# Patient Record
Sex: Female | Born: 1950
Health system: Southern US, Community
[De-identification: ages and names within clinical notes are randomized; demographics above are authoritative.]

## PROBLEM LIST (undated history)

## (undated) DIAGNOSIS — R0602 Shortness of breath: Secondary | ICD-10-CM

## (undated) DIAGNOSIS — M720 Palmar fascial fibromatosis [Dupuytren]: Secondary | ICD-10-CM

## (undated) DIAGNOSIS — R05 Cough: Secondary | ICD-10-CM

## (undated) DIAGNOSIS — F419 Anxiety disorder, unspecified: Secondary | ICD-10-CM

## (undated) DIAGNOSIS — H3530 Unspecified macular degeneration: Secondary | ICD-10-CM

## (undated) DIAGNOSIS — T7840XA Allergy, unspecified, initial encounter: Secondary | ICD-10-CM

## (undated) DIAGNOSIS — K219 Gastro-esophageal reflux disease without esophagitis: Secondary | ICD-10-CM

## (undated) DIAGNOSIS — G5603 Carpal tunnel syndrome, bilateral upper limbs: Secondary | ICD-10-CM

## (undated) DIAGNOSIS — E785 Hyperlipidemia, unspecified: Secondary | ICD-10-CM

## (undated) DIAGNOSIS — I10 Essential (primary) hypertension: Secondary | ICD-10-CM

## (undated) DIAGNOSIS — E119 Type 2 diabetes mellitus without complications: Secondary | ICD-10-CM

## (undated) DIAGNOSIS — Z9889 Other specified postprocedural states: Secondary | ICD-10-CM

## (undated) DIAGNOSIS — R112 Nausea with vomiting, unspecified: Secondary | ICD-10-CM

## (undated) DIAGNOSIS — R059 Cough, unspecified: Secondary | ICD-10-CM

## (undated) DIAGNOSIS — R413 Other amnesia: Secondary | ICD-10-CM

## (undated) DIAGNOSIS — F1021 Alcohol dependence, in remission: Secondary | ICD-10-CM

## (undated) DIAGNOSIS — E039 Hypothyroidism, unspecified: Secondary | ICD-10-CM

## (undated) DIAGNOSIS — F329 Major depressive disorder, single episode, unspecified: Secondary | ICD-10-CM

## (undated) HISTORY — DX: Cough, unspecified: R05.9

## (undated) HISTORY — PX: EYE SURGERY: SHX253

## (undated) HISTORY — PX: TONSILLECTOMY: SUR1361

## (undated) HISTORY — PX: NECK SURGERY: SHX720

## (undated) HISTORY — DX: Unspecified macular degeneration: H35.30

## (undated) HISTORY — DX: Allergy, unspecified, initial encounter: T78.40XA

## (undated) HISTORY — PX: UPPER GASTROINTESTINAL ENDOSCOPY: SHX188

## (undated) HISTORY — DX: Shortness of breath: R06.02

## (undated) HISTORY — DX: Hyperlipidemia, unspecified: E78.5

## (undated) HISTORY — PX: BACK SURGERY: SHX140

## (undated) HISTORY — PX: ECTOPIC PREGNANCY SURGERY: SHX613

## (undated) HISTORY — PX: ESOPHAGEAL DILATION: SHX303

## (undated) HISTORY — PX: CATARACT EXTRACTION: SUR2

## (undated) HISTORY — DX: Gastro-esophageal reflux disease without esophagitis: K21.9

## (undated) HISTORY — DX: Cough: R05

---

## 1977-07-16 HISTORY — PX: ECTOPIC PREGNANCY SURGERY: SHX613

## 1978-07-16 DIAGNOSIS — K219 Gastro-esophageal reflux disease without esophagitis: Secondary | ICD-10-CM

## 1978-07-16 HISTORY — DX: Gastro-esophageal reflux disease without esophagitis: K21.9

## 1988-07-16 DIAGNOSIS — E785 Hyperlipidemia, unspecified: Secondary | ICD-10-CM

## 1988-07-16 HISTORY — DX: Hyperlipidemia, unspecified: E78.5

## 1996-07-16 DIAGNOSIS — E119 Type 2 diabetes mellitus without complications: Secondary | ICD-10-CM

## 1996-07-16 DIAGNOSIS — F32A Depression, unspecified: Secondary | ICD-10-CM

## 1996-07-16 DIAGNOSIS — E039 Hypothyroidism, unspecified: Secondary | ICD-10-CM

## 1996-07-16 HISTORY — DX: Depression, unspecified: F32.A

## 1996-07-16 HISTORY — DX: Type 2 diabetes mellitus without complications: E11.9

## 1996-07-16 HISTORY — DX: Hypothyroidism, unspecified: E03.9

## 2001-07-16 DIAGNOSIS — F419 Anxiety disorder, unspecified: Secondary | ICD-10-CM

## 2001-07-16 HISTORY — DX: Anxiety disorder, unspecified: F41.9

## 2005-07-16 DIAGNOSIS — I82409 Acute embolism and thrombosis of unspecified deep veins of unspecified lower extremity: Secondary | ICD-10-CM

## 2005-07-16 HISTORY — PX: COLON SURGERY: SHX602

## 2005-07-16 HISTORY — DX: Acute embolism and thrombosis of unspecified deep veins of unspecified lower extremity: I82.409

## 2007-01-30 ENCOUNTER — Other Ambulatory Visit: Admission: RE | Admit: 2007-01-30 | Discharge: 2007-01-30 | Payer: Self-pay | Admitting: Family Medicine

## 2008-02-02 ENCOUNTER — Other Ambulatory Visit: Admission: RE | Admit: 2008-02-02 | Discharge: 2008-02-02 | Payer: Self-pay | Admitting: Family Medicine

## 2009-02-11 ENCOUNTER — Emergency Department (HOSPITAL_COMMUNITY): Admission: EM | Admit: 2009-02-11 | Discharge: 2009-02-11 | Payer: Self-pay | Admitting: Emergency Medicine

## 2009-08-18 ENCOUNTER — Encounter: Admission: RE | Admit: 2009-08-18 | Discharge: 2009-08-18 | Payer: Self-pay | Admitting: Internal Medicine

## 2010-02-20 ENCOUNTER — Emergency Department (HOSPITAL_COMMUNITY): Admission: EM | Admit: 2010-02-20 | Discharge: 2010-02-20 | Payer: Self-pay | Admitting: Family Medicine

## 2010-09-29 LAB — URINE CULTURE
Colony Count: >=100000
Culture  Setup Time: 201108082310

## 2010-09-29 LAB — CBC
Hemoglobin: 14.4 g/dL (ref 12.0–15.0)
MCH: 30.1 pg (ref 26.0–34.0)
MCHC: 34.7 g/dL (ref 30.0–36.0)
MCV: 86.6 fL (ref 78.0–100.0)
RBC: 4.79 MIL/uL (ref 3.87–5.11)
WBC: 6.7 10*3/uL (ref 4.0–10.5)

## 2010-09-29 LAB — POCT URINALYSIS DIPSTICK
Glucose, UA: 250 mg/dL — AB
Ketones, ur: NEGATIVE mg/dL
Specific Gravity, Urine: 1.01 (ref 1.005–1.030)
Urobilinogen, UA: 0.2 mg/dL (ref 0.0–1.0)

## 2010-09-29 LAB — POCT I-STAT, CHEM 8
Calcium, Ion: 1.18 mmol/L (ref 1.12–1.32)
Glucose, Bld: 158 mg/dL — ABNORMAL HIGH (ref 70–99)
Sodium: 139 mEq/L (ref 135–145)

## 2010-09-29 LAB — DIFFERENTIAL
Eosinophils Absolute: 0.1 10*3/uL (ref 0.0–0.7)
Eosinophils Relative: 1 % (ref 0–5)
Monocytes Absolute: 0.6 10*3/uL (ref 0.1–1.0)

## 2011-07-02 ENCOUNTER — Other Ambulatory Visit (HOSPITAL_COMMUNITY)
Admission: RE | Admit: 2011-07-02 | Discharge: 2011-07-02 | Disposition: A | Discharge status: Home / Self Care | Payer: No Typology Code available for payment source | Source: Ambulatory Visit | Attending: Family Medicine | Admitting: Family Medicine

## 2011-07-02 ENCOUNTER — Other Ambulatory Visit: Payer: Self-pay | Admitting: Family Medicine

## 2011-07-02 DIAGNOSIS — Z0000 Encounter for general adult medical examination without abnormal findings: Secondary | ICD-10-CM | POA: Insufficient documentation

## 2012-07-16 LAB — HM PAP SMEAR

## 2012-07-16 LAB — HM COLONOSCOPY

## 2013-05-28 ENCOUNTER — Other Ambulatory Visit: Payer: Self-pay | Admitting: Orthopaedic Surgery

## 2013-05-28 DIAGNOSIS — M545 Low back pain, unspecified: Secondary | ICD-10-CM

## 2013-06-09 ENCOUNTER — Other Ambulatory Visit: Payer: No Typology Code available for payment source

## 2013-06-18 ENCOUNTER — Ambulatory Visit
Admission: RE | Admit: 2013-06-18 | Discharge: 2013-06-18 | Disposition: A | Discharge status: Home / Self Care | Payer: Medicare Other | Source: Ambulatory Visit | Attending: Orthopaedic Surgery | Admitting: Orthopaedic Surgery

## 2013-06-18 DIAGNOSIS — M545 Low back pain, unspecified: Secondary | ICD-10-CM

## 2013-06-29 ENCOUNTER — Other Ambulatory Visit: Payer: Self-pay | Admitting: Family Medicine

## 2013-06-29 DIAGNOSIS — N924 Excessive bleeding in the premenopausal period: Secondary | ICD-10-CM

## 2013-07-01 ENCOUNTER — Ambulatory Visit
Admission: RE | Admit: 2013-07-01 | Discharge: 2013-07-01 | Disposition: A | Discharge status: Home / Self Care | Payer: Medicare Other | Source: Ambulatory Visit | Attending: Family Medicine | Admitting: Family Medicine

## 2013-07-01 DIAGNOSIS — N924 Excessive bleeding in the premenopausal period: Secondary | ICD-10-CM

## 2013-08-03 ENCOUNTER — Encounter (HOSPITAL_COMMUNITY): Payer: Self-pay | Admitting: Pharmacist

## 2013-08-12 ENCOUNTER — Encounter (INDEPENDENT_AMBULATORY_CARE_PROVIDER_SITE_OTHER): Payer: Self-pay

## 2013-08-12 ENCOUNTER — Encounter (HOSPITAL_COMMUNITY)
Admission: RE | Admit: 2013-08-12 | Discharge: 2013-08-12 | Disposition: A | Discharge status: Home / Self Care | Payer: Medicare Other | Source: Ambulatory Visit | Attending: Obstetrics & Gynecology | Admitting: Obstetrics & Gynecology

## 2013-08-12 ENCOUNTER — Encounter (HOSPITAL_COMMUNITY): Payer: Self-pay

## 2013-08-12 DIAGNOSIS — Z01812 Encounter for preprocedural laboratory examination: Secondary | ICD-10-CM | POA: Insufficient documentation

## 2013-08-12 DIAGNOSIS — Z01818 Encounter for other preprocedural examination: Secondary | ICD-10-CM | POA: Insufficient documentation

## 2013-08-12 HISTORY — DX: Hypothyroidism, unspecified: E03.9

## 2013-08-12 HISTORY — DX: Type 2 diabetes mellitus without complications: E11.9

## 2013-08-12 HISTORY — DX: Anxiety disorder, unspecified: F41.9

## 2013-08-12 HISTORY — DX: Other specified postprocedural states: Z98.890

## 2013-08-12 HISTORY — DX: Other specified postprocedural states: R11.2

## 2013-08-12 HISTORY — DX: Major depressive disorder, single episode, unspecified: F32.9

## 2013-08-12 LAB — BASIC METABOLIC PANEL
BUN: 11 mg/dL (ref 6–23)
CO2: 29 mEq/L (ref 19–32)
Calcium: 9.3 mg/dL (ref 8.4–10.5)
Chloride: 101 mEq/L (ref 96–112)
Creatinine, Ser: 0.61 mg/dL (ref 0.50–1.10)
GLUCOSE: 198 mg/dL — AB (ref 70–99)
Potassium: 4.8 mEq/L (ref 3.7–5.3)
SODIUM: 141 meq/L (ref 137–147)

## 2013-08-12 LAB — CBC
HCT: 37.9 % (ref 36.0–46.0)
Hemoglobin: 12.5 g/dL (ref 12.0–15.0)
MCH: 28 pg (ref 26.0–34.0)
MCHC: 33 g/dL (ref 30.0–36.0)
MCV: 84.8 fL (ref 78.0–100.0)
Platelets: 193 10*3/uL (ref 150–400)
RBC: 4.47 MIL/uL (ref 3.87–5.11)
RDW: 14.9 % (ref 11.5–15.5)
WBC: 4.6 10*3/uL (ref 4.0–10.5)

## 2013-08-12 NOTE — Pre-Procedure Instructions (Signed)
Pt participates in a Diabetic study of some sort. States she had recent "normal" EKG. She will either bring copy on OR day or ask them to fax to me (she can't remember exact name of facility).

## 2013-08-12 NOTE — Pre-Procedure Instructions (Signed)
Patient BS 198 today, she had an appt with Diabetic study immediately after PAT this AM.

## 2013-08-12 NOTE — Patient Instructions (Signed)
20 Michaela CornerJudith M Thompson  08/12/2013   Your procedure is scheduled on:  08/17/13  Enter through the [facility]tr[facility] of [facility] at 845 AM.  Pick up the phone at the desk and dial [phone].   Call this number if you have problems the morning of surgery: [phone]   Remember:   Do not eat food:After Midnight.  Do not drink clear liquids: After Midnight.  Take these medicines the morning of surgery with A SIP OF WATER: hold Metformin 24hrs prior to surgery. Take Pepcid, Synthroid, may take Zoloft   Do not wear jewelry, make-up or nail polish.  Do not wear lotions, powders, or perfumes. You may wear deodorant.  Do not shave 48 hours prior to surgery.  Do not bring valuables to the hospital.  [facility] is not   responsible for any belongings or valuables brought to the hospital.  Contacts, dentures or bridgework may not be worn into surgery.  Leave suitcase in the car. After surgery it may be brought to your room.  For patients admitted to the hospital, checkout time is 11:00 AM the day of              discharge.   Patients discharged the day of surgery will not be allowed to drive             home.  Name and phone number of your driver: friend  Marissa AcreLynn Thompson  Special Instructions:   Shower using CHG 2 nights before surgery and the night before surgery.  If you shower the day of surgery use CHG.  Use special wash - you have one bottle of CHG for all showers.  You should use approximately 1/3 of the bottle for each shower.   Please read over the following fact sheets that you were given:   Surgical Site Infection Prevention

## 2013-08-13 ENCOUNTER — Other Ambulatory Visit: Payer: Self-pay | Admitting: Rehabilitation

## 2013-08-13 DIAGNOSIS — M5412 Radiculopathy, cervical region: Secondary | ICD-10-CM

## 2013-08-15 NOTE — H&P (Signed)
Marissa Thompson is an 63 y.o. female. She is admitted for evaluation of PMB and thickened endometrium on ultrasound.  Pertinent Gynecological History: Bleeding: Single episode of PMB around 1 month PTA  OB History: G1, P1   Menstrual History: No LMP recorded.    Past Medical History  Diagnosis Date  . PONV (postoperative nausea and vomiting)   . Diabetes mellitus without complication   . Depression   . Anxiety   . Hypothyroidism     Past Surgical History  Procedure Laterality Date  . Colon surgery  2007    benign mass  . Tonsillectomy    . Ectopic pregnancy surgery    . Back surgery      x3  . Esophageal dilation      x3    No family history on file.  Social History:  reports that she has never smoked. She does not have any smokeless tobacco history on file. She reports that she does not drink alcohol or use illicit drugs.  Allergies:  Allergies  Allergen Reactions  . Morphine And Related Itching  . Talwin [Pentazocine] Itching  . Topamax [Topiramate]     Stroke like side effects, numbness, drooping in face.  Marland Kitchen. Penicillins Rash    No prescriptions prior to admission    Review of Systems  Constitutional: Negative.   HENT: Negative.   Respiratory: Negative.   Skin: Negative.   Psychiatric/Behavioral: Negative.     There were no vitals taken for this visit. Physical Exam  Constitutional: She appears well-nourished.  HENT:  Head: Normocephalic.  Eyes: Pupils are equal, round, and reactive to light.  Neck: Normal range of motion.  Cardiovascular: Normal rate and regular rhythm.   Respiratory: Effort normal.  GI: Soft.  Genitourinary: Vagina normal and uterus normal.  Musculoskeletal: Normal range of motion.  Neurological: She is alert.  Skin: Skin is warm.    No results found for this or any previous visit (from the past 24 hour(s)).  No results found.  Assessment/Plan: Episode of post menopausal bleeding and thickened endometrium (1.5 cm) on  ultrasound.  Probably endometrial polyp but must rule out endometrial cancer.  Will proceed with hysteroscopy and sampling/removal of endometrial pathology.  Manvi Guilliams D 08/15/2013, 10:04 AM

## 2013-08-17 ENCOUNTER — Ambulatory Visit (HOSPITAL_COMMUNITY): Payer: Medicare Other | Admitting: Anesthesiology

## 2013-08-17 ENCOUNTER — Encounter (HOSPITAL_COMMUNITY): Payer: Medicare Other | Admitting: Anesthesiology

## 2013-08-17 ENCOUNTER — Encounter (HOSPITAL_COMMUNITY)
Admission: RE | Disposition: A | Discharge status: Home / Self Care | Payer: Self-pay | Source: Ambulatory Visit | Attending: Obstetrics & Gynecology

## 2013-08-17 ENCOUNTER — Ambulatory Visit (HOSPITAL_COMMUNITY)
Admission: RE | Admit: 2013-08-17 | Discharge: 2013-08-17 | Disposition: A | Discharge status: Home / Self Care | Payer: Medicare Other | Source: Ambulatory Visit | Attending: Obstetrics & Gynecology | Admitting: Obstetrics & Gynecology

## 2013-08-17 DIAGNOSIS — E119 Type 2 diabetes mellitus without complications: Secondary | ICD-10-CM | POA: Insufficient documentation

## 2013-08-17 DIAGNOSIS — N950 Postmenopausal bleeding: Secondary | ICD-10-CM | POA: Insufficient documentation

## 2013-08-17 DIAGNOSIS — E039 Hypothyroidism, unspecified: Secondary | ICD-10-CM | POA: Insufficient documentation

## 2013-08-17 DIAGNOSIS — R9389 Abnormal findings on diagnostic imaging of other specified body structures: Secondary | ICD-10-CM | POA: Insufficient documentation

## 2013-08-17 DIAGNOSIS — N840 Polyp of corpus uteri: Secondary | ICD-10-CM | POA: Insufficient documentation

## 2013-08-17 HISTORY — PX: HYSTEROSCOPY WITH D & C: SHX1775

## 2013-08-17 HISTORY — PX: CERVICAL POLYPECTOMY: SHX88

## 2013-08-17 LAB — GLUCOSE, CAPILLARY
GLUCOSE-CAPILLARY: 96 mg/dL (ref 70–99)
Glucose-Capillary: 137 mg/dL — ABNORMAL HIGH (ref 70–99)

## 2013-08-17 SURGERY — DILATATION AND CURETTAGE /HYSTEROSCOPY
Anesthesia: General | Site: Uterus

## 2013-08-17 MED ORDER — MIDAZOLAM HCL 2 MG/2ML IJ SOLN
INTRAMUSCULAR | Status: AC
  Filled 2013-08-17: qty 2

## 2013-08-17 MED ORDER — MIDAZOLAM HCL 2 MG/2ML IJ SOLN: 0.5000 mg | Freq: Once | INTRAMUSCULAR | Status: DC | PRN

## 2013-08-17 MED ORDER — LIDOCAINE HCL (CARDIAC) 20 MG/ML IV SOLN
INTRAVENOUS | Status: DC | PRN
  Administered 2013-08-17: 50 mg via INTRAVENOUS

## 2013-08-17 MED ORDER — METOCLOPRAMIDE HCL 5 MG/ML IJ SOLN
INTRAMUSCULAR | Status: DC | PRN
  Administered 2013-08-17: 10 mg via INTRAVENOUS

## 2013-08-17 MED ORDER — KETOROLAC TROMETHAMINE 30 MG/ML IJ SOLN
INTRAMUSCULAR | Status: AC
  Filled 2013-08-17: qty 1

## 2013-08-17 MED ORDER — PROMETHAZINE HCL 25 MG/ML IJ SOLN: 6.2500 mg | INTRAMUSCULAR | Status: DC | PRN

## 2013-08-17 MED ORDER — METOCLOPRAMIDE HCL 5 MG/ML IJ SOLN
INTRAMUSCULAR | Status: AC
Start: 2013-08-17 — End: 2013-08-17
  Filled 2013-08-17: qty 2

## 2013-08-17 MED ORDER — PROPOFOL 10 MG/ML IV BOLUS
INTRAVENOUS | Status: DC | PRN
  Administered 2013-08-17: 170 mg via INTRAVENOUS

## 2013-08-17 MED ORDER — MEPERIDINE HCL 25 MG/ML IJ SOLN: 6.2500 mg | INTRAMUSCULAR | Status: DC | PRN

## 2013-08-17 MED ORDER — LIDOCAINE HCL 2 % IJ SOLN
INTRAMUSCULAR | Status: DC | PRN
  Administered 2013-08-17: 10 mL

## 2013-08-17 MED ORDER — FENTANYL CITRATE 0.05 MG/ML IJ SOLN
INTRAMUSCULAR | Status: AC
  Filled 2013-08-17: qty 5

## 2013-08-17 MED ORDER — ONDANSETRON HCL 4 MG/2ML IJ SOLN
INTRAMUSCULAR | Status: DC | PRN
  Administered 2013-08-17: 4 mg via INTRAVENOUS

## 2013-08-17 MED ORDER — FENTANYL CITRATE 0.05 MG/ML IJ SOLN: 25.0000 ug | INTRAMUSCULAR | Status: DC | PRN

## 2013-08-17 MED ORDER — FENTANYL CITRATE 0.05 MG/ML IJ SOLN
INTRAMUSCULAR | Status: DC | PRN
  Administered 2013-08-17: 100 ug via INTRAVENOUS

## 2013-08-17 MED ORDER — LIDOCAINE HCL 2 % IJ SOLN
INTRAMUSCULAR | Status: AC
  Filled 2013-08-17: qty 20

## 2013-08-17 MED ORDER — MIDAZOLAM HCL 2 MG/2ML IJ SOLN
INTRAMUSCULAR | Status: DC | PRN
  Administered 2013-08-17: 2 mg via INTRAVENOUS

## 2013-08-17 MED ORDER — DEXAMETHASONE SODIUM PHOSPHATE 10 MG/ML IJ SOLN
INTRAMUSCULAR | Status: AC
  Filled 2013-08-17: qty 1

## 2013-08-17 MED ORDER — ONDANSETRON HCL 4 MG/2ML IJ SOLN
INTRAMUSCULAR | Status: AC
  Filled 2013-08-17: qty 2

## 2013-08-17 MED ORDER — PROPOFOL 10 MG/ML IV EMUL
INTRAVENOUS | Status: AC
  Filled 2013-08-17: qty 20

## 2013-08-17 MED ORDER — KETOROLAC TROMETHAMINE 30 MG/ML IJ SOLN: 15.0000 mg | Freq: Once | INTRAMUSCULAR | Status: DC | PRN

## 2013-08-17 MED ORDER — LIDOCAINE HCL (CARDIAC) 20 MG/ML IV SOLN
INTRAVENOUS | Status: AC
  Filled 2013-08-17: qty 5

## 2013-08-17 MED ORDER — LACTATED RINGERS IV SOLN
INTRAVENOUS | Status: DC
  Administered 2013-08-17 (×2): via INTRAVENOUS

## 2013-08-17 SURGICAL SUPPLY — 15 items
CANISTER SUCT 3000ML (MISCELLANEOUS) ×3 IMPLANT
CATH ROBINSON RED A/P 16FR (CATHETERS) IMPLANT
CLOTH BEACON ORANGE TIMEOUT ST (SAFETY) ×3 IMPLANT
CONTAINER PREFILL 10% NBF 60ML (FORM) ×6 IMPLANT
DRSG TELFA 3X8 NADH (GAUZE/BANDAGES/DRESSINGS) ×3 IMPLANT
ELECT REM PT RETURN 9FT ADLT (ELECTROSURGICAL)
ELECTRODE REM PT RTRN 9FT ADLT (ELECTROSURGICAL) IMPLANT
GLOVE ECLIPSE 6.0 STRL STRAW (GLOVE) ×6 IMPLANT
GOWN STRL REUS W/TWL LRG LVL3 (GOWN DISPOSABLE) ×6 IMPLANT
LOOP ANGLED CUTTING 22FR (CUTTING LOOP) IMPLANT
PACK HYSTEROSCOPY LF (CUSTOM PROCEDURE TRAY) ×3 IMPLANT
PAD OB MATERNITY 4.3X12.25 (PERSONAL CARE ITEMS) ×3 IMPLANT
PAD PREP 24X48 CUFFED NSTRL (MISCELLANEOUS) ×3 IMPLANT
TOWEL OR 17X24 6PK STRL BLUE (TOWEL DISPOSABLE) ×6 IMPLANT
WATER STERILE IRR 1000ML POUR (IV SOLUTION) ×3 IMPLANT

## 2013-08-17 NOTE — Op Note (Signed)
Patient Name: Marissa Thompson MRN: [id number]  Date of Surgery: 08/17/2013    PREOPERATIVE DIAGNOSIS: POSTMENOPAUSAL BLEEDING / ENDOMETRIAL POLYP  POSTOPERATIVE DIAGNOSIS: POSTMENOPAUSAL BLEEDING / ENDOMETRIAL POLYPS   PROCEDURE: Hysteroscopy; endometrial polypectomy  SURGEON: Caralyn Guileichard D. Arlyce DiceKaplan M.D.  ANESTHESIA: General, para cervical block  ESTIMATED BLOOD LOSS: Minimal  FINDINGS: Two, 2 cm endometrial polyps identified on hysteroscopy and removed with polyp forceps   INDICATIONS: Endometrial mass noted on MRI (done for back pain) and confirmed with ultrasound.  Patient has had a single episode of post menopausal bleeding.  PROCEDURE IN DETAIL: The patient was taken to the OR and placed in the dors-lithotomy position. The perineum and vagina were prepped and draped in a sterile fashion. Bimanual exam revealed an anteverted,normal sized uterus. 10 ml of 2% lidocaine was infiltrated in the paracervical tissue.  The external os was stenosed and opened with a hemostat.  Pratt dilators were used to open the cervix to 21 [place]. The hysteroscope was introduced and endometrial polyps were identified.  The endometrium was otherwise atrophic.  Polyp forceps were introduced and the polyps were grasped and easily removed.  The hysteroscope was reintroduced and complete removal of the polyps was confirmed.  The procedure was then terminated and the patient left the operating room in good condition.

## 2013-08-17 NOTE — Anesthesia Postprocedure Evaluation (Signed)
  Anesthesia Post Note  Patient: Marissa Thompson  Procedure(s) Performed: Procedure(s) (LRB): DILATATION AND CURETTAGE /HYSTEROSCOPY (N/A) CERVICAL POLYPECTOMY (N/A)  Anesthesia type: GA  Patient location: PACU  Post pain: Pain level controlled  Post assessment: Post-op Vital signs reviewed  Last Vitals:  Filed Vitals:   08/17/13 1030  BP: 103/58  Pulse: 93  Temp: 36.3 C  Resp: 20    Post vital signs: Reviewed  Level of consciousness: sedated  Complications: No apparent anesthesia complications

## 2013-08-17 NOTE — Discharge Instructions (Signed)
Dilation and Curettage or Vacuum Curettage, Care After  Refer to this sheet in the next few weeks. These instructions provide you with information on caring for yourself after your procedure. Your health care provider may also give you more specific instructions. Your treatment has been planned according to current medical practices, but problems sometimes occur. Call your health care provider if you have any problems or questions after your procedure.  WHAT TO EXPECT AFTER THE PROCEDURE  After your procedure, it is typical to have light cramping and bleeding. This may last for 2 days to 2 weeks after the procedure.  HOME CARE INSTRUCTIONS   · Do not drive for 24 hours.  · Wait 1 week before returning to strenuous activities.  · Take your temperature 2 times a day for 4 days and write it down. Provide these temperatures to your health care provider if you develop a fever.  · Avoid long periods of standing.  · Avoid heavy lifting, pushing, or pulling. Do not lift anything heavier than 10 pounds (4.5 kg).  · Limit stair climbing to once or twice a day.  · Take rest periods often.  · You may resume your usual diet.  · Drink enough fluids to keep your urine clear or pale yellow.  · Your usual bowel function should return. If you have constipation, you may:  · Take a mild laxative with permission from your health care provider.  · Add fruit and bran to your diet.  · Drink more fluids.  · Take showers instead of baths until your health care provider gives you permission to take baths.  · Do not go swimming or use a hot tub until your health care provider approves.  · Try to have someone with you or available to you the first 24 48 hours, especially if you were given a general anesthetic.  · Do not douche, use tampons, or have intercourse for 2 weeks after the procedure.  · Only take over-the-counter or prescription medicines as directed by your health care provider. Do not take aspirin. It can cause bleeding.  · Follow up  with your health care provider as directed.  SEEK MEDICAL CARE IF:   · You have increasing cramps or pain that is not relieved with medicine.  · You have abdominal pain that does not seem to be related to the same area of earlier cramping and pain.  · You have bad smelling vaginal discharge.  · You have a rash.  · You are having problems with any medicine.  SEEK IMMEDIATE MEDICAL CARE IF:   · You have bleeding that is heavier than a normal menstrual period.  · You have a fever.  · You have chest pain.  · You have shortness of breath.  · You feel dizzy or feel like fainting.  · You pass out.  · You have pain in your shoulder strap area.  · You have heavy vaginal bleeding with or without blood clots.  Document Released: 06/29/2000 Document Revised: 04/22/2013 Document Reviewed: 01/29/2013  [employer]® Patient Information ©2014 [employer].

## 2013-08-17 NOTE — Progress Notes (Signed)
I have interviewed and performed the pertinent exams on my patient to confirm that there have been no significant changes in her condition since the dictation of her history and physical exam.  

## 2013-08-17 NOTE — Anesthesia Preprocedure Evaluation (Signed)
Anesthesia Evaluation  Patient identified by MRN, date of birth, ID band Patient awake    Reviewed: Allergy & Precautions, H&P , Patient's Chart, lab work & pertinent test results, reviewed documented beta blocker date and time   History of Anesthesia Complications (+) PONV and history of anesthetic complications  Airway Mallampati: II TM Distance: >3 FB Neck ROM: full    Dental   Pulmonary  breath sounds clear to auscultation        Cardiovascular Exercise Tolerance: Good Rhythm:regular Rate:Normal     Neuro/Psych PSYCHIATRIC DISORDERS Anxiety Depression negative psych ROS   GI/Hepatic   Endo/Other  diabetesHypothyroidism   Renal/GU      Musculoskeletal   Abdominal   Peds  Hematology   Anesthesia Other Findings   Reproductive/Obstetrics                           Anesthesia Physical Anesthesia Plan  ASA: III  Anesthesia Plan: General LMA   Post-op Pain Management:    Induction:   Airway Management Planned:   Additional Equipment:   Intra-op Plan:   Post-operative Plan:   Informed Consent: I have reviewed the patients History and Physical, chart, labs and discussed the procedure including the risks, benefits and alternatives for the proposed anesthesia with the patient or authorized representative who has indicated his/her understanding and acceptance.   Dental Advisory Given  Plan Discussed with: CRNA, Surgeon and Anesthesiologist  Anesthesia Plan Comments:         Anesthesia Quick Evaluation

## 2013-08-17 NOTE — Transfer of Care (Signed)
Immediate Anesthesia Transfer of Care Note  Patient: Marissa CornerJudith M Saksa  Procedure(s) Performed: Procedure(s) with comments: DILATATION AND CURETTAGE /HYSTEROSCOPY (N/A) - YAG LASER CERVICAL POLYPECTOMY (N/A)  Patient Location: PACU  Anesthesia Type:General  Level of Consciousness: awake  Airway & Oxygen Therapy: Patient Spontanous Breathing  Post-op Assessment: Report given to PACU RN  Post vital signs: stable  Filed Vitals:   08/17/13 0853  BP: 129/50  Pulse: 87  Temp: 36.8 C  Resp: 20    Complications: No apparent anesthesia complications

## 2013-08-18 ENCOUNTER — Encounter (HOSPITAL_COMMUNITY): Payer: Self-pay | Admitting: Obstetrics & Gynecology

## 2013-08-19 ENCOUNTER — Ambulatory Visit
Admission: RE | Admit: 2013-08-19 | Discharge: 2013-08-19 | Disposition: A | Discharge status: Home / Self Care | Payer: Medicare Other | Source: Ambulatory Visit | Attending: Rehabilitation | Admitting: Rehabilitation

## 2013-08-19 DIAGNOSIS — M5412 Radiculopathy, cervical region: Secondary | ICD-10-CM

## 2013-09-18 ENCOUNTER — Emergency Department (HOSPITAL_COMMUNITY)
Admission: EM | Admit: 2013-09-18 | Discharge: 2013-09-18 | Disposition: A | Discharge status: Home / Self Care | Payer: Medicare Other | Attending: Emergency Medicine | Admitting: Emergency Medicine

## 2013-09-18 ENCOUNTER — Encounter (HOSPITAL_COMMUNITY): Payer: Self-pay | Admitting: Emergency Medicine

## 2013-09-18 DIAGNOSIS — M79609 Pain in unspecified limb: Secondary | ICD-10-CM

## 2013-09-18 DIAGNOSIS — Z86718 Personal history of other venous thrombosis and embolism: Secondary | ICD-10-CM | POA: Insufficient documentation

## 2013-09-18 DIAGNOSIS — E039 Hypothyroidism, unspecified: Secondary | ICD-10-CM | POA: Insufficient documentation

## 2013-09-18 DIAGNOSIS — Z791 Long term (current) use of non-steroidal anti-inflammatories (NSAID): Secondary | ICD-10-CM | POA: Insufficient documentation

## 2013-09-18 DIAGNOSIS — Z880 Allergy status to penicillin: Secondary | ICD-10-CM | POA: Insufficient documentation

## 2013-09-18 DIAGNOSIS — F3289 Other specified depressive episodes: Secondary | ICD-10-CM | POA: Insufficient documentation

## 2013-09-18 DIAGNOSIS — Z9889 Other specified postprocedural states: Secondary | ICD-10-CM | POA: Insufficient documentation

## 2013-09-18 DIAGNOSIS — F329 Major depressive disorder, single episode, unspecified: Secondary | ICD-10-CM | POA: Insufficient documentation

## 2013-09-18 DIAGNOSIS — F411 Generalized anxiety disorder: Secondary | ICD-10-CM | POA: Insufficient documentation

## 2013-09-18 DIAGNOSIS — M79604 Pain in right leg: Secondary | ICD-10-CM

## 2013-09-18 DIAGNOSIS — Z7982 Long term (current) use of aspirin: Secondary | ICD-10-CM | POA: Insufficient documentation

## 2013-09-18 DIAGNOSIS — Z79899 Other long term (current) drug therapy: Secondary | ICD-10-CM | POA: Insufficient documentation

## 2013-09-18 DIAGNOSIS — E119 Type 2 diabetes mellitus without complications: Secondary | ICD-10-CM | POA: Insufficient documentation

## 2013-09-18 LAB — PROTIME-INR
INR: 0.98 (ref 0.00–1.49)
PROTHROMBIN TIME: 12.8 s (ref 11.6–15.2)

## 2013-09-18 LAB — BASIC METABOLIC PANEL
BUN: 9 mg/dL (ref 6–23)
CALCIUM: 9.1 mg/dL (ref 8.4–10.5)
CO2: 26 mEq/L (ref 19–32)
Chloride: 100 mEq/L (ref 96–112)
Creatinine, Ser: 0.56 mg/dL (ref 0.50–1.10)
GLUCOSE: 153 mg/dL — AB (ref 70–99)
POTASSIUM: 3.9 meq/L (ref 3.7–5.3)
SODIUM: 140 meq/L (ref 137–147)

## 2013-09-18 LAB — CBC
HCT: 37.8 % (ref 36.0–46.0)
Hemoglobin: 12.5 g/dL (ref 12.0–15.0)
MCH: 28.7 pg (ref 26.0–34.0)
MCHC: 33.1 g/dL (ref 30.0–36.0)
MCV: 86.9 fL (ref 78.0–100.0)
PLATELETS: 235 10*3/uL (ref 150–400)
RBC: 4.35 MIL/uL (ref 3.87–5.11)
RDW: 15.1 % (ref 11.5–15.5)
WBC: 6.7 10*3/uL (ref 4.0–10.5)

## 2013-09-18 MED ORDER — DIPHENHYDRAMINE HCL 25 MG PO CAPS
25.0000 mg | ORAL_CAPSULE | Freq: Once | ORAL | Status: AC
  Administered 2013-09-18: 25 mg via ORAL
  Filled 2013-09-18: qty 1

## 2013-09-18 MED ORDER — OXYCODONE HCL 5 MG PO TABS
5.0000 mg | ORAL_TABLET | Freq: Once | ORAL | Status: AC
  Administered 2013-09-18: 5 mg via ORAL
  Filled 2013-09-18: qty 1

## 2013-09-18 MED ORDER — OXYCODONE-ACETAMINOPHEN 5-325 MG PO TABS
1.0000 | ORAL_TABLET | Freq: Once | ORAL | Status: AC
  Administered 2013-09-18: 1 via ORAL
  Filled 2013-09-18: qty 1

## 2013-09-18 MED ORDER — MORPHINE SULFATE 4 MG/ML IJ SOLN
4.0000 mg | Freq: Once | INTRAMUSCULAR | Status: DC
  Filled 2013-09-18: qty 1

## 2013-09-18 NOTE — ED Notes (Signed)
Pt arrives via POV from home. States she had neck surgery on Tues. Today developed sharp pain to RLE. Pt reports hx of blood clot. States this pain feels similar to last blood clot.

## 2013-09-18 NOTE — ED Notes (Signed)
Started to insert IV to administer morphine, but Lauren PA-C came to prepare discharge and will change medication from IV to a PO medication.

## 2013-09-18 NOTE — Progress Notes (Signed)
*  PRELIMINARY RESULTS* Vascular Ultrasound Right lower extremity venous duplex has been completed.  Preliminary findings: No evidence of DVT.   Farrel DemarkJill Eunice, RDMS, RVT  09/18/2013, 7:24 PM

## 2013-09-18 NOTE — ED Notes (Signed)
PA at bedside.

## 2013-09-18 NOTE — ED Provider Notes (Signed)
CSN: [id number]     Arrival date & time 09/18/13  1414 History   First MD Initiated Contact with Patient 09/18/13 1755     Chief Complaint  Patient presents with  . Leg Pain     (Consider location/radiation/quality/duration/timing/severity/associated sxs/prior Treatment) HPI Comments: Marissa Thompson is a 63 y.o. female with a past medical history of DVT, DM,Anxiety, presenting the Emergency Department with a chief complaint of Right calf pain for 1 day.  She reports sharp leg to the right calf worsened by ambulation and movement.  She denies swelling or a history of trauma.  She reports she had a cervical procedure, performed by Dr. Sharolyn Douglas, 3 days ago.  She reports similar pain in the past with a previous DVT on the Left leg.  She states the DVT was found after 5 long flights in a short period of time, she was on coumadin for 6 months and has not had further DVTs since. PCP: Cala Bradford, MD   The history is provided by the patient. No language interpreter was used.    Past Medical History  Diagnosis Date  . PONV (postoperative nausea and vomiting)   . Diabetes mellitus without complication   . Depression   . Anxiety   . Hypothyroidism    Past Surgical History  Procedure Laterality Date  . Colon surgery  2007    benign mass  . Tonsillectomy    . Ectopic pregnancy surgery    . Back surgery      x3  . Esophageal dilation      x3  . Hysteroscopy w/d&c N/A 08/17/2013    Procedure: DILATATION AND CURETTAGE /HYSTEROSCOPY;  Surgeon: Mickel Baas, MD;  Location: [facility];  Service: Gynecology;  Laterality: N/A;  YAG LASER  . Cervical polypectomy N/A 08/17/2013    Procedure: CERVICAL POLYPECTOMY;  Surgeon: Mickel Baas, MD;  Location: [facility];  Service: Gynecology;  Laterality: N/A;  . Neck surgery     No family history on file. History  Substance Use Topics  . Smoking status: Never Smoker   . Smokeless tobacco: Not on file  . Alcohol Use: No   OB History   Grav Para  Term Preterm Abortions TAB SAB Ect Mult Living                 Review of Systems  Constitutional: Negative for fever and chills.  Respiratory: Negative for cough and shortness of breath.   Cardiovascular: Negative for chest pain, palpitations and leg swelling.  Musculoskeletal: Positive for gait problem and myalgias. Negative for arthralgias, back pain, neck pain and neck stiffness.      Allergies  Morphine and related; Talwin; Toradol; and Penicillins  Home Medications   Current Outpatient Rx  Name  Route  Sig  Dispense  Refill  . aspirin EC 81 MG tablet   Oral   Take 81 mg by mouth daily.         . cyclobenzaprine (FLEXERIL) 10 MG tablet   Oral   Take 10 mg by mouth 3 (three) times daily.         . famotidine (PEPCID) 20 MG tablet   Oral   Take 20 mg by mouth daily.         Marland Kitchen gabapentin (NEURONTIN) 300 MG capsule   Oral   Take 300 mg by mouth 3 (three) times daily.         Marland Kitchen glimepiride (AMARYL) 2 MG tablet   Oral  Take 1 mg by mouth daily with breakfast.         . HYDROcodone-acetaminophen (NORCO) 10-325 MG per tablet   Oral   Take 1 tablet by mouth every 6 (six) hours as needed.         Marland Kitchen. ibuprofen (ADVIL,MOTRIN) 800 MG tablet   Oral   Take 800 mg by mouth 3 (three) times daily.         Marland Kitchen. levothyroxine (SYNTHROID, LEVOTHROID) 75 MCG tablet   Oral   Take 75 mcg by mouth daily before breakfast.         . metFORMIN (GLUCOPHAGE) 1000 MG tablet   Oral   Take 1,000 mg by mouth 2 (two) times daily with a meal.         . pravastatin (PRAVACHOL) 40 MG tablet   Oral   Take 40 mg by mouth daily.         . sertraline (ZOLOFT) 100 MG tablet   Oral   Take 100 mg by mouth 2 (two) times daily.          BP 157/74  Pulse 93  Temp(Src) 99.1 F (37.3 C) (Oral)  Resp 20  SpO2 100% Physical Exam  Nursing note and vitals reviewed. Constitutional: She is oriented to person, place, and time. She appears well-developed and well-nourished. No  distress.  HENT:  Head: Normocephalic and atraumatic.  Eyes: EOM are normal. Pupils are equal, round, and reactive to light. No scleral icterus.  Neck: Neck supple.    Linear healing scar, no drainage or surrounding erythema.  Cardiovascular: Normal rate, regular rhythm and normal heart sounds.   No murmur heard. Pulses:      Dorsalis pedis pulses are 2+ on the right side.  No palpable pulse in left DP, pt reports this is chronic.  Pulmonary/Chest: Effort normal and breath sounds normal. Not tachypneic. No respiratory distress. She has no decreased breath sounds. She has no wheezes. She has no rhonchi.  Patient is able to speak in complete sentences.    Abdominal: Soft. Bowel sounds are normal. There is no tenderness. There is no rebound and no guarding.  Musculoskeletal: Normal range of motion. She exhibits no edema.       Right lower leg: She exhibits tenderness. She exhibits no swelling, no edema and no deformity.       Legs: Right lower extremity tenderness to palpation. Good cap refill. God DP pulse, no obvious swelling or change in limb color.  Positive Homans sign.  Neurological: She is alert and oriented to person, place, and time.  Skin: Skin is warm and dry. No rash noted.  Psychiatric: She has a normal mood and affect.    ED Course  Procedures (including critical care time) Labs Review Labs Reviewed  BASIC METABOLIC PANEL - Abnormal; Notable for the following:    Glucose, Bld 153 (*)    All other components within normal limits  CBC  PROTIME-INR   Imaging Review Lower extremity Duplex: No evidence of deep vein thrombosis involving the right lower extremity.    EKG Interpretation None      MDM   Final diagnoses:  Right leg pain   Pt with a history of DVT presents with similar complains today.  NV intact.  Multiple risk factors for possible DVT, US duplex ordered.   Re-eval: Pt reports no relief with Percocet. US: No evidence of deep vein thrombosis  involving the right lower extremity. Labs without electrolyte abnormalities causing muscle cramping.  Will treat for pain and the patient can be managed as an out-pt. Discussed lab results, imaging results, and treatment plan with the patient. Return precautions given. Reports understanding and no other concerns at this time.  Patient is stable for discharge at this time. Meds given in ED:  Medications  oxyCODONE-acetaminophen (PERCOCET/ROXICET) 5-325 MG per tablet 1 tablet (1 tablet Oral Given 09/18/13 1738)  diphenhydrAMINE (BENADRYL) capsule 25 mg (25 mg Oral Given 09/18/13 2023)  oxyCODONE (Oxy IR/ROXICODONE) immediate release tablet 5 mg (5 mg Oral Given 09/18/13 2039)    Discharge Medication List as of 09/18/2013  8:52 PM             Leotis Shames Doretha Imus, PA-C 09/21/13 0865

## 2013-09-18 NOTE — ED Notes (Signed)
Lauren, PA-C is aware of patient's blood pressure, 96/54.

## 2013-09-18 NOTE — Discharge Instructions (Signed)
Call for a follow up appointment with a Family or Primary Care Provider.  Return if Symptoms worsen.   Take medication as prescribed, take your norco as needed for pain. Ice the area 3-4 times a day.

## 2013-09-29 NOTE — ED Provider Notes (Signed)
Medical screening examination/treatment/procedure(s) were performed by non-physician practitioner and as supervising physician I was immediately available for consultation/collaboration.   EKG Interpretation None        Shelda JakesScott W. Keyonta Barradas, MD 09/29/13 [phone]

## 2013-10-01 ENCOUNTER — Other Ambulatory Visit: Payer: Self-pay | Admitting: Orthopaedic Surgery

## 2013-10-01 DIAGNOSIS — M47812 Spondylosis without myelopathy or radiculopathy, cervical region: Secondary | ICD-10-CM

## 2013-10-06 ENCOUNTER — Ambulatory Visit
Admission: RE | Admit: 2013-10-06 | Discharge: 2013-10-06 | Disposition: A | Discharge status: Home / Self Care | Payer: Medicare Other | Source: Ambulatory Visit | Attending: Orthopaedic Surgery | Admitting: Orthopaedic Surgery

## 2013-10-06 VITALS — BP 114/71 | HR 87

## 2013-10-06 DIAGNOSIS — M47812 Spondylosis without myelopathy or radiculopathy, cervical region: Secondary | ICD-10-CM

## 2013-10-06 MED ORDER — IOHEXOL 300 MG/ML  SOLN
10.0000 mL | Freq: Once | INTRAMUSCULAR | Status: AC | PRN
  Administered 2013-10-06: 10 mL via INTRATHECAL

## 2013-10-06 MED ORDER — ONDANSETRON HCL 4 MG/2ML IJ SOLN
4.0000 mg | Freq: Once | INTRAMUSCULAR | Status: AC
  Administered 2013-10-06: 4 mg via INTRAMUSCULAR

## 2013-10-06 MED ORDER — MEPERIDINE HCL 100 MG/ML IJ SOLN
75.0000 mg | Freq: Once | INTRAMUSCULAR | Status: AC
  Administered 2013-10-06: 75 mg via INTRAMUSCULAR

## 2013-10-06 MED ORDER — DIAZEPAM 5 MG PO TABS
10.0000 mg | ORAL_TABLET | Freq: Once | ORAL | Status: AC
  Administered 2013-10-06: 10 mg via ORAL

## 2013-10-06 NOTE — Progress Notes (Signed)
Pt states she has been off zoloft for the past 3 days.  Discharge instructions explained to pt.

## 2013-10-06 NOTE — Progress Notes (Signed)
Reuel BoomDaniel called mom's discharge time.

## 2013-10-06 NOTE — Discharge Instructions (Signed)
Myelogram Discharge Instructions  1. Go home and rest quietly for the next 24 hours.  It is important to lie flat for the next 24 hours.  Get up only to go to the restroom.  You may lie in the bed or on a couch on your back, your stomach, your left side or your right side.  You may have one pillow under your head.  You may have pillows between your knees while you are on your side or under your knees while you are on your back.  2. DO NOT drive today.  Recline the seat as far back as it will go, while still wearing your seat belt, on the way home.  3. You may get up to go to the bathroom as needed.  You may sit up for 10 minutes to eat.  You may resume your normal diet and medications unless otherwise indicated.  Drink lots of extra fluids today and tomorrow.  4. The incidence of headache, nausea, or vomiting is about 5% (one in 20 patients).  If you develop a headache, lie flat and drink plenty of fluids until the headache goes away.  Caffeinated beverages may be helpful.  If you develop severe nausea and vomiting or a headache that does not go away with flat bed rest, call [phone].  5. You may resume normal activities after your 24 hours of bed rest is over; however, do not exert yourself strongly or do any heavy lifting tomorrow. If when you get up you have a headache when standing, go back to bed and force fluids for another 24 hours.  6. Call your physician for a follow-up appointment.  The results of your myelogram will be sent directly to your physician by the following day.  7. If you have any questions or if complications develop after you arrive home, please call [phone].  Discharge instructions have been explained to the patient.  The patient, or the person responsible for the patient, fully understands these instructions.      May resume Zoloft on October 07, 2013, after 9:30 am.

## 2013-11-23 ENCOUNTER — Encounter: Payer: Self-pay | Admitting: Family Medicine

## 2013-11-23 DIAGNOSIS — I152 Hypertension secondary to endocrine disorders: Secondary | ICD-10-CM | POA: Insufficient documentation

## 2013-11-23 DIAGNOSIS — K219 Gastro-esophageal reflux disease without esophagitis: Secondary | ICD-10-CM | POA: Insufficient documentation

## 2013-11-23 DIAGNOSIS — E782 Mixed hyperlipidemia: Secondary | ICD-10-CM | POA: Insufficient documentation

## 2013-11-23 DIAGNOSIS — E039 Hypothyroidism, unspecified: Secondary | ICD-10-CM | POA: Insufficient documentation

## 2013-11-23 DIAGNOSIS — F32A Depression, unspecified: Secondary | ICD-10-CM | POA: Insufficient documentation

## 2013-11-23 DIAGNOSIS — F419 Anxiety disorder, unspecified: Secondary | ICD-10-CM | POA: Insufficient documentation

## 2013-11-23 DIAGNOSIS — T7840XA Allergy, unspecified, initial encounter: Secondary | ICD-10-CM | POA: Insufficient documentation

## 2013-11-23 DIAGNOSIS — F329 Major depressive disorder, single episode, unspecified: Secondary | ICD-10-CM | POA: Insufficient documentation

## 2013-12-10 ENCOUNTER — Ambulatory Visit (INDEPENDENT_AMBULATORY_CARE_PROVIDER_SITE_OTHER): Payer: Medicare Other | Admitting: Physician Assistant

## 2013-12-10 ENCOUNTER — Other Ambulatory Visit: Payer: Self-pay | Admitting: Family Medicine

## 2013-12-10 ENCOUNTER — Encounter: Payer: Self-pay | Admitting: Physician Assistant

## 2013-12-10 VITALS — BP 122/80 | HR 68 | Temp 98.1°F | Resp 18 | Ht <= 58 in | Wt 176.0 lb

## 2013-12-10 DIAGNOSIS — F3289 Other specified depressive episodes: Secondary | ICD-10-CM

## 2013-12-10 DIAGNOSIS — E119 Type 2 diabetes mellitus without complications: Secondary | ICD-10-CM

## 2013-12-10 DIAGNOSIS — E039 Hypothyroidism, unspecified: Secondary | ICD-10-CM

## 2013-12-10 DIAGNOSIS — Z0000 Encounter for general adult medical examination without abnormal findings: Secondary | ICD-10-CM

## 2013-12-10 DIAGNOSIS — E785 Hyperlipidemia, unspecified: Secondary | ICD-10-CM

## 2013-12-10 DIAGNOSIS — F329 Major depressive disorder, single episode, unspecified: Secondary | ICD-10-CM

## 2013-12-10 DIAGNOSIS — K219 Gastro-esophageal reflux disease without esophagitis: Secondary | ICD-10-CM

## 2013-12-10 DIAGNOSIS — Z23 Encounter for immunization: Secondary | ICD-10-CM

## 2013-12-10 DIAGNOSIS — Z1239 Encounter for other screening for malignant neoplasm of breast: Secondary | ICD-10-CM

## 2013-12-10 DIAGNOSIS — F419 Anxiety disorder, unspecified: Secondary | ICD-10-CM

## 2013-12-10 DIAGNOSIS — F32A Depression, unspecified: Secondary | ICD-10-CM

## 2013-12-10 DIAGNOSIS — F411 Generalized anxiety disorder: Secondary | ICD-10-CM

## 2013-12-10 DIAGNOSIS — T7840XA Allergy, unspecified, initial encounter: Secondary | ICD-10-CM

## 2013-12-10 LAB — MICROALBUMIN, URINE: MICROALB UR: 2.24 mg/dL — AB (ref 0.00–1.89)

## 2013-12-10 LAB — COMPLETE METABOLIC PANEL WITH GFR
ALBUMIN: 4 g/dL (ref 3.5–5.2)
ALK PHOS: 117 U/L (ref 39–117)
ALT: 32 U/L (ref 0–35)
AST: 32 U/L (ref 0–37)
BUN: 11 mg/dL (ref 6–23)
CO2: 26 mEq/L (ref 19–32)
CREATININE: 0.65 mg/dL (ref 0.50–1.10)
Calcium: 8.8 mg/dL (ref 8.4–10.5)
Chloride: 102 mEq/L (ref 96–112)
GFR, Est African American: >89 mL/min
GLUCOSE: 172 mg/dL — AB (ref 70–99)
POTASSIUM: 4.3 meq/L (ref 3.5–5.3)
Sodium: 138 mEq/L (ref 135–145)
Total Bilirubin: 0.4 mg/dL (ref 0.2–1.2)
Total Protein: 6.4 g/dL (ref 6.0–8.3)

## 2013-12-10 LAB — CBC WITH DIFFERENTIAL/PLATELET
BASOS ABS: 0 10*3/uL (ref 0.0–0.1)
Basophils Relative: 0 % (ref 0–1)
Eosinophils Absolute: 0.1 10*3/uL (ref 0.0–0.7)
Eosinophils Relative: 2 % (ref 0–5)
HCT: 37.9 % (ref 36.0–46.0)
Hemoglobin: 12.7 g/dL (ref 12.0–15.0)
LYMPHS ABS: 1.6 10*3/uL (ref 0.7–4.0)
LYMPHS PCT: 34 % (ref 12–46)
MCH: 28.4 pg (ref 26.0–34.0)
MCHC: 33.5 g/dL (ref 30.0–36.0)
MCV: 84.8 fL (ref 78.0–100.0)
Monocytes Absolute: 0.3 10*3/uL (ref 0.1–1.0)
Monocytes Relative: 7 % (ref 3–12)
Neutro Abs: 2.7 10*3/uL (ref 1.7–7.7)
Neutrophils Relative %: 57 % (ref 43–77)
PLATELETS: 199 10*3/uL (ref 150–400)
RBC: 4.47 MIL/uL (ref 3.87–5.11)
RDW: 16.4 % — AB (ref 11.5–15.5)
WBC: 4.7 10*3/uL (ref 4.0–10.5)

## 2013-12-10 LAB — HEMOGLOBIN A1C
Hgb A1c MFr Bld: 8 % — ABNORMAL HIGH (ref <5.7)
Mean Plasma Glucose: 183 mg/dL — ABNORMAL HIGH (ref <117)

## 2013-12-10 LAB — LIPID PANEL
Cholesterol: 210 mg/dL — ABNORMAL HIGH (ref 0–200)
HDL: 96 mg/dL (ref >39)
LDL Cholesterol: 84 mg/dL (ref 0–99)
Total CHOL/HDL Ratio: 2.2 Ratio
Triglycerides: 149 mg/dL (ref <150)
VLDL: 30 mg/dL (ref 0–40)

## 2013-12-10 LAB — TSH: TSH: 20.33 u[IU]/mL — ABNORMAL HIGH (ref 0.350–4.500)

## 2013-12-10 MED ORDER — PRAVASTATIN SODIUM 40 MG PO TABS: 40.0000 mg | ORAL_TABLET | Freq: Every day | ORAL | Status: DC

## 2013-12-10 MED ORDER — SERTRALINE HCL 100 MG PO TABS: 100.0000 mg | ORAL_TABLET | Freq: Two times a day (BID) | ORAL | Status: DC

## 2013-12-10 MED ORDER — LEVOTHYROXINE SODIUM 75 MCG PO TABS: 75.0000 ug | ORAL_TABLET | Freq: Every day | ORAL | Status: DC

## 2013-12-10 MED ORDER — METFORMIN HCL 1000 MG PO TABS: 1000.0000 mg | ORAL_TABLET | Freq: Two times a day (BID) | ORAL | Status: DC

## 2013-12-10 MED ORDER — GLIMEPIRIDE 2 MG PO TABS: 1.0000 mg | ORAL_TABLET | Freq: Every day | ORAL | Status: DC

## 2013-12-10 MED ORDER — FAMOTIDINE 20 MG PO TABS: 20.0000 mg | ORAL_TABLET | Freq: Every day | ORAL | Status: DC

## 2013-12-11 ENCOUNTER — Telehealth: Payer: Self-pay | Admitting: Family Medicine

## 2013-12-11 DIAGNOSIS — Z79899 Other long term (current) drug therapy: Secondary | ICD-10-CM

## 2013-12-11 DIAGNOSIS — E039 Hypothyroidism, unspecified: Secondary | ICD-10-CM

## 2013-12-11 LAB — VITAMIN D 25 HYDROXY (VIT D DEFICIENCY, FRACTURES): VIT D 25 HYDROXY: 13 ng/mL — AB (ref 30–89)

## 2013-12-11 MED ORDER — PIOGLITAZONE HCL 45 MG PO TABS: 45.0000 mg | ORAL_TABLET | Freq: Every day | ORAL | Status: DC

## 2013-12-11 MED ORDER — LEVOTHYROXINE SODIUM 200 MCG PO TABS: 200.0000 ug | ORAL_TABLET | Freq: Every day | ORAL | Status: DC

## 2013-12-11 NOTE — Telephone Encounter (Signed)
Pt aware of lab results and provider recommendations   Med list updated and rx's to pharmacy.  Pt aware of 6 week TSH recheck and has 3 mth appt

## 2013-12-11 NOTE — Telephone Encounter (Signed)
Message copied by Donne Anon on Fri Dec 11, 2013  1:57 PM ------      Message from: Allayne Butcher      Created: Fri Dec 11, 2013  8:37 AM       This was our 8:00 patient yesterday who was a new pt to [place]      Tells her to:      Add Actos 45 mg one by mouth daily --Send a prescription for #30+2 refills      Tell her to increase her thyroid dose. Stop her current thyroid medication and start new prescription of levothyroxine 200 mcg 1 by mouth daily  #30+1 refill.      Tell her to start taking vitamin D 4000 units daily over-the-counter      Tell her to come in for a TSH in 6 Weeks Please place future order for TSH      Tell her to make sure she comes in for her followup appointment in 3 months which she has already scheduled for 03/11/14. ------

## 2013-12-11 NOTE — Progress Notes (Signed)
Patient ID: Marissa Thompson MRN: [id number], DOB: 1950/10/24, 63 y.o. Date of Encounter: 12/11/2013,   Chief Complaint: Physical (CPE)  HPI: 63 y.o. y/o white female here as a new patient to establish care at our practice and also for CPE.   She says that for her PCP she had been seeing Dr. Laurann Montana with [facility] and had been seeing her for about 6 or 7 years. Say her last visit there was about 1 year ago. Last labs there were also probably about one year ago. She is fasting today and wouldl like to do labs while she is here for this visit.  She does not want to do a pelvic exam today. Says that her last Pap smear was about a year ago and was normal. Also she says that she had other pelvic evaluation this past year. Reports she had an MRI that revealed uterine polyps. Says that these were benign but she underwent a D&C. Says that all of this was done with gynecology in February 2015. Therefore does not feel that she needs any pelvic exam or Pap smear today.  She states that the only other doctor/medical provider  she sees is Dr. [facility]yn [facility]. She says that she has had 3 back surgeries. She takes the gabapentin/Neurontin for this. (Not for diabetic neuropathy).  She says that Dr. Noel Gerold prescribes her Neurontin as well as Flexeril.  PCP prescribes all other medicines.  She has no active complaints today.   Review of Systems: Consitutional: No fever, chills, fatigue, night sweats, lymphadenopathy. No significant/unexplained weight changes. Eyes: No visual changes, eye redness, or discharge. ENT/Mouth: No ear pain, sore throat, nasal drainage, or sinus pain. Cardiovascular: No chest pressure,heaviness, tightness or squeezing, even with exertion. No increased shortness of breath or dyspnea on exertion.No palpitations, edema, orthopnea, PND. Respiratory: No cough, hemoptysis, SOB, or wheezing. Gastrointestinal: No anorexia, dysphagia, reflux, pain, nausea,  vomiting, hematemesis, diarrhea, constipation, BRBPR, or melena. Breast: No mass, nodules, bulging, or retraction. No skin changes or inflammation. No nipple discharge. No lymphadenopathy. Genitourinary: No dysuria, hematuria, incontinence, vaginal discharge, pruritis, burning, abnormal bleeding, or pain. Musculoskeletal: No decreased ROM, No joint pain or swelling. No significant pain in neck, back, or extremities. Skin: No rash, pruritis, or concerning lesions. Neurological: No headache, dizziness, syncope, seizures, tremors, memory loss, coordination problems, or paresthesias. Psychological: No anxiety, depression, hallucinations, SI/HI. Endocrine: No polydipsia, polyphagia, polyuria, or known diabetes.No increased fatigue. No palpitations/rapid heart rate. No significant/unexplained weight change. All other systems were reviewed and are otherwise negative.  Past Medical History  Diagnosis Date  . PONV (postoperative nausea and vomiting)   . Allergy   . Anxiety 2003  . Depression 1998  . Diabetes mellitus without complication 1998  . GERD (gastroesophageal reflux disease) 1980  . Hyperlipidemia 1990  . Hypothyroidism 1998     Past Surgical History  Procedure Laterality Date  . Tonsillectomy    . Ectopic pregnancy surgery    . Back surgery      x3  . Esophageal dilation      x3  . Hysteroscopy w/d&c N/A 08/17/2013    Procedure: DILATATION AND CURETTAGE /HYSTEROSCOPY;  Surgeon: Mickel Baas, MD;  Location: [facility];  Service: Gynecology;  Laterality: N/A;  YAG LASER  . Cervical polypectomy N/A 08/17/2013    Procedure: CERVICAL POLYPECTOMY;  Surgeon: Mickel Baas, MD;  Location: [facility];  Service: Gynecology;  Laterality: N/A;  . Neck surgery    .  Colon surgery  2007    benign mass  . Spine surgery      Home Meds:  Outpatient Prescriptions Prior to Visit  Medication Sig Dispense Refill  . aspirin EC 81 MG tablet Take 81 mg by mouth daily.      . cyclobenzaprine (FLEXERIL)  10 MG tablet Take 10 mg by mouth 3 (three) times daily.      Marland Kitchen gabapentin (NEURONTIN) 300 MG capsule Take 300 mg by mouth 3 (three) times daily.      Marland Kitchen ibuprofen (ADVIL,MOTRIN) 800 MG tablet Take 800 mg by mouth 3 (three) times daily.      . famotidine (PEPCID) 20 MG tablet Take 20 mg by mouth daily.      Marland Kitchen glimepiride (AMARYL) 2 MG tablet Take 1 mg by mouth daily with breakfast.      . levothyroxine (SYNTHROID, LEVOTHROID) 75 MCG tablet Take 75 mcg by mouth daily before breakfast.      . metFORMIN (GLUCOPHAGE) 1000 MG tablet Take 1,000 mg by mouth 2 (two) times daily with a meal.      . pravastatin (PRAVACHOL) 40 MG tablet Take 40 mg by mouth daily.      . sertraline (ZOLOFT) 100 MG tablet Take 100 mg by mouth 2 (two) times daily.      Marland Kitchen HYDROcodone-acetaminophen (NORCO) 10-325 MG per tablet Take 1 tablet by mouth every 6 (six) hours as needed.       No facility-administered medications prior to visit.    Allergies:  Allergies  Allergen Reactions  . Morphine And Related Itching  . Talwin [Pentazocine] Itching  . Toradol [Ketorolac Tromethamine] Other (See Comments)    Slurred speech, confusion  . Penicillins Rash    History   Social History  . Marital Status: Legally Separated    Spouse Name: N/A    Number of Children: N/A  . Years of Education: N/A   Occupational History  . Not on file.   Social History Main Topics  . Smoking status: Never Smoker   . Smokeless tobacco: Never Used  . Alcohol Use: No  . Drug Use: No  . Sexual Activity: Not Currently   Other Topics Concern  . Not on file   Social History Narrative   Separated. Son lives with her.    On Disability--secondary to back. On disability since 2012.   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    Never smoked.     Family History  Problem Relation Age of Onset  . Depression Mother   . Diabetes Mother   .  Hypertension Mother   . Alcohol abuse Father   . Arthritis Father   . Cancer Father 52    Oral Cancer- had tongue, jaw resection--smoker and alcohol  . Alcohol abuse Brother     Physical Exam: Blood pressure 122/80, pulse 68, temperature 98.1 F (36.7 C), temperature source Oral, resp. rate 18, height 4' 9.75" (1.467 m), weight 176 lb (79.833 kg)., Body mass index is 37.1 kg/(m^2). General: Well developed, well nourished,WF. Appears in no acute distress. HEENT: Normocephalic, atraumatic. Conjunctiva pink, sclera non-icteric. Pupils 2 mm constricting to 1 mm, round, regular, and equally reactive to light and accomodation. EOMI. Internal auditory canal clear. TMs with good cone of light and without pathology. Nasal mucosa pink. Nares are without discharge. No sinus tenderness. Oral mucosa pink.  Pharynx without exudate.   Neck:  Supple. Trachea midline. No thyromegaly. Full ROM. No lymphadenopathy.No Carotid Bruits. Lungs: Clear to auscultation bilaterally without wheezes, rales, or rhonchi. Breathing is of normal effort and unlabored. Cardiovascular: RRR with S1 S2. No murmurs, rubs, or gallops. Distal pulses 2+ symmetrically. No carotid or abdominal bruits. Breast: Symmetrical. No masses. Nipples without discharge. Abdomen: Soft, non-tender, non-distended with normoactive bowel sounds. No hepatosplenomegaly or masses. No rebound/guarding. No CVA tenderness. No hernias.  Genitourinary: deferred. See HPI. Musculoskeletal: Full range of motion and 5/5 strength throughout. Without swelling, atrophy, tenderness, crepitus, or warmth. Extremities without clubbing, cyanosis, or edema. Calves supple. Skin: Warm and moist without erythema, ecchymosis, wounds, or rash. Neuro: A+Ox3. CN II-XII grossly intact. Moves all extremities spontaneously. Full sensation throughout. Normal gait. DTR 2+ throughout upper and lower extremities. Finger to nose intact. Psych:  Responds to questions appropriately with a  normal affect. Exam: Left posterior tibial pulses 1+ to 2+. Left dorsalis pedis pulses not palpable.  Right posterior tibial pulse is not palpable. Right dorsalis pedis pulses trace.    Assessment/Plan:  63 y.o. y/o female here for CPE  1. Visit for preventive health examination  A. Screening Labs: - COMPLETE METABOLIC PANEL WITH GFR - CBC with Differential - Lipid panel - TSH - Vit D  25 hydroxy (rtn osteoporosis monitoring)  B. Pap:See HPI. Reports had Pap one year ago-normal.  C. Screening Mammogram: She reports her last mammogram was about 2 years ago. Does not recall exact facility. I will go ahead and do a referral with breast Center. - MM Digital Screening; Future  D. DEXA/BMD:  Wait until age 9 to discuss  E. Colorectal Cancer Screening: She reports that she had a colonoscopy with [facility] about one year ago. Reports that there were no polyps and that this was normal.  Reports that in 2007 she was found to have a "mass in her colon and had  1/3  of her colon removed "  --I am not certain the date that she needs to have a repeat colonoscopy. We will need to follow up getting records from [facility].  F. Immunizations:  Influenza:  Had 03/16/2013 Tetanus: Last with 07/17/2003. She is agreeable to update this today. Pneumococcal: She received the Pneumovax 23 on 07/17/2011. Discussed that she will need Prevnar 13. We will await to further discuss this at next visit. I need to check the guidelines to see if we wait to do this at age 31. As well she did not want to receive more than one vaccine at the time today. Zostavax: I did not discuss Zostavax with her today. I will follow up this discussion that followup visit.   2. Breast cancer screening - MM Digital Screening; Future  3. Diabetes mellitus without complication  I will enter diabetic foot exam into quality metrics today ( 12/10/13)  She is on aspirin 81 mg. She is on statin-- pravastatin 40 She is on no ACE inhibitor  and is on no ARB. However she does not have high blood pressure. Today's blood pressure is 122/80. Will wait and check another blood pressure at next visit. Will consider adding a low-dose ACE inhibitor for renal protection. - COMPLETE METABOLIC PANEL WITH GFR - Hemoglobin A1c - Microalbumin, urine  4. Hyperlipidemia On pravastatin 40 mg - COMPLETE METABOLIC PANEL WITH GFR - Lipid panel  5. Hypothyroidism - TSH  6. Anxiety Controlled/stable  7. Depression Controlled/stable  8. Allergy Controlled/stable  9. GERD (gastroesophageal reflux disease) Controlled/stable  10. Need for prophylactic vaccination  with combined diphtheria-tetanus-pertussis (DTP) vaccine - Tdap vaccine greater than or equal to 7yo IM  F/U Office visit 3 months or sooner if needed.   Signed, [address]Chanette Demo Beth HinckleyDixon, [place], BSFM 12/11/2013 10:00 AM

## 2014-02-08 ENCOUNTER — Ambulatory Visit (INDEPENDENT_AMBULATORY_CARE_PROVIDER_SITE_OTHER): Payer: Medicare Other | Admitting: *Deleted

## 2014-02-08 DIAGNOSIS — Z111 Encounter for screening for respiratory tuberculosis: Secondary | ICD-10-CM

## 2014-02-10 ENCOUNTER — Ambulatory Visit: Payer: Medicare Other | Admitting: *Deleted

## 2014-02-10 DIAGNOSIS — Z111 Encounter for screening for respiratory tuberculosis: Secondary | ICD-10-CM

## 2014-02-10 LAB — TB SKIN TEST
Induration: 0 mm
TB Skin Test: NEGATIVE

## 2014-03-03 ENCOUNTER — Ambulatory Visit (INDEPENDENT_AMBULATORY_CARE_PROVIDER_SITE_OTHER): Payer: Medicare Other | Admitting: Physician Assistant

## 2014-03-03 ENCOUNTER — Encounter: Payer: Self-pay | Admitting: Physician Assistant

## 2014-03-03 VITALS — BP 110/60 | HR 94 | Temp 97.8°F | Resp 20 | Ht <= 58 in | Wt 182.0 lb

## 2014-03-03 DIAGNOSIS — F329 Major depressive disorder, single episode, unspecified: Secondary | ICD-10-CM

## 2014-03-03 DIAGNOSIS — E039 Hypothyroidism, unspecified: Secondary | ICD-10-CM

## 2014-03-03 DIAGNOSIS — F32A Depression, unspecified: Secondary | ICD-10-CM

## 2014-03-03 DIAGNOSIS — F3289 Other specified depressive episodes: Secondary | ICD-10-CM

## 2014-03-03 DIAGNOSIS — K219 Gastro-esophageal reflux disease without esophagitis: Secondary | ICD-10-CM

## 2014-03-03 DIAGNOSIS — F419 Anxiety disorder, unspecified: Secondary | ICD-10-CM

## 2014-03-03 DIAGNOSIS — E119 Type 2 diabetes mellitus without complications: Secondary | ICD-10-CM

## 2014-03-03 DIAGNOSIS — F411 Generalized anxiety disorder: Secondary | ICD-10-CM

## 2014-03-03 DIAGNOSIS — Z1239 Encounter for other screening for malignant neoplasm of breast: Secondary | ICD-10-CM

## 2014-03-03 DIAGNOSIS — E785 Hyperlipidemia, unspecified: Secondary | ICD-10-CM

## 2014-03-03 DIAGNOSIS — E559 Vitamin D deficiency, unspecified: Secondary | ICD-10-CM | POA: Insufficient documentation

## 2014-03-04 ENCOUNTER — Other Ambulatory Visit: Payer: Self-pay | Admitting: *Deleted

## 2014-03-04 DIAGNOSIS — E039 Hypothyroidism, unspecified: Secondary | ICD-10-CM

## 2014-03-04 LAB — TSH: TSH: 0.065 u[IU]/mL — AB (ref 0.350–4.500)

## 2014-03-04 MED ORDER — LEVOTHYROXINE SODIUM 175 MCG PO TABS: 175.0000 ug | ORAL_TABLET | Freq: Every day | ORAL | Status: DC

## 2014-03-04 NOTE — Progress Notes (Signed)
Patient ID: Marissa Thompson MRN: [id number], DOB: 04-Sep-1950, 63 y.o. Date of Encounter: 03/04/2014,   Chief Complaint: F/U Diabetes and Hypothyroidism  HPI: 63 y.o. y/o white female here for f/u OV.   She saw me 12/11/2013-- as a new patient to establish care at our practice and also for CPE.  THE FOLLOWING IS COPIED FROM THAT OV 12/11/2013:  She says that for her PCP she had been seeing Dr. Laurann Montana with [facility] and had been seeing her for about 6 or 7 years. Say her last visit there was about 1 year ago. Last labs there were also probably about one year ago. She is fasting today and wouldl like to do labs while she is here for this visit.  She does not want to do a pelvic exam today. Says that her last Pap smear was about a year ago and was normal. Also she says that she had other pelvic evaluation this past year. Reports she had an MRI that revealed uterine polyps. Says that these were benign but she underwent a D&C. Says that all of this was done with gynecology in February 2015. Therefore does not feel that she needs any pelvic exam or Pap smear today.  She states that the only other doctor/medical provider  she sees is Dr. Sharolyn Douglas [facility]. She says that she has had 3 back surgeries. She takes the gabapentin/Neurontin for this. (Not for diabetic neuropathy).  She says that Dr. Noel Gerold prescribes her Neurontin as well as Flexeril.  PCP prescribes all other medicines.  She has no active complaints today.  LABS 12/11/2013 SHOWED:  AIC---8.0 TSH---20.330 VIT D---13  My results note for those labs said to: Add Actos 45 mg daily Increase levothyroxine to 200 mcg daily Start over-the-counter vitamin D 4000 units daily  Today I discussed this with her. She says that she did not add the Actos because she "had heard bad things about that medicine" Says that she had did increase the levothyroxine to 200 mcg She did not start the over-the-counter vitamin  D she forgot all about it  She says that she does check her blood sugars but does not have been documented on the blood sugar log. Says that fasting morning readings had been running in the 150s but more recently have been in the 180s.  Also followed up what we had done for preventative care. Asked her about her mammogram she says that she did not get this done-- says that the place where we sent her was not the place she gone in the past and they said that she needed to followup with place where she had her prior mammograms. She says in the past she had gone to the building that was on [address]. I think this was [facility] imaging.   Review of Systems: Consitutional: No fever, chills, fatigue, night sweats, lymphadenopathy. No significant/unexplained weight changes. Eyes: No visual changes, eye redness, or discharge. ENT/Mouth: No ear pain, sore throat, nasal drainage, or sinus pain. Cardiovascular: No chest pressure,heaviness, tightness or squeezing, even with exertion. No increased shortness of breath or dyspnea on exertion.No palpitations, edema, orthopnea, PND. Respiratory: No cough, hemoptysis, SOB, or wheezing. Gastrointestinal: No anorexia, dysphagia, reflux, pain, nausea, vomiting, hematemesis, diarrhea, constipation, BRBPR, or melena. Breast: No mass, nodules, bulging, or retraction. No skin changes or inflammation. No nipple discharge. No lymphadenopathy. Genitourinary: No dysuria, hematuria, incontinence, vaginal discharge, pruritis, burning, abnormal bleeding, or pain. Musculoskeletal: No  decreased ROM, No joint pain or swelling. No significant pain in neck, back, or extremities. Skin: No rash, pruritis, or concerning lesions. Neurological: No headache, dizziness, syncope, seizures, tremors, memory loss, coordination problems, or paresthesias. Psychological: No anxiety, depression, hallucinations, SI/HI. Endocrine: .No increased fatigue. No palpitations/rapid  heart rate. No significant/unexplained weight change. All other systems were reviewed and are otherwise negative.  Past Medical History  Diagnosis Date  . PONV (postoperative nausea and vomiting)   . Allergy   . Anxiety 2003  . Depression 1998  . Diabetes mellitus without complication 1998  . GERD (gastroesophageal reflux disease) 1980  . Hyperlipidemia 1990  . Hypothyroidism 1998     Past Surgical History  Procedure Laterality Date  . Tonsillectomy    . Ectopic pregnancy surgery    . Back surgery      x3  . Esophageal dilation      x3  . Hysteroscopy w/d&c N/A 08/17/2013    Procedure: DILATATION AND CURETTAGE /HYSTEROSCOPY;  Surgeon: Mickel Baas, MD;  Location: [facility];  Service: Gynecology;  Laterality: N/A;  YAG LASER  . Cervical polypectomy N/A 08/17/2013    Procedure: CERVICAL POLYPECTOMY;  Surgeon: Mickel Baas, MD;  Location: [facility];  Service: Gynecology;  Laterality: N/A;  . Neck surgery    . Colon surgery  2007    benign mass  . Spine surgery      Home Meds:  Outpatient Prescriptions Prior to Visit  Medication Sig Dispense Refill  . aspirin EC 81 MG tablet Take 81 mg by mouth daily.      . Cholecalciferol (VITAMIN D PO) Take 4,000 Int'l Units by mouth daily.      . cyclobenzaprine (FLEXERIL) 10 MG tablet Take 10 mg by mouth 3 (three) times daily.      . famotidine (PEPCID) 20 MG tablet Take 1 tablet (20 mg total) by mouth daily.  30 tablet  5  . gabapentin (NEURONTIN) 300 MG capsule Take 300 mg by mouth 3 (three) times daily.      Marland Kitchen glimepiride (AMARYL) 2 MG tablet Take 0.5 tablets (1 mg total) by mouth daily with breakfast.  15 tablet  5  . ibuprofen (ADVIL,MOTRIN) 800 MG tablet Take 800 mg by mouth 3 (three) times daily.      Marland Kitchen levothyroxine (SYNTHROID, LEVOTHROID) 200 MCG tablet Take 1 tablet (200 mcg total) by mouth daily before breakfast.  30 tablet  1  . metFORMIN (GLUCOPHAGE) 1000 MG tablet Take 1 tablet (1,000 mg total) by mouth 2 (two) times daily  with a meal.  60 tablet  5  . pioglitazone (ACTOS) 45 MG tablet Take 1 tablet (45 mg total) by mouth daily.  30 tablet  2  . pravastatin (PRAVACHOL) 40 MG tablet Take 1 tablet (40 mg total) by mouth daily with supper.  30 tablet  5  . sertraline (ZOLOFT) 100 MG tablet Take 1 tablet (100 mg total) by mouth 2 (two) times daily.  60 tablet  5   No facility-administered medications prior to visit.    Allergies:  Allergies  Allergen Reactions  . Morphine And Related Itching  . Talwin [Pentazocine] Itching  . Toradol [Ketorolac Tromethamine] Other (See Comments)    Slurred speech, confusion  . Penicillins Rash    History   Social History  . Marital Status: Legally Separated    Spouse Name: N/A    Number of Children: N/A  . Years of Education: N/A   Occupational History  . Not on  file.   Social History Main Topics  . Smoking status: Never Smoker   . Smokeless tobacco: Never Used  . Alcohol Use: No  . Drug Use: No  . Sexual Activity: Not Currently   Other Topics Concern  . Not on file   Social History Narrative   Separated. Son lives with her.    On Disability--secondary to back. On disability since 2012.   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    Never smoked.     Family History  Problem Relation Age of Onset  . Depression Mother   . Diabetes Mother   . Hypertension Mother   . Alcohol abuse Father   . Arthritis Father   . Cancer Father [phone]    Oral Cancer- had tongue, jaw resection--smoker and alcohol  . Alcohol abuse Brother     Physical Exam: Blood pressure 110/60, pulse 94, temperature 97.8 F (36.6 C), temperature source Oral, resp. rate 20, height 4' 9.75" (1.467 m), weight 182 lb (82.555 kg)., Body mass index is 38.36 kg/(m^2). General: Well developed, well nourished,WF. Appears in no acute distress. Neck: Supple. Trachea midline. No thyromegaly. Full ROM. No  lymphadenopathy.No Carotid Bruits. Lungs: Clear to auscultation bilaterally without wheezes, rales, or rhonchi. Breathing is of normal effort and unlabored. Cardiovascular: RRR with S1 S2. No murmurs, rubs, or gallops. Distal pulses 2+ symmetrically. No carotid or abdominal bruits. Abdomen: Soft, non-tender, non-distended with normoactive bowel sounds. No hepatosplenomegaly or masses. No rebound/guarding. No CVA tenderness. No hernias.  Musculoskeletal: Full range of motion and 5/5 strength throughout. Without swelling. Skin: Warm and moist without erythema, ecchymosis, wounds, or rash. Neuro: A+Ox3. CN II-XII grossly intact. Moves all extremities spontaneously. Full sensation throughout. Normal gait. Psych:  Responds to questions appropriately with a normal affect. Exam: Left posterior tibial pulses 1+ to 2+. Left dorsalis pedis pulses not palpable.  Right posterior tibial pulse is not palpable. Right dorsalis pedis pulses trace.    Assessment/Plan:  63 y.o. y/o female here for  1. Diabetes mellitus without complication - Hemoglobin A1c--lab velocities not then a full 3 months since last A1c. Therefore this was canceled. We'll wait to repeat this.  TODAY SHE IS AGREEABLE TO START THE ACTOS.  ADD ACTOS 45MG  QD.  Diabetic Foot Exam --Entered into quality metrics  12/10/2013  She is on aspirin 81 mg. She is on statin-- pravastatin 40 She is on no ACE inhibitor and is on no ARB. However she does not have high blood pressure. 11/2013 blood pressure was 122/80. 02/2014 BP still low at 110/60. Will wait and check another blood pressure at next visit. Will consider adding a low-dose ACE inhibitor for renal protection. - COMPLETE METABOLIC PANEL WITH GFR--done 1/61095/2015 - Hemoglobin A1c--done 11/2013 - Microalbumin, urine---done 11/2013     2. Hypothyroidism, unspecified hypothyroidism type - TSH  3. Hyperlipidemia On pravastatin 40 mg FLP at goal 11/2013. LFTs normal 11/2013  4.  Depression Controlled on current medication 5. Anxiety Controlled on current medication 6. Gastroesophageal reflux disease, esophagitis presence not specified Controlled on current medication 7. Vitamin D deficiency I discussed the lab results again today and told her to start thousand units over-the-counter daily. 8. Breast cancer screening [facility] - MM Digital Screening; Future     Visit for preventive health examination---THIS WAS DONE 11/2013. THE FOLLOWING IS COPIED FROM THAT OV NOTE:  A. Screening Labs: -  COMPLETE METABOLIC PANEL WITH GFR - CBC with Differential - Lipid panel - TSH - Vit D  25 hydroxy (rtn osteoporosis monitoring)  B. Pap:See HPI. Reports had Pap one year ago-normal.  C. Screening Mammogram: She reports her last mammogram was about 2 years ago. Does not recall exact facility. I will go ahead and do a referral with breast Center. - MM Digital Screening; Future  D. DEXA/BMD:  Wait until age 76 to discuss  E. Colorectal Cancer Screening: She reports that she had a colonoscopy with [facility] about one year ago. Reports that there were no polyps and that this was normal.  Reports that in 2007 she was found to have a "mass in her colon and had  1/3  of her colon removed "  --I am not certain the date that she needs to have a repeat colonoscopy. We will need to follow up getting records from [facility].  F. Immunizations:  Influenza:  Had 03/16/2013 Tetanus: Last with 07/17/2003. She is agreeable to update this today.--gIVEN HERE 11/2013. Pneumococcal: She received the Pneumovax 23 on 07/17/2011. Discussed that she will need Prevnar 13. We will await to further discuss this at next visit. I need to check the guidelines to see if we wait to do this at age 70. As well she did not want to receive more than one vaccine at the time today. Zostavax: I did not discuss Zostavax with her today. I will follow up this discussion that followup  visit.    F/U Office visit 3 months or sooner if needed.   Murray Hodgkins Ethel, [place], [facility] 03/04/2014 7:54 AM

## 2014-03-11 ENCOUNTER — Ambulatory Visit: Payer: Medicare Other | Admitting: Physician Assistant

## 2014-03-25 ENCOUNTER — Ambulatory Visit: Payer: Medicare Other

## 2014-06-03 ENCOUNTER — Ambulatory Visit: Payer: Medicare Other | Admitting: Physician Assistant

## 2014-06-07 ENCOUNTER — Encounter: Payer: Self-pay | Admitting: Family Medicine

## 2014-06-07 ENCOUNTER — Ambulatory Visit (INDEPENDENT_AMBULATORY_CARE_PROVIDER_SITE_OTHER): Payer: Medicare Other | Admitting: Family Medicine

## 2014-06-07 VITALS — BP 104/72 | HR 80 | Temp 98.2°F | Resp 14 | Ht 59.0 in | Wt 184.0 lb

## 2014-06-07 DIAGNOSIS — R8290 Unspecified abnormal findings in urine: Secondary | ICD-10-CM

## 2014-06-07 DIAGNOSIS — E1165 Type 2 diabetes mellitus with hyperglycemia: Secondary | ICD-10-CM

## 2014-06-07 DIAGNOSIS — E038 Other specified hypothyroidism: Secondary | ICD-10-CM

## 2014-06-07 DIAGNOSIS — IMO0002 Reserved for concepts with insufficient information to code with codable children: Secondary | ICD-10-CM

## 2014-06-07 LAB — URINALYSIS, ROUTINE W REFLEX MICROSCOPIC
BILIRUBIN URINE: NEGATIVE
Glucose, UA: 500 mg/dL — AB
Hgb urine dipstick: NEGATIVE
Ketones, ur: NEGATIVE mg/dL
NITRITE: POSITIVE — AB
Protein, ur: NEGATIVE mg/dL
SPECIFIC GRAVITY, URINE: 1.01 (ref 1.005–1.030)
Urobilinogen, UA: 0.2 mg/dL (ref 0.0–1.0)
pH: 6 (ref 5.0–8.0)

## 2014-06-07 LAB — URINALYSIS, MICROSCOPIC ONLY
CASTS: NONE SEEN
CRYSTALS: NONE SEEN
RBC / HPF: NONE SEEN RBC/hpf (ref <3)

## 2014-06-07 MED ORDER — LEVOTHYROXINE SODIUM 175 MCG PO TABS: 175.0000 ug | ORAL_TABLET | Freq: Every day | ORAL | Status: DC

## 2014-06-07 MED ORDER — CIPROFLOXACIN HCL 500 MG PO TABS: 500.0000 mg | ORAL_TABLET | Freq: Two times a day (BID) | ORAL | Status: DC

## 2014-06-07 NOTE — Progress Notes (Signed)
Subjective:    Patient ID: Marissa CornerJudith M Thompson, female    DOB: 08/26/1950, 63 y.o.   MRN: [id number]  HPI Patient was last seen in May. At that time her hemoglobin A1c was 8.0 on Glucophage. She was started on Actos but the patient quickly stop Actos after she experienced weight gain. At the present time, the patient is only taking Glucophage. She reports fasting blood sugars between 202 150. She also reports polyuria. Her O2 has a very foul-smelling as well as a very strong smell. Urinalysis today is significant for nitrites as well as leukocyte esterase she denies any hematuria, dysuria, urgency, or frequency. Patient recently resumed levothyroxine 175 g by mouth daily. She's only been taking that dose for 1 week. Prior to that she was on no medication. Therefore a TSH rechecked today will be inaccurate. Her blood pressure is excellent 104/72. Past Medical History  Diagnosis Date  . PONV (postoperative nausea and vomiting)   . Allergy   . Anxiety 2003  . Depression 1998  . Diabetes mellitus without complication 1998  . GERD (gastroesophageal reflux disease) 1980  . Hyperlipidemia 1990  . Hypothyroidism 1998   Past Surgical History  Procedure Laterality Date  . Tonsillectomy    . Ectopic pregnancy surgery    . Back surgery      x3  . Esophageal dilation      x3  . Hysteroscopy w/d&c N/A 08/17/2013    Procedure: DILATATION AND CURETTAGE /HYSTEROSCOPY;  Surgeon: Mickel Baasichard D Kaplan, MD;  Location: [facility];  Service: Gynecology;  Laterality: N/A;  YAG LASER  . Cervical polypectomy N/A 08/17/2013    Procedure: CERVICAL POLYPECTOMY;  Surgeon: Mickel Baasichard D Kaplan, MD;  Location: [facility];  Service: Gynecology;  Laterality: N/A;  . Neck surgery    . Colon surgery  2007    benign mass  . Spine surgery     Current Outpatient Prescriptions on File Prior to Visit  Medication Sig Dispense Refill  . aspirin EC 81 MG tablet Take 81 mg by mouth daily.    . Cholecalciferol (VITAMIN D PO) Take 4,000 Int'l  Units by mouth daily.    . cyclobenzaprine (FLEXERIL) 10 MG tablet Take 10 mg by mouth 3 (three) times daily.    . famotidine (PEPCID) 20 MG tablet Take 1 tablet (20 mg total) by mouth daily. 30 tablet 5  . gabapentin (NEURONTIN) 300 MG capsule Take 300 mg by mouth 3 (three) times daily.    Marland Kitchen. ibuprofen (ADVIL,MOTRIN) 800 MG tablet Take 800 mg by mouth 3 (three) times daily.    . metFORMIN (GLUCOPHAGE) 1000 MG tablet Take 1 tablet (1,000 mg total) by mouth 2 (two) times daily with a meal. 60 tablet 5  . pravastatin (PRAVACHOL) 40 MG tablet Take 1 tablet (40 mg total) by mouth daily with supper. 30 tablet 5  . sertraline (ZOLOFT) 100 MG tablet Take 1 tablet (100 mg total) by mouth 2 (two) times daily. 60 tablet 5   No current facility-administered medications on file prior to visit.   Allergies  Allergen Reactions  . Morphine And Related Itching  . Talwin [Pentazocine] Itching  . Toradol [Ketorolac Tromethamine] Other (See Comments)    Slurred speech, confusion  . Penicillins Rash   History   Social History  . Marital Status: Legally Separated    Spouse Name: N/A    Number of Children: N/A  . Years of Education: N/A   Occupational History  . Not on file.   Social  History Main Topics  . Smoking status: Never Smoker   . Smokeless tobacco: Never Used  . Alcohol Use: No  . Drug Use: No  . Sexual Activity: Not Currently   Other Topics Concern  . Not on file   Social History Narrative   Separated. Son lives with her.    On Disability--secondary to back. On disability since 2012.   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    Never smoked.       Review of Systems  All other systems reviewed and are negative.      Objective:   Physical Exam  Neck: No JVD present. No thyromegaly present.  Cardiovascular: Normal rate, regular rhythm and normal heart sounds.   Pulmonary/Chest:  Effort normal and breath sounds normal. No respiratory distress. She has no wheezes. She has no rales. She exhibits no tenderness.  Abdominal: Soft. Bowel sounds are normal. She exhibits no distension. There is no tenderness. There is no rebound and no guarding.  Musculoskeletal: She exhibits no edema.  Lymphadenopathy:    She has no cervical adenopathy.  Vitals reviewed.         Assessment & Plan:  Bad odor of urine - Plan: Urinalysis, Routine w reflex microscopic  Diabetes mellitus type II, uncontrolled - Plan: CBC with Differential, COMPLETE METABOLIC PANEL WITH GFR, Lipid panel, Hemoglobin A1c, Microalbumin, urine  Other specified hypothyroidism  I believe the patient's malodorous urine is due to dehydration and not a urinary tract infection. I did give the patient a prescription for Cipro 500 mg by mouth twice a day for 3 days but I asked her not to fill it unless she develops symptoms of bladder infection. Instead I recommended that she increase her fluid intake. If the smell does not improve after she rehydrates herself, then I would like her to take Cipro. I have asked the patient to return fasting for a CMP, fasting lipid panel, hemoglobin A1c, and urine microalbumin. If her hemoglobin A1c is still well above 6.5 I would recommend starting the patient on jardiance 25 mg poqday to help lower her blood sugar, and help achieve weight loss without causing hypoglycemia that she experienced on glimepiride.  I will also check a urine microalbumin, if elevated, I would recommend starting the patient on a low-dose ARB.  I have asked the patient to take levothyroxine 175 g by mouth daily for at least 6 weeks prior to checking a TSH.  Therefore, we can accurately determine the dose of thyroid medication that she needs.

## 2014-06-20 ENCOUNTER — Other Ambulatory Visit: Payer: Self-pay | Admitting: Physician Assistant

## 2014-06-21 NOTE — Telephone Encounter (Signed)
Was noted patient here last month and was to return for repeat TSH and fasting labs.  I called patient and she says she will come tomorrow.  One refill of med sent.

## 2014-06-22 ENCOUNTER — Other Ambulatory Visit: Payer: Self-pay

## 2014-06-24 ENCOUNTER — Other Ambulatory Visit: Payer: Medicare Other

## 2014-06-24 LAB — CBC WITH DIFFERENTIAL/PLATELET
BASOS ABS: 0 10*3/uL (ref 0.0–0.1)
Basophils Relative: 0 % (ref 0–1)
Eosinophils Absolute: 0 10*3/uL (ref 0.0–0.7)
Eosinophils Relative: 1 % (ref 0–5)
HCT: 37.6 % (ref 36.0–46.0)
HEMOGLOBIN: 12.7 g/dL (ref 12.0–15.0)
LYMPHS PCT: 31 % (ref 12–46)
Lymphs Abs: 1.5 10*3/uL (ref 0.7–4.0)
MCH: 27.3 pg (ref 26.0–34.0)
MCHC: 33.8 g/dL (ref 30.0–36.0)
MCV: 80.9 fL (ref 78.0–100.0)
MONO ABS: 0.3 10*3/uL (ref 0.1–1.0)
MPV: 9.2 fL — ABNORMAL LOW (ref 9.4–12.4)
Monocytes Relative: 7 % (ref 3–12)
NEUTROS ABS: 2.9 10*3/uL (ref 1.7–7.7)
Neutrophils Relative %: 61 % (ref 43–77)
Platelets: 189 10*3/uL (ref 150–400)
RBC: 4.65 MIL/uL (ref 3.87–5.11)
RDW: 16.9 % — ABNORMAL HIGH (ref 11.5–15.5)
WBC: 4.8 10*3/uL (ref 4.0–10.5)

## 2014-06-24 LAB — HEMOGLOBIN A1C
HEMOGLOBIN A1C: 10.1 % — AB (ref <5.7)
MEAN PLASMA GLUCOSE: 243 mg/dL — AB (ref <117)

## 2014-06-25 LAB — COMPLETE METABOLIC PANEL WITH GFR
ALBUMIN: 4.1 g/dL (ref 3.5–5.2)
ALT: 40 U/L — ABNORMAL HIGH (ref 0–35)
AST: 33 U/L (ref 0–37)
Alkaline Phosphatase: 138 U/L — ABNORMAL HIGH (ref 39–117)
BUN: 12 mg/dL (ref 6–23)
CHLORIDE: 99 meq/L (ref 96–112)
CO2: 29 mEq/L (ref 19–32)
Calcium: 9.3 mg/dL (ref 8.4–10.5)
Creat: 0.67 mg/dL (ref 0.50–1.10)
GFR, Est African American: >89 mL/min
GFR, Est Non African American: >89 mL/min
Glucose, Bld: 243 mg/dL — ABNORMAL HIGH (ref 70–99)
POTASSIUM: 4.1 meq/L (ref 3.5–5.3)
Sodium: 140 mEq/L (ref 135–145)
Total Bilirubin: 0.3 mg/dL (ref 0.2–1.2)
Total Protein: 6.3 g/dL (ref 6.0–8.3)

## 2014-06-25 LAB — LIPID PANEL
Cholesterol: 141 mg/dL (ref 0–200)
HDL: 86 mg/dL (ref >39)
LDL Cholesterol: 27 mg/dL (ref 0–99)
Total CHOL/HDL Ratio: 1.6 Ratio
Triglycerides: 139 mg/dL (ref <150)
VLDL: 28 mg/dL (ref 0–40)

## 2014-07-11 ENCOUNTER — Other Ambulatory Visit: Payer: Self-pay | Admitting: Physician Assistant

## 2014-07-22 ENCOUNTER — Encounter: Payer: Self-pay | Admitting: Physician Assistant

## 2014-07-22 ENCOUNTER — Other Ambulatory Visit: Payer: Self-pay | Admitting: Physician Assistant

## 2014-07-22 ENCOUNTER — Ambulatory Visit (INDEPENDENT_AMBULATORY_CARE_PROVIDER_SITE_OTHER): Payer: Medicare Other | Admitting: Physician Assistant

## 2014-07-22 VITALS — BP 122/70 | HR 88 | Temp 98.2°F | Resp 18 | Wt 177.0 lb

## 2014-07-22 DIAGNOSIS — L03811 Cellulitis of head [any part, except face]: Secondary | ICD-10-CM

## 2014-07-22 MED ORDER — DOXYCYCLINE HYCLATE 100 MG PO TABS: 100.0000 mg | ORAL_TABLET | Freq: Two times a day (BID) | ORAL | Status: DC

## 2014-07-22 NOTE — Telephone Encounter (Signed)
Medication refilled per protocol. 

## 2014-07-22 NOTE — Progress Notes (Signed)
Patient ID: Michaela CornerJudith M Deming MRN: [id number], DOB: 09/14/1950, 64 y.o. Date of Encounter: 07/22/2014, 10:26 AM    Chief Complaint:  Chief Complaint  Patient presents with  . infection wound top of head    whole head hurts now along with neck and glands are swollen     HPI: 64 y.o. year old female says that she has a bad habit of picking at her skin. As that she had picked at a area of her scalp at the crown of her head and it caused a scabbed sore area at the beginning of the week which was about 3 days ago. Says just over the last 1-2 days she has noticed it getting sore around the area and looking red when she looked at it. Has felt some tender lymph nodes along the back of her neck.     Home Meds:   Outpatient Prescriptions Prior to Visit  Medication Sig Dispense Refill  . aspirin EC 81 MG tablet Take 81 mg by mouth daily.    . Cholecalciferol (VITAMIN D PO) Take 4,000 Int'l Units by mouth daily.    . cyclobenzaprine (FLEXERIL) 10 MG tablet Take 10 mg by mouth 3 (three) times daily.    . famotidine (PEPCID) 20 MG tablet take 1 tablet by mouth once daily 30 tablet 0  . ibuprofen (ADVIL,MOTRIN) 800 MG tablet Take 800 mg by mouth 3 (three) times daily.    Marland Kitchen. levothyroxine (SYNTHROID, LEVOTHROID) 175 MCG tablet TAKE 1 TABLET BY MOUTH DAILY BEFORE BREAKFAST 30 tablet 0  . metFORMIN (GLUCOPHAGE) 1000 MG tablet Take 1 tablet (1,000 mg total) by mouth 2 (two) times daily with a meal. 60 tablet 5  . pravastatin (PRAVACHOL) 40 MG tablet Take 1 tablet (40 mg total) by mouth daily with supper. 30 tablet 5  . sertraline (ZOLOFT) 100 MG tablet Take 1 tablet (100 mg total) by mouth 2 (two) times daily. 60 tablet 5  . gabapentin (NEURONTIN) 300 MG capsule Take 300 mg by mouth 3 (three) times daily.    . ciprofloxacin (CIPRO) 500 MG tablet Take 1 tablet (500 mg total) by mouth 2 (two) times daily. 6 tablet 0   No facility-administered medications prior to visit.    Allergies:  Allergies    Allergen Reactions  . Morphine And Related Itching  . Talwin [Pentazocine] Itching  . Toradol [Ketorolac Tromethamine] Other (See Comments)    Slurred speech, confusion  . Penicillins Rash      Review of Systems: See HPI for pertinent ROS. All other ROS negative.    Physical Exam: Blood pressure 122/70, pulse 88, temperature 98.2 F (36.8 C), temperature source Oral, resp. rate 18, weight 177 lb (80.287 kg)., Body mass index is 35.73 kg/(m^2). General:  WF. Appears in no acute distress. Neck: Supple. No thyromegaly. No lymphadenopathy. Lungs: Clear bilaterally to auscultation without wheezes, rales, or rhonchi. Breathing is unlabored. Heart: Regular rhythm. No murmurs, rubs, or gallops. Msk:  Strength and tone normal for age. Skin:  At "crown of head" --there is an approximate 1/2 cm diameter area of scab. There is an approximate 2 cm area of erythema surrounding this. There is no abscess or mass. Lymph: Bilateral occipital lymph nodes are tender. They're very minimally enlarged it less than 1 cm each. No other tender or enlarged lymph nodes. Neuro: Alert and oriented X 3. Moves all extremities spontaneously. Gait is normal. CNII-XII grossly in tact. Psych:  Responds to questions appropriately with a normal affect.  ASSESSMENT AND PLAN:  64 y.o. year old female with  1. Cellulitis of head or scalp Penicillin allergy so have to avoid Keflex. Told her to start the doxycycline immediately. Follow-up if site worsens at all or if it does not resolve at completion of antibiotic. - doxycycline (VIBRA-TABS) 100 MG tablet; Take 1 tablet (100 mg total) by mouth 2 (two) times daily.  Dispense: 14 tablet; Refill: 0   Signed, [address] [place], [place], [facility] 07/22/2014 10:26 AM

## 2014-07-23 ENCOUNTER — Emergency Department (HOSPITAL_COMMUNITY)
Admission: EM | Admit: 2014-07-23 | Discharge: 2014-07-23 | Disposition: A | Discharge status: Home / Self Care | Payer: Medicare Other | Source: Home / Self Care | Attending: Family Medicine | Admitting: Family Medicine

## 2014-07-23 ENCOUNTER — Encounter (HOSPITAL_COMMUNITY): Payer: Self-pay | Admitting: Family Medicine

## 2014-07-23 DIAGNOSIS — L03811 Cellulitis of head [any part, except face]: Secondary | ICD-10-CM

## 2014-07-23 HISTORY — DX: Hyperlipidemia, unspecified: E78.5

## 2014-07-23 MED ORDER — CLINDAMYCIN HCL 300 MG PO CAPS: 300.0000 mg | ORAL_CAPSULE | Freq: Three times a day (TID) | ORAL | Status: DC

## 2014-07-23 MED ORDER — HYDROCODONE-ACETAMINOPHEN 5-325 MG PO TABS: 1.0000 | ORAL_TABLET | Freq: Four times a day (QID) | ORAL | Status: DC | PRN

## 2014-07-23 NOTE — ED Notes (Signed)
C/o abscess on back/top of scalp onset 2 days PCP placed her on doxycycline yest Reports pain; 8/10 Denies fevers, chills, drainage Alert, no signs of acute distress.

## 2014-07-23 NOTE — Discharge Instructions (Signed)
You had a scalp abscess and cellulitis This was drained in the clinic Please stop the Doxy and start the CLinda Please use the Norco sparingly for the pain We will call you if any further changes need to be made due to the culture report Please continue to work on your diabetes control

## 2014-07-23 NOTE — ED Provider Notes (Signed)
CSN: [id number]     Arrival date & time 07/23/14  1258 History   First MD Initiated Contact with Patient 07/23/14 1316     Chief Complaint  Patient presents with  . Abscess   (Consider location/radiation/quality/duration/timing/severity/associated sxs/prior Treatment) HPI  "Infected cyst on top of head:" going on for a couple of days. PCP started on Doxy yesterday. Not improving. Very painful. No drainage. Denies fevers, CP, WOB, rash.. Swollen glands on posterior of neck. Started as a scab last week. Pt continued to pick at the scab. Poured H2O2 on the area 2 days ago. W/o benefit.   Past Medical History  Diagnosis Date  . PONV (postoperative nausea and vomiting)   . Allergy   . Anxiety 2003  . Depression 1998  . Diabetes mellitus without complication 1998  . GERD (gastroesophageal reflux disease) 1980  . Hyperlipidemia 1990  . Hypothyroidism 1998  . HLD (hyperlipidemia)    Past Surgical History  Procedure Laterality Date  . Tonsillectomy    . Ectopic pregnancy surgery    . Back surgery      x3  . Esophageal dilation      x3  . Hysteroscopy w/d&c N/A 08/17/2013    Procedure: DILATATION AND CURETTAGE /HYSTEROSCOPY;  Surgeon: Mickel Baas, MD;  Location: [facility];  Service: Gynecology;  Laterality: N/A;  YAG LASER  . Cervical polypectomy N/A 08/17/2013    Procedure: CERVICAL POLYPECTOMY;  Surgeon: Mickel Baas, MD;  Location: [facility];  Service: Gynecology;  Laterality: N/A;  . Neck surgery    . Colon surgery  2007    benign mass  . Spine surgery     Family History  Problem Relation Age of Onset  . Depression Mother   . Diabetes Mother   . Hypertension Mother   . Alcohol abuse Father   . Arthritis Father   . Cancer Father 7    Oral Cancer- had tongue, jaw resection--smoker and alcohol  . Alcohol abuse Brother    History  Substance Use Topics  . Smoking status: Never Smoker   . Smokeless tobacco: Never Used  . Alcohol Use: No   OB History    No data available      Review of Systems Per HPI with all other pertinent systems negative.   Allergies  Morphine and related; Talwin; Toradol; and Penicillins  Home Medications   Prior to Admission medications   Medication Sig Start Date End Date Taking? Authorizing Provider  aspirin EC 81 MG tablet Take 81 mg by mouth daily.   Yes Historical Provider, MD  levothyroxine (SYNTHROID, LEVOTHROID) 175 MCG tablet TAKE 1 TABLET BY MOUTH DAILY BEFORE BREAKFAST 07/11/14  Yes Dorena Bodo, PA-C  metFORMIN (GLUCOPHAGE) 1000 MG tablet take 1 tablet by mouth twice a day with meals 07/22/14  Yes Donita Brooks, MD  pravastatin (PRAVACHOL) 40 MG tablet Take 1 tablet (40 mg total) by mouth daily with supper. 12/10/13  Yes [name] B Dixon, PA-C  sertraline (ZOLOFT) 100 MG tablet Take 1 tablet (100 mg total) by mouth 2 (two) times daily. 12/10/13  Yes Dorena Bodo, PA-C  Cholecalciferol (VITAMIN D PO) Take 4,000 Int'l Units by mouth daily. 12/11/13   Historical Provider, MD  clindamycin (CLEOCIN) 300 MG capsule Take 1 capsule (300 mg total) by mouth 3 (three) times daily. 07/23/14   Ozella Rocks, MD  cyclobenzaprine (FLEXERIL) 10 MG tablet Take 10 mg by mouth 3 (three) times daily.    Historical Provider, MD  Empagliflozin-Linagliptin (GLYXAMBI) 25-5 MG TABS Take 1 tablet by mouth daily.    Historical Provider, MD  famotidine (PEPCID) 20 MG tablet TAKE 1 TABLET BY MOUTH ONCE DAILY 07/22/14   Donita BrooksWarren T Pickard, MD  gabapentin (NEURONTIN) 600 MG tablet Take 1 tablet by mouth 3 (three) times daily. 07/16/14   Historical Provider, MD  HYDROcodone-acetaminophen (NORCO/VICODIN) 5-325 MG per tablet Take 1 tablet by mouth every 6 (six) hours as needed for moderate pain. 07/23/14   Ozella Rocksavid J Terrell Ostrand, MD  ibuprofen (ADVIL,MOTRIN) 800 MG tablet Take 800 mg by mouth 3 (three) times daily.    Historical Provider, MD   BP 126/82 mmHg  Pulse 84  Temp(Src) 98 F (36.7 C) (Oral)  Resp 16  SpO2 96% Physical Exam  Constitutional: She is oriented to  person, place, and time. She appears well-developed and well-nourished. No distress.  HENT:  Head: Normocephalic and atraumatic.  Scalp w/ scab and area of induration anf fluctuance to the left of the crown of the pts head. No discharge. Very ttp  Eyes: EOM are normal. Pupils are equal, round, and reactive to light.  Neck: Normal range of motion.  Posterior cervical adenopathy  Cardiovascular: Normal rate and regular rhythm.   Pulmonary/Chest: Effort normal and breath sounds normal.  Abdominal: Soft. Bowel sounds are normal.  Musculoskeletal: Normal range of motion. She exhibits no edema or tenderness.  Neurological: She is oriented to person, place, and time.  Skin: Skin is warm and dry. She is not diaphoretic.  Psychiatric: Her behavior is normal. Judgment and thought content normal.    ED Course  INCISION AND DRAINAGE Date/Time: 07/23/2014 1:45 PM Performed by: Konrad DoloresMERRELL, Pratyush Ammon J Authorized by: Konrad DoloresMERRELL, Bart Ashford J Consent: Verbal consent obtained. Consent given by: patient Patient identity confirmed: arm band Type: abscess Body area: head/neck Location details: scalp Anesthesia method: ethyl chloride. Patient sedated: no Scalpel size: 11 Incision type: single straight Complexity: simple Drainage: purulent Drainage amount: moderate Wound treatment: wound left open Patient tolerance: Patient tolerated the procedure well with no immediate complications   (including critical care time) Labs Review Labs Reviewed - No data to display  Imaging Review No results found.   MDM   1. Abscess or cellulitis of scalp    I&D as above Stop Doxycycline Start Clinda for better soft tissue coverage and inflammatory reduction Unlikely needing pseudomonal coverage but a possibility given elevated sugars as a diabetic abscess Cx sent Norco for pain Precautions given and all questions answered   Shelly Flattenavid Naomi Fitton, MD Family Medicine 07/23/2014, 1:49 PM      Ozella Rocksavid J Jackilyn Umphlett,  MD 07/23/14 1350

## 2014-07-26 LAB — CULTURE, ROUTINE-ABSCESS

## 2014-07-26 NOTE — ED Notes (Signed)
Abscess culture head: Mod. Staph. Aureus.  Pt. adequately treated with I and D and Clindamycin. Vassie MoselleYork, Pecolia Marando M 07/26/2014

## 2014-08-09 ENCOUNTER — Encounter: Payer: Self-pay | Admitting: Physician Assistant

## 2014-08-19 ENCOUNTER — Encounter: Payer: Self-pay | Admitting: Family Medicine

## 2014-08-19 ENCOUNTER — Ambulatory Visit (INDEPENDENT_AMBULATORY_CARE_PROVIDER_SITE_OTHER): Payer: Medicare Other | Admitting: Family Medicine

## 2014-08-19 VITALS — BP 102/62 | HR 96 | Temp 98.1°F | Resp 18 | Ht 59.0 in | Wt 171.0 lb

## 2014-08-19 DIAGNOSIS — Z09 Encounter for follow-up examination after completed treatment for conditions other than malignant neoplasm: Secondary | ICD-10-CM

## 2014-08-19 NOTE — Progress Notes (Signed)
Subjective:    Patient ID: Marissa Thompson, female    DOB: 12/02/1950, 64 y.o.   MRN: [id number]  HPI   patient is here today for hospital discharge follow-up. She was seen in the emergency room in early January for an abscess on the  Crown of her head. Culture revealed staph aureus. This was treated with incision and drainage and then the patient was switched to clindamycin. The lesion has completely resolved. She does have a silver dollar sized patch of alopecia due to the inflammation in that area otherwise there are no residual symptoms. 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 Past Medical History  Diagnosis Date  . PONV (postoperative nausea and vomiting)   . Allergy   . Anxiety 2003  . Depression 1998  . Diabetes mellitus without complication 1998  . GERD (gastroesophageal reflux disease) 1980  . Hyperlipidemia 1990  . Hypothyroidism 1998  . HLD (hyperlipidemia)    Past Surgical History  Procedure Laterality Date  . Tonsillectomy    . Ectopic pregnancy surgery    . Back surgery      x3  . Esophageal dilation      x3  . Hysteroscopy w/d&c N/A 08/17/2013    Procedure: DILATATION AND CURETTAGE /HYSTEROSCOPY;  Surgeon: Mickel Baasichard D Kaplan, MD;  Location: [facility];  Service: Gynecology;  Laterality: N/A;  YAG LASER  . Cervical polypectomy N/A 08/17/2013    Procedure: CERVICAL POLYPECTOMY;  Surgeon: Mickel Baasichard D Kaplan, MD;  Location: [facility];  Service: Gynecology;  Laterality: N/A;  . Neck surgery    . Colon surgery  2007    benign mass  . Spine surgery     Current Outpatient Prescriptions on File Prior to Visit  Medication Sig Dispense Refill  . aspirin EC 81 MG tablet Take 81 mg by mouth daily.    . Cholecalciferol (VITAMIN D PO) Take 4,000 Int'l Units by mouth daily.    . cyclobenzaprine (FLEXERIL) 10 MG tablet Take 10 mg by mouth 3 (three) times daily.    . Empagliflozin-Linagliptin (GLYXAMBI) 25-5 MG TABS Take 1  tablet by mouth daily.    . famotidine (PEPCID) 20 MG tablet TAKE 1 TABLET BY MOUTH ONCE DAILY 30 tablet 2  . gabapentin (NEURONTIN) 600 MG tablet Take 1 tablet by mouth 3 (three) times daily.  0  . HYDROcodone-acetaminophen (NORCO/VICODIN) 5-325 MG per tablet Take 1 tablet by mouth every 6 (six) hours as needed for moderate pain. 10 tablet 0  . ibuprofen (ADVIL,MOTRIN) 800 MG tablet Take 800 mg by mouth 3 (three) times daily.    Marland Kitchen. levothyroxine (SYNTHROID, LEVOTHROID) 175 MCG tablet TAKE 1 TABLET BY MOUTH DAILY BEFORE BREAKFAST 30 tablet 0  . metFORMIN (GLUCOPHAGE) 1000 MG tablet take 1 tablet by mouth twice a day with meals 60 tablet 2  . pravastatin (PRAVACHOL) 40 MG tablet Take 1 tablet (40 mg total) by mouth daily with supper. 30 tablet 5  . sertraline (ZOLOFT) 100 MG tablet Take 1 tablet (100 mg total) by mouth 2 (two) times daily. 60 tablet 5   No current facility-administered medications on file prior to visit.   Allergies  Allergen Reactions  . Morphine And Related Itching  . Talwin [Pentazocine] Itching  . Toradol [Ketorolac Tromethamine] Other (See Comments)    Slurred speech, confusion  . Penicillins Rash   History   Social History  . Marital Status: Legally Separated    Spouse  Name: N/A    Number of Children: N/A  . Years of Education: N/A   Occupational History  . Not on file.   Social History Main Topics  . Smoking status: Never Smoker   . Smokeless tobacco: Never Used  . Alcohol Use: No  . Drug Use: No  . Sexual Activity: Not Currently   Other Topics Concern  . Not on file   Social History Narrative   Separated. Son lives with her.    On Disability--secondary to back. On disability since 2012.   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    Never smoked.      Review of Systems  All other systems reviewed and are negative.      Objective:   Physical  Exam  Cardiovascular: Normal rate and regular rhythm.   Pulmonary/Chest: Effort normal and breath sounds normal.  Skin: No rash noted. No erythema.  Vitals reviewed.  does have an area that is approximately 5 cm x 4 cm in size which is located on the crown of her head that has alopecia        Assessment & Plan:  Hospital discharge follow-up   we discussed the natural history of staph. The patient is likely colonized in her nose. I splinted the patient that any future infection should be managed for staph. I would also recommend immediate incision and drainage of any future abscess  To facilitate resolution of the infection. We discussed strategies for prevention.Marland Kitchen

## 2014-09-02 ENCOUNTER — Other Ambulatory Visit: Payer: Medicare Other

## 2014-09-02 DIAGNOSIS — E039 Hypothyroidism, unspecified: Secondary | ICD-10-CM

## 2014-09-02 DIAGNOSIS — Z79899 Other long term (current) drug therapy: Secondary | ICD-10-CM

## 2014-09-02 LAB — TSH: TSH: 0.219 u[IU]/mL — ABNORMAL LOW (ref 0.350–4.500)

## 2014-09-06 ENCOUNTER — Telehealth: Payer: Self-pay | Admitting: Family Medicine

## 2014-09-06 MED ORDER — LEVOTHYROXINE SODIUM 175 MCG PO TABS: 175.0000 ug | ORAL_TABLET | Freq: Every day | ORAL | Status: DC

## 2014-09-06 NOTE — Telephone Encounter (Signed)
-----   Message from Dorena BodoMary B Dixon, PA-C sent at 09/03/2014  7:04 PM EST ----- Continue current dose of thyroid medication. Recheck 6 months.

## 2014-09-06 NOTE — Telephone Encounter (Signed)
Refill has been sent.  Waiting for pt to call back for results

## 2014-09-07 ENCOUNTER — Encounter: Payer: Self-pay | Admitting: Family Medicine

## 2014-09-09 ENCOUNTER — Telehealth: Payer: Self-pay | Admitting: *Deleted

## 2014-09-09 NOTE — Telephone Encounter (Signed)
Pt came into office today in reference to her lab results for TSH, pt wanted to know why her A1C was not checked when she had the TSH checked. I looked at pt's chart and saw that she had all her other blood work done 06/24/14 and that the reason was because her insurance will not pay if it was before 3 mos, pt understood. Also, pt inquired about her Levothyroxine 175mcg and states that she had just started taking half of the pill after she saw her lab results and will wait until her 3 month follow up to discuss.  Pt is out of her glyxambi 25/5mg  tablets gave pt samples enough until her appt in March.

## 2014-09-15 ENCOUNTER — Telehealth: Payer: Self-pay | Admitting: Family Medicine

## 2014-09-15 ENCOUNTER — Encounter: Payer: Self-pay | Admitting: Physician Assistant

## 2014-09-15 MED ORDER — SERTRALINE HCL 100 MG PO TABS: 100.0000 mg | ORAL_TABLET | Freq: Two times a day (BID) | ORAL | Status: DC

## 2014-09-15 NOTE — Telephone Encounter (Signed)
Medication refilled per protocol. 

## 2014-09-24 ENCOUNTER — Other Ambulatory Visit: Payer: Self-pay | Admitting: Family Medicine

## 2014-09-24 ENCOUNTER — Other Ambulatory Visit: Payer: Medicare Other

## 2014-09-24 DIAGNOSIS — E785 Hyperlipidemia, unspecified: Secondary | ICD-10-CM

## 2014-09-24 DIAGNOSIS — I10 Essential (primary) hypertension: Secondary | ICD-10-CM

## 2014-09-24 DIAGNOSIS — Z79899 Other long term (current) drug therapy: Secondary | ICD-10-CM

## 2014-09-24 DIAGNOSIS — IMO0002 Reserved for concepts with insufficient information to code with codable children: Secondary | ICD-10-CM

## 2014-09-24 DIAGNOSIS — E1165 Type 2 diabetes mellitus with hyperglycemia: Secondary | ICD-10-CM

## 2014-09-27 ENCOUNTER — Ambulatory Visit: Payer: Medicare Other | Admitting: Family Medicine

## 2014-10-01 ENCOUNTER — Ambulatory Visit: Payer: Medicare Other | Admitting: Gastroenterology

## 2014-10-06 ENCOUNTER — Other Ambulatory Visit: Payer: Medicare Other

## 2014-10-06 DIAGNOSIS — E1165 Type 2 diabetes mellitus with hyperglycemia: Secondary | ICD-10-CM

## 2014-10-06 DIAGNOSIS — I10 Essential (primary) hypertension: Secondary | ICD-10-CM

## 2014-10-06 DIAGNOSIS — IMO0002 Reserved for concepts with insufficient information to code with codable children: Secondary | ICD-10-CM

## 2014-10-06 DIAGNOSIS — E785 Hyperlipidemia, unspecified: Secondary | ICD-10-CM

## 2014-10-06 DIAGNOSIS — Z79899 Other long term (current) drug therapy: Secondary | ICD-10-CM

## 2014-10-06 LAB — COMPLETE METABOLIC PANEL WITH GFR
ALT: 32 U/L (ref 0–35)
AST: 33 U/L (ref 0–37)
Albumin: 4.1 g/dL (ref 3.5–5.2)
Alkaline Phosphatase: 145 U/L — ABNORMAL HIGH (ref 39–117)
BUN: 12 mg/dL (ref 6–23)
CALCIUM: 9.5 mg/dL (ref 8.4–10.5)
CHLORIDE: 100 meq/L (ref 96–112)
CO2: 25 mEq/L (ref 19–32)
Creat: 0.72 mg/dL (ref 0.50–1.10)
GFR, Est Non African American: >89 mL/min
Glucose, Bld: 185 mg/dL — ABNORMAL HIGH (ref 70–99)
POTASSIUM: 4.8 meq/L (ref 3.5–5.3)
Sodium: 138 mEq/L (ref 135–145)
Total Bilirubin: 0.3 mg/dL (ref 0.2–1.2)
Total Protein: 6.8 g/dL (ref 6.0–8.3)

## 2014-10-06 LAB — LIPID PANEL
CHOL/HDL RATIO: 1.8 ratio
Cholesterol: 188 mg/dL (ref 0–200)
HDL: 102 mg/dL (ref >=46)
LDL Cholesterol: 55 mg/dL (ref 0–99)
Triglycerides: 153 mg/dL — ABNORMAL HIGH (ref <150)
VLDL: 31 mg/dL (ref 0–40)

## 2014-10-06 LAB — HEMOGLOBIN A1C
Hgb A1c MFr Bld: 8.7 % — ABNORMAL HIGH (ref <5.7)
Mean Plasma Glucose: 203 mg/dL — ABNORMAL HIGH (ref <117)

## 2014-10-13 ENCOUNTER — Other Ambulatory Visit: Payer: Self-pay | Admitting: Family Medicine

## 2014-10-13 MED ORDER — FAMOTIDINE 20 MG PO TABS: 20.0000 mg | ORAL_TABLET | Freq: Every day | ORAL | Status: DC

## 2014-10-13 MED ORDER — METFORMIN HCL 1000 MG PO TABS: 1000.0000 mg | ORAL_TABLET | Freq: Two times a day (BID) | ORAL | Status: DC

## 2014-10-13 NOTE — Telephone Encounter (Signed)
Med sent to pharm 

## 2014-10-15 ENCOUNTER — Ambulatory Visit: Payer: Self-pay | Admitting: Family Medicine

## 2014-10-18 ENCOUNTER — Ambulatory Visit (INDEPENDENT_AMBULATORY_CARE_PROVIDER_SITE_OTHER): Payer: Medicare Other | Admitting: Family Medicine

## 2014-10-18 ENCOUNTER — Encounter: Payer: Self-pay | Admitting: Physician Assistant

## 2014-10-18 VITALS — BP 128/64 | HR 86 | Temp 98.4°F | Resp 18 | Ht 59.0 in | Wt 169.0 lb

## 2014-10-18 DIAGNOSIS — IMO0002 Reserved for concepts with insufficient information to code with codable children: Secondary | ICD-10-CM

## 2014-10-18 DIAGNOSIS — E1165 Type 2 diabetes mellitus with hyperglycemia: Secondary | ICD-10-CM

## 2014-10-18 MED ORDER — EMPAGLIFLOZIN-LINAGLIPTIN 25-5 MG PO TABS: 1.0000 | ORAL_TABLET | Freq: Every day | ORAL | Status: DC

## 2014-10-18 MED ORDER — PIOGLITAZONE HCL 15 MG PO TABS: 15.0000 mg | ORAL_TABLET | Freq: Every day | ORAL | Status: DC

## 2014-10-18 NOTE — Progress Notes (Signed)
Subjective:    Patient ID: Marissa Thompson, female    DOB: 01-29-1951, 64 y.o.   MRN: [id number]  HPI Patient was seen in 12/15 and found to have HgA1c of 10.1.  Glyxambi 5/25 poqday was initiated.  HgA1c 3 months later was found to be 8.7.  However the patient has not been taking the glyxambi regularly:  Appointment on 10/06/2014  Component Date Value Ref Range Status  . Sodium 10/06/2014 138  135 - 145 mEq/L Final  . Potassium 10/06/2014 4.8  3.5 - 5.3 mEq/L Final  . Chloride 10/06/2014 100  96 - 112 mEq/L Final  . CO2 10/06/2014 25  19 - 32 mEq/L Final  . Glucose, Bld 10/06/2014 185* 70 - 99 mg/dL Final  . BUN 10/06/2014 12  6 - 23 mg/dL Final  . Creat 10/06/2014 0.72  0.50 - 1.10 mg/dL Final  . Total Bilirubin 10/06/2014 0.3  0.2 - 1.2 mg/dL Final  . Alkaline Phosphatase 10/06/2014 145* 39 - 117 U/L Final  . AST 10/06/2014 33  0 - 37 U/L Final  . ALT 10/06/2014 32  0 - 35 U/L Final  . Total Protein 10/06/2014 6.8  6.0 - 8.3 g/dL Final  . Albumin 10/06/2014 4.1  3.5 - 5.2 g/dL Final  . Calcium 10/06/2014 9.5  8.4 - 10.5 mg/dL Final  . GFR, Est African American 10/06/2014 >89   Final  . GFR, Est Non African American 10/06/2014 >89   Final   Comment:   The estimated GFR is a calculation valid for adults (>=69 years old) that uses the CKD-EPI algorithm to adjust for age and sex. It is   not to be used for children, pregnant women, hospitalized patients,    patients on dialysis, or with rapidly changing kidney function. According to the NKDEP, eGFR >89 is normal, 60-89 shows mild impairment, 30-59 shows moderate impairment, 15-29 shows severe impairment and <15 is ESRD.     Marland Kitchen Cholesterol 10/06/2014 188  0 - 200 mg/dL Final   Comment: ATP III Classification:       < 200        mg/dL        Desirable      200 - 239     mg/dL        Borderline High      >= 240        mg/dL        High     . Triglycerides 10/06/2014 153* <150 mg/dL Final  . HDL 10/06/2014 102  >=46 mg/dL Final     ** Please note change in reference range(s). **  . Total CHOL/HDL Ratio 10/06/2014 1.8   Final  . VLDL 10/06/2014 31  0 - 40 mg/dL Final  . LDL Cholesterol 10/06/2014 55  0 - 99 mg/dL Final   Comment:   Total Cholesterol/HDL Ratio:CHD Risk                        Coronary Heart Disease Risk Table                                        Men       Women          1/2 Average Risk              3.4  3.3              Average Risk              5.0        4.4           2X Average Risk              9.6        7.1           3X Average Risk             23.4       11.0 Use the calculated Patient Ratio above and the CHD Risk table  to determine the patient's CHD Risk. ATP III Classification (LDL):       < 100        mg/dL         Optimal      100 - 129     mg/dL         Near or Above Optimal      130 - 159     mg/dL         Borderline High      160 - 189     mg/dL         High       > 190        mg/dL         Very High     . Hgb A1c MFr Bld 10/06/2014 8.7* <5.7 % Final   Comment:                                                                        According to the ADA Clinical Practice Recommendations for 2011, when HbA1c is used as a screening test:     >=6.5%   Diagnostic of Diabetes Mellitus            (if abnormal result is confirmed)   5.7-6.4%   Increased risk of developing Diabetes Mellitus   References:Diagnosis and Classification of Diabetes Mellitus,Diabetes JOIN,8676,72(CNOBS 1):S62-S69 and Standards of Medical Care in         Diabetes - 2011,Diabetes JGGE,3662,94 (Suppl 1):S11-S61.     . Mean Plasma Glucose 10/06/2014 203* <117 mg/dL Final   She is already on max dose metformin 1000 mg pobid. Past Medical History  Diagnosis Date  . PONV (postoperative nausea and vomiting)   . Allergy   . Anxiety 2003  . Depression 1998  . Diabetes mellitus without complication 7654  . GERD (gastroesophageal reflux disease) 1980  . Hyperlipidemia 1990  . Hypothyroidism 1998  .  HLD (hyperlipidemia)    Past Surgical History  Procedure Laterality Date  . Tonsillectomy    . Ectopic pregnancy surgery    . Back surgery      x3  . Esophageal dilation      x3  . Hysteroscopy w/d&c N/A 08/17/2013    Procedure: DILATATION AND CURETTAGE /HYSTEROSCOPY;  Surgeon: Sharene Butters, MD;  Location: [facility];  Service: Gynecology;  Laterality: N/A;  YAG LASER  . Cervical polypectomy N/A 08/17/2013    Procedure: CERVICAL POLYPECTOMY;  Surgeon: Sharene Butters, MD;  Location: [facility];  Service: Gynecology;  Laterality: N/A;  . Neck surgery    . Colon surgery  2007    benign mass  . Spine surgery     Current Outpatient Prescriptions on File Prior to Visit  Medication Sig Dispense Refill  . aspirin EC 81 MG tablet Take 81 mg by mouth daily.    . Cholecalciferol (VITAMIN D PO) Take 4,000 Int'l Units by mouth daily.    . cyclobenzaprine (FLEXERIL) 10 MG tablet Take 10 mg by mouth 3 (three) times daily.    . Empagliflozin-Linagliptin (GLYXAMBI) 25-5 MG TABS Take 1 tablet by mouth daily.    . famotidine (PEPCID) 20 MG tablet Take 1 tablet (20 mg total) by mouth daily. 30 tablet 11  . gabapentin (NEURONTIN) 600 MG tablet Take 1 tablet by mouth 3 (three) times daily.  0  . HYDROcodone-acetaminophen (NORCO/VICODIN) 5-325 MG per tablet Take 1 tablet by mouth every 6 (six) hours as needed for moderate pain. 10 tablet 0  . ibuprofen (ADVIL,MOTRIN) 800 MG tablet Take 800 mg by mouth 3 (three) times daily.    Marland Kitchen levothyroxine (SYNTHROID, LEVOTHROID) 175 MCG tablet Take 1 tablet (175 mcg total) by mouth daily before breakfast. 30 tablet 5  . metFORMIN (GLUCOPHAGE) 1000 MG tablet Take 1 tablet (1,000 mg total) by mouth 2 (two) times daily with a meal. 60 tablet 3  . pravastatin (PRAVACHOL) 40 MG tablet Take 1 tablet (40 mg total) by mouth daily with supper. 30 tablet 5  . sertraline (ZOLOFT) 100 MG tablet Take 1 tablet (100 mg total) by mouth 2 (two) times daily. 60 tablet 0   No current  facility-administered medications on file prior to visit.   Allergies  Allergen Reactions  . Morphine And Related Itching  . Talwin [Pentazocine] Itching  . Toradol [Ketorolac Tromethamine] Other (See Comments)    Slurred speech, confusion  . Penicillins Rash   History   Social History  . Marital Status: Legally Separated    Spouse Name: N/A  . Number of Children: N/A  . Years of Education: N/A   Occupational History  . Not on file.   Social History Main Topics  . Smoking status: Never Smoker   . Smokeless tobacco: Never Used  . Alcohol Use: No  . Drug Use: No  . Sexual Activity: Not Currently   Other Topics Concern  . Not on file   Social History Narrative   Separated. Son lives with her.    On Disability--secondary to back. On disability since 2012.   Did work as a Marine scientist at [employer]   Did drink heavy alcohol until October 1997- Quit and NO alcohol since.    Never smoked.       Review of Systems  All other systems reviewed and are negative.      Objective:   Physical Exam  Constitutional: She appears well-developed and well-nourished.  Cardiovascular: Normal rate, regular rhythm and normal heart sounds.  Exam reveals no gallop and no friction rub.   No murmur heard. Pulmonary/Chest: Effort normal and breath sounds normal. No respiratory distress. She has no wheezes. She has no rales. She exhibits no tenderness.  Abdominal: Soft. Bowel sounds are normal. She exhibits no distension and no mass. There is no tenderness. There is no rebound and no guarding.  Musculoskeletal: She exhibits no tenderness.  Vitals reviewed.         Assessment & Plan:  Diabetes mellitus type II, uncontrolled - Plan: Microalbumin, urine : Schedule the patient to see  an ophthalmologist. The foot exam is performed today. Diabetic eye exam is overdue. I recommended the patient take the medication regularly as scheduled. I will also supplement with  Actos 15 mg by mouth daily. I recommended increasing aerobic exercise and weight loss. Continue metformin. Recheck blood sugars in 3 months.

## 2014-10-22 ENCOUNTER — Telehealth: Payer: Self-pay | Admitting: Family Medicine

## 2014-10-22 NOTE — Telephone Encounter (Signed)
PA submitted through [email or web address]  

## 2014-10-25 ENCOUNTER — Encounter: Payer: Self-pay | Admitting: Family Medicine

## 2014-10-25 ENCOUNTER — Telehealth: Payer: Self-pay | Admitting: *Deleted

## 2014-10-25 NOTE — Telephone Encounter (Signed)
This is in addition to another note you have on her as well

## 2014-10-25 NOTE — Telephone Encounter (Signed)
This has been denied by insurance - other recommendations?

## 2014-10-25 NOTE — Telephone Encounter (Signed)
[facility] stating that Marissa Thompson has been Denied states taht there is an alternative which include Invokana,onglyaza, adn Januvia  If any questions please call Marissa DandyMary at [phone]

## 2014-10-25 NOTE — Telephone Encounter (Signed)
There is NO such alternative that includes them.  She will have to take invokana 300 mg poqday and januvia 100 mg poqday IN PLACE of Glyxambi

## 2014-10-25 NOTE — Telephone Encounter (Signed)
Pt aware via my chart and will await an answer if she is ok with taking those 2 pills then will send to her pharm.

## 2014-11-01 MED ORDER — SITAGLIPTIN PHOSPHATE 100 MG PO TABS: 100.0000 mg | ORAL_TABLET | Freq: Every day | ORAL | Status: DC

## 2014-11-01 MED ORDER — CANAGLIFLOZIN 300 MG PO TABS: 300.0000 mg | ORAL_TABLET | Freq: Every day | ORAL | Status: DC

## 2014-11-01 NOTE — Telephone Encounter (Signed)
See note from 10/25/14 - meds sent to pharm and pt aware.

## 2014-11-03 ENCOUNTER — Encounter: Payer: Self-pay | Admitting: Gastroenterology

## 2014-11-04 ENCOUNTER — Other Ambulatory Visit: Payer: Self-pay | Admitting: Physician Assistant

## 2014-11-04 NOTE — Telephone Encounter (Signed)
Medication refilled per protocol. 

## 2014-11-11 ENCOUNTER — Telehealth: Payer: Self-pay | Admitting: Physician Assistant

## 2014-11-11 NOTE — Telephone Encounter (Signed)
[phone]  PT is needing testing strips called in for the  precision extra meter  [facility]

## 2014-11-12 MED ORDER — GLUCOSE BLOOD VI STRP: ORAL_STRIP | Status: DC

## 2014-11-16 ENCOUNTER — Telehealth: Payer: Self-pay | Admitting: Physician Assistant

## 2014-11-16 NOTE — Telephone Encounter (Signed)
[phone]  Patient is calling to say that rx for januvia and invokana is too expensive  Please call her back regarding this

## 2014-11-17 ENCOUNTER — Encounter: Payer: Self-pay | Admitting: Physician Assistant

## 2014-11-17 MED ORDER — GLUCOSE BLOOD VI STRP: ORAL_STRIP | Status: DC

## 2014-11-17 NOTE — Telephone Encounter (Signed)
Pt states Invokana and Januvia are too expensive. Can not afford.  Has been taking Metformin BID as ordered and taking the Actos once daily in the evening.  BS last two mornings have been 109 and 99.  Prior days [phone].  Please advise??

## 2014-11-18 NOTE — Telephone Encounter (Signed)
Dc januvia and invokana.  Begin glipizide xr 5 mg poqday and recheck in 3 months.

## 2014-11-23 ENCOUNTER — Encounter: Payer: Self-pay | Admitting: Family Medicine

## 2014-11-23 ENCOUNTER — Ambulatory Visit (INDEPENDENT_AMBULATORY_CARE_PROVIDER_SITE_OTHER): Payer: Medicare Other | Admitting: Family Medicine

## 2014-11-23 VITALS — BP 98/56 | HR 86 | Temp 98.5°F | Resp 16 | Ht 59.0 in | Wt 172.0 lb

## 2014-11-23 DIAGNOSIS — L03032 Cellulitis of left toe: Secondary | ICD-10-CM | POA: Diagnosis not present

## 2014-11-23 MED ORDER — SULFAMETHOXAZOLE-TRIMETHOPRIM 800-160 MG PO TABS: 1.0000 | ORAL_TABLET | Freq: Two times a day (BID) | ORAL | Status: DC

## 2014-11-23 MED ORDER — GLIPIZIDE ER 5 MG PO TB24: 5.0000 mg | ORAL_TABLET | Freq: Every day | ORAL | Status: DC

## 2014-11-23 NOTE — Progress Notes (Signed)
Subjective:    Patient ID: Marissa HewsJudith Ostrand, female    DOB: 07/05/1951, 64 y.o.   MRN: [id number]  HPI Patient has a past medical history of poorly controlled diabetes mellitus type 2. In fact she recently discontinued in the coronal and Januvia due to cost. She has not yet started glipizide extended release 5 mg by mouth daily to replace them. A few days ago, the third toe on her left foot became erythematous tender and warm. The erythema is around her toenail at the distal portion of the toe and extends along the lateral side of the toe to the PIP joint. The skin is warm. There is no obvious ulcer although there is a callus on the tip of her toe that may have been the portal of entry. Past Medical History  Diagnosis Date  . PONV (postoperative nausea and vomiting)   . Allergy   . Anxiety 2003  . Depression 1998  . Diabetes mellitus without complication 1998  . GERD (gastroesophageal reflux disease) 1980  . Hyperlipidemia 1990  . Hypothyroidism 1998  . HLD (hyperlipidemia)    Past Surgical History  Procedure Laterality Date  . Tonsillectomy    . Ectopic pregnancy surgery    . Back surgery      x3  . Esophageal dilation      x3  . Hysteroscopy w/d&c N/A 08/17/2013    Procedure: DILATATION AND CURETTAGE /HYSTEROSCOPY;  Surgeon: Mickel Baasichard D Kaplan, MD;  Location: [facility];  Service: Gynecology;  Laterality: N/A;  YAG LASER  . Cervical polypectomy N/A 08/17/2013    Procedure: CERVICAL POLYPECTOMY;  Surgeon: Mickel Baasichard D Kaplan, MD;  Location: [facility];  Service: Gynecology;  Laterality: N/A;  . Neck surgery    . Colon surgery  2007    benign mass  . Spine surgery     Current Outpatient Prescriptions on File Prior to Visit  Medication Sig Dispense Refill  . aspirin EC 81 MG tablet Take 81 mg by mouth daily.    . Cholecalciferol (VITAMIN D PO) Take 4,000 Int'l Units by mouth daily.    . cyclobenzaprine (FLEXERIL) 10 MG tablet Take 10 mg by mouth 3 (three) times daily.    . famotidine (PEPCID)  20 MG tablet Take 1 tablet (20 mg total) by mouth daily. 30 tablet 11  . gabapentin (NEURONTIN) 600 MG tablet Take 1 tablet by mouth 3 (three) times daily.  0  . glucose blood test strip Precision Extra Meter - check BS bid - DX: e11.9 100 each 5  . HYDROcodone-acetaminophen (NORCO/VICODIN) 5-325 MG per tablet Take 1 tablet by mouth every 6 (six) hours as needed for moderate pain. 10 tablet 0  . ibuprofen (ADVIL,MOTRIN) 800 MG tablet Take 800 mg by mouth 3 (three) times daily.    Marland Kitchen. levothyroxine (SYNTHROID, LEVOTHROID) 175 MCG tablet Take 1 tablet (175 mcg total) by mouth daily before breakfast. 30 tablet 5  . metFORMIN (GLUCOPHAGE) 1000 MG tablet Take 1 tablet (1,000 mg total) by mouth 2 (two) times daily with a meal. 60 tablet 3  . pioglitazone (ACTOS) 15 MG tablet Take 1 tablet (15 mg total) by mouth daily. 30 tablet 5  . pravastatin (PRAVACHOL) 40 MG tablet Take 1 tablet (40 mg total) by mouth daily with supper. 30 tablet 5  . sertraline (ZOLOFT) 100 MG tablet take 1 tablet by mouth twice a day 60 tablet 5   No current facility-administered medications on file prior to visit.   Allergies  Allergen Reactions  .  Morphine And Related Itching  . Talwin [Pentazocine] Itching  . Toradol [Ketorolac Tromethamine] Other (See Comments)    Slurred speech, confusion  . Penicillins Rash   History   Social History  . Marital Status: Legally Separated    Spouse Name: N/A  . Number of Children: N/A  . Years of Education: N/A   Occupational History  . Not on file.   Social History Main Topics  . Smoking status: Never Smoker   . Smokeless tobacco: Never Used  . Alcohol Use: No  . Drug Use: No  . Sexual Activity: Not Currently   Other Topics Concern  . Not on file   Social History Narrative   Separated. Son lives with her.    On Disability--secondary to back. On disability since 2012.   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    Never smoked.       Review of Systems  All other systems reviewed and are negative.      Objective:   Physical Exam  Cardiovascular: Normal rate, regular rhythm and normal heart sounds.   Pulmonary/Chest: Effort normal and breath sounds normal.  Abdominal: Soft. Bowel sounds are normal.  Skin: Skin is warm. There is erythema.  Vitals reviewed.         Assessment & Plan:  Cellulitis of toe of left foot - Plan: sulfamethoxazole-trimethoprim (BACTRIM DS,SEPTRA DS) 800-160 MG per tablet  Patient has developed cellulitis in the toe. She is allergic to penicillin. Therefore I will start the patient on Bactrim double strength 1 tablet by mouth twice a day for 7 days. This will also afford us coverage of staph aureus. Recheck in one week or sooner if worse. Begin glipizide extended release 5 mg by mouth daily for diabetes and recheck a hemoglobin A1c in 3 months

## 2014-11-24 NOTE — Telephone Encounter (Signed)
Still trying to reach patient.  Not returning any of my calls.  Left another message today.

## 2014-12-01 NOTE — Telephone Encounter (Signed)
addessed at OV 5/10

## 2014-12-21 ENCOUNTER — Encounter: Payer: Self-pay | Admitting: Physician Assistant

## 2014-12-28 ENCOUNTER — Encounter: Payer: Self-pay | Admitting: Family Medicine

## 2014-12-28 ENCOUNTER — Ambulatory Visit (INDEPENDENT_AMBULATORY_CARE_PROVIDER_SITE_OTHER): Payer: Medicare Other | Admitting: Family Medicine

## 2014-12-28 VITALS — BP 100/68 | HR 84 | Temp 98.2°F | Resp 18 | Ht 59.0 in | Wt 173.0 lb

## 2014-12-28 DIAGNOSIS — R413 Other amnesia: Secondary | ICD-10-CM | POA: Diagnosis not present
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
## 2014-12-29 ENCOUNTER — Ambulatory Visit (INDEPENDENT_AMBULATORY_CARE_PROVIDER_SITE_OTHER): Payer: Medicare Other | Admitting: Gastroenterology

## 2014-12-29 ENCOUNTER — Encounter: Payer: Self-pay | Admitting: Gastroenterology

## 2014-12-29 VITALS — BP 142/82 | HR 80 | Ht 59.0 in | Wt 172.0 lb

## 2014-12-29 DIAGNOSIS — R1314 Dysphagia, pharyngoesophageal phase: Secondary | ICD-10-CM

## 2014-12-29 DIAGNOSIS — K219 Gastro-esophageal reflux disease without esophagitis: Secondary | ICD-10-CM

## 2014-12-29 LAB — CBC WITH DIFFERENTIAL/PLATELET
BASOS ABS: 0 10*3/uL (ref 0.0–0.1)
BASOS PCT: 0 % (ref 0–1)
Eosinophils Absolute: 0.1 10*3/uL (ref 0.0–0.7)
Eosinophils Relative: 1 % (ref 0–5)
HEMATOCRIT: 38.4 % (ref 36.0–46.0)
Hemoglobin: 12.4 g/dL (ref 12.0–15.0)
LYMPHS ABS: 1.7 10*3/uL (ref 0.7–4.0)
Lymphocytes Relative: 31 % (ref 12–46)
MCH: 26.6 pg (ref 26.0–34.0)
MCHC: 32.3 g/dL (ref 30.0–36.0)
MCV: 82.4 fL (ref 78.0–100.0)
MONOS PCT: 5 % (ref 3–12)
MPV: 9.1 fL (ref 8.6–12.4)
Monocytes Absolute: 0.3 10*3/uL (ref 0.1–1.0)
NEUTROS ABS: 3.5 10*3/uL (ref 1.7–7.7)
Neutrophils Relative %: 63 % (ref 43–77)
Platelets: 214 10*3/uL (ref 150–400)
RBC: 4.66 MIL/uL (ref 3.87–5.11)
RDW: 17.2 % — AB (ref 11.5–15.5)
WBC: 5.6 10*3/uL (ref 4.0–10.5)

## 2014-12-29 LAB — COMPLETE METABOLIC PANEL WITH GFR
ALT: 26 U/L (ref 0–35)
AST: 33 U/L (ref 0–37)
Albumin: 4 g/dL (ref 3.5–5.2)
Alkaline Phosphatase: 109 U/L (ref 39–117)
BILIRUBIN TOTAL: 0.3 mg/dL (ref 0.2–1.2)
BUN: 14 mg/dL (ref 6–23)
CO2: 25 mEq/L (ref 19–32)
Calcium: 8.9 mg/dL (ref 8.4–10.5)
Chloride: 102 mEq/L (ref 96–112)
Creat: 0.83 mg/dL (ref 0.50–1.10)
GFR, EST NON AFRICAN AMERICAN: 76 mL/min
GFR, Est African American: 87 mL/min
GLUCOSE: 121 mg/dL — AB (ref 70–99)
POTASSIUM: 3.8 meq/L (ref 3.5–5.3)
SODIUM: 136 meq/L (ref 135–145)
Total Protein: 6.8 g/dL (ref 6.0–8.3)

## 2014-12-29 LAB — RPR

## 2014-12-29 LAB — TSH: TSH: 3.292 u[IU]/mL (ref 0.350–4.500)

## 2014-12-29 LAB — VITAMIN B12: Vitamin B-12: 517 pg/mL (ref 211–911)

## 2014-12-29 MED ORDER — PANTOPRAZOLE SODIUM 40 MG PO TBEC: 40.0000 mg | DELAYED_RELEASE_TABLET | Freq: Every day | ORAL | Status: DC

## 2014-12-29 NOTE — Assessment & Plan Note (Addendum)
With history of esophageal stricture I strongly suspect that she is symptomatic from a recurrent stricture.  Recommendations #1 EGD with dilation as indicated to obtain prior records  CC Dr. Tanya Nones

## 2014-12-29 NOTE — Assessment & Plan Note (Signed)
She is symptomatic despite Pepcid.    Recommendations #1 switch from Pepcid to Protonix 40 mg daily

## 2014-12-29 NOTE — Progress Notes (Signed)
_                                                                                                                History of Present Illness:  Marissa Thompson is a 64 year old white female referred at the request of Dr. Tanya Nones for evaluation of dysphagia.  She has a history of an esophageal stricture for which she is been dilated several times in the past, the last time approximately 3 years ago.  The past year she's had dysphagia to solids.  She also has some mild lower chest discomfort with swallowing.  She has intermittent pyrosis despite taking Pepcid.  The patient also has a history of a "a colonic mass" dose resected in 2007 in [place].  She underwent colonoscopy 3 years ago and states that the exam was normal.  She has no lower GI complaints.   Past Medical History  Diagnosis Date  . PONV (postoperative nausea and vomiting)   . Allergy   . Anxiety 2003  . Depression 1998  . Diabetes mellitus without complication 1998  . GERD (gastroesophageal reflux disease) 1980  . Hyperlipidemia 1990  . Hypothyroidism 1998  . HLD (hyperlipidemia)    Past Surgical History  Procedure Laterality Date  . Tonsillectomy    . Ectopic pregnancy surgery    . Back surgery      x3  . Esophageal dilation      x3  . Hysteroscopy w/d&c N/A 08/17/2013    Procedure: DILATATION AND CURETTAGE /HYSTEROSCOPY;  Surgeon: Mickel Baas, MD;  Location: [facility];  Service: Gynecology;  Laterality: N/A;  YAG LASER  . Cervical polypectomy N/A 08/17/2013    Procedure: CERVICAL POLYPECTOMY;  Surgeon: Mickel Baas, MD;  Location: [facility];  Service: Gynecology;  Laterality: N/A;  . Neck surgery    . Colon surgery  2007    benign mass  . Spine surgery     family history includes Alcohol abuse in her brother and father; Arthritis in her father; Cancer (age of onset: 38) in her father; Depression in her mother; Diabetes in her mother; Hypertension in her mother. Current Outpatient Prescriptions    Medication Sig Dispense Refill  . aspirin EC 81 MG tablet Take 81 mg by mouth daily.    . Cholecalciferol (VITAMIN D PO) Take 4,000 Int'l Units by mouth daily.    . famotidine (PEPCID) 20 MG tablet Take 1 tablet (20 mg total) by mouth daily. 30 tablet 11  . gabapentin (NEURONTIN) 600 MG tablet Take 1 tablet by mouth 3 (three) times daily.  0  . glipiZIDE (GLUCOTROL XL) 5 MG 24 hr tablet Take 1 tablet (5 mg total) by mouth daily with breakfast. 30 tablet 3  . glucose blood test strip Precision Extra Meter - check BS bid - DX: e11.9 100 each 5  . ibuprofen (ADVIL,MOTRIN) 800 MG tablet Take 800 mg by mouth 3 (three) times daily.    Marland Kitchen levothyroxine (SYNTHROID, LEVOTHROID) 175 MCG tablet Take 1 tablet (175 mcg total) by  mouth daily before breakfast. 30 tablet 5  . metFORMIN (GLUCOPHAGE) 1000 MG tablet Take 1 tablet (1,000 mg total) by mouth 2 (two) times daily with a meal. 60 tablet 3  . pravastatin (PRAVACHOL) 40 MG tablet Take 1 tablet (40 mg total) by mouth daily with supper. 30 tablet 5  . sertraline (ZOLOFT) 100 MG tablet take 1 tablet by mouth twice a day 60 tablet 5  . cyclobenzaprine (FLEXERIL) 10 MG tablet Take 10 mg by mouth 3 (three) times daily.     No current facility-administered medications for this visit.   Allergies as of 12/29/2014 - Review Complete 12/29/2014  Allergen Reaction Noted  . Baclofen Other (See Comments) 12/28/2014  . Morphine and related Itching 08/03/2013  . Talwin [pentazocine] Itching 08/03/2013  . Toradol [ketorolac tromethamine] Other (See Comments) 09/18/2013  . Penicillins Rash 08/03/2013    reports that she has never smoked. She has never used smokeless tobacco. She reports that she does not drink alcohol or use illicit drugs.   Review of Systems: Pertinent positive and negative review of systems were noted in the above HPI section. All other review of systems were otherwise negative.  Vital signs were reviewed in today's medical record Physical  Exam: General: Well developed , well nourished, no acute distress Skin: anicteric Head: Normocephalic and atraumatic Eyes:  sclerae anicteric, EOMI Ears: Normal auditory acuity Mouth: No deformity or lesions Neck: Supple, no masses or thyromegaly Lymph Nodes: no lymphadenopathy Lungs: Clear throughout to auscultation Heart: Regular rate and rhythm; no murmurs, rubs or bruits Gastroinestinal: Soft, non tender and non distended. No masses, hepatosplenomegaly or hernias noted. Normal Bowel sounds Rectal:deferred Musculoskeletal: Symmetrical with no gross deformities  Skin: No lesions on visible extremities Pulses:  Normal pulses noted Extremities: No clubbing, cyanosis, edema or deformities noted Neurological: Alert oriented x 4, grossly nonfocal Cervical Nodes:  No significant cervical adenopathy Inguinal Nodes: No significant inguinal adenopathy Psychological:  Alert and cooperative. Normal mood and affect  See Assessment and Plan under Problem List

## 2014-12-29 NOTE — Patient Instructions (Signed)

## 2014-12-30 ENCOUNTER — Encounter: Payer: Self-pay | Admitting: Family Medicine

## 2015-01-12 ENCOUNTER — Ambulatory Visit
Admission: RE | Admit: 2015-01-12 | Discharge: 2015-01-12 | Disposition: A | Discharge status: Home / Self Care | Payer: Medicare Other | Source: Ambulatory Visit | Attending: Family Medicine | Admitting: Family Medicine

## 2015-01-12 DIAGNOSIS — R413 Other amnesia: Secondary | ICD-10-CM

## 2015-01-12 MED ORDER — GADOBENATE DIMEGLUMINE 529 MG/ML IV SOLN
16.0000 mL | Freq: Once | INTRAVENOUS | Status: AC | PRN
  Administered 2015-01-12: 16 mL via INTRAVENOUS

## 2015-01-14 ENCOUNTER — Encounter: Payer: Self-pay | Admitting: Family Medicine

## 2015-01-20 ENCOUNTER — Ambulatory Visit (INDEPENDENT_AMBULATORY_CARE_PROVIDER_SITE_OTHER): Payer: Medicare Other | Admitting: Family Medicine

## 2015-01-20 ENCOUNTER — Encounter: Payer: Self-pay | Admitting: Family Medicine

## 2015-01-20 VITALS — BP 94/60 | HR 98 | Temp 98.2°F | Resp 18 | Ht 59.0 in | Wt 177.0 lb

## 2015-01-20 DIAGNOSIS — E1165 Type 2 diabetes mellitus with hyperglycemia: Secondary | ICD-10-CM | POA: Diagnosis not present

## 2015-01-20 DIAGNOSIS — R413 Other amnesia: Secondary | ICD-10-CM

## 2015-01-20 DIAGNOSIS — IMO0002 Reserved for concepts with insufficient information to code with codable children: Secondary | ICD-10-CM

## 2015-01-20 LAB — HEMOGLOBIN A1C
HEMOGLOBIN A1C: 7.4 % — AB (ref <5.7)
MEAN PLASMA GLUCOSE: 166 mg/dL — AB (ref <117)
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
## 2015-01-24 ENCOUNTER — Encounter: Payer: Self-pay | Admitting: Family Medicine

## 2015-01-24 ENCOUNTER — Other Ambulatory Visit: Payer: Self-pay | Admitting: Family Medicine

## 2015-01-24 MED ORDER — GLIPIZIDE 10 MG PO TABS: 10.0000 mg | ORAL_TABLET | Freq: Two times a day (BID) | ORAL | Status: DC

## 2015-01-24 MED ORDER — PRAVASTATIN SODIUM 40 MG PO TABS: 40.0000 mg | ORAL_TABLET | Freq: Every day | ORAL | Status: DC

## 2015-01-24 NOTE — Telephone Encounter (Signed)
Medication refilled per protocol. 

## 2015-01-25 ENCOUNTER — Other Ambulatory Visit: Payer: Self-pay | Admitting: Family Medicine

## 2015-01-25 MED ORDER — GLIPIZIDE ER 2.5 MG PO TB24: 2.5000 mg | ORAL_TABLET | Freq: Every day | ORAL | Status: DC

## 2015-01-26 ENCOUNTER — Other Ambulatory Visit: Payer: Self-pay | Admitting: Orthopaedic Surgery

## 2015-01-26 DIAGNOSIS — M542 Cervicalgia: Secondary | ICD-10-CM

## 2015-01-31 ENCOUNTER — Other Ambulatory Visit: Payer: Medicare Other

## 2015-02-10 NOTE — Progress Notes (Signed)
Mammogram showing up in Overdue Results.  Call pt and see if we can schedule her for mammogram

## 2015-02-21 ENCOUNTER — Encounter: Payer: Self-pay | Admitting: Gastroenterology

## 2015-02-21 ENCOUNTER — Ambulatory Visit (AMBULATORY_SURGERY_CENTER): Payer: Medicare Other | Admitting: Gastroenterology

## 2015-02-21 VITALS — BP 111/56 | HR 78 | Temp 96.5°F | Resp 18 | Ht 59.0 in | Wt 172.0 lb

## 2015-02-21 DIAGNOSIS — R1314 Dysphagia, pharyngoesophageal phase: Secondary | ICD-10-CM

## 2015-02-21 DIAGNOSIS — R1319 Other dysphagia: Secondary | ICD-10-CM

## 2015-02-21 DIAGNOSIS — K222 Esophageal obstruction: Secondary | ICD-10-CM | POA: Diagnosis not present

## 2015-02-21 DIAGNOSIS — R1310 Dysphagia, unspecified: Secondary | ICD-10-CM

## 2015-02-21 LAB — GLUCOSE, CAPILLARY
Glucose-Capillary: 86 mg/dL (ref 65–99)
Glucose-Capillary: 91 mg/dL (ref 65–99)

## 2015-02-21 MED ORDER — SODIUM CHLORIDE 0.9 % IV SOLN: 500.0000 mL | INTRAVENOUS | Status: DC

## 2015-02-21 MED ORDER — DEXTROSE 5 % IV SOLN: INTRAVENOUS | Status: DC

## 2015-02-21 NOTE — Progress Notes (Signed)
To recovery, report given, VSS 

## 2015-02-21 NOTE — Op Note (Signed)
[facility] [address] [place], [address]   ENDOSCOPY PROCEDURE REPORT  PATIENT: Marissa, Thompson  MR#: [id number] BIRTHDATE: 1950/09/26 , 63  yrs. old GENDER: female ENDOSCOPIST: Louis Meckel, MD REFERRED BY:  Lynnea Ferrier, M.D. PROCEDURE DATE:  02/21/2015 PROCEDURE:  EGD w/ balloon dilation ASA CLASS:     Class II INDICATIONS:  dysphagia. MEDICATIONS: Propofol 150 mg IV TOPICAL ANESTHETIC:  DESCRIPTION OF PROCEDURE: After the risks benefits and alternatives of the procedure were thoroughly explained, informed consent was obtained.  The LB [id number] endoscope was introduced through the mouth and advanced to the second portion of the duodenum , Without limitations.  The instrument was slowly withdrawn as the mucosa was fully examined.    ESOPHAGUS: There was a peptic stricture at the gastroesophageal junction.  The stricture was traversable.  Using a TTS-balloon the stricture was dilated up to 16mm.  The balloon was held inflated for 30 seconds. There was minimal resistance. Using a TTS-balloon the stricture was dilated up to 18mm.  The balloon was held inflated for 30 seconds.  Moderate resistance.  No heme.   Except for the findings listed, the EGD was otherwise normal.  Retroflexed views revealed no abnormalities.     The scope was then withdrawn from the patient and the procedure completed.  COMPLICATIONS: There were no immediate complications.  ENDOSCOPIC IMPRESSION: 1.   There was a stricture at the gastroesophageal junction; Using a TTS-balloon the stricture was dilated up to 16mm; The balloon was held inflated for 30 seconds; Using a TTS-balloon the stricture was dilated up to 18mm; The balloon was held inflated for 30 seconds 2.   EGD was otherwise normal  RECOMMENDATIONS: continue Protonix repeat dilatation as needed  REPEAT EXAM:  eSigned:  Louis Meckel, MD 02/21/2015 3:49 PM    CC:

## 2015-02-21 NOTE — Progress Notes (Signed)
Next colonoscopy determined by pathology results; 5 or 10 years.  Please review polyp, diverticulosis, and high fiber diet handouts provided.

## 2015-02-21 NOTE — Patient Instructions (Signed)
YOU HAD AN ENDOSCOPIC PROCEDURE TODAY AT THE [facility]:   Refer to the procedure report that was given to you for any specific questions about what was found during the examination.  If the procedure report does not answer your questions, please call your gastroenterologist to clarify.  If you requested that your care partner not be given the details of your procedure findings, then the procedure report has been included in a sealed envelope for you to review at your convenience later.  YOU SHOULD EXPECT: Some feelings of bloating in the abdomen. Passage of more gas than usual.  Walking can help get rid of the air that was put into your GI tract during the procedure and reduce the bloating. If you had a lower endoscopy (such as a colonoscopy or flexible sigmoidoscopy) you may notice spotting of blood in your stool or on the toilet paper. If you underwent a bowel prep for your procedure, you may not have a normal bowel movement for a few days.  Please Note:  You might notice some irritation and congestion in your nose or some drainage.  This is from the oxygen used during your procedure.  There is no need for concern and it should clear up in a day or so.  SYMPTOMS TO REPORT IMMEDIATELY:     Following upper endoscopy (EGD)  Vomiting of blood or coffee ground material  New chest pain or pain under the shoulder blades  Painful or persistently difficult swallowing  New shortness of breath  Fever of 100F or higher  Black, tarry-looking stools  For urgent or emergent issues, a gastroenterologist can be reached at any hour by calling [phone].   DIET:  Follow Dilation Handout.    ACTIVITY:  You should plan to take it easy for the rest of today and you should NOT DRIVE or use heavy machinery until tomorrow (because of the sedation medicines used during the test).    FOLLOW UP: Our staff will call the number listed on your records the next business day following your  procedure to check on you and address any questions or concerns that you may have regarding the information given to you following your procedure. If we do not reach you, we will leave a message.  However, if you are feeling well and you are not experiencing any problems, there is no need to return our call.  We will assume that you have returned to your regular daily activities without incident.  If any biopsies were taken you will be contacted by phone or by letter within the next 1-3 weeks.  Please call us at [phone] if you have not heard about the biopsies in 3 weeks.    SIGNATURES/CONFIDENTIALITY: You and/or your care partner have signed paperwork which will be entered into your electronic medical record.  These signatures attest to the fact that that the information above on your After Visit Summary has been reviewed and is understood.  Full responsibility of the confidentiality of this discharge information lies with you and/or your care-partner.   Resume medications. Information given on dilation diet. 

## 2015-02-22 ENCOUNTER — Telehealth: Payer: Self-pay | Admitting: *Deleted

## 2015-02-22 NOTE — Telephone Encounter (Signed)
No answer, message left for the patient. 

## 2015-02-23 ENCOUNTER — Ambulatory Visit
Admission: RE | Admit: 2015-02-23 | Discharge: 2015-02-23 | Disposition: A | Discharge status: Home / Self Care | Payer: Medicare Other | Source: Ambulatory Visit | Attending: Orthopaedic Surgery | Admitting: Orthopaedic Surgery

## 2015-02-23 ENCOUNTER — Other Ambulatory Visit: Payer: Self-pay | Admitting: Family Medicine

## 2015-02-23 DIAGNOSIS — M542 Cervicalgia: Secondary | ICD-10-CM

## 2015-02-23 MED ORDER — METFORMIN HCL 1000 MG PO TABS: 1000.0000 mg | ORAL_TABLET | Freq: Two times a day (BID) | ORAL | Status: DC

## 2015-02-23 NOTE — Telephone Encounter (Signed)
Medication refilled per protocol. 

## 2015-02-24 ENCOUNTER — Ambulatory Visit (INDEPENDENT_AMBULATORY_CARE_PROVIDER_SITE_OTHER): Payer: Medicare Other | Admitting: Family Medicine

## 2015-02-24 ENCOUNTER — Encounter: Payer: Self-pay | Admitting: Family Medicine

## 2015-02-24 VITALS — BP 124/72 | HR 80 | Temp 97.8°F | Resp 18 | Ht 59.0 in | Wt 185.0 lb

## 2015-02-24 DIAGNOSIS — E1165 Type 2 diabetes mellitus with hyperglycemia: Secondary | ICD-10-CM | POA: Diagnosis not present

## 2015-02-24 DIAGNOSIS — IMO0002 Reserved for concepts with insufficient information to code with codable children: Secondary | ICD-10-CM

## 2015-02-24 MED ORDER — SITAGLIPTIN PHOSPHATE 100 MG PO TABS: 100.0000 mg | ORAL_TABLET | Freq: Every day | ORAL | Status: DC

## 2015-02-24 MED ORDER — CANAGLIFLOZIN 300 MG PO TABS: 300.0000 mg | ORAL_TABLET | Freq: Every day | ORAL | Status: DC

## 2015-02-24 NOTE — Progress Notes (Signed)
Subjective:    Patient ID: Marissa Thompson, female    DOB: 1951/05/10, 64 y.o.   MRN: [id number]  HPI  she is here today for follow-up of her diabetes. When I last checked her hemoglobin A1c this summer it was 7.4. This is better than previously for this patient due to her noncompliance. However the patient wants to stop Actos and glipizide because of the weight gain it is causing her. She has gained 13 pounds since May. She is willing now to pain a higher co-pay for medication that will not cause weight gain. Past Medical History  Diagnosis Date  . PONV (postoperative nausea and vomiting)   . Allergy   . Anxiety 2003  . Depression 1998  . Diabetes mellitus without complication 1998  . GERD (gastroesophageal reflux disease) 1980  . Hyperlipidemia 1990  . Hypothyroidism 1998  . HLD (hyperlipidemia)    Past Surgical History  Procedure Laterality Date  . Tonsillectomy    . Ectopic pregnancy surgery    . Back surgery      x3  . Esophageal dilation      x3  . Hysteroscopy w/d&c N/A 08/17/2013    Procedure: DILATATION AND CURETTAGE /HYSTEROSCOPY;  Surgeon: Mickel Baas, MD;  Location: [facility];  Service: Gynecology;  Laterality: N/A;  YAG LASER  . Cervical polypectomy N/A 08/17/2013    Procedure: CERVICAL POLYPECTOMY;  Surgeon: Mickel Baas, MD;  Location: [facility];  Service: Gynecology;  Laterality: N/A;  . Neck surgery    . Colon surgery  2007    benign mass  . Spine surgery     Current Outpatient Prescriptions on File Prior to Visit  Medication Sig Dispense Refill  . aspirin EC 81 MG tablet Take 81 mg by mouth daily.    . Cholecalciferol (VITAMIN D PO) Take 4,000 Int'l Units by mouth daily.    . cyclobenzaprine (FLEXERIL) 10 MG tablet Take 10 mg by mouth 3 (three) times daily.    Marland Kitchen gabapentin (NEURONTIN) 600 MG tablet Take 1 tablet by mouth 3 (three) times daily.  0  . glipiZIDE (GLUCOTROL XL) 2.5 MG 24 hr tablet Take 1 tablet (2.5 mg total) by mouth daily with breakfast. 90  tablet 1  . glucose blood test strip Precision Extra Meter - check BS bid - DX: e11.9 100 each 5  . levothyroxine (SYNTHROID, LEVOTHROID) 175 MCG tablet Take 1 tablet (175 mcg total) by mouth daily before breakfast. 30 tablet 5  . metFORMIN (GLUCOPHAGE) 1000 MG tablet Take 1 tablet (1,000 mg total) by mouth 2 (two) times daily with a meal. 60 tablet 5  . pantoprazole (PROTONIX) 40 MG tablet Take 1 tablet (40 mg total) by mouth daily. 90 tablet 3  . pravastatin (PRAVACHOL) 40 MG tablet Take 1 tablet (40 mg total) by mouth daily with supper. 90 tablet 1  . sertraline (ZOLOFT) 100 MG tablet take 1 tablet by mouth twice a day 60 tablet 5   No current facility-administered medications on file prior to visit.   Allergies  Allergen Reactions  . Baclofen Other (See Comments)    Memory Loss and shakes  . Morphine And Related Itching  . Talwin [Pentazocine] Itching  . Toradol [Ketorolac Tromethamine] Other (See Comments)    Slurred speech, confusion  . Penicillins Rash   Social History   Social History  . Marital Status: Legally Separated    Spouse Name: N/A  . Number of Children: N/A  . Years of Education: N/A  Occupational History  . Not on file.   Social History Main Topics  . Smoking status: Never Smoker   . Smokeless tobacco: Never Used  . Alcohol Use: No  . Drug Use: No  . Sexual Activity: Not Currently   Other Topics Concern  . Not on file   Social History Narrative   Separated. Son lives with her.    On Disability--secondary to back. On disability since 2012.   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    Never smoked.       Review of Systems  All other systems reviewed and are negative.      Objective:   Physical Exam  Cardiovascular: Normal rate, regular rhythm and normal heart sounds.   No murmur heard. Pulmonary/Chest: Effort normal and breath sounds normal.  No respiratory distress. She has no wheezes. She has no rales. She exhibits no tenderness.  Abdominal: Soft. Bowel sounds are normal. She exhibits no distension. There is no tenderness. There is no rebound and no guarding.  Musculoskeletal: She exhibits no edema.  Vitals reviewed.         Assessment & Plan:  Diabetes mellitus type II, uncontrolled - Plan: sitaGLIPtin (JANUVIA) 100 MG tablet, canagliflozin (INVOKANA) 300 MG TABS tablet   Continue metformin. Discontinue glipizide. Discontinue Actos. Begin Januvia 100 mg by mouth daily. Again in the, 300 mg by mouth daily. Recheck lab ork in 3 months.

## 2015-03-04 ENCOUNTER — Ambulatory Visit (INDEPENDENT_AMBULATORY_CARE_PROVIDER_SITE_OTHER): Payer: Medicare Other | Admitting: Family Medicine

## 2015-03-04 ENCOUNTER — Encounter: Payer: Self-pay | Admitting: Family Medicine

## 2015-03-04 VITALS — BP 110/60 | HR 80 | Temp 98.3°F | Resp 18 | Ht 59.0 in | Wt 179.0 lb

## 2015-03-04 DIAGNOSIS — H6981 Other specified disorders of Eustachian tube, right ear: Secondary | ICD-10-CM | POA: Diagnosis not present

## 2015-03-04 MED ORDER — CETIRIZINE HCL 10 MG PO TABS: 10.0000 mg | ORAL_TABLET | Freq: Every day | ORAL | Status: DC

## 2015-03-04 MED ORDER — FLUTICASONE PROPIONATE 50 MCG/ACT NA SUSP: 2.0000 | Freq: Every day | NASAL | Status: DC

## 2015-03-04 NOTE — Progress Notes (Signed)
Subjective:    Patient ID: Marissa Thompson, female    DOB: [birth year], 64 y.o.   MRN: [id number]  HPI Patient reports one week of pain and discomfort in her right ear. On examination today, the right tympanic membrane is pearly gray. There is no erythema. However it is bulging and there is a middle ear effusion. Patient also reports a popping sensation in her ear whenever she chews or yawns or presses behind  her external ear. Past Medical History  Diagnosis Date  . PONV (postoperative nausea and vomiting)   . Allergy   . Anxiety 2003  . Depression 1998  . Diabetes mellitus without complication 1998  . GERD (gastroesophageal reflux disease) 1980  . Hyperlipidemia 1990  . Hypothyroidism 1998  . HLD (hyperlipidemia)    Past Surgical History  Procedure Laterality Date  . Tonsillectomy    . Ectopic pregnancy surgery    . Back surgery      x3  . Esophageal dilation      x3  . Hysteroscopy w/d&c N/A 08/17/2013    Procedure: DILATATION AND CURETTAGE /HYSTEROSCOPY;  Surgeon: Mickel Baas, MD;  Location: [facility];  Service: Gynecology;  Laterality: N/A;  YAG LASER  . Cervical polypectomy N/A 08/17/2013    Procedure: CERVICAL POLYPECTOMY;  Surgeon: Mickel Baas, MD;  Location: [facility];  Service: Gynecology;  Laterality: N/A;  . Neck surgery    . Colon surgery  2007    benign mass  . Spine surgery     Current Outpatient Prescriptions on File Prior to Visit  Medication Sig Dispense Refill  . aspirin EC 81 MG tablet Take 81 mg by mouth daily.    . canagliflozin (INVOKANA) 300 MG TABS tablet Take 300 mg by mouth daily before breakfast. 30 tablet 5  . Cholecalciferol (VITAMIN D PO) Take 4,000 Int'l Units by mouth daily.    . cyclobenzaprine (FLEXERIL) 10 MG tablet Take 10 mg by mouth 3 (three) times daily.    Marland Kitchen gabapentin (NEURONTIN) 600 MG tablet Take 1 tablet by mouth 3 (three) times daily.  0  . glucose blood test strip Precision Extra Meter - check BS bid - DX: e11.9 100 each 5  .  levothyroxine (SYNTHROID, LEVOTHROID) 175 MCG tablet Take 1 tablet (175 mcg total) by mouth daily before breakfast. 30 tablet 5  . metFORMIN (GLUCOPHAGE) 1000 MG tablet Take 1 tablet (1,000 mg total) by mouth 2 (two) times daily with a meal. 60 tablet 5  . pantoprazole (PROTONIX) 40 MG tablet Take 1 tablet (40 mg total) by mouth daily. 90 tablet 3  . pravastatin (PRAVACHOL) 40 MG tablet Take 1 tablet (40 mg total) by mouth daily with supper. 90 tablet 1  . sertraline (ZOLOFT) 100 MG tablet take 1 tablet by mouth twice a day 60 tablet 5  . sitaGLIPtin (JANUVIA) 100 MG tablet Take 1 tablet (100 mg total) by mouth daily. 30 tablet 5   No current facility-administered medications on file prior to visit.   Allergies  Allergen Reactions  . Baclofen Other (See Comments)    Memory Loss and shakes  . Morphine And Related Itching  . Talwin [Pentazocine] Itching  . Toradol [Ketorolac Tromethamine] Other (See Comments)    Slurred speech, confusion  . Penicillins Rash   Social History   Social History  . Marital Status: Legally Separated    Spouse Name: N/A  . Number of Children: N/A  . Years of Education: N/A   Occupational History  .  Not on file.   Social History Main Topics  . Smoking status: Never Smoker   . Smokeless tobacco: Never Used  . Alcohol Use: No  . Drug Use: No  . Sexual Activity: Not Currently   Other Topics Concern  . Not on file   Social History Narrative   Separated. Son lives with her.    On Disability--secondary to back. On disability since 2012.   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    Never smoked.       Review of Systems  All other systems reviewed and are negative.      Objective:   Physical Exam  Constitutional: She appears well-developed and well-nourished.  HENT:  Right Ear: External ear normal. Tympanic membrane is bulging. A middle ear  effusion is present.  Left Ear: Tympanic membrane, external ear and ear canal normal.  Nose: Nose normal.  Mouth/Throat: Oropharynx is clear and moist. No oropharyngeal exudate.  Neck: Neck supple.  Cardiovascular: Normal rate, regular rhythm and normal heart sounds.   Pulmonary/Chest: Effort normal and breath sounds normal. No respiratory distress. She has no wheezes. She has no rales.  Lymphadenopathy:    She has cervical adenopathy.  Vitals reviewed.         Assessment & Plan:  Eustachian tube dysfunction, right - Plan: fluticasone (FLONASE) 50 MCG/ACT nasal spray, cetirizine (ZYRTEC) 10 MG tablet  I believe the patient has eustachian tube dysfunction. Begin Flonase 2 sprays each nostril daily, Zyrtec 10 mg by mouth daily. Also recommended doing exercises to help open her eustachian tube area and recheck in 1-2 weeks if no better

## 2015-03-08 ENCOUNTER — Other Ambulatory Visit: Payer: Self-pay | Admitting: Family Medicine

## 2015-03-08 ENCOUNTER — Ambulatory Visit (INDEPENDENT_AMBULATORY_CARE_PROVIDER_SITE_OTHER): Payer: Medicare Other | Admitting: Neurology

## 2015-03-08 ENCOUNTER — Encounter: Payer: Self-pay | Admitting: Neurology

## 2015-03-08 VITALS — BP 126/80 | HR 81 | Resp 16 | Ht 59.0 in | Wt 182.0 lb

## 2015-03-08 DIAGNOSIS — E785 Hyperlipidemia, unspecified: Secondary | ICD-10-CM

## 2015-03-08 DIAGNOSIS — E1165 Type 2 diabetes mellitus with hyperglycemia: Secondary | ICD-10-CM

## 2015-03-08 DIAGNOSIS — R413 Other amnesia: Secondary | ICD-10-CM | POA: Diagnosis not present

## 2015-03-08 DIAGNOSIS — F411 Generalized anxiety disorder: Secondary | ICD-10-CM

## 2015-03-08 DIAGNOSIS — IMO0002 Reserved for concepts with insufficient information to code with codable children: Secondary | ICD-10-CM

## 2015-03-08 MED ORDER — LEVOTHYROXINE SODIUM 175 MCG PO TABS: 175.0000 ug | ORAL_TABLET | Freq: Every day | ORAL | Status: DC
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
## 2015-03-08 NOTE — Patient Instructions (Signed)
1. Call our office if you would like a referral for treatment of anxiety 2. Physical exercise and brain stimulation exercises (crossword puzzles, word search, Sudoku, etc) are important for brain health 3. Continue control of diabetes, cholesterol. Discuss starting daily baby aspirin with your PCP 4. Follow-up in 6 months

## 2015-03-08 NOTE — Telephone Encounter (Signed)
Medication refilled per protocol. 

## 2015-03-09 ENCOUNTER — Ambulatory Visit (INDEPENDENT_AMBULATORY_CARE_PROVIDER_SITE_OTHER): Payer: Medicare Other | Admitting: *Deleted

## 2015-03-09 ENCOUNTER — Other Ambulatory Visit: Payer: Medicare Other

## 2015-03-09 DIAGNOSIS — E1165 Type 2 diabetes mellitus with hyperglycemia: Secondary | ICD-10-CM | POA: Insufficient documentation

## 2015-03-09 DIAGNOSIS — Z111 Encounter for screening for respiratory tuberculosis: Secondary | ICD-10-CM | POA: Diagnosis not present

## 2015-03-09 DIAGNOSIS — E119 Type 2 diabetes mellitus without complications: Secondary | ICD-10-CM | POA: Insufficient documentation

## 2015-03-10 ENCOUNTER — Encounter: Payer: Self-pay | Admitting: Family Medicine

## 2015-03-10 LAB — MICROALBUMIN, URINE: Microalb, Ur: <0.2 mg/dL (ref <2.0)

## 2015-03-15 ENCOUNTER — Ambulatory Visit: Payer: Medicare Other

## 2015-03-31 ENCOUNTER — Encounter: Payer: Self-pay | Admitting: Family Medicine

## 2015-05-03 ENCOUNTER — Encounter: Payer: Self-pay | Admitting: Family Medicine

## 2015-05-12 ENCOUNTER — Ambulatory Visit (INDEPENDENT_AMBULATORY_CARE_PROVIDER_SITE_OTHER): Payer: Medicare Other | Admitting: Family Medicine

## 2015-05-12 ENCOUNTER — Encounter: Payer: Self-pay | Admitting: Family Medicine

## 2015-05-12 VITALS — BP 122/70 | HR 100 | Temp 98.1°F | Resp 18 | Ht 59.0 in | Wt 177.0 lb

## 2015-05-12 DIAGNOSIS — E1100 Type 2 diabetes mellitus with hyperosmolarity without nonketotic hyperglycemic-hyperosmolar coma (NKHHC): Secondary | ICD-10-CM

## 2015-05-12 DIAGNOSIS — R300 Dysuria: Secondary | ICD-10-CM | POA: Diagnosis not present

## 2015-05-12 NOTE — Progress Notes (Signed)
Subjective:    Patient ID: Marissa Thompson, female    DOB: 01/11/1951, 64 y.o.   MRN: [id number]  HPI  please see previous office visit in August. Patient discontinued invokana. She is currently on metformin and Januvia alone. Fasting blood sugars are approximately 200. Two-hour postprandial sugars are between 304 100. She has lost 4 pounds. She continues to have polyuria and blurry vision. She reports dehydration. She is losing weight.  Past Medical History  Diagnosis Date  . PONV (postoperative nausea and vomiting)   . Allergy   . Anxiety 2003  . Depression 1998  . Diabetes mellitus without complication (HCC) 1998  . GERD (gastroesophageal reflux disease) 1980  . Hyperlipidemia 1990  . Hypothyroidism 1998  . HLD (hyperlipidemia)    Past Surgical History  Procedure Laterality Date  . Tonsillectomy    . Ectopic pregnancy surgery    . Back surgery      x3 Lumbar  . Esophageal dilation      x3  . Hysteroscopy w/d&c N/A 08/17/2013    Procedure: DILATATION AND CURETTAGE /HYSTEROSCOPY;  Surgeon: Mickel Baasichard D Kaplan, MD;  Location: [facility];  Service: Gynecology;  Laterality: N/A;  YAG LASER  . Cervical polypectomy N/A 08/17/2013    Procedure: CERVICAL POLYPECTOMY;  Surgeon: Mickel Baasichard D Kaplan, MD;  Location: [facility];  Service: Gynecology;  Laterality: N/A;  . Neck surgery      x 1   . Colon surgery  2007    benign mass   Current Outpatient Prescriptions on File Prior to Visit  Medication Sig Dispense Refill  . aspirin EC 81 MG tablet Take 81 mg by mouth daily.    . Cholecalciferol (VITAMIN D PO) Take 4,000 Int'l Units by mouth daily.    . cyclobenzaprine (FLEXERIL) 10 MG tablet Take 10 mg by mouth 3 (three) times daily.    Marland Kitchen. gabapentin (NEURONTIN) 600 MG tablet Take 1 tablet in the am, 2 tablets at noon, 1 tablet at 5, 2 tablets at bedtime  0  . ibuprofen (ADVIL,MOTRIN) 800 MG tablet Take 800 mg by mouth 4 (four) times daily as needed.    Marland Kitchen. levothyroxine (SYNTHROID, LEVOTHROID) 175 MCG  tablet Take 1 tablet (175 mcg total) by mouth daily before breakfast. 90 tablet 1  . metFORMIN (GLUCOPHAGE) 1000 MG tablet Take 1 tablet (1,000 mg total) by mouth 2 (two) times daily with a meal. 60 tablet 5  . OVER THE COUNTER MEDICATION [place] Patch Apply 1 Patch Daily As Needed For Pain    . pantoprazole (PROTONIX) 40 MG tablet Take 1 tablet (40 mg total) by mouth daily. 90 tablet 3  . pravastatin (PRAVACHOL) 40 MG tablet Take 1 tablet (40 mg total) by mouth daily with supper. 90 tablet 1  . sertraline (ZOLOFT) 100 MG tablet take 1 tablet by mouth twice a day 60 tablet 5  . sitaGLIPtin (JANUVIA) 100 MG tablet Take 1 tablet (100 mg total) by mouth daily. 30 tablet 5  . canagliflozin (INVOKANA) 300 MG TABS tablet Take 300 mg by mouth daily before breakfast. (Patient not taking: Reported on 05/12/2015) 30 tablet 5   No current facility-administered medications on file prior to visit.   Allergies  Allergen Reactions  . Baclofen Other (See Comments)    Memory Loss and shakes  . Morphine And Related Itching  . Talwin [Pentazocine] Itching  . Toradol [Ketorolac Tromethamine] Other (See Comments)    Slurred speech, confusion  . Penicillins Rash   Social History  Social History  . Marital Status: Legally Separated    Spouse Name: N/A  . Number of Children: 1  . Years of Education: N/A   Occupational History  . Nurse    Social History Main Topics  . Smoking status: Never Smoker   . Smokeless tobacco: Never Used  . Alcohol Use: No  . Drug Use: No  . Sexual Activity: Not Currently   Other Topics Concern  . Not on file   Social History Narrative   Separated. Son lives with her.    On Disability--secondary to back. On disability since 2012.   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    Never smoked.       Review of Systems  All other systems reviewed and are negative.       Objective:   Physical Exam  Constitutional: She appears well-developed and well-nourished.  HENT:  Right Ear: External ear normal.  Left Ear: Tympanic membrane, external ear and ear canal normal.  Nose: Nose normal.  Mouth/Throat: Oropharynx is clear and moist. No oropharyngeal exudate.  Neck: Neck supple.  Cardiovascular: Normal rate, regular rhythm and normal heart sounds.   Pulmonary/Chest: Effort normal and breath sounds normal. No respiratory distress. She has no wheezes. She has no rales.  Vitals reviewed.         Assessment & Plan:  Dysuria - Plan: Urinalysis, Routine w reflex microscopic (not at [facility])  Uncontrolled type 2 diabetes mellitus with hyperosmolarity without coma, unspecified long term insulin use status (HCC)  Discontinue Januvia. Continue metformin. Patient will begin Lantus 15 units subcutaneous today. Increase by 1 unit daily until fasting blood sugars fall below 130. Recheck in 2 weeks

## 2015-05-18 ENCOUNTER — Ambulatory Visit: Payer: Medicare Other | Admitting: Physician Assistant

## 2015-05-19 ENCOUNTER — Encounter: Payer: Self-pay | Admitting: Family Medicine

## 2015-05-19 ENCOUNTER — Ambulatory Visit (INDEPENDENT_AMBULATORY_CARE_PROVIDER_SITE_OTHER): Payer: Medicare Other | Admitting: Family Medicine

## 2015-05-19 VITALS — BP 128/72 | HR 64 | Temp 98.5°F | Resp 16 | Ht 59.0 in | Wt 179.0 lb

## 2015-05-19 DIAGNOSIS — W57XXXA Bitten or stung by nonvenomous insect and other nonvenomous arthropods, initial encounter: Secondary | ICD-10-CM | POA: Diagnosis not present

## 2015-05-19 DIAGNOSIS — T148 Other injury of unspecified body region: Secondary | ICD-10-CM

## 2015-05-19 MED ORDER — DOXYCYCLINE HYCLATE 100 MG PO TABS: 100.0000 mg | ORAL_TABLET | Freq: Two times a day (BID) | ORAL | Status: DC

## 2015-05-19 NOTE — Progress Notes (Signed)
Subjective:    Patient ID: Marissa Thompson, female    DOB: [birth year], 64 y.o.   MRN: [id number]  HPI 3 days ago patient found a tick on the posterior left neck. There is now a large red welts there in that area that is 1 cm in diameter. There is no spreading red ring. She denies any fevers or chills or headache or myalgias. Past Medical History  Diagnosis Date  . PONV (postoperative nausea and vomiting)   . Allergy   . Anxiety 2003  . Depression 1998  . Diabetes mellitus without complication (HCC) 1998  . GERD (gastroesophageal reflux disease) 1980  . Hyperlipidemia 1990  . Hypothyroidism 1998  . HLD (hyperlipidemia)    Past Surgical History  Procedure Laterality Date  . Tonsillectomy    . Ectopic pregnancy surgery    . Back surgery      x3 Lumbar  . Esophageal dilation      x3  . Hysteroscopy w/d&c N/A 08/17/2013    Procedure: DILATATION AND CURETTAGE /HYSTEROSCOPY;  Surgeon: Mickel Baas, MD;  Location: [facility];  Service: Gynecology;  Laterality: N/A;  YAG LASER  . Cervical polypectomy N/A 08/17/2013    Procedure: CERVICAL POLYPECTOMY;  Surgeon: Mickel Baas, MD;  Location: [facility];  Service: Gynecology;  Laterality: N/A;  . Neck surgery      x 1   . Colon surgery  2007    benign mass   Current Outpatient Prescriptions on File Prior to Visit  Medication Sig Dispense Refill  . aspirin EC 81 MG tablet Take 81 mg by mouth daily.    . Cholecalciferol (VITAMIN D PO) Take 4,000 Int'l Units by mouth daily.    . cyclobenzaprine (FLEXERIL) 10 MG tablet Take 10 mg by mouth 3 (three) times daily.    Marland Kitchen gabapentin (NEURONTIN) 600 MG tablet Take 1 tablet in the am, 2 tablets at noon, 1 tablet at 5, 2 tablets at bedtime  0  . ibuprofen (ADVIL,MOTRIN) 800 MG tablet Take 800 mg by mouth 4 (four) times daily as needed.    Marland Kitchen levothyroxine (SYNTHROID, LEVOTHROID) 175 MCG tablet Take 1 tablet (175 mcg total) by mouth daily before breakfast. 90 tablet 1  . metFORMIN (GLUCOPHAGE) 1000 MG  tablet Take 1 tablet (1,000 mg total) by mouth 2 (two) times daily with a meal. 60 tablet 5  . OVER THE COUNTER MEDICATION [place] Patch Apply 1 Patch Daily As Needed For Pain    . pantoprazole (PROTONIX) 40 MG tablet Take 1 tablet (40 mg total) by mouth daily. 90 tablet 3  . pravastatin (PRAVACHOL) 40 MG tablet Take 1 tablet (40 mg total) by mouth daily with supper. 90 tablet 1  . sertraline (ZOLOFT) 100 MG tablet take 1 tablet by mouth twice a day 60 tablet 5   No current facility-administered medications on file prior to visit.   Allergies  Allergen Reactions  . Baclofen Other (See Comments)    Memory Loss and shakes  . Morphine And Related Itching  . Talwin [Pentazocine] Itching  . Toradol [Ketorolac Tromethamine] Other (See Comments)    Slurred speech, confusion  . Penicillins Rash   Social History   Social History  . Marital Status: Legally Separated    Spouse Name: N/A  . Number of Children: 1  . Years of Education: N/A   Occupational History  . Nurse    Social History Main Topics  . Smoking status: Never Smoker   . Smokeless tobacco: Never  Used  . Alcohol Use: No  . Drug Use: No  . Sexual Activity: Not Currently   Other Topics Concern  . Not on file   Social History Narrative   Separated. Son lives with her.    On Disability--secondary to back. On disability since 2012.   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    Never smoked.       Review of Systems  All other systems reviewed and are negative.      Objective:   Physical Exam  Cardiovascular: Normal rate, regular rhythm and normal heart sounds.   Pulmonary/Chest: Effort normal and breath sounds normal. No respiratory distress. She has no wheezes. She has no rales.  Skin: No rash noted. There is erythema.  Vitals reviewed.         Assessment & Plan:  Tick bite - Plan: doxycycline (VIBRA-TABS)  100 MG tablet  I recommended clinical monitoring at the present time. If she develops a spreading red ring or she develops flulike symptoms, I would like her to start doxycycline 100 mg by mouth twice a day and recheck with me immediately.  Increase insulin to 30 units a day and recheck blood sugar in one week as two-hour postprandial sugars are still greater than 200

## 2015-05-24 ENCOUNTER — Telehealth: Payer: Self-pay | Admitting: Gastroenterology

## 2015-05-24 NOTE — Telephone Encounter (Signed)
Spoke with the patient. She is on Protonix 40 mg daily. She has a sensation of food sitting in her stomach. She also has no urges for a bowel movements. She doesn't think it is constipation. She reports she has taken "Colon Cleanse" to get her bowels to move. Appointment made with APP for evaluation.

## 2015-05-26 ENCOUNTER — Ambulatory Visit: Payer: Medicare Other | Admitting: Family Medicine

## 2015-06-02 ENCOUNTER — Other Ambulatory Visit: Payer: Medicare Other

## 2015-06-07 ENCOUNTER — Other Ambulatory Visit: Payer: Medicare Other

## 2015-06-14 ENCOUNTER — Ambulatory Visit (INDEPENDENT_AMBULATORY_CARE_PROVIDER_SITE_OTHER): Payer: Medicare Other | Admitting: Physician Assistant

## 2015-06-14 ENCOUNTER — Encounter: Payer: Self-pay | Admitting: Physician Assistant

## 2015-06-14 VITALS — BP 118/70 | HR 88 | Ht 59.0 in | Wt 179.0 lb

## 2015-06-14 DIAGNOSIS — K589 Irritable bowel syndrome without diarrhea: Secondary | ICD-10-CM | POA: Diagnosis not present

## 2015-06-14 DIAGNOSIS — K219 Gastro-esophageal reflux disease without esophagitis: Secondary | ICD-10-CM | POA: Diagnosis not present

## 2015-06-14 DIAGNOSIS — K5900 Constipation, unspecified: Secondary | ICD-10-CM

## 2015-06-14 MED ORDER — METRONIDAZOLE 250 MG PO TABS: 250.0000 mg | ORAL_TABLET | Freq: Three times a day (TID) | ORAL | Status: DC

## 2015-06-14 MED ORDER — PANTOPRAZOLE SODIUM 40 MG PO TBEC: 40.0000 mg | DELAYED_RELEASE_TABLET | Freq: Every day | ORAL | Status: DC

## 2015-06-14 NOTE — Progress Notes (Signed)
Reviewed and agree with documentation and assessment and plan. K. Veena Shanard Treto , MD   

## 2015-06-14 NOTE — Progress Notes (Addendum)
Patient ID: Marissa Thompson, female   DOB: [birth year], 64 y.o.   MRN: [id number]     History of Present Illness: Marissa Thompson  Is a delightful 64 year old female who was initially evaluated by Dr. Arlyce Dice in June 2016. She has a history of esophageal stricture that had required dilation several times in the past. When she was seen in June, she was again having dysphagia. She underwent an EGD with balloon dilation on 02/21/2015. There was a stricture at the GE junction. Using a TTS balloon the stricture was dilated up to 16 mm , the balloon was held inflated for 30 seconds. Using a TTS balloon the stricture was dilated up to 18 mm, the balloon was held inflated for 30 seconds. The EGD was otherwise normal. [name] is here today stating that for the past 2 months she feels as if food and liquids get caught in the back of her throat she coughs and chokes and sputters throughout her meal and has a lot of phlegm in her throat by the end of her meal. She does not feel as if food is getting stuck in the chest again , rather she feels she is having a hard time getting it out of her throat.    She also states that she is constipated. She has been having a bowel movement every 3 or 4 days. She has to take a laxative every third or fourth night if she has not had a bowel movement. She tried an herbal colon cleanse with little relief. She has been using Senokot 1 or 2 tablets every third or fourth night. She has never tried fiber supplements. She tried [place] lax once or twice in the past but never used it on a regular basis. She also reports that she has been excessively gassy and bloated and passing a lot of foul-smelling flatulence. She has had no bright red blood per rectum or melena. She has no abdominal pain.  She has a history of "a colonic mass " that was resected in 2007 in [place]. She reports a colonoscopy in 2013  at [facility] gastroenterology that was normal.   Past Medical History  Diagnosis Date  . PONV  (postoperative nausea and vomiting)   . Allergy   . Anxiety 2003  . Depression 1998  . Diabetes mellitus without complication (HCC) 1998  . GERD (gastroesophageal reflux disease) 1980  . Hyperlipidemia 1990  . Hypothyroidism 1998  . HLD (hyperlipidemia)     Past Surgical History  Procedure Laterality Date  . Tonsillectomy    . Ectopic pregnancy surgery    . Back surgery      x3 Lumbar  . Esophageal dilation      x3  . Hysteroscopy w/d&c N/A 08/17/2013    Procedure: DILATATION AND CURETTAGE /HYSTEROSCOPY;  Surgeon: Mickel Baas, MD;  Location: [facility];  Service: Gynecology;  Laterality: N/A;  YAG LASER  . Cervical polypectomy N/A 08/17/2013    Procedure: CERVICAL POLYPECTOMY;  Surgeon: Mickel Baas, MD;  Location: [facility];  Service: Gynecology;  Laterality: N/A;  . Neck surgery      x 1   . Colon surgery  2007    benign mass   Family History  Problem Relation Age of Onset  . Depression Mother   . Diabetes Mother   . Hypertension Mother   . Alcohol abuse Father   . Arthritis Father   . Cancer Father 11    Oral Cancer- had tongue, jaw resection--smoker and alcohol  .  Alcohol abuse Brother    Social History  Substance Use Topics  . Smoking status: Never Smoker   . Smokeless tobacco: Never Used  . Alcohol Use: No   Current Outpatient Prescriptions  Medication Sig Dispense Refill  . aspirin EC 81 MG tablet Take 81 mg by mouth daily.    . Cholecalciferol (VITAMIN D PO) Take 4,000 Int'l Units by mouth daily.    . cyclobenzaprine (FLEXERIL) 10 MG tablet Take 10 mg by mouth 3 (three) times daily.    Marland Kitchen. gabapentin (NEURONTIN) 600 MG tablet Take 1 tablet in the am, 2 tablets at noon, 1 tablet at 5, 2 tablets at bedtime  0  . ibuprofen (ADVIL,MOTRIN) 800 MG tablet Take 800 mg by mouth 4 (four) times daily as needed.    . Insulin Glargine (TOUJEO SOLOSTAR) 300 UNIT/ML SOPN Inject 15-25 Units into the skin daily.    Marland Kitchen. levothyroxine (SYNTHROID, LEVOTHROID) 175 MCG tablet Take 1  tablet (175 mcg total) by mouth daily before breakfast. 90 tablet 1  . metFORMIN (GLUCOPHAGE) 1000 MG tablet Take 1 tablet (1,000 mg total) by mouth 2 (two) times daily with a meal. 60 tablet 5  . OVER THE COUNTER MEDICATION [place] Patch Apply 1 Patch Daily As Needed For Pain    . pantoprazole (PROTONIX) 40 MG tablet Take 1 tablet (40 mg total) by mouth daily. 90 tablet 3  . pravastatin (PRAVACHOL) 40 MG tablet Take 1 tablet (40 mg total) by mouth daily with supper. 90 tablet 1  . sertraline (ZOLOFT) 100 MG tablet take 1 tablet by mouth twice a day 60 tablet 5   No current facility-administered medications for this visit.   Allergies  Allergen Reactions  . Baclofen Other (See Comments)    Memory Loss and shakes  . Morphine And Related Itching  . Talwin [Pentazocine] Itching  . Toradol [Ketorolac Tromethamine] Other (See Comments)    Slurred speech, confusion  . Penicillins Rash     Review of Systems: Gen: Denies any fever, chills, sweats, anorexia, fatigue, weakness, malaise, weight loss, and sleep disorder CV: Denies chest pain, angina, palpitations, syncope, orthopnea, PND, peripheral edema, and claudication. Resp: Denies dyspnea at rest, dyspnea with exercise, cough, sputum, wheezing, coughing up blood, and pleurisy. GI: Denies vomiting blood, jaundice, and fecal incontinence.    Has dysphagia to solids and liquids GU : Denies urinary burning, blood in urine, urinary frequency, urinary hesitancy, nocturnal urination, and urinary incontinence. MS: Denies joint pain, limitation of movement, and swelling, stiffness, low back pain, extremity pain. Denies muscle weakness, cramps, atrophy.  Derm: Denies rash, itching, dry skin, hives, moles, warts, or unhealing ulcers.  Psych: Denies depression, anxiety, memory loss, suicidal ideation, hallucinations, paranoia, and confusion. Heme: Denies bruising, bleeding, and enlarged lymph nodes. Neuro:  Denies any headaches, dizziness,  paresthesia Endo:  Denies any problems with DM, thyroid, adrenal    Physical Exam: BP 118/70 mmHg  Pulse 88  Ht 4\' 11"  (1.499 m)  Wt 179 lb (81.194 kg)  BMI 36.13 kg/m2 General: Pleasant, well developed , Caucasianfemale in no acute distress Head: Normocephalic and atraumatic Eyes:  sclerae anicteric, conjunctiva pink  Ears: Normal auditory acuity Lungs: Clear throughout to auscultation Heart: Regular rate and rhythm Abdomen: Soft, non distended, non-tender. No masses, no hepatomegaly. Normal bowel sound Musculoskeletal: Symmetrical with no gross deformities  Extremities: No edema  Neurological: Alert oriented x 4, grossly nonfocal Psychological:  Alert and cooperative. Normal mood and affect  Assessment and Recommendations:  #1. Dysphagia. Patient  has a history of GERD and has had esophageal strictures requiring dilation in the past. Her most recent dilation was in August 2016. Today she describes several months of food and liquids pooling in the back of her throat causing her to cough and sputter. She will be scheduled for a modified barium swallow with speech pathology to evaluate for a possible transfer dysphagia.    #2. History of colon resection. Patient reports a segmental resection in 2007 in [place] for "a colon mass". She is unsure of the pathology. She has signed a medical release to obtain the colonoscopy report from [facility] gastroenterology regarding her colonoscopy via her in 2013.    #3. Constipation. She has been advised to add "P fruits" to her diet on a daily basis she will increase water intake. She will be given a trial of Mira lax one to 2 capfuls daily in water. She has been advised to titrate the dose up or down as needed daily to achieve the desired effect. She will also be given an empiric trial of Flagyl 250 mg 3 times a day for 10 days to see if this helps decrease her gas.    She will return in 1-1/2-2 months to establish care with Dr. Lavon Paganini, sooner if  needed.        Alfonse Garringer, Tollie Pizza PA-C 06/14/2015,   Addendum 08/10/2015: Received colonoscopy report from [facility] endoscopy center. Patient had a colonoscopy 10/14/2012. There was evidence of a prior end-to-side ileocolonic anastomosis at the hepatic flexure. This was patent. This was characterized by healthy-appearing mucosa. Otherwise normal.

## 2015-06-14 NOTE — Patient Instructions (Signed)
You have been scheduled for a modified barium swallow on 06-21-2015 at 11:30am . Please arrive 15 minutes prior to your test for registration. [facility] for your appointment. Please refrain from eating or drinking anything 4 hours prior to your test. Should you need to cancel or reschedule your appointment, please contact [phone] [facility]([place]). _____________________________________________________________________ A Modified Barium Swallow Study, or MBS, is a special x-ray that is taken to check swallowing skills. It is carried out by a Marine scientistadiologist and a Warehouse managerpeech Language Pathologist (SLP). During this test, yourmouth, throat, and esophagus, a muscular tube which connects your mouth to your stomach, is checked. The test will help you, your doctor, and the SLP plan what types of foods and liquids are easier for you to swallow. The SLP will also identify positions and ways to help you swallow more easily and safely. What will happen during an MBS? You will be taken to an x-ray room and seated comfortably. You will be asked to swallow small amounts of food and liquid mixed with barium. Barium is a liquid or paste that allows images of your mouth, throat and esophagus to be seen on x-ray. The x-ray captures moving images of the food you are swallowing as it travels from your mouth through your throat and into your esophagus. This test helps identify whether food or liquid is entering your lungs (aspiration). The test also shows which part of your mouth or throat lacks strength or coordination to move the food or liquid in the right direction. This test typically takes 30 minutes to 1 hour to complete. _______________________________________________________________________  Please cut food into small pieces before eating.  P fruits  Peaches Pears Prunes Plums Pineapple  Pears  Miralax 1-2 capfuls  Daily in water, titrate to desired effect.  We have sent the  following medications to your pharmacy for you to pick up at your convenience:Flagyl and Pantroprazole  Please follow up with Dr Lavon PaganiniNandigam on 08-17-2015 @ 10am

## 2015-06-15 ENCOUNTER — Other Ambulatory Visit (HOSPITAL_COMMUNITY): Payer: Self-pay | Admitting: Physician Assistant

## 2015-06-15 DIAGNOSIS — R1310 Dysphagia, unspecified: Secondary | ICD-10-CM

## 2015-06-16 ENCOUNTER — Encounter: Payer: Self-pay | Admitting: Family Medicine

## 2015-06-16 ENCOUNTER — Other Ambulatory Visit: Payer: Self-pay | Admitting: Family Medicine

## 2015-06-16 MED ORDER — INSULIN GLARGINE 300 UNIT/ML ~~LOC~~ SOPN: 30.0000 [IU] | PEN_INJECTOR | Freq: Every day | SUBCUTANEOUS | Status: DC

## 2015-06-16 MED ORDER — INSULIN PEN NEEDLE 32G X 4 MM MISC: Status: DC

## 2015-06-16 MED ORDER — INSULIN LISPRO 100 UNIT/ML (KWIKPEN): 7.0000 [IU] | PEN_INJECTOR | Freq: Every day | SUBCUTANEOUS | Status: DC

## 2015-06-21 ENCOUNTER — Ambulatory Visit (HOSPITAL_COMMUNITY): Payer: Medicare Other

## 2015-06-21 ENCOUNTER — Inpatient Hospital Stay (HOSPITAL_COMMUNITY): Admission: RE | Admit: 2015-06-21 | Payer: Medicare Other | Source: Ambulatory Visit

## 2015-06-23 ENCOUNTER — Other Ambulatory Visit (HOSPITAL_COMMUNITY): Payer: Self-pay | Admitting: Physician Assistant

## 2015-06-23 DIAGNOSIS — R1310 Dysphagia, unspecified: Secondary | ICD-10-CM

## 2015-07-12 ENCOUNTER — Ambulatory Visit (HOSPITAL_COMMUNITY)
Admission: RE | Admit: 2015-07-12 | Discharge: 2015-07-12 | Disposition: A | Discharge status: Home / Self Care | Payer: Medicare Other | Source: Ambulatory Visit | Attending: Physician Assistant | Admitting: Physician Assistant

## 2015-07-12 DIAGNOSIS — K589 Irritable bowel syndrome without diarrhea: Secondary | ICD-10-CM

## 2015-07-12 DIAGNOSIS — K5900 Constipation, unspecified: Secondary | ICD-10-CM

## 2015-07-12 DIAGNOSIS — R1310 Dysphagia, unspecified: Secondary | ICD-10-CM

## 2015-07-12 DIAGNOSIS — R1313 Dysphagia, pharyngeal phase: Secondary | ICD-10-CM | POA: Insufficient documentation

## 2015-07-12 DIAGNOSIS — K219 Gastro-esophageal reflux disease without esophagitis: Secondary | ICD-10-CM

## 2015-07-12 NOTE — Progress Notes (Signed)
Speech Pathology   MBSS complete. Full report located under chart review in imaging section.   Recommendations:   Mild sensorimotor pharyngeal phase dysphagia due to delayed sensation and decreased laryngeal elevation with decreased epiglottic inversion leading to laryngeal penetration (slightly prior to initiation) with thin barium. Transient episodes of flash penetration and trace amounts which remained on anterior wall of laryngeal vestibule. Reduced tongue base retraction led to mild vallecular stasis primarily with solids. Chic tuck was ineffective to mitigate penetration. A supraglottic swallow technique utilized to facilitate timelier larygneal closure was mildly effective. Esophagus briefly scanned not revealing abnormalitites (MBS does not diagnose below level of UES). Suspect ACDF surgery could possiblly be primary indicator of dysphagia in addition to known esophageal impairments. Recommend regular textures with pt using judgement with meats and vegetables (use gravy, moist meat) and thin liquids. Supraglottic breath hold prior to swallow with liquids recommended, pills whole in applesauce.     Breck CoonsLisa Willis MossyrockLitaker M.Ed [employer]-SLP Pager [phone]

## 2015-07-17 HISTORY — PX: OTHER SURGICAL HISTORY: SHX169

## 2015-07-20 ENCOUNTER — Encounter: Payer: Self-pay | Admitting: *Deleted

## 2015-07-29 ENCOUNTER — Telehealth: Payer: Self-pay | Admitting: Gastroenterology

## 2015-07-29 NOTE — Telephone Encounter (Signed)
Rec'd from [facility] forward 27 pages to Dr.Nandigam

## 2015-08-01 ENCOUNTER — Encounter: Payer: Self-pay | Admitting: Family Medicine

## 2015-08-01 ENCOUNTER — Other Ambulatory Visit: Payer: Self-pay | Admitting: Physician Assistant

## 2015-08-01 NOTE — Telephone Encounter (Signed)
Medication refill for one time only.  Patient needs to be seen.  Letter sent for patient to call and schedule 

## 2015-08-17 ENCOUNTER — Ambulatory Visit: Payer: Medicare Other | Admitting: Gastroenterology

## 2015-08-18 ENCOUNTER — Ambulatory Visit: Payer: Medicare Other | Admitting: Family Medicine

## 2015-08-19 ENCOUNTER — Encounter: Payer: Self-pay | Admitting: Physician Assistant

## 2015-08-23 ENCOUNTER — Encounter: Payer: Self-pay | Admitting: Family Medicine

## 2015-08-23 ENCOUNTER — Ambulatory Visit (INDEPENDENT_AMBULATORY_CARE_PROVIDER_SITE_OTHER): Payer: Medicare Other | Admitting: Family Medicine

## 2015-08-23 VITALS — BP 124/86 | HR 88 | Temp 98.1°F | Resp 18 | Wt 175.0 lb

## 2015-08-23 DIAGNOSIS — Z794 Long term (current) use of insulin: Secondary | ICD-10-CM | POA: Diagnosis not present

## 2015-08-23 DIAGNOSIS — F329 Major depressive disorder, single episode, unspecified: Secondary | ICD-10-CM

## 2015-08-23 DIAGNOSIS — E11649 Type 2 diabetes mellitus with hypoglycemia without coma: Secondary | ICD-10-CM

## 2015-08-23 DIAGNOSIS — F32A Depression, unspecified: Secondary | ICD-10-CM

## 2015-08-23 DIAGNOSIS — E038 Other specified hypothyroidism: Secondary | ICD-10-CM | POA: Diagnosis not present

## 2015-08-23 LAB — COMPLETE METABOLIC PANEL WITH GFR
ALBUMIN: 3.9 g/dL (ref 3.6–5.1)
ALT: 33 U/L — ABNORMAL HIGH (ref 6–29)
AST: 32 U/L (ref 10–35)
Alkaline Phosphatase: 144 U/L — ABNORMAL HIGH (ref 33–130)
BUN: 10 mg/dL (ref 7–25)
CALCIUM: 9 mg/dL (ref 8.6–10.4)
CHLORIDE: 99 mmol/L (ref 98–110)
CO2: 28 mmol/L (ref 20–31)
Creat: 0.61 mg/dL (ref 0.50–0.99)
GFR, Est African American: >89 mL/min (ref >=60)
Glucose, Bld: 93 mg/dL (ref 70–99)
POTASSIUM: 4 mmol/L (ref 3.5–5.3)
SODIUM: 139 mmol/L (ref 135–146)
Total Bilirubin: 0.4 mg/dL (ref 0.2–1.2)
Total Protein: 6.6 g/dL (ref 6.1–8.1)

## 2015-08-23 LAB — LIPID PANEL
CHOLESTEROL: 193 mg/dL (ref 125–200)
HDL: 114 mg/dL (ref >=46)
LDL CALC: 60 mg/dL (ref <130)
TRIGLYCERIDES: 96 mg/dL (ref <150)
Total CHOL/HDL Ratio: 1.7 Ratio (ref <=5.0)
VLDL: 19 mg/dL (ref <30)

## 2015-08-23 LAB — TSH: TSH: 0.02 mIU/L — ABNORMAL LOW

## 2015-08-23 LAB — CBC WITH DIFFERENTIAL/PLATELET
BASOS PCT: 0 % (ref 0–1)
Basophils Absolute: 0 10*3/uL (ref 0.0–0.1)
EOS PCT: 1 % (ref 0–5)
Eosinophils Absolute: 0.1 10*3/uL (ref 0.0–0.7)
HEMATOCRIT: 38.4 % (ref 36.0–46.0)
HEMOGLOBIN: 12.6 g/dL (ref 12.0–15.0)
Lymphocytes Relative: 26 % (ref 12–46)
Lymphs Abs: 1.5 10*3/uL (ref 0.7–4.0)
MCH: 25 pg — AB (ref 26.0–34.0)
MCHC: 32.8 g/dL (ref 30.0–36.0)
MCV: 76.3 fL — ABNORMAL LOW (ref 78.0–100.0)
MONO ABS: 0.4 10*3/uL (ref 0.1–1.0)
MONOS PCT: 7 % (ref 3–12)
MPV: 9 fL (ref 8.6–12.4)
NEUTROS ABS: 3.9 10*3/uL (ref 1.7–7.7)
Neutrophils Relative %: 66 % (ref 43–77)
Platelets: 270 10*3/uL (ref 150–400)
RBC: 5.03 MIL/uL (ref 3.87–5.11)
RDW: 18.5 % — AB (ref 11.5–15.5)
WBC: 5.9 10*3/uL (ref 4.0–10.5)

## 2015-08-23 MED ORDER — VENLAFAXINE HCL ER 75 MG PO CP24: 150.0000 mg | ORAL_CAPSULE | Freq: Every day | ORAL | Status: DC

## 2015-08-23 NOTE — Progress Notes (Signed)
Subjective:    Patient ID: Marissa Thompson, female    DOB: Sep 21, 1950, 65 y.o.   MRN: [id number]  HPI 02/24/15 She is here today for follow-up of her diabetes. When I last checked her hemoglobin A1c this summer it was 7.4. This is better than previously for this patient due to her noncompliance. However the patient wants to stop Actos and glipizide because of the weight gain it is causing her. She has gained 13 pounds since May. She is willing now to pain a higher co-pay for medication that will not cause weight gain.  At that time, my plan was:  Continue metformin. Discontinue glipizide. Discontinue Actos. Begin Januvia 100 mg by mouth daily. Again in the, 300 mg by mouth daily. Recheck lab ork in 3 months.  08/22/14 Ultimately, patient is now on Toujeo 30 units once daily and humalog 7 units with meals.  Reports hypoglycemia after supper due to humalog.  Reports fasting blood sugars in the morning around 150.  Denies polyuria, polydypsia, and blurred vision.  Denies chest pain, sob, doe.  Does report depression, anhedonia, insomnia, poor energy, lethargy, and apathy.  Denies suicidal ideation.  Denies myalgias or RUQ pain.  Overdue for FLP.   Past Medical History  Diagnosis Date  . PONV (postoperative nausea and vomiting)   . Allergy   . Anxiety 2003  . Depression 1998  . Diabetes mellitus without complication (HCC) 1998  . GERD (gastroesophageal reflux disease) 1980  . Hyperlipidemia 1990  . Hypothyroidism 1998  . HLD (hyperlipidemia)    Past Surgical History  Procedure Laterality Date  . Tonsillectomy    . Ectopic pregnancy surgery    . Back surgery      x3 Lumbar  . Esophageal dilation      x3  . Hysteroscopy w/d&c N/A 08/17/2013    Procedure: DILATATION AND CURETTAGE /HYSTEROSCOPY;  Surgeon: Mickel Baas, MD;  Location: [facility];  Service: Gynecology;  Laterality: N/A;  YAG LASER  . Cervical polypectomy N/A 08/17/2013    Procedure: CERVICAL POLYPECTOMY;  Surgeon: Mickel Baas,  MD;  Location: [facility];  Service: Gynecology;  Laterality: N/A;  . Neck surgery      x 1   . Colon surgery  2007    benign mass   Current Outpatient Prescriptions on File Prior to Visit  Medication Sig Dispense Refill  . aspirin EC 81 MG tablet Take 81 mg by mouth daily.    . Cholecalciferol (VITAMIN D PO) Take 4,000 Int'l Units by mouth daily.    . cyclobenzaprine (FLEXERIL) 10 MG tablet Take 10 mg by mouth 3 (three) times daily.    Marland Kitchen gabapentin (NEURONTIN) 600 MG tablet Take 1 tablet in the am, 2 tablets at noon, 1 tablet at 5, 2 tablets at bedtime  0  . ibuprofen (ADVIL,MOTRIN) 800 MG tablet Take 800 mg by mouth 4 (four) times daily as needed.    . Insulin Glargine (TOUJEO SOLOSTAR) 300 UNIT/ML SOPN Inject 30 Units into the skin daily. 3 pen 5  . insulin lispro (HUMALOG) 100 UNIT/ML KiwkPen Inject 0.07 mLs (7 Units total) into the skin daily. At supper 15 mL 11  . Insulin Pen Needle (CAREFINE PEN NEEDLES) 32G X 4 MM MISC Use as directed 100 each 11  . levothyroxine (SYNTHROID, LEVOTHROID) 175 MCG tablet Take 1 tablet (175 mcg total) by mouth daily before breakfast. 90 tablet 1  . metFORMIN (GLUCOPHAGE) 1000 MG tablet Take 1 tablet (1,000 mg total) by mouth 2 (  two) times daily with a meal. 60 tablet 5  . OVER THE COUNTER MEDICATION [place] Patch Apply 1 Patch Daily As Needed For Pain    . pantoprazole (PROTONIX) 40 MG tablet Take 1 tablet (40 mg total) by mouth daily. 90 tablet 3  . pravastatin (PRAVACHOL) 40 MG tablet Take 1 tablet (40 mg total) by mouth daily with supper. 90 tablet 1  . sertraline (ZOLOFT) 100 MG tablet take 1 tablet by mouth twice a day 60 tablet 0   No current facility-administered medications on file prior to visit.   Allergies  Allergen Reactions  . Baclofen Other (See Comments)    Memory Loss and shakes  . Morphine And Related Itching  . Talwin [Pentazocine] Itching  . Toradol [Ketorolac Tromethamine] Other (See Comments)    Slurred speech, confusion  .  Penicillins Rash   Social History   Social History  . Marital Status: Legally Separated    Spouse Name: N/A  . Number of Children: 1  . Years of Education: N/A   Occupational History  . Nurse    Social History Main Topics  . Smoking status: Never Smoker   . Smokeless tobacco: Never Used  . Alcohol Use: No  . Drug Use: No  . Sexual Activity: Not Currently   Other Topics Concern  . Not on file   Social History Narrative   Separated. Son lives with her.    On Disability--secondary to back. On disability since 2012.   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    Never smoked.       Review of Systems  All other systems reviewed and are negative.      Objective:   Physical Exam  Cardiovascular: Normal rate, regular rhythm and normal heart sounds.   No murmur heard. Pulmonary/Chest: Effort normal and breath sounds normal. No respiratory distress. She has no wheezes. She has no rales. She exhibits no tenderness.  Abdominal: Soft. Bowel sounds are normal. She exhibits no distension. There is no tenderness. There is no rebound and no guarding.  Musculoskeletal: She exhibits no edema.  Vitals reviewed.         Assessment & Plan:  Depression - Plan: venlafaxine XR (EFFEXOR-XR) 75 MG 24 hr capsule  Controlled type 2 diabetes mellitus with hypoglycemia, with long-term current use of insulin (HCC) - Plan: CBC with Differential/Platelet, COMPLETE METABOLIC PANEL WITH GFR, Hemoglobin A1c, Lipid panel, Microalbumin, urine  Other specified hypothyroidism - Plan: TSH Wean off zoloft by decreasing to 100 mg poqday for 1 week then stop.  Start effexor xr 75 mg poqam and increase to 150 mg poqam in 1 week.  Check Hga1c.  Will likely increase toujeo to 35 uits daiy and D/C humalog.  Check TSH to ensure hypothyroidism is not contributing to depression.  Blood pressure is normal.  Check FLP  and goal LDL should be less than 100.  Recheck in 1 month.

## 2015-08-24 LAB — HEMOGLOBIN A1C
Hgb A1c MFr Bld: 9.2 % — ABNORMAL HIGH (ref <5.7)
Mean Plasma Glucose: 217 mg/dL — ABNORMAL HIGH (ref <117)

## 2015-08-25 ENCOUNTER — Other Ambulatory Visit: Payer: Self-pay | Admitting: *Deleted

## 2015-08-25 MED ORDER — PRAVASTATIN SODIUM 40 MG PO TABS: 40.0000 mg | ORAL_TABLET | Freq: Every day | ORAL | Status: DC

## 2015-08-25 NOTE — Telephone Encounter (Signed)
Received fax requesting refill on Pravastatin.   Refill appropriate and filled per protocol. 

## 2015-08-26 ENCOUNTER — Telehealth: Payer: Self-pay | Admitting: Family Medicine

## 2015-08-26 DIAGNOSIS — Z79899 Other long term (current) drug therapy: Secondary | ICD-10-CM

## 2015-08-26 DIAGNOSIS — E039 Hypothyroidism, unspecified: Secondary | ICD-10-CM

## 2015-08-26 DIAGNOSIS — E611 Iron deficiency: Secondary | ICD-10-CM

## 2015-08-26 MED ORDER — LEVOTHYROXINE SODIUM 150 MCG PO TABS: 150.0000 ug | ORAL_TABLET | Freq: Every day | ORAL | Status: DC

## 2015-08-26 NOTE — Telephone Encounter (Signed)
Pt aware of lab results and provider recommendations.  New Rx to pharmacy.  To call in 1 week with BS readings.  Hemoccult cards left for pick up at front desk.  Future labs ordered

## 2015-08-26 NOTE — Telephone Encounter (Signed)
-----   Message from Donita Brooks, MD sent at 08/25/2015  7:30 AM EST ----- Cholesterol is excellent.  Appears to have low iron.  I would like to check stool cards x 3 to rule out bleed.  On too much thyroid medication so decrease levothyroxine to 150 mcg poqday and recheck tsh in 8 months.  Diabetes test is poor.  I want her to record fbs and 2 hr after supper sugars and provide them to me in 1 week so I can titrate her insulin accordingly.

## 2015-08-31 ENCOUNTER — Other Ambulatory Visit: Payer: Self-pay | Admitting: Family Medicine

## 2015-08-31 MED ORDER — PRAVASTATIN SODIUM 40 MG PO TABS: 40.0000 mg | ORAL_TABLET | Freq: Every day | ORAL | Status: DC

## 2015-09-02 ENCOUNTER — Telehealth: Payer: Self-pay | Admitting: Family Medicine

## 2015-09-02 NOTE — Telephone Encounter (Signed)
Patient calling to say that the synthroid prescribed is too epensive and would to know if there is alternative  [phone]

## 2015-09-02 NOTE — Telephone Encounter (Signed)
Pt was not talking about her synthroid but her insulin Toujeo - called and spoke to pharm and if we put 35-45 units then the pharmacists can dispense one box of pens to equal 1 month instead of 30 units and 1 box would be 2 copay and that should last 45 days. RX called in for 35-45 units daily but she is only taking at this moment 30 units daily. Pt is aware

## 2015-09-09 ENCOUNTER — Ambulatory Visit: Payer: Medicare Other | Admitting: Neurology

## 2015-09-14 ENCOUNTER — Ambulatory Visit: Payer: Medicare Other | Admitting: Gastroenterology

## 2015-10-10 ENCOUNTER — Encounter: Payer: Self-pay | Admitting: Family Medicine

## 2015-10-24 ENCOUNTER — Ambulatory Visit (INDEPENDENT_AMBULATORY_CARE_PROVIDER_SITE_OTHER): Payer: Medicare Other | Admitting: Family Medicine

## 2015-10-24 ENCOUNTER — Other Ambulatory Visit: Payer: Self-pay | Admitting: Family Medicine

## 2015-10-24 ENCOUNTER — Encounter: Payer: Self-pay | Admitting: Family Medicine

## 2015-10-24 VITALS — BP 110/66 | HR 94 | Temp 98.0°F | Resp 18 | Ht 59.0 in | Wt 182.0 lb

## 2015-10-24 DIAGNOSIS — R053 Chronic cough: Secondary | ICD-10-CM

## 2015-10-24 DIAGNOSIS — R05 Cough: Secondary | ICD-10-CM | POA: Diagnosis not present

## 2015-10-24 DIAGNOSIS — Z111 Encounter for screening for respiratory tuberculosis: Secondary | ICD-10-CM | POA: Diagnosis not present

## 2015-10-24 NOTE — Progress Notes (Signed)
Subjective:    Patient ID: Marissa Thompson, female    DOB: 07/03/1951, 65 y.o.   MRN: [id number]  HPI the patient reports 2-3 months of a persistent chronic cough. Cough is nonproductive. She denies any hemoptysis. She denies any chest pain. She denies any fevers or chills or night sweats. The cough is more of an aggravation. She denies any pleurisy. She does report some congestion in her right lung that otherwise her history is unremarkable. She denies any weight loss. She denies any exposure to TB or whooping cough that she is aware of. She denies any acid reflux. She denies any allergies, rhinorrhea, postnasal drip, or sinus congestion. She denies any wheezing or history of asthma. She has no history of smoking although she does have a history of secondhand tobacco smoke closure   Past Medical History  Diagnosis Date  . PONV (postoperative nausea and vomiting)   . Allergy   . Anxiety 2003  . Depression 1998  . Diabetes mellitus without complication (HCC) 1998  . GERD (gastroesophageal reflux disease) 1980  . Hyperlipidemia 1990  . Hypothyroidism 1998  . HLD (hyperlipidemia)    Past Surgical History  Procedure Laterality Date  . Tonsillectomy    . Ectopic pregnancy surgery    . Back surgery      x3 Lumbar  . Esophageal dilation      x3  . Hysteroscopy w/d&c N/A 08/17/2013    Procedure: DILATATION AND CURETTAGE /HYSTEROSCOPY;  Surgeon: Mickel Baasichard D Kaplan, MD;  Location: [facility];  Service: Gynecology;  Laterality: N/A;  YAG LASER  . Cervical polypectomy N/A 08/17/2013    Procedure: CERVICAL POLYPECTOMY;  Surgeon: Mickel Baasichard D Kaplan, MD;  Location: [facility];  Service: Gynecology;  Laterality: N/A;  . Neck surgery      c5-7 ACDF 3/15  . Colon surgery  2007    benign mass   Current Outpatient Prescriptions on File Prior to Visit  Medication Sig Dispense Refill  . aspirin EC 81 MG tablet Take 81 mg by mouth daily.    . Cholecalciferol (VITAMIN D PO) Take 4,000 Int'l Units by mouth daily.      . cyclobenzaprine (FLEXERIL) 10 MG tablet Take 10 mg by mouth 3 (three) times daily.    Marland Kitchen. gabapentin (NEURONTIN) 600 MG tablet Take 1 tablet in the am, 2 tablets at noon, 1 tablet at 5, 2 tablets at bedtime  0  . ibuprofen (ADVIL,MOTRIN) 800 MG tablet Take 800 mg by mouth 4 (four) times daily as needed.    . Insulin Glargine (TOUJEO SOLOSTAR) 300 UNIT/ML SOPN Inject 30 Units into the skin daily. 3 pen 5  . insulin lispro (HUMALOG) 100 UNIT/ML KiwkPen Inject 0.07 mLs (7 Units total) into the skin daily. At supper 15 mL 11  . Insulin Pen Needle (CAREFINE PEN NEEDLES) 32G X 4 MM MISC Use as directed 100 each 11  . metFORMIN (GLUCOPHAGE) 1000 MG tablet Take 1 tablet (1,000 mg total) by mouth 2 (two) times daily with a meal. 60 tablet 5  . OVER THE COUNTER MEDICATION Solan Pass Patch Apply 1 Patch Daily As Needed For Pain    . pantoprazole (PROTONIX) 40 MG tablet Take 1 tablet (40 mg total) by mouth daily. 90 tablet 3  . pravastatin (PRAVACHOL) 40 MG tablet Take 1 tablet (40 mg total) by mouth daily with supper. 90 tablet 1  . PRECISION XTRA TEST STRIPS test strip   0  . venlafaxine XR (EFFEXOR-XR) 75 MG 24 hr capsule Take  2 capsules (150 mg total) by mouth daily with breakfast. 60 capsule 3   No current facility-administered medications on file prior to visit.   Allergies  Allergen Reactions  . Baclofen Other (See Comments)    Memory Loss and shakes  . Morphine And Related Itching  . Talwin [Pentazocine] Itching  . Toradol [Ketorolac Tromethamine] Other (See Comments)    Slurred speech, confusion  . Penicillins Rash   Social History   Social History  . Marital Status: Legally Separated    Spouse Name: N/A  . Number of Children: 1  . Years of Education: N/A   Occupational History  . Nurse    Social History Main Topics  . Smoking status: Never Smoker   . Smokeless tobacco: Never Used  . Alcohol Use: No  . Drug Use: No  . Sexual Activity: Not Currently   Other Topics Concern  .  Not on file   Social History Narrative   Separated. Son lives with her.    On Disability--secondary to back. On disability since 2012.   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    Never smoked.       Review of Systems  All other systems reviewed and are negative.      Objective:   Physical Exam  Cardiovascular: Normal rate, regular rhythm and normal heart sounds.   No murmur heard. Pulmonary/Chest: Effort normal and breath sounds normal. No respiratory distress. She has no wheezes. She has no rales. She exhibits no tenderness.  Abdominal: Soft. Bowel sounds are normal. She exhibits no distension. There is no tenderness. There is no rebound and no guarding.  Musculoskeletal: She exhibits no edema.  Vitals reviewed.         Assessment & Plan:  Chronic cough - Plan: CBC with Differential/Platelet, COMPLETE METABOLIC PANEL WITH GFR, DG Chest 2 View, Bordetella pertussis antibody, CANCELED: B pertussis IgG/IgM Ab  Screening-pulmonary TB - Plan: PPD  There is no evidence of postnasal drip or allergies causing her cough. Laryngo-esophageal reflux is certainly a possibility but she is already on a proton pump inhibitor. Begin by obtaining a chest x-ray to rule out pulmonary malignancy as well as lymphoma. I will also check a TB test to evaluate for exposure to tuberculosis as well as pertussis antibodies to rule out whooping cough. If all of his lab work is normal, consider pulmonary function test to evaluate for cough variant asthma. Patient also has a history of some mild laryngeal penetration on modified barium swallow which may cause some upper airway irritation and coughing as well

## 2015-10-25 LAB — CBC WITH DIFFERENTIAL/PLATELET
BASOS PCT: 1 %
Basophils Absolute: 57 cells/uL (ref 0–200)
EOS PCT: 1 %
Eosinophils Absolute: 57 cells/uL (ref 15–500)
HEMATOCRIT: 37.3 % (ref 35.0–45.0)
HEMOGLOBIN: 11.5 g/dL — AB (ref 12.0–15.0)
LYMPHS ABS: 1596 {cells}/uL (ref 850–3900)
Lymphocytes Relative: 28 %
MCH: 25 pg — ABNORMAL LOW (ref 27.0–33.0)
MCHC: 30.8 g/dL — AB (ref 32.0–36.0)
MCV: 81.1 fL (ref 80.0–100.0)
MPV: 9.1 fL (ref 7.5–12.5)
Monocytes Absolute: 285 cells/uL (ref 200–950)
Monocytes Relative: 5 %
Neutro Abs: 3705 cells/uL (ref 1500–7800)
Neutrophils Relative %: 65 %
Platelets: 235 10*3/uL (ref 140–400)
RBC: 4.6 MIL/uL (ref 3.80–5.10)
RDW: 16.5 % — ABNORMAL HIGH (ref 11.0–15.0)
WBC: 5.7 10*3/uL (ref 3.8–10.8)

## 2015-10-25 LAB — COMPLETE METABOLIC PANEL WITH GFR
ALBUMIN: 3.8 g/dL (ref 3.6–5.1)
ALK PHOS: 133 U/L — AB (ref 33–130)
ALT: 20 U/L (ref 6–29)
AST: 23 U/L (ref 10–35)
BUN: 8 mg/dL (ref 7–25)
CALCIUM: 9 mg/dL (ref 8.6–10.4)
CO2: 24 mmol/L (ref 20–31)
CREATININE: 0.63 mg/dL (ref 0.50–0.99)
Chloride: 100 mmol/L (ref 98–110)
GFR, Est African American: >89 mL/min (ref >=60)
GFR, Est Non African American: >89 mL/min (ref >=60)
GLUCOSE: 229 mg/dL — AB (ref 70–99)
POTASSIUM: 4.4 mmol/L (ref 3.5–5.3)
SODIUM: 136 mmol/L (ref 135–146)
Total Bilirubin: 0.3 mg/dL (ref 0.2–1.2)
Total Protein: 6.5 g/dL (ref 6.1–8.1)

## 2015-10-27 ENCOUNTER — Other Ambulatory Visit: Payer: Self-pay | Admitting: *Deleted

## 2015-10-27 MED ORDER — METFORMIN HCL 1000 MG PO TABS: 1000.0000 mg | ORAL_TABLET | Freq: Two times a day (BID) | ORAL | Status: DC

## 2015-10-27 NOTE — Telephone Encounter (Signed)
Received fax requesting refill on MTF.   Refill appropriate and filled per protocol. 

## 2015-10-29 LAB — BORDETELLA PERTUSSIS AB IGG,IGA
FHA IgA: 76 IU/mL — ABNORMAL HIGH
FHA IgG: 675 IU/mL — ABNORMAL HIGH
PT IGA: 6 [IU]/mL
PT IgG: 20 IU/mL

## 2015-10-31 ENCOUNTER — Encounter: Payer: Self-pay | Admitting: Family Medicine

## 2015-11-02 ENCOUNTER — Ambulatory Visit
Admission: RE | Admit: 2015-11-02 | Discharge: 2015-11-02 | Disposition: A | Discharge status: Home / Self Care | Payer: Medicare Other | Source: Ambulatory Visit | Attending: Family Medicine | Admitting: Family Medicine

## 2015-11-02 DIAGNOSIS — R053 Chronic cough: Secondary | ICD-10-CM

## 2015-11-02 DIAGNOSIS — R05 Cough: Secondary | ICD-10-CM

## 2015-11-03 ENCOUNTER — Telehealth: Payer: Self-pay | Admitting: *Deleted

## 2015-11-03 NOTE — Telephone Encounter (Signed)
Received call from patient.   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   MD to be made aware.

## 2015-11-20 ENCOUNTER — Encounter: Payer: Self-pay | Admitting: Family Medicine

## 2015-11-21 MED ORDER — PRECISION XTRA BLOOD GLUCOSE VI STRP: ORAL_STRIP | Status: DC

## 2015-12-19 ENCOUNTER — Other Ambulatory Visit: Payer: Self-pay

## 2015-12-19 MED ORDER — LEVOTHYROXINE SODIUM 150 MCG PO TABS: 150.0000 ug | ORAL_TABLET | Freq: Every day | ORAL | Status: DC

## 2015-12-21 DIAGNOSIS — M720 Palmar fascial fibromatosis [Dupuytren]: Secondary | ICD-10-CM | POA: Insufficient documentation

## 2016-01-25 ENCOUNTER — Other Ambulatory Visit: Payer: Self-pay | Admitting: Family Medicine

## 2016-01-25 DIAGNOSIS — F32A Depression, unspecified: Secondary | ICD-10-CM

## 2016-01-25 DIAGNOSIS — F329 Major depressive disorder, single episode, unspecified: Secondary | ICD-10-CM

## 2016-01-25 MED ORDER — VENLAFAXINE HCL ER 75 MG PO CP24: 150.0000 mg | ORAL_CAPSULE | Freq: Every day | ORAL | Status: DC

## 2016-01-25 NOTE — Telephone Encounter (Signed)
Medication called/sent to requested pharmacy  

## 2016-02-02 ENCOUNTER — Ambulatory Visit (INDEPENDENT_AMBULATORY_CARE_PROVIDER_SITE_OTHER): Payer: Medicare Other | Admitting: Family Medicine

## 2016-02-02 ENCOUNTER — Encounter: Payer: Self-pay | Admitting: Family Medicine

## 2016-02-02 ENCOUNTER — Other Ambulatory Visit: Payer: Self-pay | Admitting: Orthopedic Surgery

## 2016-02-02 ENCOUNTER — Ambulatory Visit: Payer: Medicare Other | Admitting: Family Medicine

## 2016-02-02 VITALS — BP 122/84 | Temp 98.0°F | Wt 183.0 lb

## 2016-02-02 DIAGNOSIS — A6920 Lyme disease, unspecified: Secondary | ICD-10-CM | POA: Diagnosis not present

## 2016-02-02 MED ORDER — DOXYCYCLINE HYCLATE 100 MG PO TABS: 100.0000 mg | ORAL_TABLET | Freq: Two times a day (BID) | ORAL | Status: DC

## 2016-02-02 MED ORDER — CYCLOBENZAPRINE HCL 10 MG PO TABS: 10.0000 mg | ORAL_TABLET | Freq: Three times a day (TID) | ORAL | Status: DC

## 2016-02-02 MED ORDER — GABAPENTIN 600 MG PO TABS: ORAL_TABLET | ORAL | Status: DC

## 2016-02-02 NOTE — Progress Notes (Signed)
Subjective:    Patient ID: Marissa Thompson, female    DOB: 08/21/1950, 65 y.o.   MRN: [id number]  HPI Patient was bitten by a tick on the upper left chest approximately 3 weeks ago. She states that shortly thereafter she developed a spreading red ring spreading out circumferentially from the bite mark consistent with erythema migrans. The rash is slowly faded over the last week it is not apparent today she denies any fevers or chills or joint pains or headaches or neck stiffness Past Medical History  Diagnosis Date  . PONV (postoperative nausea and vomiting)   . Allergy   . Anxiety 2003  . Depression 1998  . Diabetes mellitus without complication (HCC) 1998  . GERD (gastroesophageal reflux disease) 1980  . Hyperlipidemia 1990  . Hypothyroidism 1998  . HLD (hyperlipidemia)    Past Surgical History  Procedure Laterality Date  . Tonsillectomy    . Ectopic pregnancy surgery    . Back surgery      x3 Lumbar  . Esophageal dilation      x3  . Hysteroscopy w/d&c N/A 08/17/2013    Procedure: DILATATION AND CURETTAGE /HYSTEROSCOPY;  Surgeon: Mickel Baas, MD;  Location: [facility];  Service: Gynecology;  Laterality: N/A;  YAG LASER  . Cervical polypectomy N/A 08/17/2013    Procedure: CERVICAL POLYPECTOMY;  Surgeon: Mickel Baas, MD;  Location: [facility];  Service: Gynecology;  Laterality: N/A;  . Neck surgery      c5-7 ACDF 3/15  . Colon surgery  2007    benign mass   Current Outpatient Prescriptions on File Prior to Visit  Medication Sig Dispense Refill  . aspirin EC 81 MG tablet Take 81 mg by mouth daily.    . Cholecalciferol (VITAMIN D PO) Take 4,000 Int'l Units by mouth daily.    Marland Kitchen ibuprofen (ADVIL,MOTRIN) 800 MG tablet Take 800 mg by mouth 4 (four) times daily as needed.    . Insulin Glargine (TOUJEO SOLOSTAR) 300 UNIT/ML SOPN Inject 30 Units into the skin daily. 3 pen 5  . insulin lispro (HUMALOG) 100 UNIT/ML KiwkPen Inject 0.07 mLs (7 Units total) into the skin daily. At supper 15  mL 11  . Insulin Pen Needle (CAREFINE PEN NEEDLES) 32G X 4 MM MISC Use as directed 100 each 11  . levothyroxine (SYNTHROID, LEVOTHROID) 150 MCG tablet Take 1 tablet (150 mcg total) by mouth daily. 90 tablet 0  . metFORMIN (GLUCOPHAGE) 1000 MG tablet Take 1 tablet (1,000 mg total) by mouth 2 (two) times daily with a meal. 60 tablet 5  . OVER THE COUNTER MEDICATION Solan Pass Patch Apply 1 Patch Daily As Needed For Pain    . pantoprazole (PROTONIX) 40 MG tablet Take 1 tablet (40 mg total) by mouth daily. 90 tablet 3  . pravastatin (PRAVACHOL) 40 MG tablet Take 1 tablet (40 mg total) by mouth daily with supper. 90 tablet 1  . PRECISION XTRA TEST STRIPS test strip Use to monitor FSBS 3x daily. Dx: E11.9. 100 each 11  . venlafaxine XR (EFFEXOR-XR) 75 MG 24 hr capsule Take 2 capsules (150 mg total) by mouth daily with breakfast. 60 capsule 5   No current facility-administered medications on file prior to visit.   Allergies  Allergen Reactions  . Baclofen Other (See Comments)    Memory Loss and shakes  . Morphine And Related Itching  . Talwin [Pentazocine] Itching  . Toradol [Ketorolac Tromethamine] Other (See Comments)    Slurred speech, confusion  . Penicillins  Rash   Social History   Social History  . Marital Status: Legally Separated    Spouse Name: N/A  . Number of Children: 1  . Years of Education: N/A   Occupational History  . Nurse    Social History Main Topics  . Smoking status: Never Smoker   . Smokeless tobacco: Never Used  . Alcohol Use: No  . Drug Use: No  . Sexual Activity: Not Currently   Other Topics Concern  . Not on file   Social History Narrative   Separated. Son lives with her.    On Disability--secondary to back. On disability since 2012.   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    Never smoked.       Review of Systems  All other systems reviewed and  are negative.      Objective:   Physical Exam  Cardiovascular: Normal rate, regular rhythm and normal heart sounds.   Pulmonary/Chest: Effort normal and breath sounds normal.  Abdominal: Soft. Bowel sounds are normal.  Lymphadenopathy:    She has no cervical adenopathy.  Skin: No rash noted. No erythema.  Vitals reviewed.         Assessment & Plan:  Erythema migrans (Lyme disease) - Plan: doxycycline (VIBRA-TABS) 100 MG tablet  Her description sounds like erythema migrans. I showed her pictures of this and she agrees that that is what it looked like. I will treat her empirically with doxycycline 100 mg by mouth twice a day for a total of 21 days.

## 2016-03-01 ENCOUNTER — Encounter: Payer: Self-pay | Admitting: Family Medicine

## 2016-03-01 ENCOUNTER — Ambulatory Visit (INDEPENDENT_AMBULATORY_CARE_PROVIDER_SITE_OTHER): Payer: Medicare Other | Admitting: Family Medicine

## 2016-03-01 VITALS — BP 152/94 | HR 82 | Temp 98.0°F | Resp 16 | Ht 59.0 in | Wt 180.0 lb

## 2016-03-01 DIAGNOSIS — E11649 Type 2 diabetes mellitus with hypoglycemia without coma: Secondary | ICD-10-CM

## 2016-03-01 DIAGNOSIS — E039 Hypothyroidism, unspecified: Secondary | ICD-10-CM | POA: Diagnosis not present

## 2016-03-01 DIAGNOSIS — Z794 Long term (current) use of insulin: Secondary | ICD-10-CM | POA: Diagnosis not present

## 2016-03-01 DIAGNOSIS — E785 Hyperlipidemia, unspecified: Secondary | ICD-10-CM | POA: Diagnosis not present

## 2016-03-01 DIAGNOSIS — I10 Essential (primary) hypertension: Secondary | ICD-10-CM | POA: Diagnosis not present

## 2016-03-01 MED ORDER — BUPROPION HCL ER (XL) 150 MG PO TB24: 150.0000 mg | ORAL_TABLET | Freq: Every day | ORAL | 3 refills | Status: DC

## 2016-03-01 MED ORDER — SULFAMETHOXAZOLE-TRIMETHOPRIM 800-160 MG PO TABS: 1.0000 | ORAL_TABLET | Freq: Two times a day (BID) | ORAL | 0 refills | Status: DC

## 2016-03-01 NOTE — Progress Notes (Signed)
Subjective:    Patient ID: Marissa Thompson, female    DOB: 03/10/1951, 65 y.o.   MRN: [id number]  HPI  02/24/15 She is here today for follow-up of her diabetes. When I last checked her hemoglobin A1c this summer it was 7.4. This is better than previously for this patient due to her noncompliance. However the patient wants to stop Actos and glipizide because of the weight gain it is causing her. She has gained 13 pounds since May. She is willing now to pain a higher co-pay for medication that will not cause weight gain.  At that time, my plan was:  Continue metformin. Discontinue glipizide. Discontinue Actos. Begin Januvia 100 mg by mouth daily. Again in the, 300 mg by mouth daily. Recheck lab ork in 3 months.  08/22/14 Ultimately, patient is now on Toujeo 30 units once daily and humalog 7 units with meals.  Reports hypoglycemia after supper due to humalog.  Reports fasting blood sugars in the morning around 150.  Denies polyuria, polydypsia, and blurred vision.  Denies chest pain, sob, doe.  Does report depression, anhedonia, insomnia, poor energy, lethargy, and apathy.  Denies suicidal ideation.  Denies myalgias or RUQ pain.  Overdue for FLP.  AT that time, my plan was: Wean off zoloft by decreasing to 100 mg poqday for 1 week then stop.  Start effexor xr 75 mg poqam and increase to 150 mg poqam in 1 week.  Check Hga1c.  Will likely increase toujeo to 35 uits daiy and D/C humalog.  Check TSH to ensure hypothyroidism is not contributing to depression.  Blood pressure is normal.  Check FLP and goal LDL should be less than 100.  Recheck in 1 month.  03/01/16 Patient does not feel that the Effexor is helping adequately with her depression. She says that is working about as well as the Zoloft. She continues to report anhedonia and lack of desire to do anything. She also reports insomnia poor energy lethargy and apathy. Her blood pressure today is elevated however this is an aberration. Usually her blood  pressures well controlled. I rechecked her blood pressure and found to be 142/82. 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 She is currently on insulin 35 units a day. Her fasting blood sugars are between 101 120. She is not checking 2 hour postprandial sugars. She denies any hypoglycemic episodes. She denies any chest pain shortness of breath or dyspnea on exertion. She is overdue for TSH as well as a fasting lipid panel Past Medical History:  Diagnosis Date  . Allergy   . Anxiety 2003  . Depression 1998  . Diabetes mellitus without complication (HCC) 1998  . GERD (gastroesophageal reflux disease) 1980  . HLD (hyperlipidemia)   . Hyperlipidemia 1990  . Hypothyroidism 1998  . PONV (postoperative nausea and vomiting)    Past Surgical History:  Procedure Laterality Date  . BACK SURGERY     x3 Lumbar  . CERVICAL POLYPECTOMY N/A 08/17/2013   Procedure: CERVICAL POLYPECTOMY;  Surgeon: Mickel Baasichard D Kaplan, MD;  Location: [facility];  Service: Gynecology;  Laterality: N/A;  . COLON SURGERY  2007   benign mass  . ECTOPIC PREGNANCY SURGERY    . ESOPHAGEAL DILATION     x3  . HYSTEROSCOPY W/D&C N/A 08/17/2013   Procedure: DILATATION AND CURETTAGE /HYSTEROSCOPY;  Surgeon: Mickel Baasichard D Kaplan, MD;  Location: [facility];  Service: Gynecology;  Laterality: N/A;  YAG LASER  . NECK SURGERY  c5-7 ACDF 3/15  . TONSILLECTOMY     Current Outpatient Prescriptions on File Prior to Visit  Medication Sig Dispense Refill  . aspirin EC 81 MG tablet Take 81 mg by mouth daily.    . Cholecalciferol (VITAMIN D PO) Take 4,000 Int'l Units by mouth daily.    . cyclobenzaprine (FLEXERIL) 10 MG tablet Take 1 tablet (10 mg total) by mouth 3 (three) times daily. 60 tablet 4  . gabapentin (NEURONTIN) 600 MG tablet Take 1 tablet in the am, 2 tablets at noon, 1 tablet at 5, 2 tablets at bedtime 180 tablet 5  . ibuprofen (ADVIL,MOTRIN) 800 MG tablet Take 800 mg by mouth 4 (four) times daily as needed.     . Insulin Glargine (TOUJEO SOLOSTAR) 300 UNIT/ML SOPN Inject 30 Units into the skin daily. 3 pen 5  . insulin lispro (HUMALOG) 100 UNIT/ML KiwkPen Inject 0.07 mLs (7 Units total) into the skin daily. At supper 15 mL 11  . Insulin Pen Needle (CAREFINE PEN NEEDLES) 32G X 4 MM MISC Use as directed 100 each 11  . levothyroxine (SYNTHROID, LEVOTHROID) 150 MCG tablet Take 1 tablet (150 mcg total) by mouth daily. 90 tablet 0  . metFORMIN (GLUCOPHAGE) 1000 MG tablet Take 1 tablet (1,000 mg total) by mouth 2 (two) times daily with a meal. 60 tablet 5  . OVER THE COUNTER MEDICATION Solan Pass Patch Apply 1 Patch Daily As Needed For Pain    . pantoprazole (PROTONIX) 40 MG tablet Take 1 tablet (40 mg total) by mouth daily. 90 tablet 3  . pravastatin (PRAVACHOL) 40 MG tablet Take 1 tablet (40 mg total) by mouth daily with supper. 90 tablet 1  . PRECISION XTRA TEST STRIPS test strip Use to monitor FSBS 3x daily. Dx: E11.9. 100 each 11  . venlafaxine XR (EFFEXOR-XR) 75 MG 24 hr capsule Take 2 capsules (150 mg total) by mouth daily with breakfast. 60 capsule 5   No current facility-administered medications on file prior to visit.    Allergies  Allergen Reactions  . Baclofen Other (See Comments)    Memory Loss and shakes  . Morphine And Related Itching  . Talwin [Pentazocine] Itching  . Toradol [Ketorolac Tromethamine] Other (See Comments)    Slurred speech, confusion  . Penicillins Rash   Social History   Social History  . Marital status: Legally Separated    Spouse name: N/A  . Number of children: 1  . Years of education: N/A   Occupational History  . Nurse    Social History Main Topics  . Smoking status: Never Smoker  . Smokeless tobacco: Never Used  . Alcohol use No  . Drug use: No  . Sexual activity: Not Currently   Other Topics Concern  . Not on file   Social History Narrative   Separated. Son lives with her.    On Disability--secondary to back. On disability since 2012.   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    Never smoked.       Review of Systems  All other systems reviewed and are negative.      Objective:   Physical Exam  Cardiovascular: Normal rate, regular rhythm and normal heart sounds.   No murmur heard. Pulmonary/Chest: Effort normal and breath sounds normal. No respiratory distress. She has no wheezes. She has no rales. She exhibits no tenderness.  Abdominal: Soft. Bowel sounds  are normal. She exhibits no distension. There is no tenderness. There is no rebound and no guarding.  Musculoskeletal: She exhibits no edema.  Vitals reviewed.         Assessment & Plan:  Controlled type 2 diabetes mellitus with hypoglycemia, with long-term current use of insulin (HCC) - Plan: CBC with Differential/Platelet, COMPLETE METABOLIC PANEL WITH GFR, Lipid panel, Microalbumin, urine, Hemoglobin A1c  Hypothyroidism, unspecified hypothyroidism type - Plan: TSH  Benign essential HTN  HLD (hyperlipidemia) - Plan: Lipid panel  Regarding her depression, I will start the patient on Wellbutrin extended release 150 mg by mouth every morning and recheck in one month. Continue Effexor. Use Wellbutrin as an augmentation. Blood pressures borderline. She will check this at home and notify me of the values in 2 weeks area regarding her hypothyroidism, check a TSH. I will have the patient return for fasting lipid panel to monitor her cholesterol. Her goal LDL cholesterol is less than 100. Her diabetes seems well controlled. Diabetic foot exam is performed today. I will check an A1c and her goal is less than 7.

## 2016-03-02 LAB — LIPID PANEL
Cholesterol: 127 mg/dL (ref 125–200)
HDL: 81 mg/dL (ref >=46)
LDL CALC: 23 mg/dL (ref <130)
Total CHOL/HDL Ratio: 1.6 Ratio (ref <=5.0)
Triglycerides: 116 mg/dL (ref <150)
VLDL: 23 mg/dL (ref <30)

## 2016-03-02 LAB — COMPLETE METABOLIC PANEL WITH GFR
ALT: 17 U/L (ref 6–29)
AST: 20 U/L (ref 10–35)
Albumin: 3.4 g/dL — ABNORMAL LOW (ref 3.6–5.1)
Alkaline Phosphatase: 123 U/L (ref 33–130)
BUN: 10 mg/dL (ref 7–25)
CHLORIDE: 100 mmol/L (ref 98–110)
CO2: 24 mmol/L (ref 20–31)
CREATININE: 0.64 mg/dL (ref 0.50–0.99)
Calcium: 8.7 mg/dL (ref 8.6–10.4)
GFR, Est African American: >89 mL/min (ref >=60)
GFR, Est Non African American: >89 mL/min (ref >=60)
GLUCOSE: 130 mg/dL — AB (ref 70–99)
Potassium: 3.9 mmol/L (ref 3.5–5.3)
Sodium: 137 mmol/L (ref 135–146)
Total Bilirubin: 0.4 mg/dL (ref 0.2–1.2)
Total Protein: 6 g/dL — ABNORMAL LOW (ref 6.1–8.1)

## 2016-03-02 LAB — CBC WITH DIFFERENTIAL/PLATELET
BASOS ABS: 0 {cells}/uL (ref 0–200)
BASOS PCT: 0 %
Eosinophils Absolute: 92 cells/uL (ref 15–500)
Eosinophils Relative: 1 %
HEMATOCRIT: 39 % (ref 35.0–45.0)
Hemoglobin: 12.2 g/dL (ref 12.0–15.0)
LYMPHS ABS: 2300 {cells}/uL (ref 850–3900)
Lymphocytes Relative: 25 %
MCH: 24.3 pg — ABNORMAL LOW (ref 27.0–33.0)
MCHC: 31.3 g/dL — ABNORMAL LOW (ref 32.0–36.0)
MCV: 77.7 fL — ABNORMAL LOW (ref 80.0–100.0)
MPV: 9.4 fL (ref 7.5–12.5)
Monocytes Absolute: 460 cells/uL (ref 200–950)
Monocytes Relative: 5 %
NEUTROS PCT: 69 %
Neutro Abs: 6348 cells/uL (ref 1500–7800)
PLATELETS: 277 10*3/uL (ref 140–400)
RBC: 5.02 MIL/uL (ref 3.80–5.10)
RDW: 17.5 % — AB (ref 11.0–15.0)
WBC: 9.2 10*3/uL (ref 3.8–10.8)

## 2016-03-02 LAB — TSH: TSH: 0.16 m[IU]/L — AB

## 2016-03-02 LAB — HEMOGLOBIN A1C
Hgb A1c MFr Bld: 9.1 % — ABNORMAL HIGH (ref <5.7)
MEAN PLASMA GLUCOSE: 214 mg/dL

## 2016-03-06 ENCOUNTER — Encounter (HOSPITAL_BASED_OUTPATIENT_CLINIC_OR_DEPARTMENT_OTHER): Payer: Self-pay | Admitting: *Deleted

## 2016-03-07 ENCOUNTER — Other Ambulatory Visit: Payer: Self-pay

## 2016-03-07 ENCOUNTER — Encounter: Payer: Self-pay | Admitting: Family Medicine

## 2016-03-07 ENCOUNTER — Encounter (HOSPITAL_BASED_OUTPATIENT_CLINIC_OR_DEPARTMENT_OTHER)
Admission: RE | Admit: 2016-03-07 | Discharge: 2016-03-07 | Disposition: A | Discharge status: Home / Self Care | Payer: Medicare Other | Source: Ambulatory Visit | Attending: Orthopedic Surgery | Admitting: Orthopedic Surgery

## 2016-03-07 DIAGNOSIS — Z794 Long term (current) use of insulin: Secondary | ICD-10-CM | POA: Diagnosis not present

## 2016-03-07 DIAGNOSIS — E785 Hyperlipidemia, unspecified: Secondary | ICD-10-CM | POA: Diagnosis not present

## 2016-03-07 DIAGNOSIS — M65332 Trigger finger, left middle finger: Secondary | ICD-10-CM | POA: Diagnosis not present

## 2016-03-07 DIAGNOSIS — E119 Type 2 diabetes mellitus without complications: Secondary | ICD-10-CM | POA: Diagnosis not present

## 2016-03-07 DIAGNOSIS — F329 Major depressive disorder, single episode, unspecified: Secondary | ICD-10-CM | POA: Diagnosis not present

## 2016-03-07 DIAGNOSIS — F419 Anxiety disorder, unspecified: Secondary | ICD-10-CM | POA: Diagnosis not present

## 2016-03-07 DIAGNOSIS — G5602 Carpal tunnel syndrome, left upper limb: Secondary | ICD-10-CM | POA: Diagnosis present

## 2016-03-07 DIAGNOSIS — E039 Hypothyroidism, unspecified: Secondary | ICD-10-CM | POA: Diagnosis not present

## 2016-03-07 DIAGNOSIS — M65342 Trigger finger, left ring finger: Secondary | ICD-10-CM | POA: Diagnosis not present

## 2016-03-07 DIAGNOSIS — Z79899 Other long term (current) drug therapy: Secondary | ICD-10-CM | POA: Diagnosis not present

## 2016-03-07 DIAGNOSIS — Z7982 Long term (current) use of aspirin: Secondary | ICD-10-CM | POA: Diagnosis not present

## 2016-03-07 DIAGNOSIS — K219 Gastro-esophageal reflux disease without esophagitis: Secondary | ICD-10-CM | POA: Diagnosis not present

## 2016-03-08 ENCOUNTER — Encounter (HOSPITAL_BASED_OUTPATIENT_CLINIC_OR_DEPARTMENT_OTHER): Payer: Self-pay | Admitting: Certified Registered"

## 2016-03-08 ENCOUNTER — Ambulatory Visit (HOSPITAL_BASED_OUTPATIENT_CLINIC_OR_DEPARTMENT_OTHER): Payer: Medicare Other | Admitting: Certified Registered"

## 2016-03-08 ENCOUNTER — Encounter (HOSPITAL_BASED_OUTPATIENT_CLINIC_OR_DEPARTMENT_OTHER)
Admission: RE | Disposition: A | Discharge status: Home / Self Care | Payer: Self-pay | Source: Ambulatory Visit | Attending: Orthopedic Surgery

## 2016-03-08 ENCOUNTER — Ambulatory Visit (HOSPITAL_BASED_OUTPATIENT_CLINIC_OR_DEPARTMENT_OTHER)
Admission: RE | Admit: 2016-03-08 | Discharge: 2016-03-08 | Disposition: A | Discharge status: Home / Self Care | Payer: Medicare Other | Source: Ambulatory Visit | Attending: Orthopedic Surgery | Admitting: Orthopedic Surgery

## 2016-03-08 DIAGNOSIS — M65342 Trigger finger, left ring finger: Secondary | ICD-10-CM | POA: Diagnosis not present

## 2016-03-08 DIAGNOSIS — M65332 Trigger finger, left middle finger: Secondary | ICD-10-CM | POA: Diagnosis not present

## 2016-03-08 DIAGNOSIS — G5602 Carpal tunnel syndrome, left upper limb: Secondary | ICD-10-CM | POA: Insufficient documentation

## 2016-03-08 DIAGNOSIS — Z7982 Long term (current) use of aspirin: Secondary | ICD-10-CM | POA: Insufficient documentation

## 2016-03-08 DIAGNOSIS — Z79899 Other long term (current) drug therapy: Secondary | ICD-10-CM | POA: Insufficient documentation

## 2016-03-08 DIAGNOSIS — Z794 Long term (current) use of insulin: Secondary | ICD-10-CM | POA: Insufficient documentation

## 2016-03-08 DIAGNOSIS — K219 Gastro-esophageal reflux disease without esophagitis: Secondary | ICD-10-CM | POA: Insufficient documentation

## 2016-03-08 DIAGNOSIS — F419 Anxiety disorder, unspecified: Secondary | ICD-10-CM | POA: Diagnosis not present

## 2016-03-08 DIAGNOSIS — E119 Type 2 diabetes mellitus without complications: Secondary | ICD-10-CM | POA: Insufficient documentation

## 2016-03-08 DIAGNOSIS — E785 Hyperlipidemia, unspecified: Secondary | ICD-10-CM | POA: Insufficient documentation

## 2016-03-08 DIAGNOSIS — E039 Hypothyroidism, unspecified: Secondary | ICD-10-CM | POA: Insufficient documentation

## 2016-03-08 DIAGNOSIS — F329 Major depressive disorder, single episode, unspecified: Secondary | ICD-10-CM | POA: Insufficient documentation

## 2016-03-08 HISTORY — PX: TRIGGER FINGER RELEASE: SHX641

## 2016-03-08 HISTORY — PX: CARPAL TUNNEL RELEASE: SHX101

## 2016-03-08 LAB — GLUCOSE, CAPILLARY
GLUCOSE-CAPILLARY: 126 mg/dL — AB (ref 65–99)
Glucose-Capillary: 129 mg/dL — ABNORMAL HIGH (ref 65–99)

## 2016-03-08 SURGERY — CARPAL TUNNEL RELEASE
Anesthesia: General | Site: Wrist | Laterality: Left

## 2016-03-08 MED ORDER — FENTANYL CITRATE (PF) 100 MCG/2ML IJ SOLN
INTRAMUSCULAR | Status: AC
  Filled 2016-03-08: qty 2

## 2016-03-08 MED ORDER — HYDROCODONE-ACETAMINOPHEN 5-325 MG PO TABS
ORAL_TABLET | ORAL | Status: AC
  Filled 2016-03-08: qty 1

## 2016-03-08 MED ORDER — LACTATED RINGERS IV SOLN
INTRAVENOUS | Status: DC
  Administered 2016-03-08: 09:00:00 via INTRAVENOUS
  Administered 2016-03-08: 10 mL/h via INTRAVENOUS

## 2016-03-08 MED ORDER — MIDAZOLAM HCL 2 MG/2ML IJ SOLN
INTRAMUSCULAR | Status: AC
  Filled 2016-03-08: qty 2

## 2016-03-08 MED ORDER — DEXAMETHASONE SODIUM PHOSPHATE 10 MG/ML IJ SOLN
INTRAMUSCULAR | Status: AC
  Filled 2016-03-08: qty 1

## 2016-03-08 MED ORDER — BUPIVACAINE HCL (PF) 0.25 % IJ SOLN
INTRAMUSCULAR | Status: DC | PRN
  Administered 2016-03-08: 10 mL

## 2016-03-08 MED ORDER — ONDANSETRON HCL 4 MG/2ML IJ SOLN
INTRAMUSCULAR | Status: DC | PRN
  Administered 2016-03-08: 4 mg via INTRAVENOUS

## 2016-03-08 MED ORDER — PROPOFOL 10 MG/ML IV BOLUS: INTRAVENOUS | Status: DC | PRN

## 2016-03-08 MED ORDER — FENTANYL CITRATE (PF) 100 MCG/2ML IJ SOLN
25.0000 ug | INTRAMUSCULAR | Status: DC | PRN
  Administered 2016-03-08 (×2): 50 ug via INTRAVENOUS

## 2016-03-08 MED ORDER — VANCOMYCIN HCL IN DEXTROSE 1-5 GM/200ML-% IV SOLN
INTRAVENOUS | Status: AC
  Filled 2016-03-08: qty 200

## 2016-03-08 MED ORDER — FENTANYL CITRATE (PF) 100 MCG/2ML IJ SOLN
50.0000 ug | INTRAMUSCULAR | Status: AC | PRN
  Administered 2016-03-08 (×2): 25 ug via INTRAVENOUS
  Administered 2016-03-08: 50 ug via INTRAVENOUS

## 2016-03-08 MED ORDER — CHLORHEXIDINE GLUCONATE 4 % EX LIQD: 60.0000 mL | Freq: Once | CUTANEOUS | Status: DC

## 2016-03-08 MED ORDER — LACTATED RINGERS IV SOLN: INTRAVENOUS | Status: DC

## 2016-03-08 MED ORDER — GLYCOPYRROLATE 0.2 MG/ML IJ SOLN: 0.2000 mg | Freq: Once | INTRAMUSCULAR | Status: DC | PRN

## 2016-03-08 MED ORDER — DEXAMETHASONE SODIUM PHOSPHATE 10 MG/ML IJ SOLN
INTRAMUSCULAR | Status: DC | PRN
  Administered 2016-03-08: 4 mg via INTRAVENOUS

## 2016-03-08 MED ORDER — HYDROCODONE-ACETAMINOPHEN 5-325 MG PO TABS: ORAL_TABLET | ORAL | 0 refills | Status: DC

## 2016-03-08 MED ORDER — LIDOCAINE 2% (20 MG/ML) 5 ML SYRINGE
INTRAMUSCULAR | Status: DC | PRN
  Administered 2016-03-08: 60 mg via INTRAVENOUS

## 2016-03-08 MED ORDER — LIDOCAINE 2% (20 MG/ML) 5 ML SYRINGE
INTRAMUSCULAR | Status: AC
  Filled 2016-03-08: qty 5

## 2016-03-08 MED ORDER — MEPERIDINE HCL 25 MG/ML IJ SOLN
6.2500 mg | INTRAMUSCULAR | Status: DC | PRN
Start: 2016-03-08 — End: 2016-03-08

## 2016-03-08 MED ORDER — PROPOFOL 10 MG/ML IV BOLUS
INTRAVENOUS | Status: DC | PRN
  Administered 2016-03-08: 120 mg via INTRAVENOUS

## 2016-03-08 MED ORDER — SCOPOLAMINE 1 MG/3DAYS TD PT72: 1.0000 | MEDICATED_PATCH | Freq: Once | TRANSDERMAL | Status: DC | PRN

## 2016-03-08 MED ORDER — ONDANSETRON HCL 4 MG/2ML IJ SOLN
INTRAMUSCULAR | Status: AC
  Filled 2016-03-08: qty 2

## 2016-03-08 MED ORDER — METOCLOPRAMIDE HCL 5 MG/ML IJ SOLN: 10.0000 mg | Freq: Once | INTRAMUSCULAR | Status: DC | PRN

## 2016-03-08 MED ORDER — HYDROCODONE-ACETAMINOPHEN 5-325 MG PO TABS
1.0000 | ORAL_TABLET | Freq: Once | ORAL | Status: AC | PRN
  Administered 2016-03-08: 1 via ORAL

## 2016-03-08 MED ORDER — VANCOMYCIN HCL IN DEXTROSE 1-5 GM/200ML-% IV SOLN
1000.0000 mg | INTRAVENOUS | Status: AC
  Administered 2016-03-08: 1000 mg via INTRAVENOUS

## 2016-03-08 MED ORDER — MIDAZOLAM HCL 2 MG/2ML IJ SOLN: 1.0000 mg | INTRAMUSCULAR | Status: DC | PRN

## 2016-03-08 SURGICAL SUPPLY — 36 items
BANDAGE ACE 3X5.8 VEL STRL LF (GAUZE/BANDAGES/DRESSINGS) ×3 IMPLANT
BANDAGE COBAN STERILE 2 (GAUZE/BANDAGES/DRESSINGS) ×3 IMPLANT
BLADE MINI RND TIP GREEN BEAV (BLADE) IMPLANT
BLADE SURG 15 STRL LF DISP TIS (BLADE) ×4 IMPLANT
BLADE SURG 15 STRL SS (BLADE) ×2
BNDG CONFORM 2 STRL LF (GAUZE/BANDAGES/DRESSINGS) ×3 IMPLANT
BNDG ESMARK 4X9 LF (GAUZE/BANDAGES/DRESSINGS) IMPLANT
BNDG GAUZE ELAST 4 BULKY (GAUZE/BANDAGES/DRESSINGS) ×3 IMPLANT
CHLORAPREP W/TINT 26ML (MISCELLANEOUS) ×3 IMPLANT
CORDS BIPOLAR (ELECTRODE) ×3 IMPLANT
COVER BACK TABLE 60X90IN (DRAPES) ×3 IMPLANT
COVER MAYO STAND STRL (DRAPES) ×3 IMPLANT
CUFF TOURNIQUET SINGLE 18IN (TOURNIQUET CUFF) ×3 IMPLANT
DRAPE EXTREMITY T 121X128X90 (DRAPE) ×3 IMPLANT
DRAPE SURG 17X23 STRL (DRAPES) ×3 IMPLANT
DRSG PAD ABDOMINAL 8X10 ST (GAUZE/BANDAGES/DRESSINGS) ×3 IMPLANT
GAUZE SPONGE 4X4 12PLY STRL (GAUZE/BANDAGES/DRESSINGS) ×3 IMPLANT
GAUZE XEROFORM 1X8 LF (GAUZE/BANDAGES/DRESSINGS) ×3 IMPLANT
GLOVE BIO SURGEON STRL SZ7.5 (GLOVE) ×6 IMPLANT
GLOVE BIOGEL PI IND STRL 8 (GLOVE) ×4 IMPLANT
GLOVE BIOGEL PI INDICATOR 8 (GLOVE) ×2
GOWN STRL REIN XL XLG (GOWN DISPOSABLE) ×3 IMPLANT
GOWN STRL REUS W/ TWL LRG LVL3 (GOWN DISPOSABLE) ×2 IMPLANT
GOWN STRL REUS W/TWL LRG LVL3 (GOWN DISPOSABLE) ×1
GOWN STRL REUS W/TWL XL LVL3 (GOWN DISPOSABLE) ×3 IMPLANT
NEEDLE HYPO 25X1 1.5 SAFETY (NEEDLE) IMPLANT
NS IRRIG 1000ML POUR BTL (IV SOLUTION) ×3 IMPLANT
PACK BASIN DAY SURGERY FS (CUSTOM PROCEDURE TRAY) ×3 IMPLANT
PADDING CAST ABS 4INX4YD NS (CAST SUPPLIES) ×1
PADDING CAST ABS COTTON 4X4 ST (CAST SUPPLIES) ×2 IMPLANT
STOCKINETTE 4X48 STRL (DRAPES) ×3 IMPLANT
SUT ETHILON 4 0 PS 2 18 (SUTURE) ×3 IMPLANT
SYR BULB 3OZ (MISCELLANEOUS) ×3 IMPLANT
SYR CONTROL 10ML LL (SYRINGE) IMPLANT
TOWEL OR 17X24 6PK STRL BLUE (TOWEL DISPOSABLE) ×6 IMPLANT
UNDERPAD 30X30 (UNDERPADS AND DIAPERS) ×3 IMPLANT

## 2016-03-08 NOTE — Anesthesia Procedure Notes (Signed)
Procedure Name: LMA Insertion Date/Time: 03/08/2016 9:47 AM Performed by: Curly ShoresRAFT, Trellis Vanoverbeke W Pre-anesthesia Checklist: Patient identified, Emergency Drugs available, Suction available and Patient being monitored Patient Re-evaluated:Patient Re-evaluated prior to inductionOxygen Delivery Method: Circle system utilized Preoxygenation: Pre-oxygenation with 100% oxygen Intubation Type: IV induction Ventilation: Mask ventilation without difficulty LMA: LMA inserted LMA Size: 3.0 Number of attempts: 1 Airway Equipment and Method: Bite block Placement Confirmation: positive ETCO2 and breath sounds checked- equal and bilateral Tube secured with: Tape Dental Injury: Teeth and Oropharynx as per pre-operative assessment

## 2016-03-08 NOTE — Discharge Instructions (Addendum)

## 2016-03-08 NOTE — Brief Op Note (Signed)
03/08/2016  10:25 AM  PATIENT:  Marissa Thompson  65 y.o. female  PRE-OPERATIVE DIAGNOSIS:  LEFT CARPAL TUNNEL SYNDROME,LEFT LONG AND RING FINGER TRIGGER DIGITAL  POST-OPERATIVE DIAGNOSIS:  ,LEFT LONG AND RING FINGER TRIGGER LEFT CARPAL TUNNEL SYNDROME  PROCEDURE:  Procedure(s): LEFT CARPAL TUNNEL RELEASE (Left) LEFT LONG TRIGGER RELEASE AND LEFT RING TRIGGER RELEASE (Left)  SURGEON:  Surgeon(s) and Role:    * Betha LoaKevin Emryn Flanery, MD - Primary  PHYSICIAN ASSISTANT:   ASSISTANTS: none   ANESTHESIA:   general  EBL:  Total I/O In: 800 [I.V.:800] Out: 5 [Blood:5]  BLOOD ADMINISTERED:none  DRAINS: none   LOCAL MEDICATIONS USED:  MARCAINE     SPECIMEN:  No Specimen  DISPOSITION OF SPECIMEN:  N/A  COUNTS:  YES  TOURNIQUET:   Total Tourniquet Time Documented: Upper Arm (Left) - 29 minutes Total: Upper Arm (Left) - 29 minutes   DICTATION: .Other Dictation: Dictation Number [id number]  PLAN OF CARE: Discharge to home after PACU  PATIENT DISPOSITION:  PACU - hemodynamically stable.

## 2016-03-08 NOTE — Anesthesia Postprocedure Evaluation (Signed)
Anesthesia Post Note  Patient: Marissa Thompson  Procedure(s) Performed: Procedure(s) (LRB): LEFT CARPAL TUNNEL RELEASE (Left) LEFT LONG TRIGGER RELEASE AND LEFT RING TRIGGER RELEASE (Left)  Patient location during evaluation: PACU Anesthesia Type: General Level of consciousness: awake and alert Pain management: pain level controlled Vital Signs Assessment: post-procedure vital signs reviewed and stable Respiratory status: spontaneous breathing, nonlabored ventilation, respiratory function stable and patient connected to nasal cannula oxygen Cardiovascular status: blood pressure returned to baseline and stable Postop Assessment: no signs of nausea or vomiting Anesthetic complications: no    Last Vitals:  Vitals:   03/08/16 1115 03/08/16 1145  BP: (!) 104/58 137/82  Pulse: 96 97  Resp: 14 18  Temp:  36.4 C    Last Pain:  Vitals:   03/08/16 1145  TempSrc:   PainSc: 4                  Phillips Groutarignan, Augustine Brannick

## 2016-03-08 NOTE — Op Note (Signed)
[id number] 

## 2016-03-08 NOTE — H&P (Signed)
Marissa Thompson is an 65 y.o. female.   Chief Complaint: left carpal tunnel, long and ring trigger digits HPI: 65 yo female with altered sensation in left hand with positive nerve conduction studies and left long and ring finger trigger digits.  These are bothersome to her.  She wishes to have them released.  Allergies:  Allergies  Allergen Reactions  . Baclofen Other (See Comments)    Memory Loss and shakes  . Morphine And Related Itching  . Talwin [Pentazocine] Itching  . Toradol [Ketorolac Tromethamine] Other (See Comments)    Slurred speech, confusion  . Penicillins Rash    Past Medical History:  Diagnosis Date  . Allergy   . Anxiety 2003  . Depression 1998  . Diabetes mellitus without complication (HCC) 1998  . GERD (gastroesophageal reflux disease) 1980  . HLD (hyperlipidemia)   . Hyperlipidemia 1990  . Hypothyroidism 1998  . PONV (postoperative nausea and vomiting)     Past Surgical History:  Procedure Laterality Date  . BACK SURGERY     x3 Lumbar  . CERVICAL POLYPECTOMY N/A 08/17/2013   Procedure: CERVICAL POLYPECTOMY;  Surgeon: Mickel Baasichard D Kaplan, MD;  Location: [facility];  Service: Gynecology;  Laterality: N/A;  . COLON SURGERY  2007   benign mass  . ECTOPIC PREGNANCY SURGERY    . ESOPHAGEAL DILATION     x3  . HYSTEROSCOPY W/D&C N/A 08/17/2013   Procedure: DILATATION AND CURETTAGE /HYSTEROSCOPY;  Surgeon: Mickel Baasichard D Kaplan, MD;  Location: [facility];  Service: Gynecology;  Laterality: N/A;  YAG LASER  . NECK SURGERY     c5-7 ACDF 3/15  . TONSILLECTOMY      Family History: Family History  Problem Relation Age of Onset  . Depression Mother   . Diabetes Mother   . Hypertension Mother   . Alcohol abuse Father   . Arthritis Father   . Cancer Father [phone]    Oral Cancer- had tongue, jaw resection--smoker and alcohol  . Alcohol abuse Brother     Social History:   reports that she has never smoked. She has never used smokeless tobacco. She reports that she does not  drink alcohol or use drugs.  Medications: Medications Prior to Admission  Medication Sig Dispense Refill  . aspirin EC 81 MG tablet Take 81 mg by mouth daily.    Marland Kitchen. buPROPion (WELLBUTRIN XL) 150 MG 24 hr tablet Take 1 tablet (150 mg total) by mouth daily. 30 tablet 3  . cyclobenzaprine (FLEXERIL) 10 MG tablet Take 1 tablet (10 mg total) by mouth 3 (three) times daily. 60 tablet 4  . gabapentin (NEURONTIN) 600 MG tablet Take 1 tablet in the am, 2 tablets at noon, 1 tablet at 5, 2 tablets at bedtime 180 tablet 5  . ibuprofen (ADVIL,MOTRIN) 800 MG tablet Take 800 mg by mouth 4 (four) times daily as needed.    . Insulin Glargine (TOUJEO SOLOSTAR) 300 UNIT/ML SOPN Inject 30 Units into the skin daily. 3 pen 5  . insulin lispro (HUMALOG) 100 UNIT/ML KiwkPen Inject 0.07 mLs (7 Units total) into the skin daily. At supper 15 mL 11  . levothyroxine (SYNTHROID, LEVOTHROID) 150 MCG tablet Take 1 tablet (150 mcg total) by mouth daily. 90 tablet 0  . metFORMIN (GLUCOPHAGE) 1000 MG tablet Take 1 tablet (1,000 mg total) by mouth 2 (two) times daily with a meal. 60 tablet 5  . pantoprazole (PROTONIX) 40 MG tablet Take 1 tablet (40 mg total) by mouth daily. 90 tablet 3  .  pravastatin (PRAVACHOL) 40 MG tablet Take 1 tablet (40 mg total) by mouth daily with supper. 90 tablet 1  . venlafaxine XR (EFFEXOR-XR) 75 MG 24 hr capsule Take 2 capsules (150 mg total) by mouth daily with breakfast. 60 capsule 5  . Insulin Pen Needle (CAREFINE PEN NEEDLES) 32G X 4 MM MISC Use as directed 100 each 11  . OVER THE COUNTER MEDICATION [facility] Patch Apply 1 Patch Daily As Needed For Pain    . PRECISION XTRA TEST STRIPS test strip Use to monitor FSBS 3x daily. Dx: E11.9. 100 each 11    Results for orders placed or performed during the hospital encounter of 03/08/16 (from the past 48 hour(s))  Glucose, capillary     Status: Abnormal   Collection Time: 03/08/16  8:38 AM  Result Value Ref Range   Glucose-Capillary 129 (H) 65 - 99  mg/dL    No results found.   A comprehensive review of systems was negative.  Blood pressure 126/69, pulse 84, temperature 98.7 F (37.1 C), temperature source Oral, resp. rate 20, height 4\' 11"  (1.499 m), weight 81.6 kg (180 lb), SpO2 95 %.  General appearance: alert, cooperative and appears stated age Head: Normocephalic, without obvious abnormality, atraumatic Neck: supple, symmetrical, trachea midline Resp: clear to auscultation bilaterally Cardio: regular rate and rhythm GI: non-tender Extremities: Intact sensation and capillary refill all digits.  +epl/fpl/io.  No wounds.  Pulses: 2+ and symmetric Skin: Skin color, texture, turgor normal. No rashes or lesions Neurologic: Grossly normal Incision/Wound:none  Assessment/Plan Left carpal tunnel syndrome and long and ring trigger digits.  Non operative and operative treatment options were discussed with the patient and patient wishes to proceed with operative treatment. Risks, benefits, and alternatives of surgery were discussed and the patient agrees with the plan of care.   Draya Felker R 03/08/2016, 9:24 AM

## 2016-03-08 NOTE — Anesthesia Preprocedure Evaluation (Signed)
Anesthesia Evaluation  Patient identified by MRN, date of birth, ID band Patient awake    Reviewed: Allergy & Precautions, NPO status , Patient's Chart, lab work & pertinent test results  History of Anesthesia Complications (+) PONV  Airway Mallampati: II  TM Distance: >3 FB Neck ROM: Full    Dental no notable dental hx.    Pulmonary neg pulmonary ROS,    Pulmonary exam normal breath sounds clear to auscultation       Cardiovascular negative cardio ROS Normal cardiovascular exam Rhythm:Regular Rate:Normal     Neuro/Psych negative neurological ROS  negative psych ROS   GI/Hepatic Neg liver ROS, GERD  Medicated and Controlled,  Endo/Other  diabetes, Type 2, Oral Hypoglycemic Agents, Insulin Dependent  Renal/GU negative Renal ROS  negative genitourinary   Musculoskeletal negative musculoskeletal ROS (+)   Abdominal   Peds negative pediatric ROS (+)  Hematology negative hematology ROS (+)   Anesthesia Other Findings   Reproductive/Obstetrics negative OB ROS                            Anesthesia Physical Anesthesia Plan  ASA: II  Anesthesia Plan: General   Post-op Pain Management:    Induction: Intravenous  Airway Management Planned: LMA  Additional Equipment:   Intra-op Plan:   Post-operative Plan: Extubation in OR  Informed Consent: I have reviewed the patients History and Physical, chart, labs and discussed the procedure including the risks, benefits and alternatives for the proposed anesthesia with the patient or authorized representative who has indicated his/her understanding and acceptance.   Dental advisory given  Plan Discussed with: CRNA  Anesthesia Plan Comments:         Anesthesia Quick Evaluation

## 2016-03-08 NOTE — Transfer of Care (Signed)
Immediate Anesthesia Transfer of Care Note  Patient: Michaela CornerJudith M Puthoff  Procedure(s) Performed: Procedure(s): LEFT CARPAL TUNNEL RELEASE (Left) LEFT LONG TRIGGER RELEASE AND LEFT RING TRIGGER RELEASE (Left)  Patient Location: PACU  Anesthesia Type:General  Level of Consciousness: awake, alert  and patient cooperative  Airway & Oxygen Therapy: Patient Spontanous Breathing and Patient connected to face mask oxygen  Post-op Assessment: Report given to RN, Post -op Vital signs reviewed and stable and Patient moving all extremities  Post vital signs: Reviewed and stable  Last Vitals:  Vitals:   03/08/16 0806  BP: 126/69  Pulse: 84  Resp: 20  Temp: 37.1 C    Last Pain:  Vitals:   03/08/16 0806  TempSrc: Oral  PainSc: 5          Complications: No apparent anesthesia complications

## 2016-03-09 ENCOUNTER — Encounter (HOSPITAL_BASED_OUTPATIENT_CLINIC_OR_DEPARTMENT_OTHER): Payer: Self-pay | Admitting: Orthopedic Surgery

## 2016-03-09 NOTE — Op Note (Signed)
NAMSalley Hews:  Kohlbeck, Rickell              ACCOUNT NO.:  [id number]  MEDICAL RECORD NO.:  [id number]  LOCATION:                                 FACILITY:  PHYSICIAN:  Betha LoaKevin Jamarius Saha, MD             DATE OF BIRTH:  DATE OF PROCEDURE:  03/08/2016 DATE OF DISCHARGE:                              OPERATIVE REPORT   PREOPERATIVE DIAGNOSIS:  Left carpal tunnel syndrome and long finger and ring finger trigger digits.  POSTOPERATIVE DIAGNOSIS:  Left carpal tunnel syndrome and long finger and ring finger trigger digits.  PROCEDURE:   1. Left carpal tunnel release 2. Left long finger trigger release 3. Left ring finger trigger release.  SURGEON:  Betha LoaKevin Lakesia Dahle, MD.  ASSISTANT:  None.  ANESTHESIA:  General.  IV FLUIDS:  Per anesthesia flow sheet.  ESTIMATED BLOOD LOSS:  Minimal.  COMPLICATIONS:  None.  SPECIMENS:  None.  TIME OF TOURNIQUET:  Time 29 minutes.  DISPOSITION:  Stable to PACU.  INDICATIONS:  Ms. Judie PetitMalcolm is a 65 year old female, who has had altered sensation in the hand, positive nerve conduction studies and triggering of the long and ring fingers.  She wished to have these released for management of symptoms.  Risks, benefits, and alternatives of surgery were discussed including risk of blood loss; infection; damage to nerves, vessels, tendons, ligaments, bone; failure of surgery; need for additional surgery; complications with wound healing; continued pain; recurrence of carpal tunnel syndrome and triggering; and damage to motor branch.  She voiced understanding of these risks and elected to proceed.  OPERATIVE COURSE:  After being identified preoperatively by myself, the patient and I agreed upon procedure and site of procedure.  Surgical site was marked.  The risks, benefits, and alternatives of surgery were reviewed and she wished to proceed.  Surgical consent was signed.  She was given IV vancomycin as preoperative antibiotic prophylaxis due to PENICILLIN allergy.   She was transferred to the operating room and placed on the operating table in supine position with left upper extremity on arm board.  General anesthesia induced by Anesthesiology. Left upper extremity was prepped and draped in normal sterile orthopedic fashion.  Surgical pause was performed between surgeons, anesthesia, and operating staff; and all are in agreement to the patient, procedure, and site of procedure.  Tourniquet at the proximal aspect of the extremity was inflated to 250 mmHg after exsanguination of the limb with Esmarch bandage.  The carpal tunnel was addressed first.  Incision was made over the transverse carpal ligament, carried into subcutaneous tissues by spreading technique.  Bipolar electrocautery was used to obtain hemostasis.  The palmar fascia was sharply incised.  Transverse carpal ligament was identified.  It was incised sharply.  It was incised distally first.  Care was taken to ensure complete decompression distally.  It was then incised proximally.  Scissors were used to split the distal aspect of the volar antebrachial fascia.  A finger was placed into the wound to ensure complete decompression, which was the case. The nerve was inspected, it was flattened and hyperemic.  The motor branch was identified and was intact.  Incision was made over the long and  ring fingers at the volar aspect of the MP joints.  Two separate incisions were made.  This was carried into subcutaneous tissues by spreading technique.  The A1 pulley was identified in the long finger. It was sharply incised.  The proximal 1-2 mm of the A2 pulley was vented to allow better excursion of the tendons.  The tendons were brought through the wound and separated to release any adhesions.  The finger was placed through range of motion, no triggering was noted.  The ring finger was addressed.  The subcutaneous tissues again entered by spreading technique.  The A1 pulley was identified and sharply  incised. There was an A0 pulley which was released as well.  The proximal 1-2 mm of the A2 pulley was vented to allow better excursion of the tendons. The tendons were brought through the wound and separated to release any adhesions.  The finger was placed through range of motion, no triggering was noted.  The wounds were all copiously irrigated with sterile saline and closed with 4-0 nylon in a horizontal mattress fashion.  They were then injected with 10 mL of 0.25% plain Marcaine to aid in postoperative analgesia.  They were dressed with sterile Xeroform, 4x4s, and ABD and wrapped with Kerlix and Ace bandage.  Tourniquet was deflated at 29 minutes.  Fingertips were pink with brisk capillary refill after deflation of tourniquet.  Operative drapes were broken down, and the patient was awoken from anesthesia safely.  She was transferred back to stretcher and taken to PACU in stable condition.  I will see her back in the office in 1 week for postoperative followup.  I will give her Norco 5/325, 1-2 p.o. q.6 hours p.r.n. pain, dispensed #20.     Betha Loa, MD     KK/MEDQ  D:  03/08/2016  T:  03/09/2016  Job:  [id number]

## 2016-03-13 ENCOUNTER — Other Ambulatory Visit: Payer: Self-pay | Admitting: Family Medicine

## 2016-03-28 ENCOUNTER — Emergency Department (HOSPITAL_COMMUNITY): Payer: Medicare Other

## 2016-03-28 ENCOUNTER — Encounter (HOSPITAL_COMMUNITY): Payer: Self-pay | Admitting: Emergency Medicine

## 2016-03-28 ENCOUNTER — Emergency Department (HOSPITAL_COMMUNITY)
Admission: EM | Admit: 2016-03-28 | Discharge: 2016-03-28 | Disposition: A | Discharge status: Home / Self Care | Payer: Medicare Other | Attending: Emergency Medicine | Admitting: Emergency Medicine

## 2016-03-28 DIAGNOSIS — E039 Hypothyroidism, unspecified: Secondary | ICD-10-CM | POA: Insufficient documentation

## 2016-03-28 DIAGNOSIS — E119 Type 2 diabetes mellitus without complications: Secondary | ICD-10-CM | POA: Diagnosis not present

## 2016-03-28 DIAGNOSIS — M542 Cervicalgia: Secondary | ICD-10-CM

## 2016-03-28 DIAGNOSIS — Z7982 Long term (current) use of aspirin: Secondary | ICD-10-CM | POA: Diagnosis not present

## 2016-03-28 DIAGNOSIS — M7918 Myalgia, other site: Secondary | ICD-10-CM

## 2016-03-28 DIAGNOSIS — S6992XA Unspecified injury of left wrist, hand and finger(s), initial encounter: Secondary | ICD-10-CM | POA: Diagnosis present

## 2016-03-28 DIAGNOSIS — Z7984 Long term (current) use of oral hypoglycemic drugs: Secondary | ICD-10-CM | POA: Insufficient documentation

## 2016-03-28 DIAGNOSIS — Y999 Unspecified external cause status: Secondary | ICD-10-CM | POA: Insufficient documentation

## 2016-03-28 DIAGNOSIS — Y939 Activity, unspecified: Secondary | ICD-10-CM | POA: Diagnosis not present

## 2016-03-28 DIAGNOSIS — Z794 Long term (current) use of insulin: Secondary | ICD-10-CM | POA: Insufficient documentation

## 2016-03-28 DIAGNOSIS — Y92410 Unspecified street and highway as the place of occurrence of the external cause: Secondary | ICD-10-CM | POA: Insufficient documentation

## 2016-03-28 DIAGNOSIS — S60222A Contusion of left hand, initial encounter: Secondary | ICD-10-CM | POA: Diagnosis not present

## 2016-03-28 MED ORDER — HYDROCODONE-ACETAMINOPHEN 5-325 MG PO TABS
1.0000 | ORAL_TABLET | Freq: Once | ORAL | Status: AC
  Administered 2016-03-28: 1 via ORAL
  Filled 2016-03-28: qty 1

## 2016-03-28 NOTE — ED Notes (Signed)
Patient transported to CT 
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
## 2016-03-29 ENCOUNTER — Ambulatory Visit: Payer: Medicare Other | Admitting: Physician Assistant

## 2016-04-02 ENCOUNTER — Ambulatory Visit: Payer: Medicare Other | Admitting: Physician Assistant

## 2016-04-05 ENCOUNTER — Encounter: Payer: Self-pay | Admitting: Family Medicine

## 2016-04-05 ENCOUNTER — Ambulatory Visit (INDEPENDENT_AMBULATORY_CARE_PROVIDER_SITE_OTHER): Payer: Medicare Other | Admitting: Family Medicine

## 2016-04-05 VITALS — BP 142/94 | HR 86 | Temp 98.3°F | Resp 18 | Ht 59.0 in | Wt 180.0 lb

## 2016-04-05 DIAGNOSIS — F418 Other specified anxiety disorders: Secondary | ICD-10-CM

## 2016-04-05 DIAGNOSIS — S134XXA Sprain of ligaments of cervical spine, initial encounter: Secondary | ICD-10-CM

## 2016-04-05 MED ORDER — CLONAZEPAM 1 MG PO TABS: 1.0000 mg | ORAL_TABLET | Freq: Two times a day (BID) | ORAL | 1 refills | Status: DC | PRN

## 2016-04-05 MED ORDER — CYCLOBENZAPRINE HCL 10 MG PO TABS: 10.0000 mg | ORAL_TABLET | Freq: Three times a day (TID) | ORAL | 0 refills | Status: DC | PRN
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
## 2016-04-19 ENCOUNTER — Ambulatory Visit: Payer: Medicare Other | Admitting: Family Medicine

## 2016-04-23 DIAGNOSIS — M19029 Primary osteoarthritis, unspecified elbow: Secondary | ICD-10-CM | POA: Insufficient documentation

## 2016-05-05 ENCOUNTER — Encounter (HOSPITAL_COMMUNITY): Payer: Self-pay | Admitting: Emergency Medicine

## 2016-05-05 ENCOUNTER — Emergency Department (HOSPITAL_COMMUNITY)
Admission: EM | Admit: 2016-05-05 | Discharge: 2016-05-05 | Disposition: A | Discharge status: Home / Self Care | Payer: Medicare Other | Attending: Emergency Medicine | Admitting: Emergency Medicine

## 2016-05-05 DIAGNOSIS — Z794 Long term (current) use of insulin: Secondary | ICD-10-CM | POA: Diagnosis not present

## 2016-05-05 DIAGNOSIS — M50121 Cervical disc disorder at C4-C5 level with radiculopathy: Secondary | ICD-10-CM | POA: Insufficient documentation

## 2016-05-05 DIAGNOSIS — E119 Type 2 diabetes mellitus without complications: Secondary | ICD-10-CM | POA: Insufficient documentation

## 2016-05-05 DIAGNOSIS — Z7982 Long term (current) use of aspirin: Secondary | ICD-10-CM | POA: Diagnosis not present

## 2016-05-05 DIAGNOSIS — M79602 Pain in left arm: Secondary | ICD-10-CM | POA: Diagnosis present

## 2016-05-05 DIAGNOSIS — E039 Hypothyroidism, unspecified: Secondary | ICD-10-CM | POA: Diagnosis not present

## 2016-05-05 DIAGNOSIS — Z7984 Long term (current) use of oral hypoglycemic drugs: Secondary | ICD-10-CM | POA: Diagnosis not present

## 2016-05-05 MED ORDER — OXYCODONE-ACETAMINOPHEN 5-325 MG PO TABS: 1.0000 | ORAL_TABLET | ORAL | 0 refills | Status: DC | PRN

## 2016-05-05 MED ORDER — DEXAMETHASONE SODIUM PHOSPHATE 10 MG/ML IJ SOLN
10.0000 mg | Freq: Once | INTRAMUSCULAR | Status: AC
  Administered 2016-05-05: 10 mg via INTRAMUSCULAR
  Filled 2016-05-05: qty 1

## 2016-05-05 MED ORDER — DIAZEPAM 5 MG PO TABS: 5.0000 mg | ORAL_TABLET | Freq: Two times a day (BID) | ORAL | 0 refills | Status: DC
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
## 2016-05-05 NOTE — Discharge Instructions (Signed)
F/u with your spine surgeon as scheduled on Tuesday the 24th.
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
## 2016-05-21 ENCOUNTER — Other Ambulatory Visit: Payer: Self-pay | Admitting: Family Medicine

## 2016-05-22 ENCOUNTER — Other Ambulatory Visit: Payer: Self-pay | Admitting: Orthopaedic Surgery

## 2016-05-22 DIAGNOSIS — M5000 Cervical disc disorder with myelopathy, unspecified cervical region: Secondary | ICD-10-CM

## 2016-05-22 NOTE — Telephone Encounter (Signed)
ok 

## 2016-05-22 NOTE — Telephone Encounter (Signed)
Ok to refill 

## 2016-05-27 ENCOUNTER — Ambulatory Visit
Admission: RE | Admit: 2016-05-27 | Discharge: 2016-05-27 | Disposition: A | Discharge status: Home / Self Care | Payer: Medicare Other | Source: Ambulatory Visit | Attending: Orthopaedic Surgery | Admitting: Orthopaedic Surgery

## 2016-05-27 DIAGNOSIS — M5000 Cervical disc disorder with myelopathy, unspecified cervical region: Secondary | ICD-10-CM

## 2016-06-13 ENCOUNTER — Other Ambulatory Visit: Payer: Self-pay | Admitting: Family Medicine

## 2016-06-13 NOTE — Telephone Encounter (Signed)
Medication refilled per protocol. 

## 2016-06-18 ENCOUNTER — Other Ambulatory Visit: Payer: Self-pay | Admitting: Family Medicine

## 2016-06-18 DIAGNOSIS — K219 Gastro-esophageal reflux disease without esophagitis: Secondary | ICD-10-CM

## 2016-06-19 ENCOUNTER — Ambulatory Visit (INDEPENDENT_AMBULATORY_CARE_PROVIDER_SITE_OTHER): Payer: Medicare Other

## 2016-06-19 DIAGNOSIS — Z111 Encounter for screening for respiratory tuberculosis: Secondary | ICD-10-CM | POA: Diagnosis not present

## 2016-06-20 ENCOUNTER — Other Ambulatory Visit: Payer: Self-pay | Admitting: Family Medicine

## 2016-06-21 ENCOUNTER — Ambulatory Visit: Payer: Medicare Other | Admitting: *Deleted

## 2016-06-21 DIAGNOSIS — Z111 Encounter for screening for respiratory tuberculosis: Secondary | ICD-10-CM

## 2016-06-21 LAB — TB SKIN TEST
Induration: 0 mm
TB SKIN TEST: NEGATIVE

## 2016-06-21 NOTE — Progress Notes (Signed)
Patient ID: Marissa CornerJudith M Shinault, female   DOB: 02/15/1951, 65 y.o.   MRN: [id number]  Patient seen in office to have PPD read.   No redness or induration noted.   Read as negative.

## 2016-07-05 ENCOUNTER — Encounter: Payer: Self-pay | Admitting: Family Medicine

## 2016-07-05 ENCOUNTER — Telehealth: Payer: Self-pay | Admitting: Family Medicine

## 2016-07-05 ENCOUNTER — Ambulatory Visit (INDEPENDENT_AMBULATORY_CARE_PROVIDER_SITE_OTHER): Payer: Medicare Other | Admitting: Family Medicine

## 2016-07-05 VITALS — BP 100/68 | HR 94 | Temp 98.6°F | Resp 18 | Ht 59.0 in | Wt 178.0 lb

## 2016-07-05 DIAGNOSIS — J069 Acute upper respiratory infection, unspecified: Secondary | ICD-10-CM

## 2016-07-05 MED ORDER — AZITHROMYCIN 250 MG PO TABS: ORAL_TABLET | ORAL | 0 refills | Status: DC

## 2016-07-05 NOTE — Progress Notes (Signed)
Subjective:    Patient ID: Marissa CornerJudith M Thompson, female    DOB: 04/08/1951, 65 y.o.   MRN: [id number]  HPI Patient is a very pleasant 65 year old white female with a history of insulin-dependent diabetes mellitus. 2 days ago she developed head congestion, rhinorrhea, watery right eye, sore throat, and nonproductive cough. She denies any fevers or chills however she does have a history of severe bronchitis in the past and she's concerned about developing this over Christmas Past Medical History:  Diagnosis Date  . Allergy   . Anxiety 2003  . Depression 1998  . Diabetes mellitus without complication (HCC) 1998  . GERD (gastroesophageal reflux disease) 1980  . HLD (hyperlipidemia)   . Hyperlipidemia 1990  . Hypothyroidism 1998  . PONV (postoperative nausea and vomiting)    Past Surgical History:  Procedure Laterality Date  . BACK SURGERY     x3 Lumbar  . CARPAL TUNNEL RELEASE Left 03/08/2016   Procedure: LEFT CARPAL TUNNEL RELEASE;  Surgeon: Betha LoaKevin Kuzma, MD;  Location: [facility];  Service: Orthopedics;  Laterality: Left;  . CERVICAL POLYPECTOMY N/A 08/17/2013   Procedure: CERVICAL POLYPECTOMY;  Surgeon: Mickel Baasichard D Kaplan, MD;  Location: [facility];  Service: Gynecology;  Laterality: N/A;  . COLON SURGERY  2007   benign mass  . ECTOPIC PREGNANCY SURGERY    . ESOPHAGEAL DILATION     x3  . HYSTEROSCOPY W/D&C N/A 08/17/2013   Procedure: DILATATION AND CURETTAGE /HYSTEROSCOPY;  Surgeon: Mickel Baasichard D Kaplan, MD;  Location: [facility];  Service: Gynecology;  Laterality: N/A;  YAG LASER  . NECK SURGERY     c5-7 ACDF 3/15  . TONSILLECTOMY    . TRIGGER FINGER RELEASE Left 03/08/2016   Procedure: LEFT LONG TRIGGER RELEASE AND LEFT RING TRIGGER RELEASE;  Surgeon: Betha LoaKevin Kuzma, MD;  Location: [facility];  Service: Orthopedics;  Laterality: Left;   Current Outpatient Prescriptions on File Prior to Visit  Medication Sig Dispense Refill  . aspirin EC 81 MG tablet Take 81 mg by mouth  daily.    . cyclobenzaprine (FLEXERIL) 10 MG tablet Take 1 tablet (10 mg total) by mouth 3 (three) times daily. 60 tablet 4  . diazepam (VALIUM) 5 MG tablet Take 1 tablet (5 mg total) by mouth 2 (two) times daily. 10 tablet 0  . gabapentin (NEURONTIN) 600 MG tablet take 2 tablets by mouth three times a day 180 tablet 2  . HYDROcodone-acetaminophen (NORCO) 5-325 MG tablet 1-2 tabs po q6 hours prn pain 20 tablet 0  . ibuprofen (ADVIL,MOTRIN) 800 MG tablet Take 800 mg by mouth 4 (four) times daily as needed.    . insulin lispro (HUMALOG) 100 UNIT/ML KiwkPen Inject 0.07 mLs (7 Units total) into the skin daily. At supper 15 mL 11  . Insulin Pen Needle (CAREFINE PEN NEEDLES) 32G X 4 MM MISC Use as directed 100 each 11  . levothyroxine (SYNTHROID, LEVOTHROID) 150 MCG tablet Take 1 tablet (150 mcg total) by mouth daily. 90 tablet 0  . metFORMIN (GLUCOPHAGE) 1000 MG tablet take 1 tablet by mouth twice a day with meals 60 tablet 5  . OVER THE COUNTER MEDICATION [facility] Patch Apply 1 Patch Daily As Needed For Pain    . oxyCODONE-acetaminophen (PERCOCET/ROXICET) 5-325 MG tablet Take 1 tablet by mouth every 4 (four) hours as needed for severe pain. 10 tablet 0  . pantoprazole (PROTONIX) 40 MG tablet take 1 tablet by mouth once daily 90 tablet 3  . pravastatin (PRAVACHOL) 40  MG tablet Take 1 tablet (40 mg total) by mouth daily with supper. 90 tablet 1  . PRECISION XTRA TEST STRIPS test strip Use to monitor FSBS 3x daily. Dx: E11.9. 100 each 11  . TOUJEO SOLOSTAR 300 UNIT/ML SOPN inject 30 units INTO THE SKIN once daily 13.5 mL 5  . venlafaxine XR (EFFEXOR-XR) 75 MG 24 hr capsule Take 2 capsules (150 mg total) by mouth daily with breakfast. 60 capsule 5   No current facility-administered medications on file prior to visit.    Allergies  Allergen Reactions  . Baclofen Other (See Comments)    Memory Loss and shakes  . Morphine And Related Itching  . Talwin [Pentazocine] Itching  . Toradol [Ketorolac  Tromethamine] Other (See Comments)    Slurred speech, confusion  . Penicillins Rash   Social History   Social History  . Marital status: Legally Separated    Spouse name: N/A  . Number of children: 1  . Years of education: N/A   Occupational History  . Nurse    Social History Main Topics  . Smoking status: Never Smoker  . Smokeless tobacco: Never Used  . Alcohol use No  . Drug use: No  . Sexual activity: Not Currently   Other Topics Concern  . Not on file   Social History Narrative   Separated. Son lives with her.    On Disability--secondary to back. On disability since 2012.   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    Never smoked.       Review of Systems  All other systems reviewed and are negative.      Objective:   Physical Exam  Constitutional: She appears well-developed and well-nourished.  HENT:  Right Ear: Tympanic membrane, external ear and ear canal normal.  Left Ear: Tympanic membrane, external ear and ear canal normal.  Nose: Mucosal edema and rhinorrhea present. Right sinus exhibits no maxillary sinus tenderness and no frontal sinus tenderness. Left sinus exhibits no maxillary sinus tenderness and no frontal sinus tenderness.  Mouth/Throat: Oropharynx is clear and moist. No oropharyngeal exudate.  Neck: Neck supple.  Cardiovascular: Normal rate, regular rhythm and normal heart sounds.   Pulmonary/Chest: Effort normal and breath sounds normal. No respiratory distress. She has no wheezes. She has no rales.  Lymphadenopathy:    She has no cervical adenopathy.  Vitals reviewed.         Assessment & Plan:  Acute URI - Plan: azithromycin (ZITHROMAX) 250 MG tablet  Patient symptoms are consistent with viral upper respiratory infection. I recommended tincture of time. She can use Sudafed as needed for head congestion and rhinorrhea. She can use Mucinex  DM as needed for cough and chest congestion. Given her history of severe bronchitis and the fact that his Christmas weekend, I did give the patient prescription for a Z-Pak. 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 Therefore I asked her to be a good steward of antibiotic and only get the prescription should she develop an extremely high fever, cough productive of purulent sputum, severe sinus pain, purulent sinus drainage etc. Otherwise treat symptomatically as a virus

## 2016-07-11 ENCOUNTER — Ambulatory Visit: Payer: Medicare Other | Admitting: Family Medicine

## 2016-07-15 ENCOUNTER — Other Ambulatory Visit: Payer: Self-pay | Admitting: Family Medicine

## 2016-07-17 NOTE — Telephone Encounter (Signed)
Ok to refill 

## 2016-07-17 NOTE — Telephone Encounter (Signed)
ok 

## 2016-07-19 ENCOUNTER — Other Ambulatory Visit: Payer: Self-pay | Admitting: Family Medicine

## 2016-07-19 NOTE — Telephone Encounter (Signed)
ok 

## 2016-07-19 NOTE — Telephone Encounter (Signed)
Prescription sent to pharmacy.

## 2016-07-19 NOTE — Telephone Encounter (Signed)
Ok to refill 

## 2016-08-09 ENCOUNTER — Other Ambulatory Visit: Payer: Self-pay | Admitting: Family Medicine

## 2016-08-13 ENCOUNTER — Ambulatory Visit: Payer: Medicare Other | Admitting: Family Medicine

## 2016-08-17 ENCOUNTER — Other Ambulatory Visit: Payer: Self-pay | Admitting: Family Medicine

## 2016-08-29 DIAGNOSIS — G5601 Carpal tunnel syndrome, right upper limb: Secondary | ICD-10-CM | POA: Insufficient documentation

## 2016-08-31 ENCOUNTER — Other Ambulatory Visit: Payer: Self-pay | Admitting: Orthopedic Surgery

## 2016-08-31 DIAGNOSIS — R52 Pain, unspecified: Secondary | ICD-10-CM

## 2016-09-11 ENCOUNTER — Other Ambulatory Visit: Payer: Self-pay | Admitting: Family Medicine

## 2016-09-13 ENCOUNTER — Other Ambulatory Visit: Payer: Self-pay | Admitting: Family Medicine

## 2016-09-13 DIAGNOSIS — F329 Major depressive disorder, single episode, unspecified: Secondary | ICD-10-CM

## 2016-09-13 DIAGNOSIS — F32A Depression, unspecified: Secondary | ICD-10-CM

## 2016-09-13 NOTE — Telephone Encounter (Signed)
ok 

## 2016-09-13 NOTE — Telephone Encounter (Signed)
Ok to refill Flexeril? 

## 2016-10-05 ENCOUNTER — Ambulatory Visit: Payer: Medicare Other | Admitting: Gastroenterology

## 2016-10-22 ENCOUNTER — Ambulatory Visit (INDEPENDENT_AMBULATORY_CARE_PROVIDER_SITE_OTHER): Payer: Medicare Other | Admitting: Family Medicine

## 2016-10-22 ENCOUNTER — Encounter: Payer: Self-pay | Admitting: Family Medicine

## 2016-10-22 ENCOUNTER — Other Ambulatory Visit: Payer: Self-pay | Admitting: Family Medicine

## 2016-10-22 VITALS — BP 142/80 | HR 88 | Temp 98.0°F | Resp 18 | Wt 184.0 lb

## 2016-10-22 DIAGNOSIS — J301 Allergic rhinitis due to pollen: Secondary | ICD-10-CM | POA: Diagnosis not present

## 2016-10-22 MED ORDER — INSULIN LISPRO 100 UNIT/ML (KWIKPEN): 7.0000 [IU] | PEN_INJECTOR | Freq: Every day | SUBCUTANEOUS | 11 refills | Status: DC

## 2016-10-22 MED ORDER — AZITHROMYCIN 250 MG PO TABS: ORAL_TABLET | ORAL | 0 refills | Status: DC

## 2016-10-22 NOTE — Progress Notes (Signed)
Subjective:    Patient ID: Marissa Thompson, female    DOB: 07-12-1951, 66 y.o.   MRN: [id number]  HPI Symptoms began Saturday. Patient reports rhinorrhea, postnasal drip, pain and pressure in her maxillary and frontal sinuses. She denies any fever. She denies any purulent nasal discharge. She does report postnasal drip and a nonproductive cough with some mild sore throat. She also reports itchy watery eyes and head congestion Past Medical History:  Diagnosis Date  . Allergy   . Anxiety 2003  . Depression 1998  . Diabetes mellitus without complication (HCC) 1998  . GERD (gastroesophageal reflux disease) 1980  . HLD (hyperlipidemia)   . Hyperlipidemia 1990  . Hypothyroidism 1998  . PONV (postoperative nausea and vomiting)    Past Surgical History:  Procedure Laterality Date  . BACK SURGERY     x3 Lumbar  . CARPAL TUNNEL RELEASE Left 03/08/2016   Procedure: LEFT CARPAL TUNNEL RELEASE;  Surgeon: Betha Loa, MD;  Location: [facility];  Service: Orthopedics;  Laterality: Left;  . CERVICAL POLYPECTOMY N/A 08/17/2013   Procedure: CERVICAL POLYPECTOMY;  Surgeon: Mickel Baas, MD;  Location: [facility];  Service: Gynecology;  Laterality: N/A;  . COLON SURGERY  2007   benign mass  . ECTOPIC PREGNANCY SURGERY    . ESOPHAGEAL DILATION     x3  . HYSTEROSCOPY W/D&C N/A 08/17/2013   Procedure: DILATATION AND CURETTAGE /HYSTEROSCOPY;  Surgeon: Mickel Baas, MD;  Location: [facility];  Service: Gynecology;  Laterality: N/A;  YAG LASER  . NECK SURGERY     c5-7 ACDF 3/15  . TONSILLECTOMY    . TRIGGER FINGER RELEASE Left 03/08/2016   Procedure: LEFT LONG TRIGGER RELEASE AND LEFT RING TRIGGER RELEASE;  Surgeon: Betha Loa, MD;  Location: [facility];  Service: Orthopedics;  Laterality: Left;   Current Outpatient Prescriptions on File Prior to Visit  Medication Sig Dispense Refill  . aspirin EC 81 MG tablet Take 81 mg by mouth daily.    . cyclobenzaprine (FLEXERIL) 10  MG tablet TAKE 1 TABLET BY MOUTH 3 TIMES DAILY AS NEEDED FOR MUSCLE SPASMS 90 tablet 1  . gabapentin (NEURONTIN) 600 MG tablet TAKE 2 TABLETS BY MOUTH 3 TIMES A DAY 180 tablet 2  . Insulin Pen Needle (CAREFINE PEN NEEDLES) 32G X 4 MM MISC Use as directed 100 each 11  . levothyroxine (SYNTHROID, LEVOTHROID) 150 MCG tablet take 1 tablet by mouth once daily 90 tablet 0  . metFORMIN (GLUCOPHAGE) 1000 MG tablet take 1 tablet by mouth twice a day with meals 60 tablet 5  . OVER THE COUNTER MEDICATION [place] Patch Apply 1 Patch Daily As Needed For Pain    . pantoprazole (PROTONIX) 40 MG tablet take 1 tablet by mouth once daily 90 tablet 3  . pravastatin (PRAVACHOL) 40 MG tablet TAKE 1 TABLET BY MOUTH DAILY WITH SUPPER 90 tablet 1  . PRECISION XTRA TEST STRIPS test strip Use to monitor FSBS 3x daily. Dx: E11.9. 100 each 11  . TOUJEO SOLOSTAR 300 UNIT/ML SOPN inject 30 units INTO THE SKIN once daily 13.5 mL 5  . venlafaxine XR (EFFEXOR-XR) 75 MG 24 hr capsule TAKE 2 CAPSULES BY MOUTH DAILY WITH BREAKFAST 60 capsule 5  . diazepam (VALIUM) 5 MG tablet Take 1 tablet (5 mg total) by mouth 2 (two) times daily. (Patient not taking: Reported on 10/22/2016) 10 tablet 0  . HYDROcodone-acetaminophen (NORCO) 5-325 MG tablet 1-2 tabs po q6 hours prn pain (Patient  not taking: Reported on 10/22/2016) 20 tablet 0  . ibuprofen (ADVIL,MOTRIN) 800 MG tablet Take 800 mg by mouth 4 (four) times daily as needed.    Marland Kitchen oxyCODONE-acetaminophen (PERCOCET/ROXICET) 5-325 MG tablet Take 1 tablet by mouth every 4 (four) hours as needed for severe pain. (Patient not taking: Reported on 10/22/2016) 10 tablet 0   No current facility-administered medications on file prior to visit.    Allergies  Allergen Reactions  . Baclofen Other (See Comments)    Memory Loss and shakes  . Morphine And Related Itching  . Talwin [Pentazocine] Itching  . Toradol [Ketorolac Tromethamine] Other (See Comments)    Slurred speech, confusion  . Penicillins  Rash   Social History   Social History  . Marital status: Legally Separated    Spouse name: N/A  . Number of children: 1  . Years of education: N/A   Occupational History  . Nurse    Social History Main Topics  . Smoking status: Never Smoker  . Smokeless tobacco: Never Used  . Alcohol use No  . Drug use: No  . Sexual activity: Not Currently   Other Topics Concern  . Not on file   Social History Narrative   Separated. Son lives with her.    On Disability--secondary to back. On disability since 2012.   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    Never smoked.       Review of Systems  All other systems reviewed and are negative.      Objective:   Physical Exam  Constitutional: She appears well-developed and well-nourished. No distress.  HENT:  Head: Normocephalic and atraumatic.  Right Ear: External ear normal.  Left Ear: External ear normal.  Nose: Mucosal edema and rhinorrhea present. Right sinus exhibits maxillary sinus tenderness and frontal sinus tenderness. Left sinus exhibits maxillary sinus tenderness and frontal sinus tenderness.  Mouth/Throat: Oropharynx is clear and moist. No oropharyngeal exudate.  Eyes: Conjunctivae are normal. Pupils are equal, round, and reactive to light.  Neck: Neck supple.  Cardiovascular: Normal rate, regular rhythm and normal heart sounds.   Pulmonary/Chest: Effort normal and breath sounds normal. No respiratory distress. She has no wheezes. She has no rales.  Abdominal: Soft. Bowel sounds are normal.  Lymphadenopathy:    She has no cervical adenopathy.  Skin: She is not diaphoretic.  Vitals reviewed.         Assessment & Plan:  Acute seasonal allergic rhinitis due to pollen  The patient has rhinosinusitis but I believe is due to allergies. Begin Flonase 2 sprays each nostril daily and Zyrtec 10 mg by mouth daily. Allow one  week and I believe symptoms will improve. If symptoms worsen or she develops a fever she can start using a Z-Pak but I cautioned the patient against overuse of antibiotics due to antibiotic resistance

## 2016-11-08 ENCOUNTER — Ambulatory Visit (INDEPENDENT_AMBULATORY_CARE_PROVIDER_SITE_OTHER): Payer: Medicare Other | Admitting: Gastroenterology

## 2016-11-08 ENCOUNTER — Encounter: Payer: Self-pay | Admitting: Gastroenterology

## 2016-11-08 VITALS — BP 124/68 | HR 100 | Ht 58.17 in | Wt 179.5 lb

## 2016-11-08 DIAGNOSIS — Z85038 Personal history of other malignant neoplasm of large intestine: Secondary | ICD-10-CM | POA: Diagnosis not present

## 2016-11-08 DIAGNOSIS — R1310 Dysphagia, unspecified: Secondary | ICD-10-CM

## 2016-11-08 MED ORDER — NA SULFATE-K SULFATE-MG SULF 17.5-3.13-1.6 GM/177ML PO SOLN: 1.0000 | Freq: Once | ORAL | 0 refills | Status: AC

## 2016-11-08 NOTE — Progress Notes (Signed)
Marissa Thompson    [id number]    08-21-1950  Primary Care Physician:PICKARD,WARREN TOM, MD  Referring Physician: Donita Brooks, MD [address] [place], [place] [address]  Chief complaint:  Dysphagia  HPI: 66 year old female previous followed by Dr. Arlyce Dice, last seen in office by Lawson Fiscal Hvozdovic here for follow-up visit with complaints of dysphagia. She has history of esophageal stricture that required dilation in the past. Most recent EGD with balloon dilation August 2016, distal esophageal stricture was dilated to 18 millimeter. She had improvement of dysphagia post-dilation but in the past few months she feels her symptoms are recurring and she is having trouble swallowing mostly with toast or sandwich. She doesn't have lower dentures and does not eat meat as she cannot chew properly. She has history of colon cancer that was resected in 2007. Last colonoscopy in 2013 at [facility] gastroenterology Denies any nausea, vomiting, abdominal pain, melena or bright red blood per rectum   Outpatient Encounter Prescriptions as of 11/08/2016  Medication Sig  . aspirin EC 81 MG tablet Take 81 mg by mouth daily.  . cyclobenzaprine (FLEXERIL) 10 MG tablet TAKE 1 TABLET BY MOUTH 3 TIMES DAILY AS NEEDED FOR MUSCLE SPASMS  . gabapentin (NEURONTIN) 600 MG tablet TAKE 2 TABLETS BY MOUTH 3 TIMES A DAY  . ibuprofen (ADVIL,MOTRIN) 800 MG tablet Take 800 mg by mouth 4 (four) times daily as needed.  . insulin lispro (HUMALOG) 100 UNIT/ML KiwkPen Inject 0.07 mLs (7 Units total) into the skin daily. At supper  . Insulin Pen Needle (CAREFINE PEN NEEDLES) 32G X 4 MM MISC Use as directed  . levothyroxine (SYNTHROID, LEVOTHROID) 150 MCG tablet take 1 tablet by mouth once daily  . metFORMIN (GLUCOPHAGE) 1000 MG tablet take 1 tablet by mouth twice a day with meals  . OVER THE COUNTER MEDICATION [place] Pass Patch Apply 1 Patch Daily As Needed For Pain  . pantoprazole (PROTONIX) 40 MG tablet take 1  tablet by mouth once daily  . pravastatin (PRAVACHOL) 40 MG tablet TAKE 1 TABLET BY MOUTH DAILY WITH SUPPER  . PRECISION XTRA TEST STRIPS test strip Use to monitor FSBS 3x daily. Dx: E11.9.  . TOUJEO SOLOSTAR 300 UNIT/ML SOPN inject 30 units INTO THE SKIN once daily  . venlafaxine XR (EFFEXOR-XR) 75 MG 24 hr capsule TAKE 2 CAPSULES BY MOUTH DAILY WITH BREAKFAST  . [DISCONTINUED] azithromycin (ZITHROMAX) 250 MG tablet 2 tabs poqday1, 1 tab poqday 2-5  . [DISCONTINUED] diazepam (VALIUM) 5 MG tablet Take 1 tablet (5 mg total) by mouth 2 (two) times daily. (Patient not taking: Reported on 10/22/2016)  . [DISCONTINUED] HYDROcodone-acetaminophen (NORCO) 5-325 MG tablet 1-2 tabs po q6 hours prn pain (Patient not taking: Reported on 10/22/2016)  . [DISCONTINUED] oxyCODONE-acetaminophen (PERCOCET/ROXICET) 5-325 MG tablet Take 1 tablet by mouth every 4 (four) hours as needed for severe pain. (Patient not taking: Reported on 10/22/2016)   No facility-administered encounter medications on file as of 11/08/2016.     Allergies as of 11/08/2016 - Review Complete 11/08/2016  Allergen Reaction Noted  . Baclofen Other (See Comments) 12/28/2014  . Morphine and related Itching 08/03/2013  . Talwin [pentazocine] Itching 08/03/2013  . Toradol [ketorolac tromethamine] Other (See Comments) 09/18/2013  . Penicillins Rash 08/03/2013    Past Medical History:  Diagnosis Date  . Allergy   . Anxiety 2003  . Depression 1998  . Diabetes mellitus without complication (HCC) 1998  . GERD (gastroesophageal  reflux disease) 1980  . HLD (hyperlipidemia)   . Hyperlipidemia 1990  . Hypothyroidism 1998  . PONV (postoperative nausea and vomiting)     Past Surgical History:  Procedure Laterality Date  . BACK SURGERY     x3 Lumbar  . CARPAL TUNNEL RELEASE Left 03/08/2016   Procedure: LEFT CARPAL TUNNEL RELEASE;  Surgeon: Betha Loa, MD;  Location: [facility];  Service: Orthopedics;  Laterality: Left;  . CERVICAL  POLYPECTOMY N/A 08/17/2013   Procedure: CERVICAL POLYPECTOMY;  Surgeon: Mickel Baas, MD;  Location: [facility];  Service: Gynecology;  Laterality: N/A;  . COLON SURGERY  2007   benign mass  . ECTOPIC PREGNANCY SURGERY    . ESOPHAGEAL DILATION     x3  . HYSTEROSCOPY W/D&C N/A 08/17/2013   Procedure: DILATATION AND CURETTAGE /HYSTEROSCOPY;  Surgeon: Mickel Baas, MD;  Location: [facility];  Service: Gynecology;  Laterality: N/A;  YAG LASER  . NECK SURGERY     c5-7 ACDF 3/15  . TONSILLECTOMY    . TRIGGER FINGER RELEASE Left 03/08/2016   Procedure: LEFT LONG TRIGGER RELEASE AND LEFT RING TRIGGER RELEASE;  Surgeon: Betha Loa, MD;  Location: [facility];  Service: Orthopedics;  Laterality: Left;    Family History  Problem Relation Age of Onset  . Depression Mother   . Diabetes Mother   . Hypertension Mother   . Alcohol abuse Father   . Arthritis Father   . Cancer Father 72    Oral Cancer- had tongue, jaw resection--smoker and alcohol  . Alcohol abuse Brother     Social History   Social History  . Marital status: Legally Separated    Spouse name: N/A  . Number of children: 1  . Years of education: N/A   Occupational History  . Nurse    Social History Main Topics  . Smoking status: Never Smoker  . Smokeless tobacco: Never Used  . Alcohol use No  . Drug use: No  . Sexual activity: Not Currently   Other Topics Concern  . Not on file   Social History Narrative   Separated. Son lives with her.    On Disability--secondary to back. On disability since 2012.   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    Never smoked.       Review of systems: Review of Systems  Constitutional: Negative for fever and chills.  HENT: Negative.   Eyes: Negative for blurred vision.  Respiratory: Negative for cough, shortness of breath and wheezing.   Cardiovascular: Negative for  chest pain and palpitations.  Gastrointestinal: as per HPI Genitourinary: Negative for dysuria, urgency, frequency and hematuria.  Musculoskeletal: Negative for myalgias, back pain and joint pain.  Skin: Negative for itching and rash.  Neurological: Negative for dizziness, tremors, focal weakness, seizures and loss of consciousness.  Endo/Heme/Allergies: Positive for seasonal allergies.  Psychiatric/Behavioral: Negative for depression, suicidal ideas and hallucinations.  All other systems reviewed and are negative.   Physical Exam: Vitals:   11/08/16 0953  BP: 124/68  Pulse: 100   Body mass index is 37.3 kg/m. Gen:      No acute distress HEENT:  EOMI, sclera anicteric, no lower denture Neck:     No masses; no thyromegaly Lungs:    Clear to auscultation bilaterally; normal respiratory effort CV:         Regular rate and rhythm; no  murmurs Abd:      + bowel sounds; soft, non-tender; no palpable masses, no distension Ext:    No edema; adequate peripheral perfusion Skin:      Warm and dry; no rash Neuro: alert and oriented x 3 Psych: normal mood and affect  Data Reviewed:  Reviewed labs, radiology imaging, old records and pertinent past GI work up   Assessment and Plan/Recommendations:  66 year old female with history of colon cancer status post resection in 2007, distal esophageal stricture status post dilation to 18 mm in 2016 here with complaints of recurrent solid dysphagia  We'll schedule for EGD for evaluation and possible repeat dilation Patient does not have lower dentures and that may be playing a role in worsening her symptoms She is planning to get dental implants Advised patient to continue to avoid meat, bread and raw vegetables until she undergoes EGD and esophageal dilation to prevent food impaction Continue pantoprazole 40 mg daily Antireflux measures  Last colonoscopy in 2013, due for surveillance colonoscopy will schedule it along with EGD The risks and  benefits as well as alternatives of endoscopic procedure(s) have been discussed and reviewed. All questions answered. The patient agrees to proceed.   Iona Beard , MD [phone] Mon-Fri 8a-5p [phone] after 5p, weekends, holidays  CC: Donita Brooks, MD

## 2016-11-08 NOTE — Patient Instructions (Signed)

## 2016-11-13 ENCOUNTER — Telehealth: Payer: Self-pay | Admitting: Gastroenterology

## 2016-11-14 NOTE — Telephone Encounter (Signed)
Returned [employer], insurance will not cover Suprep , Gave coupon information to pharmacy- Pts cost only $50

## 2016-11-19 ENCOUNTER — Telehealth: Payer: Self-pay | Admitting: Gastroenterology

## 2016-11-19 MED ORDER — NA SULFATE-K SULFATE-MG SULF 17.5-3.13-1.6 GM/177ML PO SOLN: 1.0000 | Freq: Once | ORAL | 0 refills | Status: AC

## 2016-11-19 NOTE — Telephone Encounter (Signed)
Called patient to inform prep sent to pharmacy

## 2016-11-20 ENCOUNTER — Telehealth: Payer: Self-pay | Admitting: Gastroenterology

## 2016-11-20 NOTE — Telephone Encounter (Signed)
Judeth CornfieldStephanie, Charge pt cancel fee.... We had called and given pharmacy a coupon and the pt only had to pay 50$ and was aware on May 1st.  Its now May 8th

## 2016-11-20 NOTE — Telephone Encounter (Signed)
Ok to charge. Please do Miralax and gatorade prep if insurance doesn't cover prep and we have no samples

## 2016-11-21 ENCOUNTER — Encounter: Payer: Medicare Other | Admitting: Gastroenterology

## 2016-11-23 ENCOUNTER — Other Ambulatory Visit: Payer: Self-pay | Admitting: Family Medicine

## 2016-12-03 NOTE — Telephone Encounter (Signed)
PT BILLED. °

## 2016-12-13 ENCOUNTER — Encounter: Payer: Self-pay | Admitting: Family Medicine

## 2016-12-13 ENCOUNTER — Ambulatory Visit (INDEPENDENT_AMBULATORY_CARE_PROVIDER_SITE_OTHER): Payer: Medicare Other | Admitting: Family Medicine

## 2016-12-13 VITALS — BP 144/80 | HR 82 | Temp 98.5°F | Resp 20 | Ht 59.0 in | Wt 183.0 lb

## 2016-12-13 DIAGNOSIS — E1165 Type 2 diabetes mellitus with hyperglycemia: Secondary | ICD-10-CM | POA: Diagnosis not present

## 2016-12-13 DIAGNOSIS — I10 Essential (primary) hypertension: Secondary | ICD-10-CM | POA: Diagnosis not present

## 2016-12-13 DIAGNOSIS — E039 Hypothyroidism, unspecified: Secondary | ICD-10-CM | POA: Diagnosis not present

## 2016-12-13 DIAGNOSIS — Z794 Long term (current) use of insulin: Secondary | ICD-10-CM | POA: Diagnosis not present

## 2016-12-13 DIAGNOSIS — E7800 Pure hypercholesterolemia, unspecified: Secondary | ICD-10-CM | POA: Diagnosis not present

## 2016-12-13 DIAGNOSIS — IMO0001 Reserved for inherently not codable concepts without codable children: Secondary | ICD-10-CM

## 2016-12-13 LAB — LIPID PANEL
CHOL/HDL RATIO: 2.7 ratio (ref <5.0)
CHOLESTEROL: 250 mg/dL — AB (ref <200)
HDL: 93 mg/dL (ref >50)
LDL Cholesterol: 119 mg/dL — ABNORMAL HIGH (ref <100)
TRIGLYCERIDES: 189 mg/dL — AB (ref <150)
VLDL: 38 mg/dL — AB (ref <30)

## 2016-12-13 LAB — COMPLETE METABOLIC PANEL WITH GFR
ALBUMIN: 4 g/dL (ref 3.6–5.1)
ALK PHOS: 139 U/L — AB (ref 33–130)
ALT: 30 U/L — ABNORMAL HIGH (ref 6–29)
AST: 29 U/L (ref 10–35)
BUN: 9 mg/dL (ref 7–25)
CALCIUM: 9.1 mg/dL (ref 8.6–10.4)
CHLORIDE: 101 mmol/L (ref 98–110)
CO2: 22 mmol/L (ref 20–31)
Creat: 0.72 mg/dL (ref 0.50–0.99)
GFR, Est African American: >89 mL/min (ref >=60)
GFR, Est Non African American: 89 mL/min (ref >=60)
Glucose, Bld: 159 mg/dL — ABNORMAL HIGH (ref 70–99)
POTASSIUM: 4.8 mmol/L (ref 3.5–5.3)
Sodium: 137 mmol/L (ref 135–146)
Total Bilirubin: 0.5 mg/dL (ref 0.2–1.2)
Total Protein: 6.9 g/dL (ref 6.1–8.1)

## 2016-12-13 LAB — TSH: TSH: 3.39 mIU/L

## 2016-12-13 MED ORDER — LISINOPRIL 20 MG PO TABS: 20.0000 mg | ORAL_TABLET | Freq: Every day | ORAL | 3 refills | Status: DC

## 2016-12-13 NOTE — Progress Notes (Signed)
Subjective:    Patient ID: Marissa Thompson, female    DOB: 05-18-1951, 66 y.o.   MRN: [id number]  HPI She is here today for follow-up of her diabetes. She is currently not using Humalog with supper. She is using Toujeo 34 units every morning. Fasting blood sugars a been greater than 180. She is not checking 2 hour postprandial sugar. However she has been drank 1 L of soda every day he denies any polyuria, polydipsia, or blurry vision. Diabetic foot exam is performed today. She denies any chest pain shortness of breath or dyspnea on exertion. Blood pressure today is elevated at 144/80. She is not currently on an ACE inhibitor for renal protection. She denies any myalgias or right upper quadrant pain. Past Medical History:  Diagnosis Date  . Allergy   . Anxiety 2003  . Depression 1998  . Diabetes mellitus without complication (HCC) 1998  . GERD (gastroesophageal reflux disease) 1980  . HLD (hyperlipidemia)   . Hyperlipidemia 1990  . Hypothyroidism 1998  . PONV (postoperative nausea and vomiting)    Past Surgical History:  Procedure Laterality Date  . BACK SURGERY     x3 Lumbar  . CARPAL TUNNEL RELEASE Left 03/08/2016   Procedure: LEFT CARPAL TUNNEL RELEASE;  Surgeon: Betha Loa, MD;  Location: [facility];  Service: Orthopedics;  Laterality: Left;  . CERVICAL POLYPECTOMY N/A 08/17/2013   Procedure: CERVICAL POLYPECTOMY;  Surgeon: Mickel Baas, MD;  Location: [facility];  Service: Gynecology;  Laterality: N/A;  . COLON SURGERY  2007   benign mass  . ECTOPIC PREGNANCY SURGERY    . ESOPHAGEAL DILATION     x3  . HYSTEROSCOPY W/D&C N/A 08/17/2013   Procedure: DILATATION AND CURETTAGE /HYSTEROSCOPY;  Surgeon: Mickel Baas, MD;  Location: [facility];  Service: Gynecology;  Laterality: N/A;  YAG LASER  . NECK SURGERY     c5-7 ACDF 3/15  . TONSILLECTOMY    . TRIGGER FINGER RELEASE Left 03/08/2016   Procedure: LEFT LONG TRIGGER RELEASE AND LEFT RING TRIGGER RELEASE;  Surgeon: Betha Loa, MD;  Location: [facility];  Service: Orthopedics;  Laterality: Left;   Current Outpatient Prescriptions on File Prior to Visit  Medication Sig Dispense Refill  . aspirin EC 81 MG tablet Take 81 mg by mouth daily.    . cyclobenzaprine (FLEXERIL) 10 MG tablet TAKE 1 TABLET BY MOUTH 3 TIMES DAILY AS NEEDED FOR MUSCLE SPASMS 90 tablet 1  . gabapentin (NEURONTIN) 600 MG tablet TAKE 2 TABLETS BY MOUTH 3 TIMES A DAY 180 tablet 2  . ibuprofen (ADVIL,MOTRIN) 800 MG tablet Take 800 mg by mouth 4 (four) times daily as needed.    . Insulin Pen Needle (CAREFINE PEN NEEDLES) 32G X 4 MM MISC Use as directed 100 each 11  . levothyroxine (SYNTHROID, LEVOTHROID) 150 MCG tablet take 1 tablet by mouth once daily 90 tablet 1  . metFORMIN (GLUCOPHAGE) 1000 MG tablet take 1 tablet by mouth twice a day with meals 60 tablet 5  . OVER THE COUNTER MEDICATION Sol[place] Pass Patch Apply 1 Patch Daily As Needed For Pain    . pantoprazole (PROTONIX) 40 MG tablet take 1 tablet by mouth once daily 90 tablet 3  . pravastatin (PRAVACHOL) 40 MG tablet TAKE 1 TABLET BY MOUTH DAILY WITH SUPPER 90 tablet 1  . PRECISION XTRA TEST STRIPS test strip Use to monitor FSBS 3x daily. Dx: E11.9. 100 each 11  . TOUJEO SOLOSTAR 300 UNIT/ML SOPN inject  30 units INTO THE SKIN once daily (Patient taking differently: inject 34 units INTO THE SKIN once daily) 13.5 mL 5  . venlafaxine XR (EFFEXOR-XR) 75 MG 24 hr capsule TAKE 2 CAPSULES BY MOUTH DAILY WITH BREAKFAST 60 capsule 5  . insulin lispro (HUMALOG) 100 UNIT/ML KiwkPen Inject 0.07 mLs (7 Units total) into the skin daily. At supper (Patient not taking: Reported on 12/13/2016) 15 mL 11   No current facility-administered medications on file prior to visit.    Allergies  Allergen Reactions  . Baclofen Other (See Comments)    Memory Loss and shakes  . Morphine And Related Itching  . Talwin [Pentazocine] Itching  . Toradol [Ketorolac Tromethamine] Other (See Comments)     Slurred speech, confusion  . Penicillins Rash   Social History   Social History  . Marital status: Legally Separated    Spouse name: N/A  . Number of children: 1  . Years of education: N/A   Occupational History  . Nurse    Social History Main Topics  . Smoking status: Never Smoker  . Smokeless tobacco: Never Used  . Alcohol use No  . Drug use: No  . Sexual activity: Not Currently   Other Topics Concern  . Not on file   Social History Narrative   Separated. Son lives with her.    On Disability--secondary to back. On disability since 2012.   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    Never smoked.       Review of Systems  All other systems reviewed and are negative.      Objective:   Physical Exam  Constitutional: She appears well-developed and well-nourished.  Eyes: Conjunctivae and EOM are normal. Pupils are equal, round, and reactive to light.  Neck: Neck supple. No JVD present. No thyromegaly present.  Cardiovascular: Normal rate, regular rhythm, normal heart sounds and intact distal pulses.   No murmur heard. Pulmonary/Chest: Effort normal and breath sounds normal. No respiratory distress. She has no wheezes. She has no rales. She exhibits no tenderness.  Abdominal: Soft. Bowel sounds are normal. She exhibits no distension and no mass. There is no tenderness. There is no rebound and no guarding.  Musculoskeletal: She exhibits no edema.  Lymphadenopathy:    She has no cervical adenopathy.  Vitals reviewed.         Assessment & Plan:  Uncontrolled type 2 diabetes mellitus without complication, with long-term current use of insulin (HCC) - Plan: COMPLETE METABOLIC PANEL WITH GFR, Lipid panel, Hemoglobin A1c, Microalbumin, urine  Hypothyroidism, unspecified type - Plan: TSH  Pure hypercholesterolemia  Benign essential HTN  Currently on metformin and  insulin. Check fasting blood sugar every day and increase insulin by 1 unit every day until fasting blood sugar falls below 130. Recheck with me via telephone in 2 weeks. Once fasting blood sugars are less than 130, then we will start checking her two-hour postprandial sugars. Goal two-hour postprandial sugars less than 160. May need to add mealtime insulin to achieve this. Recommended discontinuation of all soda. Recommended increasing aerobic exercise. Add lisinopril 20 mg a day for elevated blood pressure and also check a urine microalbumin. Check an LDL cholesterol. Goal LDL cholesterol is less than 100

## 2016-12-14 ENCOUNTER — Encounter: Payer: Self-pay | Admitting: Family Medicine

## 2016-12-14 LAB — HEMOGLOBIN A1C
Hgb A1c MFr Bld: 10.6 % — ABNORMAL HIGH (ref <5.7)
MEAN PLASMA GLUCOSE: 258 mg/dL

## 2016-12-14 LAB — MICROALBUMIN, URINE: Microalb, Ur: 0.5 mg/dL

## 2016-12-26 ENCOUNTER — Encounter: Payer: Medicare Other | Admitting: Gastroenterology

## 2017-01-01 ENCOUNTER — Other Ambulatory Visit: Payer: Self-pay | Admitting: Family Medicine

## 2017-01-02 ENCOUNTER — Encounter: Payer: Self-pay | Admitting: Physician Assistant

## 2017-01-02 ENCOUNTER — Ambulatory Visit (INDEPENDENT_AMBULATORY_CARE_PROVIDER_SITE_OTHER): Payer: Medicare Other | Admitting: Physician Assistant

## 2017-01-02 ENCOUNTER — Encounter: Payer: Self-pay | Admitting: Family Medicine

## 2017-01-02 VITALS — BP 132/78 | HR 84 | Temp 98.0°F | Resp 16 | Ht 59.0 in | Wt 184.2 lb

## 2017-01-02 DIAGNOSIS — W57XXXA Bitten or stung by nonvenomous insect and other nonvenomous arthropods, initial encounter: Secondary | ICD-10-CM

## 2017-01-02 DIAGNOSIS — S00461A Insect bite (nonvenomous) of right ear, initial encounter: Secondary | ICD-10-CM

## 2017-01-02 MED ORDER — DOXYCYCLINE HYCLATE 100 MG PO TABS: 100.0000 mg | ORAL_TABLET | Freq: Two times a day (BID) | ORAL | 0 refills | Status: DC

## 2017-01-02 NOTE — Progress Notes (Signed)
Patient ID: ALAJAH WITMAN MRN: [id number], DOB: 1951/03/25, 66 y.o. Date of Encounter: 01/02/2017, 2:24 PM    Chief Complaint:  Chief Complaint  Patient presents with  . Tick Removal    behind right ear      HPI: 66 y.o. year old female presents with above.   Visit she just found this tick this morning behind her right ear and removed it. I examined the site and reassured her that I can barely even tell where the tick was at all and even after further inspection finally see a very small very faint pink spot/.. Sure her that I see no other rash no other abnormality to the skin in that region. She then says "well I thought I better get on some treatment given that I'm diabetic and all ". I then again reassured her of findings on exam. She then says that last year this happened and she was treated with medicine. I reviewed OV note from 02/02/16. At that visit there was concern of erythema migrans so she was treated for Lyme disease. I explained this to her and discussed treatment guidelines and indications for treatment.  She then shows me where she had another recent tick bite on her mid low back.   She has had no fever. She has seen no other areas of rash other than just these tick bite sites. She has had no increased myalgias/aches and pains, no increased fatigue/malaise. She has had no headache. No other symptoms of illness.     Home Meds:   Outpatient Medications Prior to Visit  Medication Sig Dispense Refill  . aspirin EC 81 MG tablet Take 81 mg by mouth daily.    . cyclobenzaprine (FLEXERIL) 10 MG tablet TAKE 1 TABLET BY MOUTH 3 TIMES DAILY AS NEEDED FOR MUSCLE SPASMS 90 tablet 1  . gabapentin (NEURONTIN) 600 MG tablet TAKE 2 TABLETS BY MOUTH 3 TIMES A DAY 180 tablet 2  . ibuprofen (ADVIL,MOTRIN) 800 MG tablet Take 800 mg by mouth 4 (four) times daily as needed.    . insulin lispro (HUMALOG) 100 UNIT/ML KiwkPen Inject 0.07 mLs (7 Units total) into the skin daily. At  supper 15 mL 11  . Insulin Pen Needle (CAREFINE PEN NEEDLES) 32G X 4 MM MISC Use as directed 100 each 11  . Insulin Pen Needle (FIFTY50 PEN NEEDLES) 32G X 4 MM MISC Use as directed    . levothyroxine (SYNTHROID, LEVOTHROID) 150 MCG tablet take 1 tablet by mouth once daily 90 tablet 1  . lisinopril (PRINIVIL,ZESTRIL) 20 MG tablet Take 1 tablet (20 mg total) by mouth daily. 90 tablet 3  . metFORMIN (GLUCOPHAGE) 1000 MG tablet take 1 tablet by mouth twice a day with meals 60 tablet 5  . OVER THE COUNTER MEDICATION [place] Patch Apply 1 Patch Daily As Needed For Pain    . pantoprazole (PROTONIX) 40 MG tablet take 1 tablet by mouth once daily 90 tablet 3  . pravastatin (PRAVACHOL) 40 MG tablet TAKE 1 TABLET BY MOUTH DAILY WITH SUPPER 90 tablet 1  . PRECISION XTRA TEST STRIPS test strip Use to monitor FSBS 3x daily. Dx: E11.9. 100 each 11  . TOUJEO SOLOSTAR 300 UNIT/ML SOPN inject 30 units INTO THE SKIN once daily (Patient taking differently: inject 34 units INTO THE SKIN once daily) 13.5 mL 5  . venlafaxine XR (EFFEXOR-XR) 75 MG 24 hr capsule TAKE 2 CAPSULES BY MOUTH DAILY WITH BREAKFAST 60 capsule 5   No facility-administered medications  prior to visit.     Allergies:  Allergies  Allergen Reactions  . Baclofen Other (See Comments)    Memory Loss and shakes  . Morphine And Related Itching  . Talwin [Pentazocine] Itching  . Toradol [Ketorolac Tromethamine] Other (See Comments)    Slurred speech, confusion  . Penicillins Rash      Review of Systems: See HPI for pertinent ROS. All other ROS negative.    Physical Exam: Blood pressure 132/78, pulse 84, temperature 98 F (36.7 C), temperature source Oral, resp. rate 16, height 4\' 11"  (1.499 m), weight 184 lb 3.2 oz (83.6 kg), SpO2 97 %., Body mass index is 37.2 kg/m. General:  WNWD WF. Appears in no acute distress. Neck: Supple. No thyromegaly. No lymphadenopathy. Lungs: Clear bilaterally to auscultation without wheezes, rales, or  rhonchi. Breathing is unlabored. Heart: Regular rhythm. No murmurs, rubs, or gallops. Msk:  Strength and tone normal for age. Skin: Just right behind right ear--- she points to area where she pulled the tick off this morning. Site looks totally normal and I cannot even really see anything there. Finally am able to see very light pink very tiny pink dot. No other areas of rash. At midline of low back there is another site of a more remote tick site. This site is slightly raised and with a very tiny scab right at the bite site. Otherwise remaining skin in that region/surrounding skin is normal. I inspected entire back and see no rash. She has seen no rashes elsewhere on her body. Neuro: Alert and oriented X 3. Moves all extremities spontaneously. Gait is normal. CNII-XII grossly in tact. Psych:  Responds to questions appropriately with a normal affect.     ASSESSMENT AND PLAN:  66 y.o. year old female with  1. Tick bite, initial encounter Discussed, explained to patient that she really has no indication to require treatment but just to be on the safe side and to reassure her go ahead and give her several days of doxycycline to help prevent her developing Lyme or [facility] spotted fever. She is to avoid sun exposure while on medicine. She stone take the doxycycline as directed. Follow-up if develops signs or symptoms of Lyme or [place] spotted fever. - doxycycline (VIBRA-TABS) 100 MG tablet; Take 1 tablet (100 mg total) by mouth 2 (two) times daily.  Dispense: 6 tablet; Refill: 0   Signed, [address], [place], [facility] 01/02/2017 2:24 PM

## 2017-01-07 ENCOUNTER — Other Ambulatory Visit: Payer: Self-pay | Admitting: Family Medicine

## 2017-01-17 ENCOUNTER — Other Ambulatory Visit: Payer: Self-pay | Admitting: Family Medicine

## 2017-01-17 NOTE — Telephone Encounter (Signed)
Ok to refill 

## 2017-01-17 NOTE — Telephone Encounter (Signed)
ok 

## 2017-02-14 ENCOUNTER — Other Ambulatory Visit: Payer: Self-pay | Admitting: Family Medicine

## 2017-02-14 MED ORDER — INSULIN GLARGINE 300 UNIT/ML ~~LOC~~ SOPN: 40.0000 [IU] | PEN_INJECTOR | Freq: Every day | SUBCUTANEOUS | 5 refills | Status: DC

## 2017-02-15 ENCOUNTER — Telehealth: Payer: Self-pay | Admitting: Family Medicine

## 2017-02-15 NOTE — Telephone Encounter (Signed)
Pt needs refill on metformin, effexor, and humalog [facility] [address]

## 2017-02-18 ENCOUNTER — Telehealth: Payer: Self-pay | Admitting: Family Medicine

## 2017-02-18 MED ORDER — INSULIN LISPRO 100 UNIT/ML (KWIKPEN): 7.0000 [IU] | PEN_INJECTOR | Freq: Every day | SUBCUTANEOUS | 3 refills | Status: DC

## 2017-02-18 MED ORDER — METFORMIN HCL 1000 MG PO TABS: 1000.0000 mg | ORAL_TABLET | Freq: Two times a day (BID) | ORAL | 5 refills | Status: DC

## 2017-02-18 MED ORDER — VENLAFAXINE HCL ER 75 MG PO CP24: ORAL_CAPSULE | ORAL | 5 refills | Status: DC

## 2017-02-18 NOTE — Telephone Encounter (Signed)
Tried to call no answer and no vm 

## 2017-02-18 NOTE — Telephone Encounter (Signed)
[id number] PATIENT WOULD LIKE CHANGE HER DOSAGE OF TOUJEO AND WOULD LIKE A CALL BACK REGARDING THIS

## 2017-02-18 NOTE — Telephone Encounter (Signed)
Medication called/sent to requested pharmacy  

## 2017-02-19 ENCOUNTER — Other Ambulatory Visit: Payer: Self-pay | Admitting: Family Medicine

## 2017-02-19 DIAGNOSIS — Z1231 Encounter for screening mammogram for malignant neoplasm of breast: Secondary | ICD-10-CM

## 2017-02-20 NOTE — Telephone Encounter (Signed)
LMTRC

## 2017-02-27 ENCOUNTER — Ambulatory Visit
Admission: RE | Admit: 2017-02-27 | Discharge: 2017-02-27 | Disposition: A | Discharge status: Home / Self Care | Payer: Medicare Other | Source: Ambulatory Visit | Attending: Family Medicine | Admitting: Family Medicine

## 2017-02-27 DIAGNOSIS — Z1231 Encounter for screening mammogram for malignant neoplasm of breast: Secondary | ICD-10-CM

## 2017-03-04 ENCOUNTER — Other Ambulatory Visit: Payer: Self-pay | Admitting: Family Medicine

## 2017-03-04 DIAGNOSIS — K219 Gastro-esophageal reflux disease without esophagitis: Secondary | ICD-10-CM

## 2017-03-04 MED ORDER — PANTOPRAZOLE SODIUM 40 MG PO TBEC: 40.0000 mg | DELAYED_RELEASE_TABLET | Freq: Every day | ORAL | 3 refills | Status: DC

## 2017-03-04 MED ORDER — LEVOTHYROXINE SODIUM 150 MCG PO TABS: 150.0000 ug | ORAL_TABLET | Freq: Every day | ORAL | 1 refills | Status: DC

## 2017-03-04 MED ORDER — PRAVASTATIN SODIUM 40 MG PO TABS: ORAL_TABLET | ORAL | 1 refills | Status: DC

## 2017-03-04 MED ORDER — GABAPENTIN 600 MG PO TABS: 1200.0000 mg | ORAL_TABLET | Freq: Three times a day (TID) | ORAL | 2 refills | Status: DC

## 2017-03-04 MED ORDER — LISINOPRIL 20 MG PO TABS: 20.0000 mg | ORAL_TABLET | Freq: Every day | ORAL | 3 refills | Status: DC

## 2017-03-06 ENCOUNTER — Other Ambulatory Visit: Payer: Self-pay | Admitting: Family Medicine

## 2017-03-06 MED ORDER — GABAPENTIN 600 MG PO TABS: 1200.0000 mg | ORAL_TABLET | Freq: Three times a day (TID) | ORAL | 2 refills | Status: DC

## 2017-03-15 ENCOUNTER — Telehealth: Payer: Self-pay | Admitting: Family Medicine

## 2017-03-15 MED ORDER — CYCLOBENZAPRINE HCL 10 MG PO TABS: ORAL_TABLET | ORAL | 1 refills | Status: DC

## 2017-03-15 NOTE — Telephone Encounter (Signed)
CB# [phone]  Patient needs a refill called into [facility] on [address] flexeril 10 mg

## 2017-03-15 NOTE — Telephone Encounter (Signed)
Medication called/sent to requested pharmacy  

## 2017-03-15 NOTE — Telephone Encounter (Signed)
ok 

## 2017-03-15 NOTE — Telephone Encounter (Signed)
Ok to refill 

## 2017-03-21 ENCOUNTER — Encounter: Payer: Self-pay | Admitting: Physician Assistant

## 2017-03-21 ENCOUNTER — Ambulatory Visit (INDEPENDENT_AMBULATORY_CARE_PROVIDER_SITE_OTHER): Payer: Medicare Other | Admitting: Physician Assistant

## 2017-03-21 VITALS — BP 130/60 | HR 94 | Temp 98.1°F | Resp 20 | Ht 59.0 in | Wt 189.0 lb

## 2017-03-21 DIAGNOSIS — R300 Dysuria: Secondary | ICD-10-CM

## 2017-03-21 DIAGNOSIS — N390 Urinary tract infection, site not specified: Secondary | ICD-10-CM

## 2017-03-21 LAB — URINALYSIS, ROUTINE W REFLEX MICROSCOPIC
BACTERIA UA: NONE SEEN /HPF
BILIRUBIN URINE: NEGATIVE
GLUCOSE, UA: NEGATIVE
Hgb urine dipstick: NEGATIVE
Ketones, ur: NEGATIVE
Nitrite: NEGATIVE
PH: 6 (ref 5.0–8.0)
PROTEIN: NEGATIVE
RBC / HPF: NONE SEEN /HPF (ref 0–2)
Specific Gravity, Urine: 1.015 (ref 1.001–1.03)

## 2017-03-21 LAB — MICROSCOPIC MESSAGE

## 2017-03-21 MED ORDER — CIPROFLOXACIN HCL 500 MG PO TABS: 500.0000 mg | ORAL_TABLET | Freq: Two times a day (BID) | ORAL | 0 refills | Status: DC

## 2017-03-21 NOTE — Progress Notes (Signed)
Patient ID: Marissa CornerJudith M Garriga MRN: [id number], DOB: 07/02/1951, 66 y.o. Date of Encounter: 03/21/2017, 11:48 AM    Chief Complaint:  Chief Complaint  Patient presents with  . Painful urination with bad odor     HPI: 66 y.o. year old female presents with above.   She reports "it hurts when I urinate. It just started yesterday ". She reports that she is not feeling any discomfort in her vaginal area at other times. Only feels discomfort when urinating. Is having no vaginal irritation itching or discharge.  Has had no pain at level of costophrenic angles bilaterally. Has had no fevers or chills. Has had no abdominal pain. Has not really noticed much frequency or urgency.    Home Meds:   Outpatient Medications Prior to Visit  Medication Sig Dispense Refill  . aspirin EC 81 MG tablet Take 81 mg by mouth daily.    . cyclobenzaprine (FLEXERIL) 10 MG tablet TAKE 1 TABLET BY MOUTH 3 TIMES DAILY AS NEEDED FOR MUSCLE SPASMS 90 tablet 1  . gabapentin (NEURONTIN) 600 MG tablet Take 2 tablets (1,200 mg total) by mouth 3 (three) times daily. 180 tablet 2  . ibuprofen (ADVIL,MOTRIN) 800 MG tablet Take 800 mg by mouth 4 (four) times daily as needed.    . Insulin Glargine (TOUJEO SOLOSTAR) 300 UNIT/ML SOPN Inject 40 Units into the skin daily. 5 pen 5  . insulin lispro (HUMALOG) 100 UNIT/ML KiwkPen Inject 0.07 mLs (7 Units total) into the skin daily. At supper 15 mL 3  . Insulin Pen Needle (CAREFINE PEN NEEDLES) 32G X 4 MM MISC Use as directed 100 each 11  . Insulin Pen Needle (FIFTY50 PEN NEEDLES) 32G X 4 MM MISC Use as directed    . levothyroxine (SYNTHROID, LEVOTHROID) 150 MCG tablet Take 1 tablet (150 mcg total) by mouth daily. 90 tablet 1  . lisinopril (PRINIVIL,ZESTRIL) 20 MG tablet Take 1 tablet (20 mg total) by mouth daily. 90 tablet 3  . metFORMIN (GLUCOPHAGE) 1000 MG tablet Take 1 tablet (1,000 mg total) by mouth 2 (two) times daily with a meal. 60 tablet 5  . OVER THE COUNTER MEDICATION  [place] Patch Apply 1 Patch Daily As Needed For Pain    . pantoprazole (PROTONIX) 40 MG tablet Take 1 tablet (40 mg total) by mouth daily. 90 tablet 3  . pravastatin (PRAVACHOL) 40 MG tablet TAKE 1 TABLET BY MOUTH DAILY WITH SUPPER 90 tablet 1  . PRECISION XTRA TEST STRIPS test strip Use to monitor FSBS 3x daily. Dx: E11.9. 100 each 11  . venlafaxine XR (EFFEXOR-XR) 75 MG 24 hr capsule TAKE 2 CAPSULES BY MOUTH DAILY WITH BREAKFAST 60 capsule 5  . doxycycline (VIBRA-TABS) 100 MG tablet Take 1 tablet (100 mg total) by mouth 2 (two) times daily. 6 tablet 0   No facility-administered medications prior to visit.     Allergies:  Allergies  Allergen Reactions  . Baclofen Other (See Comments)    Memory Loss and shakes  . Morphine And Related Itching  . Talwin [Pentazocine] Itching  . Toradol [Ketorolac Tromethamine] Other (See Comments)    Slurred speech, confusion  . Penicillins Rash      Review of Systems: See HPI for pertinent ROS. All other ROS negative.    Physical Exam: Blood pressure 130/60, pulse 94, temperature 98.1 F (36.7 C), temperature source Oral, resp. rate 20, height 4\' 11"  (1.499 m), weight 189 lb (85.7 kg), SpO2 98 %., Body mass index is 38.17 kg/m.  General:  Obese WF. Appears in no acute distress. Neck: Supple. No thyromegaly. No lymphadenopathy. Lungs: Clear bilaterally to auscultation without wheezes, rales, or rhonchi. Breathing is unlabored. Heart: Regular rhythm. No murmurs, rubs, or gallops. Abdomen: Soft, non-tender, non-distended with normoactive bowel sounds. No hepatomegaly. No rebound/guarding. No obvious abdominal masses. Msk:  Strength and tone normal for age. No tenderness with percussion to costophrenic angles bilaterally. Extremities/Skin: Warm and dry.  Neuro: Alert and oriented X 3. Moves all extremities spontaneously. Gait is normal. CNII-XII grossly in tact. Psych:  Responds to questions appropriately with a normal affect.      ASSESSMENT  AND PLAN:  66 y.o. year old female with  1. Dysuria - Urinalysis, Routine w reflex microscopic  2. Urinary tract infection without hematuria, site unspecified Reviewed urinalysis results with lab. Shows 1+ leukocytes but no other significant abnormality. I discussed these findings with patient. Discussed that it is possible that since patient's symptoms just started yesterday that she is at the beginning of a UTI but has not gotten too bad yet. I again reviewed symptoms with patient to make sure that she is having no symptoms of vaginitis. Therefore will go ahead and start Cipro to cover for UTI but will follow up culture. - ciprofloxacin (CIPRO) 500 MG tablet; Take 1 tablet (500 mg total) by mouth 2 (two) times daily.  Dispense: 10 tablet; Refill: 0 - Urine Culture   Signed, [address] [place], [place], [facility] 03/21/2017 11:48 AM

## 2017-03-22 LAB — URINE CULTURE
MICRO NUMBER:: 80979448
SPECIMEN QUALITY:: ADEQUATE

## 2017-03-25 ENCOUNTER — Encounter: Payer: Self-pay | Admitting: Family Medicine

## 2017-04-15 ENCOUNTER — Other Ambulatory Visit: Payer: Self-pay | Admitting: Family Medicine

## 2017-04-15 MED ORDER — INSULIN PEN NEEDLE 32G X 4 MM MISC: 11 refills | Status: DC

## 2017-04-16 ENCOUNTER — Other Ambulatory Visit: Payer: Self-pay | Admitting: Family Medicine

## 2017-04-16 NOTE — Telephone Encounter (Signed)
Medication refilled per protocol. 

## 2017-04-30 ENCOUNTER — Other Ambulatory Visit: Payer: Self-pay | Admitting: Family Medicine

## 2017-04-30 NOTE — Telephone Encounter (Signed)
ok 

## 2017-04-30 NOTE — Telephone Encounter (Signed)
Ok to refill 

## 2017-04-30 NOTE — Telephone Encounter (Signed)
Prescription sent to pharmacy.

## 2017-06-20 ENCOUNTER — Other Ambulatory Visit: Payer: Self-pay | Admitting: Family Medicine

## 2017-06-20 ENCOUNTER — Encounter: Payer: Medicare Other | Admitting: Family Medicine

## 2017-06-20 NOTE — Telephone Encounter (Signed)
ok 

## 2017-06-20 NOTE — Telephone Encounter (Signed)
Prescription sent to pharmacy.

## 2017-06-20 NOTE — Telephone Encounter (Signed)
Ok to refill 

## 2017-06-21 ENCOUNTER — Telehealth: Payer: Self-pay | Admitting: Family Medicine

## 2017-06-21 NOTE — Telephone Encounter (Signed)
PA Submitted through [email or web address] and received the following:  Your information has been submitted to [employer] [place]. [employer] [place] will review the request and notify you of the determination decision directly, typically within 3 business days of your submission and once all necessary information is received.  You will also receive your request decision electronically. To check for an update later, open the request again from your dashboard.  If [employer] [place] has not responded within the specified timeframe or if you have any questions about your PA submission, contact [employer] [place] directly at (MAPD) [phone] or (PDP) [phone]. 

## 2017-06-26 MED ORDER — CYCLOBENZAPRINE HCL 10 MG PO TABS: ORAL_TABLET | ORAL | 1 refills | Status: DC

## 2017-06-26 NOTE — Telephone Encounter (Signed)
Approved on December 7  Effective from 06/21/2017 through 06/21/2018.   Pharmacy aware

## 2017-07-08 ENCOUNTER — Encounter: Payer: Medicare Other | Admitting: Gastroenterology

## 2017-07-17 ENCOUNTER — Ambulatory Visit (AMBULATORY_SURGERY_CENTER): Payer: Self-pay

## 2017-07-17 VITALS — Ht 59.0 in | Wt 186.4 lb

## 2017-07-17 DIAGNOSIS — K6389 Other specified diseases of intestine: Secondary | ICD-10-CM

## 2017-07-17 DIAGNOSIS — K639 Disease of intestine, unspecified: Secondary | ICD-10-CM

## 2017-07-17 DIAGNOSIS — R1310 Dysphagia, unspecified: Secondary | ICD-10-CM

## 2017-07-17 DIAGNOSIS — R1319 Other dysphagia: Secondary | ICD-10-CM

## 2017-07-17 NOTE — Progress Notes (Signed)
Per pt, no allergies to soy or egg products.Pt not taking any weight loss meds or using  O2 at home.   Pt refused Emmi video. 

## 2017-07-25 ENCOUNTER — Other Ambulatory Visit: Payer: Self-pay

## 2017-07-25 ENCOUNTER — Ambulatory Visit (AMBULATORY_SURGERY_CENTER): Payer: Medicare Other | Admitting: Gastroenterology

## 2017-07-25 ENCOUNTER — Encounter: Payer: Self-pay | Admitting: Gastroenterology

## 2017-07-25 VITALS — BP 114/54 | HR 85 | Temp 96.8°F | Resp 17 | Ht 59.0 in | Wt 186.0 lb

## 2017-07-25 DIAGNOSIS — R1310 Dysphagia, unspecified: Secondary | ICD-10-CM

## 2017-07-25 DIAGNOSIS — Z85038 Personal history of other malignant neoplasm of large intestine: Secondary | ICD-10-CM | POA: Diagnosis present

## 2017-07-25 DIAGNOSIS — K6389 Other specified diseases of intestine: Secondary | ICD-10-CM

## 2017-07-25 MED ORDER — SODIUM CHLORIDE 0.9 % IV SOLN: 500.0000 mL | Freq: Once | INTRAVENOUS | Status: DC

## 2017-07-25 NOTE — Progress Notes (Signed)
Pt's states no medical or surgical changes since previsit or office visit.Pt states no allergy to soy or eggs. 

## 2017-07-25 NOTE — Op Note (Signed)
[facility] Patient Name: Marissa Thompson Procedure Date: 07/25/2017 8:41 AM MRN: [id number] Endoscopist: Napoleon Form , MD Age: 67 Referring MD:  Date of Birth: Sep 03, 1950 Gender: Female Account #: [id number] Procedure:                Upper GI endoscopy Indications:              Dysphagia Medicines:                Monitored Anesthesia Care Procedure:                Pre-Anesthesia Assessment:                           - Prior to the procedure, a History and Physical                            was performed, and patient medications and                            allergies were reviewed. The patient's tolerance of                            previous anesthesia was also reviewed. The risks                            and benefits of the procedure and the sedation                            options and risks were discussed with the patient.                            All questions were answered, and informed consent                            was obtained. Prior Anticoagulants: The patient has                            taken no previous anticoagulant or antiplatelet                            agents. ASA Grade Assessment: II - A patient with                            mild systemic disease. After reviewing the risks                            and benefits, the patient was deemed in                            satisfactory condition to undergo the procedure.                           After obtaining informed consent, the endoscope was  passed under direct vision. Throughout the                            procedure, the patient's blood pressure, pulse, and                            oxygen saturations were monitored continuously. The                            Endoscope was introduced through the mouth, and                            advanced to the second part of duodenum. The upper                            GI endoscopy was accomplished without  difficulty.                            The patient tolerated the procedure well. Scope In: Scope Out: Findings:                 Esophagogastric landmarks were identified: the                            gastroesophageal junction was found at 35 cm from                            the incisors.                           No endoscopic abnormality was evident in the                            esophagus to explain the patient's complaint of                            dysphagia.                           The stomach was normal.                           The examined duodenum was normal. Complications:            No immediate complications. Estimated Blood Loss:     Estimated blood loss: none. Impression:               - Esophagogastric landmarks identified.                           - No endoscopic esophageal abnormality to explain                            patient's dysphagia.                           - Normal stomach.                           -  Normal examined duodenum.                           - No specimens collected. Recommendation:           - Patient has a contact number available for                            emergencies. The signs and symptoms of potential                            delayed complications were discussed with the                            patient. Return to normal activities tomorrow.                            Written discharge instructions were provided to the                            patient.                           - Resume previous diet.                           - Continue present medications. Napoleon FormKavitha V. Stevens [place], MD 07/25/2017 9:26:14 AM This report has been signed electronically.

## 2017-07-25 NOTE — Patient Instructions (Signed)
YOU HAD AN ENDOSCOPIC PROCEDURE TODAY AT THE [facility]:   Refer to the procedure report that was given to you for any specific questions about what was found during the examination.  If the procedure report does not answer your questions, please call your gastroenterologist to clarify.  If you requested that your care partner not be given the details of your procedure findings, then the procedure report has been included in a sealed envelope for you to review at your convenience later.  YOU SHOULD EXPECT: Some feelings of bloating in the abdomen. Passage of more gas than usual.  Walking can help get rid of the air that was put into your GI tract during the procedure and reduce the bloating. If you had a lower endoscopy (such as a colonoscopy or flexible sigmoidoscopy) you may notice spotting of blood in your stool or on the toilet paper. If you underwent a bowel prep for your procedure, you may not have a normal bowel movement for a few days.  Please Note:  You might notice some irritation and congestion in your nose or some drainage.  This is from the oxygen used during your procedure.  There is no need for concern and it should clear up in a day or so.  SYMPTOMS TO REPORT IMMEDIATELY:   Following lower endoscopy (colonoscopy or flexible sigmoidoscopy):  Excessive amounts of blood in the stool  Significant tenderness or worsening of abdominal pains  Swelling of the abdomen that is new, acute  Fever of 100F or higher   Following upper endoscopy (EGD)  Vomiting of blood or coffee ground material  New chest pain or pain under the shoulder blades  Painful or persistently difficult swallowing  New shortness of breath  Fever of 100F or higher  Black, tarry-looking stools  For urgent or emergent issues, a gastroenterologist can be reached at any hour by calling [phone].   DIET:  We do recommend a small meal at first, but then you may proceed to your regular diet.  Drink  plenty of fluids but you should avoid alcoholic beverages for 24 hours.  ACTIVITY:  You should plan to take it easy for the rest of today and you should NOT DRIVE or use heavy machinery until tomorrow (because of the sedation medicines used during the test).    FOLLOW UP: Our staff will call the number listed on your records the next business day following your procedure to check on you and address any questions or concerns that you may have regarding the information given to you following your procedure. If we do not reach you, we will leave a message.  However, if you are feeling well and you are not experiencing any problems, there is no need to return our call.  We will assume that you have returned to your regular daily activities without incident.  If any biopsies were taken you will be contacted by phone or by letter within the next 1-3 weeks.  Please call us at [phone] if you have not heard about the biopsies in 3 weeks.    SIGNATURES/CONFIDENTIALITY: You and/or your care partner have signed paperwork which will be entered into your electronic medical record.  These signatures attest to the fact that that the information above on your After Visit Summary has been reviewed and is understood.  Full responsibility of the confidentiality of this discharge information lies with you and/or your care-partner. 

## 2017-07-25 NOTE — Progress Notes (Signed)
Report given to PACU, vss 

## 2017-07-25 NOTE — Op Note (Addendum)
[facility] Patient Name: Marissa HewsJudith Thompson Procedure Date: 07/25/2017 8:41 AM MRN: [id number] Endoscopist: Napoleon FormKavitha V. Nandigam , MD Age: [age] Referring MD:  Date of Birth: 11/05/1950 Gender: Female Account #: [id number] Procedure:                Colonoscopy Indications:              High risk colon cancer surveillance: Personal                            history of colon cancer Medicines:                Monitored Anesthesia Care Procedure:                Pre-Anesthesia Assessment:                           - Prior to the procedure, a History and Physical                            was performed, and patient medications and                            allergies were reviewed. The patient's tolerance of                            previous anesthesia was also reviewed. The risks                            and benefits of the procedure and the sedation                            options and risks were discussed with the patient.                            All questions were answered, and informed consent                            was obtained. Prior Anticoagulants: The patient has                            taken no previous anticoagulant or antiplatelet                            agents. ASA Grade Assessment: II - A patient with                            mild systemic disease. After reviewing the risks                            and benefits, the patient was deemed in                            satisfactory condition to undergo the procedure.  After obtaining informed consent, the colonoscope                            was passed under direct vision. Throughout the                            procedure, the patient's blood pressure, pulse, and                            oxygen saturations were monitored continuously. The                            Model PCF-[id number]) scope was introduced                            through the anus and advanced to  the the transverse                            colon for evaluation. This was the intended extent.                            The colonoscopy was technically difficult and                            complex due to inadequate bowel prep. The patient                            tolerated the procedure well. The quality of the                            bowel preparation was poor. The rectum was                            photographed. Scope In: 9:01:40 AM Scope Out: 9:06:54 AM Total Procedure Duration: 0 hours 5 minutes 14 seconds  Findings:                 The perianal and digital rectal examinations were                            normal.                           a large amount of semi-liquid with residual fiber                            and stool was found in the rectum, in the sigmoid                            colon, in the descending colon and in the                            transverse colon, interfering with visulization of  the lumen and navigation. Complications:            No immediate complications. Estimated Blood Loss:     Estimated blood loss: none. Impression:               - Preparation of the colon was poor.                           - Stool in the rectum, in the sigmoid colon, in the                            descending colon and in the transverse colon.                           - No specimens collected. Recommendation:           - Patient has a contact number available for                            emergencies. The signs and symptoms of potential                            delayed complications were discussed with the                            patient. Return to normal activities tomorrow.                            Written discharge instructions were provided to the                            patient.                           - Resume previous diet.                           - Continue present medications.                           -  Repeat colonoscopy at the next available                            appointment because the bowel preparation was                            suboptimal.                           - For future colonoscopy the patient will require                            an extended preparation. If there are any                            questions, please contact the gastroenterologist. Napoleon Form, MD 07/25/2017 9:34:04 AM This report has been  signed electronically.

## 2017-07-26 ENCOUNTER — Telehealth: Payer: Self-pay | Admitting: *Deleted

## 2017-07-26 NOTE — Telephone Encounter (Signed)
No answer. No identifier. Message left to call if questions or concerns and that we will attempt to call again later in the day.

## 2017-07-26 NOTE — Telephone Encounter (Signed)
No answer, second call.  Left message to call if questions or concerns. 

## 2017-08-18 ENCOUNTER — Other Ambulatory Visit: Payer: Self-pay | Admitting: Family Medicine

## 2017-08-19 ENCOUNTER — Encounter: Payer: Medicare Other | Admitting: Gastroenterology

## 2017-08-25 ENCOUNTER — Other Ambulatory Visit: Payer: Self-pay | Admitting: Family Medicine

## 2017-08-27 ENCOUNTER — Ambulatory Visit: Payer: Medicare Other | Admitting: Family Medicine

## 2017-08-29 ENCOUNTER — Ambulatory Visit: Payer: Medicare Other | Admitting: Family Medicine

## 2017-08-29 ENCOUNTER — Encounter: Payer: Self-pay | Admitting: Family Medicine

## 2017-08-29 ENCOUNTER — Other Ambulatory Visit: Payer: Self-pay

## 2017-08-29 VITALS — BP 122/80 | HR 90 | Temp 99.3°F | Ht 59.0 in | Wt 183.0 lb

## 2017-08-29 DIAGNOSIS — I10 Essential (primary) hypertension: Secondary | ICD-10-CM | POA: Insufficient documentation

## 2017-08-29 DIAGNOSIS — Z23 Encounter for immunization: Secondary | ICD-10-CM

## 2017-08-29 DIAGNOSIS — E782 Mixed hyperlipidemia: Secondary | ICD-10-CM

## 2017-08-29 DIAGNOSIS — F339 Major depressive disorder, recurrent, unspecified: Secondary | ICD-10-CM | POA: Insufficient documentation

## 2017-08-29 DIAGNOSIS — E039 Hypothyroidism, unspecified: Secondary | ICD-10-CM | POA: Diagnosis not present

## 2017-08-29 DIAGNOSIS — E119 Type 2 diabetes mellitus without complications: Secondary | ICD-10-CM

## 2017-08-29 LAB — POCT GLYCOSYLATED HEMOGLOBIN (HGB A1C): Hemoglobin A1C: 9.8

## 2017-08-29 MED ORDER — DAPAGLIFLOZIN PROPANEDIOL 10 MG PO TABS: 10.0000 mg | ORAL_TABLET | Freq: Every day | ORAL | 5 refills | Status: DC

## 2017-08-29 MED ORDER — VENLAFAXINE HCL ER 75 MG PO CP24: ORAL_CAPSULE | ORAL | 5 refills | Status: DC

## 2017-08-29 NOTE — Patient Instructions (Signed)
Please return in 4-6 weeks to recheck diabetes. Please bring in your sugar log sheet.   I will release your lab results to you on your MyChart account with further instructions. Please reply with any questions.   STOP Humalog. Continue Metformin.  Add [place].     It was a pleasure meeting you today! Thank you for choosing [place] to meet your healthcare needs! I truly look forward to working with you. If you have any questions or concerns, please send me a message via Mychart or call the office at [phone].  Diabetes Mellitus and Standards of Medical Care Managing diabetes (diabetes mellitus) can be complicated. Your diabetes treatment may be managed by a team of health care providers, including:  A diet and nutrition specialist (registered dietitian).  A nurse.  A certified diabetes educator (CDE).  A diabetes specialist (endocrinologist).  An eye doctor.  A primary care provider.  A dentist.  Your health care providers follow a schedule in order to help you get the best quality of care. The following schedule is a general guideline for your diabetes management plan. Your health care providers may also give you more specific instructions. HbA1c ( hemoglobin A1c) test This test provides information about blood sugar (glucose) control over the previous 2-3 months. It is used to check whether your diabetes management plan needs to be adjusted.  If you are meeting your treatment goals, this test is done at least 2 times a year.  If you are not meeting treatment goals or if your treatment goals have changed, this test is done 4 times a year.  Blood pressure test  This test is done at every routine medical visit. For most people, the goal is less than 130/80. Ask your health care provider what your goal blood pressure should be. Dental and eye exams  Visit your dentist two times a year.  If you have type 1 diabetes, get an eye exam 3-5 years after you are diagnosed, and then  once a year after your first exam. ? If you were diagnosed with type 1 diabetes as a child, get an eye exam when you are age 3 or older and have had diabetes for 3-5 years. After the first exam, you should get an eye exam once a year.  If you have type 2 diabetes, have an eye exam as soon as you are diagnosed, and then once a year after your first exam. Foot care exam  Visual foot exams are done at every routine medical visit. The exams check for cuts, bruises, redness, blisters, sores, or other problems with the feet.  A complete foot exam is done by your health care provider once a year. This exam includes an inspection of the structure and skin of your feet, and a check of the pulses and sensation in your feet. ? Type 1 diabetes: Get your first exam 3-5 years after diagnosis. ? Type 2 diabetes: Get your first exam as soon as you are diagnosed.  Check your feet every day for cuts, bruises, redness, blisters, or sores. If you have any of these or other problems that are not healing, contact your health care provider. Kidney function test ( urine microalbumin)  This test is done once a year. ? Type 1 diabetes: Get your first test 5 years after diagnosis. ? Type 2 diabetes: Get your first test as soon as you are diagnosed.  If you have chronic kidney disease (CKD), get a serum creatinine and estimated glomerular filtration rate (eGFR)  test once a year. Lipid profile (cholesterol, HDL, LDL, triglycerides)  This test should be done when you are diagnosed with diabetes, and every 5 years after the first test. If you are on medicines to lower your cholesterol, you may need to get this test done every year. ? The goal for LDL is less than 100 mg/dL (5.5 mmol/L). If you are at high risk, the goal is less than 70 mg/dL (3.9 mmol/L). ? The goal for HDL is 40 mg/dL (2.2 mmol/L) for men and 50 mg/dL(2.8 mmol/L) for women. An HDL cholesterol of 60 mg/dL (3.3 mmol/L) or higher gives some protection  against heart disease. ? The goal for triglycerides is less than 150 mg/dL (8.3 mmol/L). Immunizations  The yearly flu (influenza) vaccine is recommended for everyone 6 months or older who has diabetes.  The pneumonia (pneumococcal) vaccine is recommended for everyone 2 years or older who has diabetes. If you are 43 or older, you may get the pneumonia vaccine as a series of two separate shots.  The hepatitis B vaccine is recommended for adults shortly after they have been diagnosed with diabetes.  The Tdap (tetanus, diphtheria, and pertussis) vaccine should be given: ? According to normal childhood vaccination schedules, for children. ? Every 10 years, for adults who have diabetes.  The shingles vaccine is recommended for people who have had chicken pox and are 50 years or older. Mental and emotional health  Screening for symptoms of eating disorders, anxiety, and depression is recommended at the time of diagnosis and afterward as needed. If your screening shows that you have symptoms (you have a positive screening result), you may need further evaluation and be referred to a mental health care provider. Diabetes self-management education  Education about how to manage your diabetes is recommended at diagnosis and ongoing as needed. Treatment plan  Your treatment plan will be reviewed at every medical visit. Summary  Managing diabetes (diabetes mellitus) can be complicated. Your diabetes treatment may be managed by a team of health care providers.  Your health care providers follow a schedule in order to help you get the best quality of care.  Standards of care including having regular physical exams, blood tests, blood pressure monitoring, immunizations, screening tests, and education about how to manage your diabetes.  Your health care providers may also give you more specific instructions based on your individual health. This information is not intended to replace advice given to  you by your health care provider. Make sure you discuss any questions you have with your health care provider. Document Released: 04/29/2009 Document Revised: 03/30/2016 Document Reviewed: 03/30/2016 Elsevier Interactive Patient Education  2018 [employer].   Type 2 Diabetes Mellitus, Self Care, Adult When you have type 2 diabetes (type 2 diabetes mellitus), you must keep your blood sugar (glucose) under control. You can do this with:  Nutrition.  Exercise.  Lifestyle changes.  Medicines or insulin, if needed.  Support from your doctors and others.  How do I manage my blood sugar?  Check your blood sugar level every day, as often as told.  Call your doctor if your blood sugar is above your goal numbers for 2 tests in a row.  Have your A1c (hemoglobin A1c) level checked at least two times a year. Have it checked more often if your doctor tells you to. Your doctor will set treatment goals for you. Generally, you should have these blood sugar levels:  Before meals (preprandial): 80-130 mg/dL (4.4-7.2 mmol/L).  After meals (  postprandial): lower than 180 mg/dL (10 mmol/L).  A1c level: less than 7%.  What do I need to know about high blood sugar? High blood sugar is called hyperglycemia. Know the signs of high blood sugar. Signs may include:  Feeling: ? Thirsty. ? Hungry. ? Very tired.  Needing to pee (urinate) more than usual.  Blurry vision.  What do I need to know about low blood sugar? Low blood sugar is called hypoglycemia. This is when blood sugar is at or below 70 mg/dL (3.9 mmol/L). Symptoms may include:  Feeling: ? Hungry. ? Worried or nervous (anxious). ? Sweaty and clammy. ? Confused. ? Dizzy. ? Sleepy. ? Sick to your stomach (nauseous).  Having: ? A fast heartbeat (palpitations). ? A headache. ? A change in your vision. ? Jerky movements that you cannot control (seizure). ? Nightmares. ? Tingling or no feeling (numbness) around the mouth, lips,  or tongue.  Having trouble with: ? Talking. ? Paying attention (concentrating). ? Moving (coordination). ? Sleeping.  Shaking.  Passing out (fainting).  Getting upset easily (irritability).  Treating low blood sugar  To treat low blood sugar, eat or drink something sugary right away. If you can think clearly and swallow safely, follow the 15:15 rule:  Take 15 grams of a fast-acting carb (carbohydrate). Some fast-acting carbs are: ? 1 tube of glucose gel. ? 3 sugar tablets (glucose pills). ? 6-8 pieces of hard candy. ? 4 oz (120 mL) of fruit juice. ? 4 oz (120 mL) regular (not diet) soda.  Check your blood sugar 15 minutes after you take the carb.  If your blood sugar is still at or below 70 mg/dL (3.9 mmol/L), take 15 grams of a carb again.  If your blood sugar does not go above 70 mg/dL (3.9 mmol/L) after 3 tries, get help right away.  After your blood sugar goes back to normal, eat a meal or a snack within 1 hour.  Treating very low blood sugar If your blood sugar is at or below 54 mg/dL (3 mmol/L), you have very low blood sugar (severe hypoglycemia). This is an emergency. Do not wait to see if the symptoms will go away. Get medical help right away. Call your local emergency services (911 in the [place] Do not drive yourself to the hospital. If you have very low blood sugar and you cannot eat or drink, you may need a glucagon shot (injection). A family member or friend should learn how to check your blood sugar and how to give you a glucagon shot. Ask your doctor if you need to have a glucagon shot kit at home. What else is important to manage my diabetes? Medicine Follow these instructions about insulin and diabetes medicines:  Take them as told by your doctor.  Adjust them as told by your doctor.  Do not run out of them.  Having diabetes can raise your risk for other long-term conditions. These include heart or kidney disease. Your doctor may prescribe medicines to  help prevent problems from diabetes. Food   Make healthy food choices. These include: ? Chicken, fish, egg whites, and beans. ? Oats, whole wheat, bulgur, brown rice, quinoa, and millet. ? Fresh fruits and vegetables. ? Low-fat dairy products. ? Nuts, avocado, olive oil, and canola oil.  Make a food plan with a specialist (dietitian).  Follow instructions from your doctor about what you cannot eat or drink.  Drink enough fluid to keep your pee (urine) clear or pale yellow.  Eat healthy snacks  between healthy meals.  Keep track of carbs that you eat. Read food labels. Learn food serving sizes.  Follow your sick day plan when you cannot eat or drink normally. Make this plan with your doctor so it is ready to use. Activity  Exercise at least 3 times a week.  Do not go more than 2 days without exercising.  Talk with your doctor before you start a new exercise. Your doctor may need to adjust your insulin, medicines, or food. Lifestyle   Do not use any tobacco products. These include cigarettes, chewing tobacco, and e-cigarettes.If you need help quitting, ask your doctor.  Ask your doctor how much alcohol is safe for you.  Learn to deal with stress. If you need help with this, ask your doctor. Body care  Stay up to date with your shots (immunizations).  Have your eyes and feet checked by a doctor as often as told.  Check your skin and feet every day. Check for cuts, bruises, redness, blisters, or sores.  Brush your teeth and gums two times a day.  Floss at least one time a day.  Go to the dentist least one time every 6 months.  Stay at a healthy weight. General instructions   Take over-the-counter and prescription medicines only as told by your doctor.  Share your diabetes care plan with: ? Your work or school. ? People you live with.  Check your pee (urine) for ketones: ? When you are sick. ? As told by your doctor.  Carry a card or wear jewelry that says  that you have diabetes.  Ask your doctor: ? Do I need to meet with a diabetes educator? ? Where can I find a support group for people with diabetes?  Keep all follow-up visits as told by your doctor. This is important. Where to find more information: To learn more about diabetes, visit:  American Diabetes Association: www.diabetes.org  American Association of Diabetes Educators: www.diabeteseducator.org/patient-resources  This information is not intended to replace advice given to you by your health care provider. Make sure you discuss any questions you have with your health care provider. Document Released: 10/24/2015 Document Revised: 12/08/2015 Document Reviewed: 08/05/2015 Elsevier Interactive Patient Education  [employer].

## 2017-08-29 NOTE — Progress Notes (Signed)
Subjective  CC:  Chief Complaint  Patient presents with  . Establish Care  . Cough    Patient reports that she has a cough for over a year  . Diabetes  . Hyperlipidemia  . Hypertension    HPI: Marissa Thompson is a 67 y.o. female who presents to the office today for follow up of diabetes and problems listed above in the chief complaint.  Former patient of Dr. Tanya Nones.  I reviewed office notes from the last several years.  She has multiple uncontrolled medical problems.  Diabetes follow up: She has been in uncontrolled diabetic for the last several years.  I have reviewed notes and her medications have not been adjusted much.  She is on insulin and metformin.  She has not been on other oral medications by her report.  Her diet has not been a good diabetic diet however in the last 6 months she has given up soda.  She admits to fatigue and polyuria.  She is in need of a diabetic eye exam.  She declines referral to nutrition diabetic education at this time but will consider in the future.  She is compliant with her medications and checks her sugars daily with fastings averaging 150-300.  Her last A1c was in May 2018 and was greater than 10.  The average A1c is over the years prior was between 8.5 and 10.6. Her diabetic control is reported as Improved.  She is due for a Prevnar.  She is due for a flu shot. She denies exertional CP or SOB or symptomatic hypoglycemia. She denies foot sores or paresthesias.   Hyperlipidemia f/u: Patient presents for follow up of lipids. Lipids have been well controlled on meds w/o myalgias or side effects. Compliance with treatment thus far has been good. The patient does not use medications that may worsen dyslipidemias (corticosteroids, progestins, anabolic steroids, diuretics, beta-blockers, amiodarone, cyclosporine, olanzapine). The patient exercises never. The patient is not known to have coexisting coronary artery disease.   Hypertension f/u: Control is good . Pt  reports she is doing well. taking medications as instructed, no medication side effects noted, no TIAs, no chest pain on exertion, no dyspnea on exertion, no swelling of ankles. She denies adverse effects from his BP medications. Compliance with medication is good.  Hypothyroidism f/u: feels fatigued. Takes meds daily.   Chronic depression: on meds for decades; started on zoloft but more recently started on.  She was taking 150 mg daily but felt very apathetic on that with low motivation.  She decreased her Effexor to 75 mg daily feeling a little better but not completely.  Her depression screen is positive. Depression screen [facility] 2/9 08/29/2017 03/21/2017 12/13/2016 12/10/2013  Decreased Interest 3 0 3 0  Down, Depressed, Hopeless 2 0 2 0  PHQ - 2 Score 5 0 5 0  Altered sleeping 1 - 0 -  Tired, decreased energy 3 - 2 -  Change in appetite 3 - 1 -  Feeling bad or failure about yourself  0 - - -  Trouble concentrating 0 - 0 -  Moving slowly or fidgety/restless 0 - 0 -  Suicidal thoughts 0 - 0 -  PHQ-9 Score 12 - 8 -  Difficult doing work/chores Somewhat difficult - Somewhat difficult -     BP Readings from Last 3 Encounters:  08/29/17 122/80  07/25/17 (!) 114/54  03/21/17 130/60   Wt Readings from Last 3 Encounters:  08/29/17 183 lb (83 kg)  07/25/17 186  lb (84.4 kg)  07/17/17 186 lb 6.4 oz (84.6 kg)    Lab Results  Component Value Date   CHOL 250 (H) 12/13/2016   CHOL 127 03/01/2016   CHOL 193 08/23/2015   Lab Results  Component Value Date   HDL 93 12/13/2016   HDL 81 03/01/2016   HDL 114 08/23/2015   Lab Results  Component Value Date   LDLCALC 119 (H) 12/13/2016   LDLCALC 23 03/01/2016   LDLCALC 60 08/23/2015   Lab Results  Component Value Date   TRIG 189 (H) 12/13/2016   TRIG 116 03/01/2016   TRIG 96 08/23/2015   Lab Results  Component Value Date   CHOLHDL 2.7 12/13/2016   CHOLHDL 1.6 03/01/2016   CHOLHDL 1.7 08/23/2015   No results found for: LDLDIRECT Lab  Results  Component Value Date   CREATININE 0.72 12/13/2016   BUN 9 12/13/2016   NA 137 12/13/2016   K 4.8 12/13/2016   CL 101 12/13/2016   CO2 22 12/13/2016    The 10-year ASCVD risk score Denman George DC Jr., et al., 2013) is: 11.5%   Values used to calculate the score:     Age: 79 years     Sex: Female     Is Non-Hispanic African American: No     Diabetic: Yes     Tobacco smoker: No     Systolic Blood Pressure: 122 mmHg     Is BP treated: Yes     HDL Cholesterol: 93 mg/dL     Total Cholesterol: 250 mg/dL   Immunization History  Administered Date(s) Administered  . Influenza, High Dose Seasonal PF 08/29/2017  . Influenza-Unspecified 03/16/2013  . PPD Test 02/08/2014, 03/09/2015, 10/24/2015, 06/19/2016  . Pneumococcal Conjugate-13 08/29/2017  . Pneumococcal Polysaccharide-23 07/17/2011  . Td 07/17/2003  . Tdap 12/10/2013    Diabetes Related Lab Review: Lab Results  Component Value Date   HGBA1C 9.8 08/29/2017   Lab Results  Component Value Date   MICROALBUR 0.5 12/13/2016   Lab Results  Component Value Date   CREATININE 0.72 12/13/2016   BUN 9 12/13/2016   NA 137 12/13/2016   K 4.8 12/13/2016   CL 101 12/13/2016   CO2 22 12/13/2016   Lab Results  Component Value Date   CHOL 250 (H) 12/13/2016   CHOL 127 03/01/2016   CHOL 193 08/23/2015   Lab Results  Component Value Date   HDL 93 12/13/2016   HDL 81 03/01/2016   HDL 114 08/23/2015   Lab Results  Component Value Date   LDLCALC 119 (H) 12/13/2016   LDLCALC 23 03/01/2016   LDLCALC 60 08/23/2015   Lab Results  Component Value Date   TRIG 189 (H) 12/13/2016   TRIG 116 03/01/2016   TRIG 96 08/23/2015   Lab Results  Component Value Date   CHOLHDL 2.7 12/13/2016   CHOLHDL 1.6 03/01/2016   CHOLHDL 1.7 08/23/2015   No results found for: LDLDIRECT The 10-year ASCVD risk score Denman George DC Jr., et al., 2013) is: 11.5%   Values used to calculate the score:     Age: 75 years     Sex: Female     Is  Non-Hispanic African American: No     Diabetic: Yes     Tobacco smoker: No     Systolic Blood Pressure: 122 mmHg     Is BP treated: Yes     HDL Cholesterol: 93 mg/dL     Total Cholesterol: 250 mg/dL I have reviewed the  PMH, Fam and Soc history. Patient Active Problem List   Diagnosis Date Noted  . Type 2 diabetes mellitus without complication, without long-term current use of insulin (HCC) 03/09/2015    Priority: High  . Memory loss 03/08/2015    Priority: High  . Anxiety     Priority: High  . Depression     Priority: High  . Mixed hyperlipidemia     Priority: High  . Hypothyroidism     Priority: High  . Carpal tunnel syndrome on right 08/29/2016    Priority: Medium  . 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 Dupuytren's contracture of left hand 12/21/2015    Priority: Medium  . Dysphagia, pharyngoesophageal phase 12/29/2014    Priority: Medium  . GERD (gastroesophageal reflux disease)     Priority: Medium  . Vitamin D deficiency 03/03/2014    Priority: Low  . Allergy     Priority: Low  . Benign essential HTN 08/29/2017  . Chronic recurrent major depressive disorder (HCC) 08/29/2017    Social History: Patient  reports that  has never smoked. she has never used smokeless tobacco. She reports that she does not drink alcohol or use drugs.  Review of Systems: Ophthalmic: negative for eye pain, loss of vision or double vision Cardiovascular: negative for chest pain Respiratory: negative for SOB or persistent cough Gastrointestinal: negative for abdominal pain Genitourinary: negative for dysuria or gross hematuria MSK: negative for foot lesions Neurologic: negative for weakness or gait disturbance  Objective  Vitals: BP 122/80   Pulse 90   Temp 99.3 F (37.4 C)   Ht 4\' 11"  (1.499 m)   Wt 183 lb (83 kg)   SpO2 97%   BMI 36.96 kg/m  General: well appearing, no acute distress  Psych:  Alert and oriented, normal mood and affect HEENT:  Normocephalic,  atraumatic, moist mucous membranes, supple neck  Cardiovascular:  Nl S1 and S2, RRR without murmur, gallop or rub. no edema Respiratory:  Good breath sounds bilaterally, CTAB with normal effort, no rales Gastrointestinal: normal BS, soft, nontender Skin:  Warm, no rashes Neurologic:   Mental status is normal. normal gait Foot exam: no erythema, pallor, or cyanosis visible nl proprioception and sensation to monofilament testing bilaterally, +2 distal pulses bilaterally    Assessment  1. Type 2 diabetes mellitus without complication, without long-term current use of insulin (HCC)   2. Mixed hyperlipidemia   3. Acquired hypothyroidism   4. Benign essential HTN   5. Need for influenza vaccination   6. Chronic recurrent major depressive disorder (HCC)      Plan   Diabetes is currently poorly controlled.  We discussed goals of diabetic care and need to get controlled now with the help of more medications.  She will continue to work on diet.  I have asked her to stop Humalog, change Toujeo to nighttime, check fasting sugars daily and 2-hour postprandials after lunch or dinner and bring in the log for recheck in 4 weeks.  Also will add a SGL T-2 inhibitor for better control.  In 1 month, consider adding Januvia.  Continue metformin high-dose.  Updated Pneumovax and flu shot today.  Recheck lipid levels on statin with LFTs  Blood pressure is well controlled on an ACE inhibitor.  Monitor urine.  Check renal function and electrolytes today.  Recheck thyroid levels on medications.  Adjust if not at goal.  Chronic depression, no change in medications at this time.  Some fatigue can be due to  uncontrolled diabetes.  Will discuss in the future after diabetes is better controlled.  May need additional medications or different medications.  Diabetic education: ongoing education regarding chronic disease management for diabetes was given today. We continue to reinforce the ABC's of diabetic management:  A1c (<7 or 8 dependent upon patient), tight blood pressure control, and cholesterol management with goal LDL < 100 minimally. We discuss diet strategies, exercise recommendations, medication options and possible side effects. At each visit, we review recommended immunizations and preventive care recommendations for diabetics and stress that good diabetic control can prevent other problems. See below for this patient's data.  Follow up: Return in about 4 weeks (around 09/26/2017) for follow up Diabetes..   Commons side effects, risks, benefits, and alternatives for medications and treatment plan prescribed today were discussed, and the patient expressed understanding of the given instructions. Patient is instructed to call or message via MyChart if he/she has any questions or concerns regarding our treatment plan. No barriers to understanding were identified. We discussed Red Flag symptoms and signs in detail. Patient expressed understanding regarding what to do in case of urgent or emergency type symptoms.   Medication list was reconciled, printed and provided to the patient in AVS. Patient instructions and summary information was reviewed with the patient as documented in the AVS. This note was prepared with assistance of Dragon voice recognition software. Occasional wrong-word or sound-a-like substitutions may have occurred due to the inherent limitations of voice recognition software  Orders Placed This Encounter  Procedures  . Flu vaccine HIGH DOSE PF (Fluzone High dose)  . Pneumococcal conjugate vaccine 13-valent  . Comprehensive metabolic panel  . Lipid panel  . CBC with Differential/Platelet  . TSH  . POCT glycosylated hemoglobin (Hb A1C)   Meds ordered this encounter  Medications  . venlafaxine XR (EFFEXOR-XR) 75 MG 24 hr capsule    Sig: TAKE 1CAPSULES BY MOUTH DAILY WITH BREAKFAST    Dispense:  60 capsule    Refill:  5  . dapagliflozin propanediol (FARXIGA) 10 MG TABS tablet     Sig: Take 10 mg by mouth daily.    Dispense:  30 tablet    Refill:  5

## 2017-08-30 ENCOUNTER — Encounter: Payer: Self-pay | Admitting: Family Medicine

## 2017-09-05 ENCOUNTER — Other Ambulatory Visit: Payer: Self-pay

## 2017-09-05 ENCOUNTER — Ambulatory Visit: Payer: Medicare Other | Admitting: Family Medicine

## 2017-09-05 ENCOUNTER — Encounter: Payer: Self-pay | Admitting: Family Medicine

## 2017-09-05 VITALS — BP 122/84 | HR 90 | Temp 99.9°F | Ht 59.0 in | Wt 182.6 lb

## 2017-09-05 DIAGNOSIS — E119 Type 2 diabetes mellitus without complications: Secondary | ICD-10-CM

## 2017-09-05 DIAGNOSIS — F419 Anxiety disorder, unspecified: Secondary | ICD-10-CM | POA: Diagnosis not present

## 2017-09-05 DIAGNOSIS — F339 Major depressive disorder, recurrent, unspecified: Secondary | ICD-10-CM | POA: Diagnosis not present

## 2017-09-05 DIAGNOSIS — Z0289 Encounter for other administrative examinations: Secondary | ICD-10-CM

## 2017-09-05 LAB — CBC WITH DIFFERENTIAL/PLATELET
Basophils Absolute: 0 10*3/uL (ref 0.0–0.1)
Basophils Relative: 0.7 % (ref 0.0–3.0)
EOS PCT: 1.1 % (ref 0.0–5.0)
Eosinophils Absolute: 0.1 10*3/uL (ref 0.0–0.7)
HCT: 38.7 % (ref 36.0–46.0)
HEMOGLOBIN: 12.3 g/dL (ref 12.0–15.0)
LYMPHS ABS: 1.6 10*3/uL (ref 0.7–4.0)
Lymphocytes Relative: 22.9 % (ref 12.0–46.0)
MCHC: 31.8 g/dL (ref 30.0–36.0)
MCV: 77.3 fl — AB (ref 78.0–100.0)
MONOS PCT: 7.2 % (ref 3.0–12.0)
Monocytes Absolute: 0.5 10*3/uL (ref 0.1–1.0)
Neutro Abs: 4.8 10*3/uL (ref 1.4–7.7)
Neutrophils Relative %: 68.1 % (ref 43.0–77.0)
Platelets: 244 10*3/uL (ref 150.0–400.0)
RBC: 5.01 Mil/uL (ref 3.87–5.11)
RDW: 17.8 % — ABNORMAL HIGH (ref 11.5–15.5)
WBC: 7.1 10*3/uL (ref 4.0–10.5)

## 2017-09-05 LAB — LIPID PANEL
CHOLESTEROL: 139 mg/dL (ref 0–200)
HDL: 65.1 mg/dL (ref >39.00)
LDL Cholesterol: 46 mg/dL (ref 0–99)
NONHDL: 73.82
Total CHOL/HDL Ratio: 2
Triglycerides: 138 mg/dL (ref 0.0–149.0)
VLDL: 27.6 mg/dL (ref 0.0–40.0)

## 2017-09-05 LAB — COMPREHENSIVE METABOLIC PANEL
ALBUMIN: 3.8 g/dL (ref 3.5–5.2)
ALK PHOS: 111 U/L (ref 39–117)
ALT: 17 U/L (ref 0–35)
AST: 20 U/L (ref 0–37)
BUN: 7 mg/dL (ref 6–23)
CALCIUM: 9.1 mg/dL (ref 8.4–10.5)
CO2: 28 mEq/L (ref 19–32)
Chloride: 100 mEq/L (ref 96–112)
Creatinine, Ser: 0.65 mg/dL (ref 0.40–1.20)
GFR: 97.11 mL/min (ref >60.00)
GLUCOSE: 272 mg/dL — AB (ref 70–99)
POTASSIUM: 4.6 meq/L (ref 3.5–5.1)
Sodium: 136 mEq/L (ref 135–145)
TOTAL PROTEIN: 6.6 g/dL (ref 6.0–8.3)
Total Bilirubin: 0.4 mg/dL (ref 0.2–1.2)

## 2017-09-05 LAB — TSH: TSH: 0.01 u[IU]/mL — ABNORMAL LOW (ref 0.35–4.50)

## 2017-09-05 MED ORDER — PAROXETINE HCL 20 MG PO TABS
20.0000 mg | ORAL_TABLET | Freq: Every day | ORAL | 2 refills | Status: DC
Start: 2017-09-05 — End: 2017-09-05

## 2017-09-05 MED ORDER — VENLAFAXINE HCL ER 75 MG PO CP24: ORAL_CAPSULE | ORAL | 5 refills | Status: DC

## 2017-09-05 MED ORDER — CLONAZEPAM 0.5 MG PO TABS: 0.5000 mg | ORAL_TABLET | Freq: Two times a day (BID) | ORAL | 0 refills | Status: DC | PRN

## 2017-09-05 MED ORDER — CLONAZEPAM 0.5 MG PO TABS
0.5000 mg | ORAL_TABLET | Freq: Two times a day (BID) | ORAL | 0 refills | Status: DC | PRN
Start: 2017-09-05 — End: 2017-09-18

## 2017-09-05 MED ORDER — PAROXETINE HCL 20 MG PO TABS: 20.0000 mg | ORAL_TABLET | Freq: Every day | ORAL | 2 refills | Status: DC

## 2017-09-05 NOTE — Progress Notes (Signed)
Subjective  CC:  Chief Complaint  Patient presents with  . Anxiety    HPI: Marissa Thompson is a 67 y.o. female who presents to the office today to address the problems listed above in the chief complaint, mood problems. Pt was first diagnosed with depression in 2004 and started on zoloft; it was helpful, but overtime it seemed to stop working. About 4 years ago, she was changed to effexor but she became apathetic; so she decreased the dose to 75mg  (1/2) herself. She did well on that dose until about a month ago; now feels like she is in a fog . Current symptoms include depressed mood,anhedonia,fatigue,hopelessness,anxiety,loss of energy/fatigue, disturbed sleep,  and have been present for about a month. These symptoms have been progressive in nature.  She denies current suicidal or homicidal plan or intent.   [facility] 09/05/2017 08/29/2017 03/21/2017  Decreased Interest 3 3 0  Down, Depressed, Hopeless 2 2 0  PHQ - 2 Score 5 5 0  Altered sleeping 3 1 -  Tired, decreased energy 3 3 -  Change in appetite 3 3 -  Feeling bad or failure about yourself  1 0 -  Trouble concentrating 3 0 -  Moving slowly or fidgety/restless 1 0 -  Suicidal thoughts 0 0 -  PHQ-9 Score 19 12 -  Difficult doing work/chores Extremely dIfficult Somewhat difficult -   Previously on prescription medications for mood/anxiety: only zoloft Therapist/counseling: once years ago.  Previous Diagnosis of psychiatric disorder: depression Family history of psychiatric disorder: no biploar  F/u diabetes: checking sugars, stopped humalog but hasn't yet picked up the new SGLT-2 inhibitor. Due to low motivation and depression sxs as noted above. Sugars are doing ok in am but remain very elevated in afternoons/evenings.   Left last visit w/o getting blood drawn for lab work.   I reviewed the patients updated PMH, FH, and SocHx.    Patient Active Problem List   Diagnosis Date Noted  . Benign essential HTN  08/29/2017    Priority: High  . Chronic recurrent major depressive disorder (HCC) 08/29/2017    Priority: High  . Type 2 diabetes mellitus without complication, without long-term current use of insulin (HCC) 03/09/2015    Priority: High  . Memory loss 03/08/2015    Priority: High  . Anxiety     Priority: High  . Mixed hyperlipidemia     Priority: High  . Hypothyroidism     Priority: High  . Carpal tunnel syndrome on right 08/29/2016    Priority: Medium  . 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 Dupuytren's contracture of left hand 12/21/2015    Priority: Medium  . Dysphagia, pharyngoesophageal phase 12/29/2014    Priority: Medium  . GERD (gastroesophageal reflux disease)     Priority: Medium  . Vitamin D deficiency 03/03/2014    Priority: Low  . Allergy     Priority: Low   Current Meds  Medication Sig  . aspirin EC 81 MG tablet Take 81 mg by mouth daily.  . cyclobenzaprine (FLEXERIL) 10 MG tablet TAKE 1 TABLET BY MOUTH THREE TIMES A DAY AS NEEDED FOR MUSCLE SPASMS  . gabapentin (NEURONTIN) 600 MG tablet Take 2 tablets (1,200 mg total) by mouth 3 (three) times daily.  Marland Kitchen. ibuprofen (ADVIL,MOTRIN) 800 MG tablet Take 800 mg by mouth 4 (four) times daily as needed.  . Insulin Pen Needle (CAREFINE PEN NEEDLES) 32G X 4 MM MISC Use as directed  .  Insulin Pen Needle (FIFTY50 PEN NEEDLES) 32G X 4 MM MISC Use as directed  . levothyroxine (SYNTHROID, LEVOTHROID) 150 MCG tablet Take 1 tablet (150 mcg total) by mouth daily.  Marland Kitchen lisinopril (PRINIVIL,ZESTRIL) 20 MG tablet Take 1 tablet (20 mg total) by mouth daily.  . metFORMIN (GLUCOPHAGE) 1000 MG tablet Take 1 tablet (1,000 mg total) by mouth 2 (two) times daily with a meal.  . pantoprazole (PROTONIX) 40 MG tablet Take 1 tablet (40 mg total) by mouth daily.  . pravastatin (PRAVACHOL) 40 MG tablet TAKE 1 TABLET BY MOUTH DAILY WITH SUPPER  . TOUJEO SOLOSTAR 300 UNIT/ML SOPN INJECT 40 UNITS INTO THE SKIN DAILY  . venlafaxine XR  (EFFEXOR-XR) 75 MG 24 hr capsule Take 1 capsule (75 mg total) by mouth every other day for 7 days, THEN 1 capsule (75 mg total) 3 (three) times a week for 7 days.  . [DISCONTINUED] venlafaxine XR (EFFEXOR-XR) 75 MG 24 hr capsule TAKE 1CAPSULES BY MOUTH DAILY WITH BREAKFAST    Allergies: Patient is allergic to toradol [ketorolac tromethamine]; baclofen; morphine and related; penicillins; and talwin [pentazocine]. Family history:  Patient family history includes Alcohol abuse in her brother and father; Arthritis in her father; Cancer (age of onset: 92) in her father; Depression in her mother; Diabetes in her mother; Hypertension in her mother. Social History   Socioeconomic History  . Marital status: Married    Spouse name: None  . Number of children: 1  . Years of education: None  . Highest education level: None  Social Needs  . Financial resource strain: None  . Food insecurity - worry: None  . Food insecurity - inability: None  . Transportation needs - medical: None  . Transportation needs - non-medical: None  Occupational History  . Occupation: Nurse  Tobacco Use  . Smoking status: Never Smoker  . Smokeless tobacco: Never Used  Substance and Sexual Activity  . Alcohol use: No    Alcohol/week: 0.0 oz  . Drug use: No  . Sexual activity: Not Currently  Other Topics Concern  . None  Social History Narrative   Separated. Son lives with her.    On Disability--secondary to back. On disability since 2012.   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    Never smoked.      Review of Systems: Constitutional: Negative for fever malaise or anorexia Cardiovascular: negative for chest pain Respiratory: negative for SOB or persistent cough Gastrointestinal: negative for abdominal pain  Objective  Vitals: BP 122/84   Pulse 90   Temp 99.9 F (37.7 C)   Ht 4\' 11"  (1.499 m)   Wt 182 lb  9.6 oz (82.8 kg)   BMI 36.88 kg/m  General: no acute distress, well appearing, no apparent distress, well groomed Psych:  Alert and oriented x 3,depressed. anxiety Cardiovascular:  RRR without murmur or gallop. no peripheral edema Respiratory:  Good breath sounds bilaterally, CTAB with normal respiratory effort Skin:  Warm, no rashes   Assessment  1. Chronic recurrent major depressive disorder (HCC)   2. Type 2 diabetes mellitus without complication, without long-term current use of insulin (HCC)   3. Anxiety      Plan   Depression:  With anxiety: wean from effexor due to ineffectiveness; change to paxil. Educated on care for depression and anxiety. Klonopin low dose as needed to help with sleep and anxiety sxs while changing meds.  Close f/u. Return in 6 weeks (pt going to [place] for a few weeks at the end of march).  Reviewed concept of mood problems caused by biochemical imbalance of neurotransmitters and rationale for treatment with medications and therapy.   Counseling given: pt was instructed to contact office, on-call physician or crisis Hotline if symptoms worsen significantly. If patient develops any suicidal or homicidal thoughts, she is directed to the ER immediately.   F/u diabetes: need to start the new diabetes medication. Reinforced need to go get medication today.   Today will get blood work done: lipids, tsh, cmp cbc.  Follow up: 6 weeks to recheck diabetes and mood.    Commons side effects, risks, benefits, and alternatives for medications and treatment plan prescribed today were discussed, and the patient expressed understanding of the given instructions. Patient is instructed to call or message via MyChart if he/she has any questions or concerns regarding our treatment plan. No barriers to understanding were identified. We discussed Red Flag symptoms and signs in detail. Patient expressed understanding regarding what to do in case of urgent or emergency type  symptoms.   Medication list was reconciled, printed and provided to the patient in AVS. Patient instructions and summary information was reviewed with the patient as documented in the AVS. This note was prepared with assistance of Dragon voice recognition software. Occasional wrong-word or sound-a-like substitutions may have occurred due to the inherent limitations of voice recognition software  No orders of the defined types were placed in this encounter.  Meds ordered this encounter  Medications  . DISCONTD: PARoxetine (PAXIL) 20 MG tablet    Sig: Take 1 tablet (20 mg total) by mouth daily.    Dispense:  30 tablet    Refill:  2  . venlafaxine XR (EFFEXOR-XR) 75 MG 24 hr capsule    Sig: Take 1 capsule (75 mg total) by mouth every other day for 7 days, THEN 1 capsule (75 mg total) 3 (three) times a week for 7 days.    Dispense:  60 capsule    Refill:  5  . DISCONTD: clonazePAM (KLONOPIN) 0.5 MG tablet    Sig: Take 1 tablet (0.5 mg total) by mouth 2 (two) times daily as needed for anxiety.    Dispense:  20 tablet    Refill:  0  . PARoxetine (PAXIL) 20 MG tablet    Sig: Take 1 tablet (20 mg total) by mouth daily.    Dispense:  30 tablet    Refill:  2  . clonazePAM (KLONOPIN) 0.5 MG tablet    Sig: Take 1 tablet (0.5 mg total) by mouth 2 (two) times daily as needed for anxiety.    Dispense:  20 tablet    Refill:  0

## 2017-09-05 NOTE — Patient Instructions (Addendum)
Please return in April for recheck of your mood and sugars.  Please start your new diabetes medication.   If you have any questions or concerns, please don't hesitate to send me a message via MyChart or call the office at [phone]. Thank you for visiting with us today! It's our pleasure caring for you.  Please take your Effexor every other day for one week, Then M, W, F for a week, Then start paxil nightly.   Depression Medications:  Taking the medicine as directed and not missing any doses is one of the best things you can do to treat your depression.  Here are some things to keep in mind:  1) Side effects (stomach upset, some increased anxiety) may happen before you notice a benefit.  These side effects typically go away over time. 2) Changes to your dose of medicine or a change in medication all together is sometimes necessary 3) Most people need to be on medication at least 6-12 months 4) Many people will notice an improvement within two weeks but the full effect of the medication can take up to 4-6 weeks 5) Stopping the medication when you start feeling better often results in a return of symptoms 6) If you start having thoughts of hurting yourself or others after starting this medicine, please call the office immediately at [phone].     When you're feeling too down to do anything, try these 10 Little things!  Take a shower. Even if you plan to stay in all day long and not see a soul, take a shower. It takes the most effort to hop in to the shower but once you do, you'll feel immediate results. It will wake you up and you'll be feeling much fresher (and cleaner too).  Brush and floss your teeth. Give your teeth a good brushing with a floss finish. It's a small task but it feels so good and you can check 'taking care of your health' off the list of things to do.  Do something small on your list. Most of us have some small thing we would like to get done (load of laundry,  sew a button, email a friend). Doing one of these things will make you feel like you've accomplished something.  Drink water. Drinking water is easy right? It's also really beneficial for your health so keep a glass beside you all day and take sips often. It gives you energy and prevents you from boredom eating.  Do some floor exercises. The last thing you want to do is exercise but it might be just the thing you need the most. Keep it simple and do exercises that involve sitting or laying on the floor. Even the smallest of exercises release chemicals in the brain that make you feel good. Yoga stretches or core exercises are going to make you feel good with minimal effort.  Make your bed. Making your bed takes a few minutes but it's productive and you'll feel relieved when it's done. An unmade bed is a huge visual reminder that you're having an unproductive day. Do it and consider it your housework for the day.  Put on some nice clothes. Take the sweatpants off even if you don't plan to go anywhere. Put on clothes that make you feel good. Take a look in the mirror so your brain recognizes the sweatpants have been replaced with clothes that make you look great. It's an instant confidence booster.  Wash the dishes. A pile of dirty dishes in  the sink is a reflection of your mood. It's possible that if you wash up the dishes, your mood will follow suit. It's worth a try.  Cook a real meal. If you have the luxury to have a "do nothing" day, you have time to make a real meal for yourself. Make a meal that you love to eat. The process is good to get you out of the funk and the food will ensure you have more energy for tomorrow.  Write out your thoughts by hand. When you hand write, you stimulate your brain to focus on the moment that you're in so make yourself comfortable and write whatever comes into your mind. Put those thoughts out on paper so they stop spinning around in your head. Those thoughts  might be the very thing holding you down.

## 2017-09-06 NOTE — Progress Notes (Signed)
Spoke with Patient regarding lab results,                                                                                   Patient verbalized understanding  Kathi Simpersmy Peterman, LPN

## 2017-09-06 NOTE — Progress Notes (Signed)
Spoke with Patient regarding lab results,                                                                                   Patient verbalized understanding  Fronie Holstein, LPN  

## 2017-09-06 NOTE — Progress Notes (Signed)
Spoke with Patient regarding lab results.                                                                                  Patient verbalized understanding  Gursimran Litaker, LPN  

## 2017-09-06 NOTE — Progress Notes (Signed)
Spoke with Patient regarding lab results.                                                                                  Patient verbalized understanding  Marissa SimpersAmy Peterman, LPN

## 2017-09-10 ENCOUNTER — Encounter: Payer: Medicare Other | Admitting: Gastroenterology

## 2017-09-13 ENCOUNTER — Telehealth: Payer: Self-pay | Admitting: Family Medicine

## 2017-09-13 ENCOUNTER — Other Ambulatory Visit: Payer: Self-pay | Admitting: Emergency Medicine

## 2017-09-13 MED ORDER — LEVOTHYROXINE SODIUM 137 MCG PO TABS: 150.0000 ug | ORAL_TABLET | Freq: Every day | ORAL | 3 refills | Status: DC

## 2017-09-13 NOTE — Telephone Encounter (Signed)
Copied from CRM [id number]. Topic: Quick Communication - Rx Refill/Question >> Sep 13, 2017 11:33 AM Cipriano BunkerLambe, Annette S wrote: Medication:  levothyroxine (SYNTHROID, LEVOTHROID) 150 MCG tablet - wrong dosage  Pt. Thyroid was low at last visit and the office was to send prescription in for lower dosage.  Called in for same dosage of 150 and went to wrong pharmacy    Has the patient contacted their pharmacy? Yes.     Preferred Pharmacy (with phone number or street name):   [id number] [place], [place] - [address] [place] [address] Phone: [phone] Fax: [phone]     Agent: Please be advised that RX refills may take up to 3 business days. We ask that you follow-up with your pharmacy.

## 2017-09-13 NOTE — Telephone Encounter (Signed)
Patient called stating wrong dosage was sent to pharmacy, dosage corrected and [facility], Patient informed and verbalized understanding.. AP, LPN

## 2017-09-13 NOTE — Telephone Encounter (Signed)
  Pt. TSH level was low at last visit. Pt states office was to send prescription in for lower dosage of Synthroid. Called in same dosage of 150 MCG . Also went to wrong pharmacy.  Levothyroxine 150 MCG tablet - wrong dosage?   Preferred Pharmacy (with phone number or street name):   [phone] [place], [place] - [address] [place] [address] Phone: [phone] Fax: [phone]

## 2017-09-18 ENCOUNTER — Other Ambulatory Visit: Payer: Self-pay | Admitting: Family Medicine

## 2017-09-19 ENCOUNTER — Other Ambulatory Visit: Payer: Self-pay | Admitting: Family Medicine

## 2017-09-23 ENCOUNTER — Ambulatory Visit: Payer: Medicare Other | Admitting: Family Medicine

## 2017-09-24 ENCOUNTER — Other Ambulatory Visit: Payer: Self-pay | Admitting: Orthopaedic Surgery

## 2017-09-24 DIAGNOSIS — M542 Cervicalgia: Secondary | ICD-10-CM

## 2017-09-30 ENCOUNTER — Other Ambulatory Visit: Payer: Self-pay | Admitting: Emergency Medicine

## 2017-09-30 DIAGNOSIS — E119 Type 2 diabetes mellitus without complications: Secondary | ICD-10-CM

## 2017-09-30 MED ORDER — INSULIN GLARGINE 300 UNIT/ML ~~LOC~~ SOPN: 40.0000 [IU] | PEN_INJECTOR | Freq: Every day | SUBCUTANEOUS | 0 refills | Status: DC

## 2017-10-02 ENCOUNTER — Telehealth: Payer: Self-pay | Admitting: Family Medicine

## 2017-10-02 NOTE — Telephone Encounter (Signed)
Copied from CRM [phone]. Topic: Quick Communication - See Telephone Encounter >> Oct 02, 2017  1:35 PM Eston Mouldavis, Duc Crocket B wrote: CRM for notification. See Telephone encounter for:  PT was prescribed dapagliflozin propanediol 10 MG Tabs tablet  Commonly known as: FARXIGA  Take 10 mg by mouth daily.  And she states it is not affordable and wants to know if she can get a rx from another medication.  She is leaving Friday until April 2nd    [facility] [address] [place], [place] - [address] [phone] (Phone) [phone] (Fax)      10/02/17.

## 2017-10-03 ENCOUNTER — Other Ambulatory Visit: Payer: Self-pay | Admitting: Family Medicine

## 2017-10-03 NOTE — Telephone Encounter (Signed)
LM to  RC, CRM Created  

## 2017-10-03 NOTE — Telephone Encounter (Signed)
Pt has changed PCP's - medication denied.

## 2017-10-03 NOTE — Telephone Encounter (Signed)
Patient is going to call her insurance company regarding which medication will be covered. She is going to call us back with a good pharmacy as well.   Patient verbalized understanding   AP, LPN

## 2017-10-03 NOTE — Telephone Encounter (Signed)
Please call patient to find out how much farxiga will cost? Did she use the coupon.  Which is the preferred for her insurance: invokana, jardiance, farxiga or steglatro. We have coupons for all of them. We should be able to get this class covered for her but doubt by tomorrow.   She can [place] and then letting us know. I can then order the medication through a [place] [facility].

## 2017-10-09 ENCOUNTER — Other Ambulatory Visit: Payer: Self-pay | Admitting: Family Medicine

## 2017-10-10 ENCOUNTER — Other Ambulatory Visit: Payer: Self-pay | Admitting: Family Medicine

## 2017-10-10 NOTE — Telephone Encounter (Signed)
Ok to refill 

## 2017-10-10 NOTE — Telephone Encounter (Signed)
Called patient and left a detailed message on her VM to let her know to use this medication (Klonopin) only as needed per Dr. Mardelle MatteAndy and no refills before her next visit with us in April.

## 2017-10-10 NOTE — Telephone Encounter (Signed)
No further refills until after her next office visit. Should be on paxil and using klonopin only as needed, not daily. Please notify pt.

## 2017-10-15 ENCOUNTER — Telehealth: Payer: Self-pay | Admitting: Family Medicine

## 2017-10-15 MED ORDER — EMPAGLIFLOZIN 10 MG PO TABS: 10.0000 mg | ORAL_TABLET | Freq: Every day | ORAL | 5 refills | Status: DC

## 2017-10-15 NOTE — Telephone Encounter (Signed)
See note per [employer] Jardiance is cheaper than [place]iga

## 2017-10-15 NOTE — Telephone Encounter (Signed)
Ordered jardiance 10

## 2017-10-15 NOTE — Telephone Encounter (Signed)
Copied from CRM [id number]. Topic: Quick Communication - See Telephone Encounter >> Oct 15, 2017  1:52 PM Landry MellowFoltz, Melissa J wrote: CRM for notification. See Telephone encounter for: 10/15/17. Med  dapagliflozin propanediol (FARXIGA) 10 MG TABS tablet - is too expensive =- pt is asking for jardiance. Her ins company says it is a lot less. - pharm -[id number]- [place], [place] - [address] [place] is [phone]

## 2017-10-15 NOTE — Telephone Encounter (Signed)
Patient called her insurance and says [place] Pepper[place]ardiance is cheaper than [place]iga.  [facility]armacy[facility] [id number] [place], [place] - [address] [phone] (Phone) [phone] (Fax)

## 2017-10-17 ENCOUNTER — Ambulatory Visit: Payer: Medicare Other | Admitting: Family Medicine

## 2017-10-17 ENCOUNTER — Encounter: Payer: Self-pay | Admitting: Family Medicine

## 2017-10-17 ENCOUNTER — Other Ambulatory Visit: Payer: Self-pay

## 2017-10-17 VITALS — BP 142/80 | HR 79 | Temp 98.9°F | Ht 59.0 in | Wt 182.8 lb

## 2017-10-17 DIAGNOSIS — E119 Type 2 diabetes mellitus without complications: Secondary | ICD-10-CM

## 2017-10-17 DIAGNOSIS — F339 Major depressive disorder, recurrent, unspecified: Secondary | ICD-10-CM | POA: Diagnosis not present

## 2017-10-17 DIAGNOSIS — Z794 Long term (current) use of insulin: Secondary | ICD-10-CM | POA: Diagnosis not present

## 2017-10-17 DIAGNOSIS — F419 Anxiety disorder, unspecified: Secondary | ICD-10-CM

## 2017-10-17 DIAGNOSIS — E039 Hypothyroidism, unspecified: Secondary | ICD-10-CM | POA: Diagnosis not present

## 2017-10-17 DIAGNOSIS — I10 Essential (primary) hypertension: Secondary | ICD-10-CM | POA: Diagnosis not present

## 2017-10-17 LAB — POCT GLYCOSYLATED HEMOGLOBIN (HGB A1C): Hemoglobin A1C: 7.8

## 2017-10-17 LAB — TSH: TSH: 0.02 u[IU]/mL — AB (ref 0.35–4.50)

## 2017-10-17 NOTE — Progress Notes (Signed)
Subjective  CC:  Chief Complaint  Patient presents with  . Diabetes    Patient has been checking sugars 2x per day, patient has sugar log    HPI: Marissa Thompson is a 67 y.o. female who presents to the office today for follow up of diabetes and problems listed above in the chief complaint. DM, depression, hypothyroidism  Diabetes follow up: Her diabetic control is reported as Improved. shes been checking sugars twice daily and fastings have been tredning downwards. She took the farxiga x 4 weeks but then was out of it for last 4-6 weeks; just changed to Lymanjarciance for cost reasons. Tolerating it well.  She denies exertional CP or SOB or symptomatic hypoglycemia. She denies foot sores or paresthesias.   Hypothyroidism f/u: decreased synthroid dose ot 137mcg due to over treated hypothyroidism. Doing fine on lower dose x 4 weeks.   Depression/anxiety f/u: weaned off effexor and now on paxil 20 daily with improvement in mood, less anxiety and no longer feeling "foggy". Was using klonopin at night to help with anxiety sxs but was getting confused. Stopped it last week. No AE from paxil.   BP is doing fine. Lipids are well controlled.   HM- due for CPE. AWV.  Lab Results  Component Value Date   TSH 0.01 (L) 09/05/2017     Immunization History  Administered Date(s) Administered  . Influenza, High Dose Seasonal PF 08/29/2017  . Influenza-Unspecified 03/16/2013  . PPD Test 02/08/2014, 03/09/2015, 10/24/2015, 06/19/2016  . Pneumococcal Conjugate-13 08/29/2017  . Pneumococcal Polysaccharide-23 07/17/2011  . Td 07/17/2003  . Tdap 12/10/2013    Diabetes Related Lab Review: Lab Results  Component Value Date   HGBA1C 7.8 10/17/2017   HGBA1C 9.8 08/29/2017   HGBA1C 10.6 (H) 12/13/2016    Lab Results  Component Value Date   MICROALBUR 0.5 12/13/2016   Lab Results  Component Value Date   CREATININE 0.65 09/05/2017   BUN 7 09/05/2017   NA 136 09/05/2017   K 4.6 09/05/2017   CL  100 09/05/2017   CO2 28 09/05/2017   Lab Results  Component Value Date   CHOL 139 09/05/2017   CHOL 250 (H) 12/13/2016   CHOL 127 03/01/2016   Lab Results  Component Value Date   HDL 65.10 09/05/2017   HDL 93 12/13/2016   HDL 81 03/01/2016   Lab Results  Component Value Date   LDLCALC 46 09/05/2017   LDLCALC 119 (H) 12/13/2016   LDLCALC 23 03/01/2016   Lab Results  Component Value Date   TRIG 138.0 09/05/2017   TRIG 189 (H) 12/13/2016   TRIG 116 03/01/2016   Lab Results  Component Value Date   CHOLHDL 2 09/05/2017   CHOLHDL 2.7 12/13/2016   CHOLHDL 1.6 03/01/2016   No results found for: LDLDIRECT The 10-year ASCVD risk score Denman George(Goff DC Jr., et al., 2013) is: 13.2%   Values used to calculate the score:     Age: 67 years     Sex: Female     Is Non-Hispanic African American: No     Diabetic: Yes     Tobacco smoker: No     Systolic Blood Pressure: 142 mmHg     Is BP treated: Yes     HDL Cholesterol: 65.1 mg/dL     Total Cholesterol: 139 mg/dL I have reviewed the PMH, Fam and Soc history. Patient Active Problem List   Diagnosis Date Noted  . Benign essential HTN 08/29/2017    Priority: High  .  Chronic recurrent major depressive disorder (HCC) 08/29/2017    Priority: High  . Type 2 diabetes mellitus without complications (HCC) 03/09/2015    Priority: High  . Memory loss 03/08/2015    Priority: High  . Anxiety     Priority: High  . Mixed hyperlipidemia     Priority: High  . Hypothyroidism     Priority: High  . Carpal tunnel syndrome on right 08/29/2016    Priority: Medium  . 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 Dupuytren's contracture of left hand 12/21/2015    Priority: Medium  . Dysphagia, pharyngoesophageal phase 12/29/2014    Priority: Medium  . GERD (gastroesophageal reflux disease)     Priority: Medium  . Vitamin D deficiency 03/03/2014    Priority: Low  . Allergy     Priority: Low    Social History: Patient  reports that she  has never smoked. She has never used smokeless tobacco. She reports that she does not drink alcohol or use drugs.  Review of Systems: Ophthalmic: negative for eye pain, loss of vision or double vision Cardiovascular: negative for chest pain Respiratory: negative for SOB or persistent cough Gastrointestinal: negative for abdominal pain Genitourinary: negative for dysuria or gross hematuria MSK: negative for foot lesions Neurologic: negative for weakness or gait disturbance  Objective  Vitals: BP (!) 142/80   Pulse 79   Temp 98.9 F (37.2 C)   Ht 4\' 11"  (1.499 m)   Wt 182 lb 12.8 oz (82.9 kg)   BMI 36.92 kg/m  General: well appearing, no acute distress  Psych:  Alert and oriented, normal mood and affect HEENT:  Normocephalic, atraumatic, moist mucous membranes, supple neck  Cardiovascular:  Nl S1 and S2, RRR without murmur, gallop or rub. no edema Respiratory:  Good breath sounds bilaterally, CTAB with normal effort, no rales Neurologic:   Mental status is normal. normal gait. Fine tremor  Assessment  1. Type 2 diabetes mellitus without complication, with long-term current use of insulin (HCC)   2. Chronic recurrent major depressive disorder (HCC)   3. Anxiety   4. Benign essential HTN   5. Acquired hypothyroidism      Plan   Diabetes is currently with improved controlled. Start jardiance, continue improved eating, continue lantus 40 nightly and metformin 1000bid. Discussed risk of genital mycotic infections and treatment options. Recheck 3 months. She has eye exam scheduled.   Depression is improved. Continue paxil and reassess in 3 months. No more benzo's needed.   BP - fair control. Recheck 3 months on jardiance. Should help.   Recheck thyroid function on decreased dose of meds.  Diabetic education: ongoing education regarding chronic disease management for diabetes was given today. We continue to reinforce the ABC's of diabetic management: A1c (<7 or 8 dependent upon  patient), tight blood pressure control, and cholesterol management with goal LDL < 100 minimally. We discuss diet strategies, exercise recommendations, medication options and possible side effects. At each visit, we review recommended immunizations and preventive care recommendations for diabetics and stress that good diabetic control can prevent other problems. See below for this patient's data.  Follow up: Return in about 3 months (around 01/16/2018) for follow up Diabetes, complete physical..   Commons side effects, risks, benefits, and alternatives for medications and treatment plan prescribed today were discussed, and the patient expressed understanding of the given instructions. Patient is instructed to call or message via MyChart if he/she has any questions or concerns regarding our treatment plan.  No barriers to understanding were identified. We discussed Red Flag symptoms and signs in detail. Patient expressed understanding regarding what to do in case of urgent or emergency type symptoms.   Medication list was reconciled, printed and provided to the patient in AVS. Patient instructions and summary information was reviewed with the patient as documented in the AVS. This note was prepared with assistance of Dragon voice recognition software. Occasional wrong-word or sound-a-like substitutions may have occurred due to the inherent limitations of voice recognition software  Orders Placed This Encounter  Procedures  . TSH  . POCT glycosylated hemoglobin (Hb A1C)   No orders of the defined types were placed in this encounter.

## 2017-10-17 NOTE — Patient Instructions (Signed)
Please return in 3 months for your annual complete physical; please come fasting. Please schedule an eye appointment and send me the results.   I will release your lab results to you on your MyChart account with further instructions. Please reply with any questions.    If you have any questions or concerns, please don't hesitate to send me a message via MyChart or call the office at [phone]. Thank you for visiting with Marissa Thompson today! It's our pleasure caring for you.

## 2017-10-18 ENCOUNTER — Ambulatory Visit
Admission: RE | Admit: 2017-10-18 | Discharge: 2017-10-18 | Disposition: A | Discharge status: Home / Self Care | Payer: Medicare Other | Source: Ambulatory Visit | Attending: Orthopaedic Surgery | Admitting: Orthopaedic Surgery

## 2017-10-18 DIAGNOSIS — M542 Cervicalgia: Secondary | ICD-10-CM

## 2017-10-18 MED ORDER — LEVOTHYROXINE SODIUM 125 MCG PO TABS: 125.0000 ug | ORAL_TABLET | Freq: Every day | ORAL | 2 refills | Status: DC

## 2017-10-18 MED ORDER — LEVOTHYROXINE SODIUM 137 MCG PO TABS: 150.0000 ug | ORAL_TABLET | Freq: Every day | ORAL | 3 refills | Status: DC

## 2017-10-18 NOTE — Addendum Note (Signed)
Addended by: Asencion PartridgeANDY, Armine Rizzolo on: 10/18/2017 09:38 AM   Modules accepted: Orders

## 2017-10-27 ENCOUNTER — Other Ambulatory Visit: Payer: Self-pay | Admitting: Family Medicine

## 2017-10-27 DIAGNOSIS — E119 Type 2 diabetes mellitus without complications: Secondary | ICD-10-CM

## 2017-11-02 ENCOUNTER — Other Ambulatory Visit: Payer: Self-pay | Admitting: Family Medicine

## 2017-11-04 NOTE — Telephone Encounter (Signed)
Need to assess need for tid mm relaxer at f/u visit. No further refills w/o clarification.

## 2017-11-06 ENCOUNTER — Telehealth: Payer: Self-pay | Admitting: Family Medicine

## 2017-11-06 NOTE — Telephone Encounter (Signed)
Copied from CRM [id number]. Topic: Quick Communication - See Telephone Encounter >> Nov 06, 2017 10:18 AM Lorrine KinMcGee, Mikhi Athey B, NT wrote: CRM for notification. See Telephone encounter for: 11/06/17. Patient calling and states she is having surgery on Nov 19, 2017 on her neck. States that the Dr Joyce GrossKowen sent over a fax asking if patient could stop taking the asprin before her apointment. Please advise. CB#: [phone]

## 2017-11-06 NOTE — Telephone Encounter (Signed)
See phone note and advise.   Kathi SimpersAmy Clotine Heiner,  LPN

## 2017-11-07 NOTE — Telephone Encounter (Signed)
Patient informed and verbalized understanding.   Falana Clagg,  LPN   

## 2017-11-07 NOTE — Telephone Encounter (Signed)
Yes, she may stop the aspirin prior to surgery based on his instructions. May restart afterwards when he feels it is safe. thanks

## 2017-11-16 IMAGING — RF DG SWALLOWING FUNCTION
1 series · 17 of 24 positions shown · non-contrast
Comparison: None.

CLINICAL DATA: Dysphagia.

EXAM:
MODIFIED BARIUM SWALLOW
TECHNIQUE: Different consistencies of barium were administered orally to the
patient by the Speech Pathologist. Imaging of the pharynx was
performed in the lateral projection.
FLUOROSCOPY TIME:  Radiation Exposure Index (as provided by the
fluoroscopic device):
If the device does not provide the exposure index:
Fluoroscopy Time:  1 minute, 53 seconds
Number of Acquired Images:  0

[Series 1: run · 22 acquisitions, 17 frames shown]
[im 1/22]
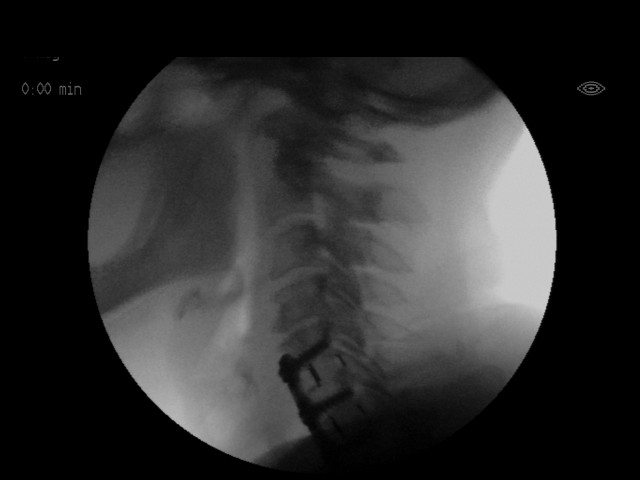
[im 2/22]
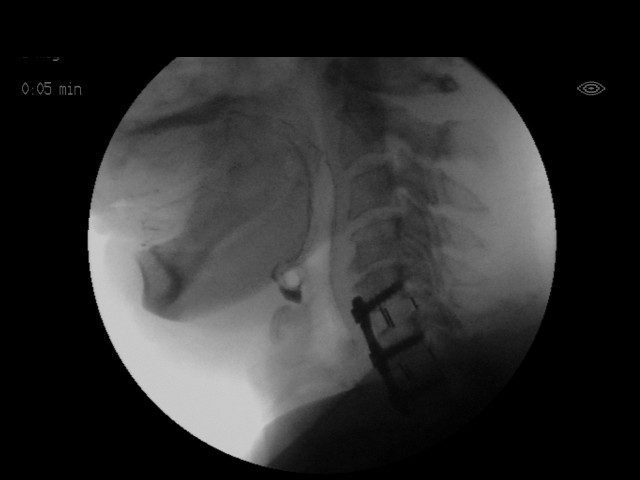
[im 3/22]
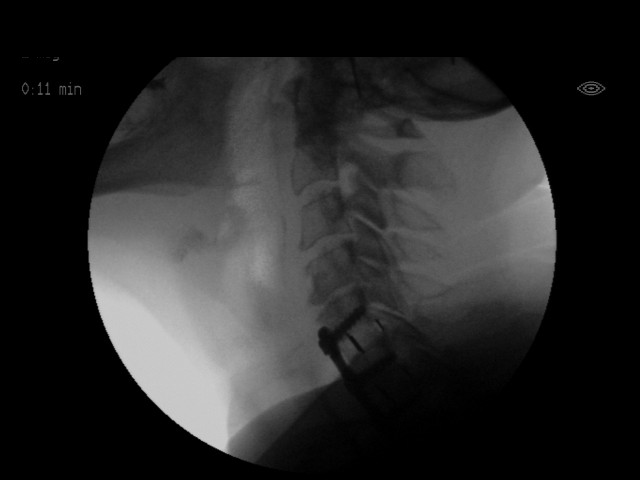
[im 4/22]
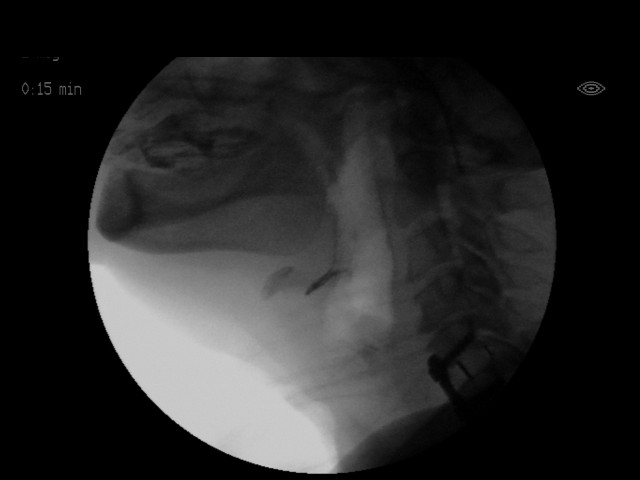
[im 6/22]
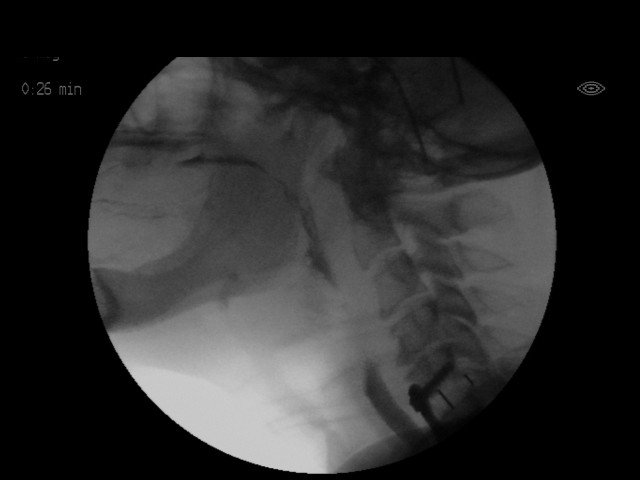
[im 7/22]
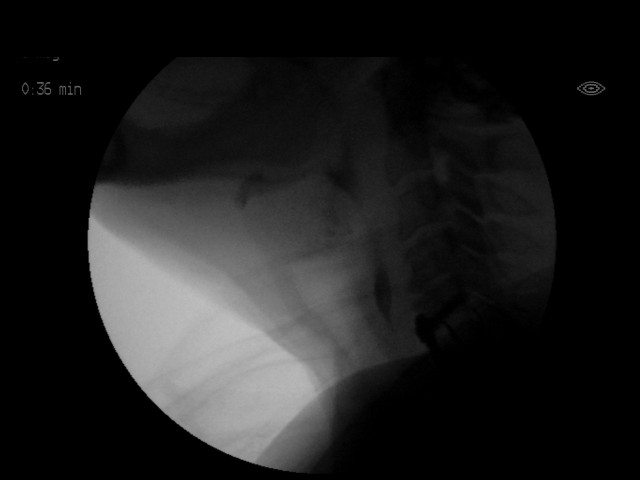
[im 9/22]
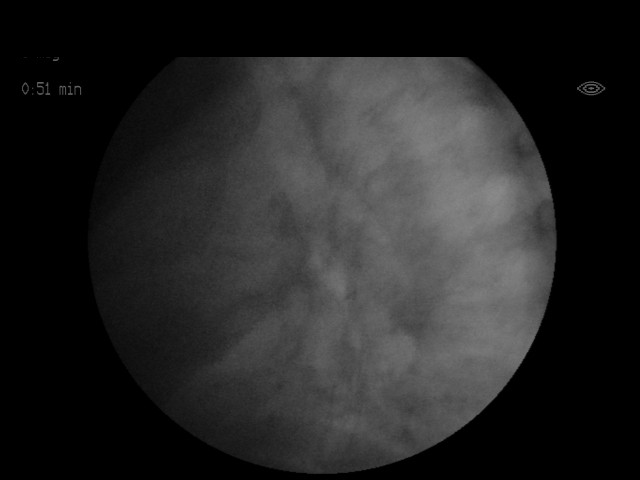
[im 10/22]
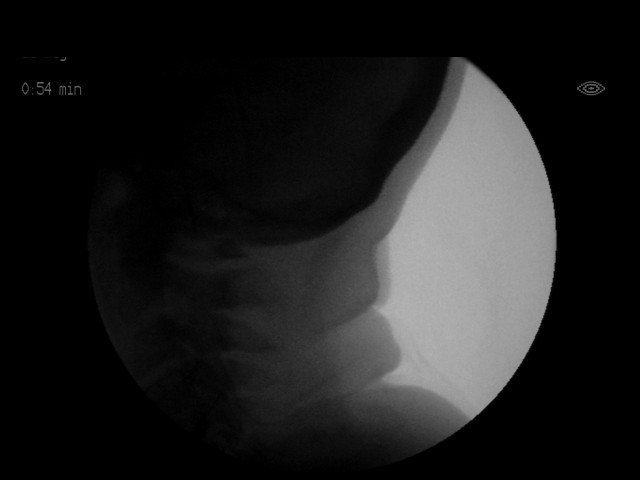
[im 12/22]
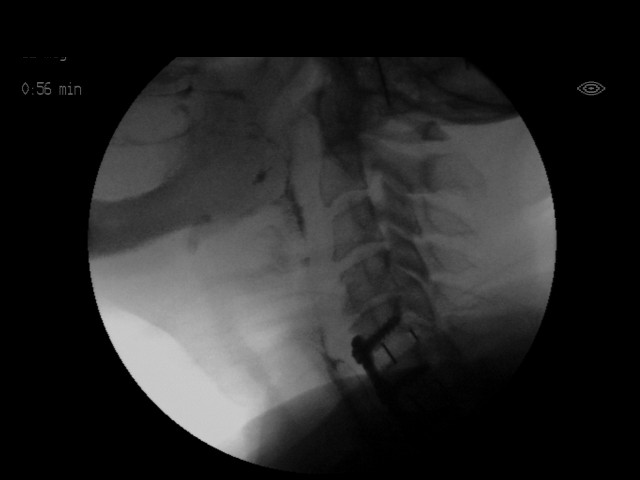
[im 13/22]
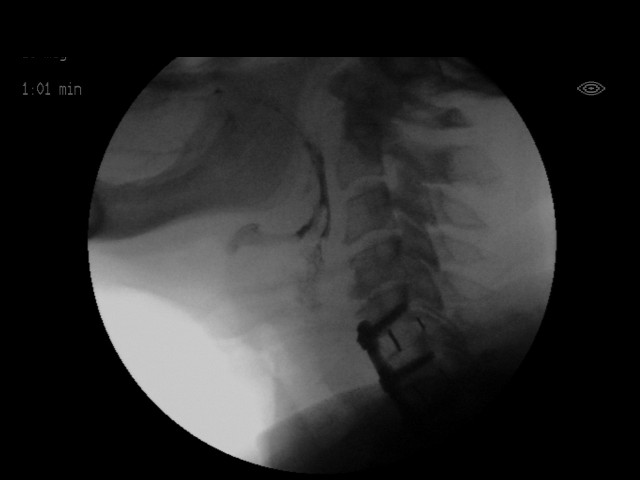
[im 14/22]
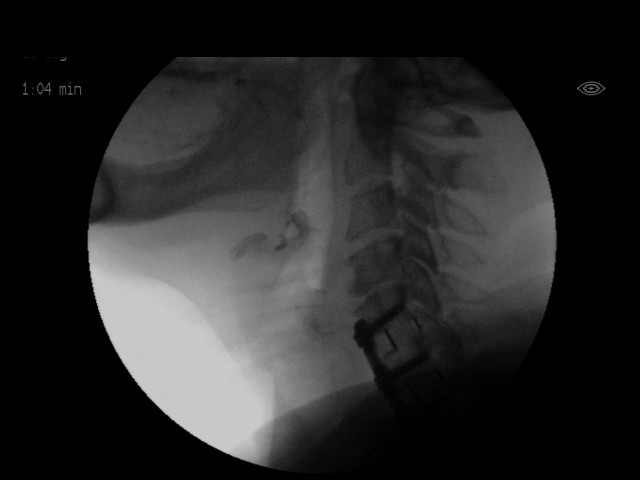
[im 16/22]
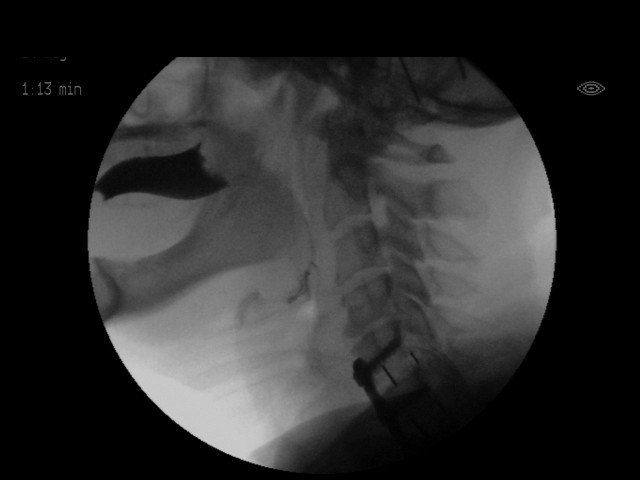
[im 17/22]
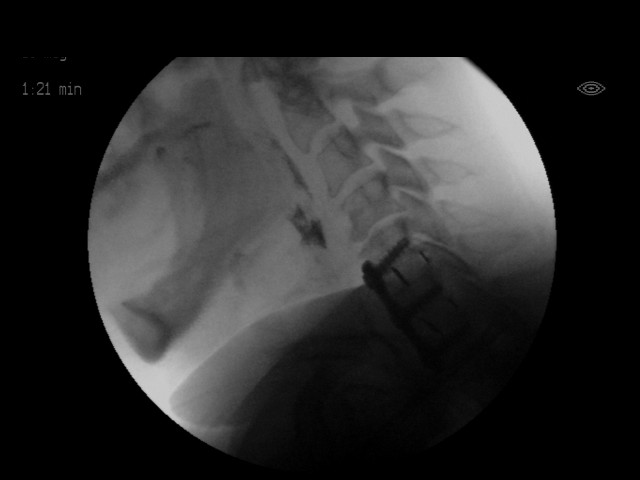
[im 19/22]
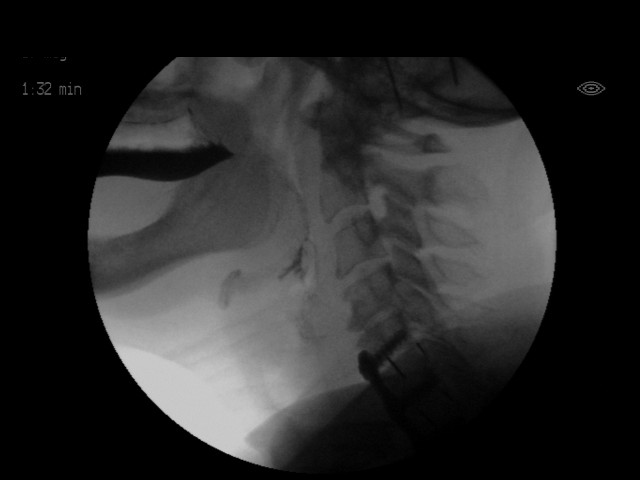
[im 20/22]
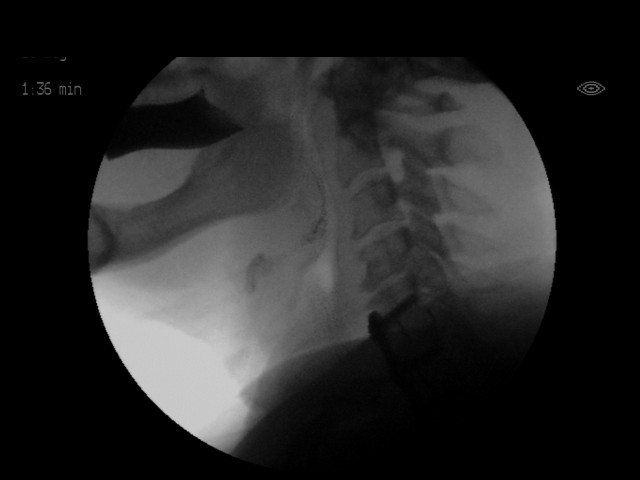
[im 21/22]
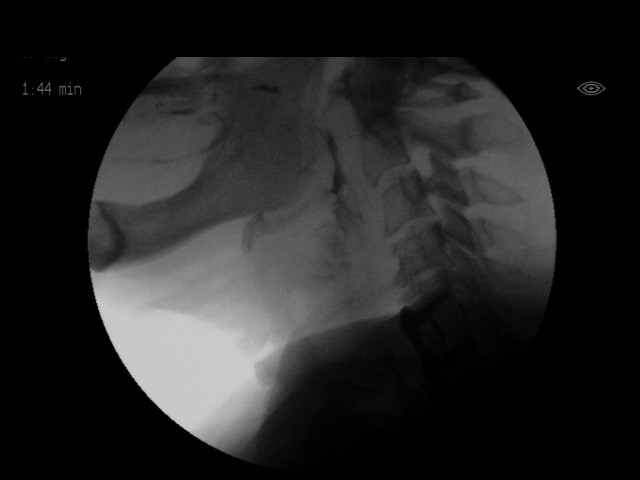
[im 22/22]
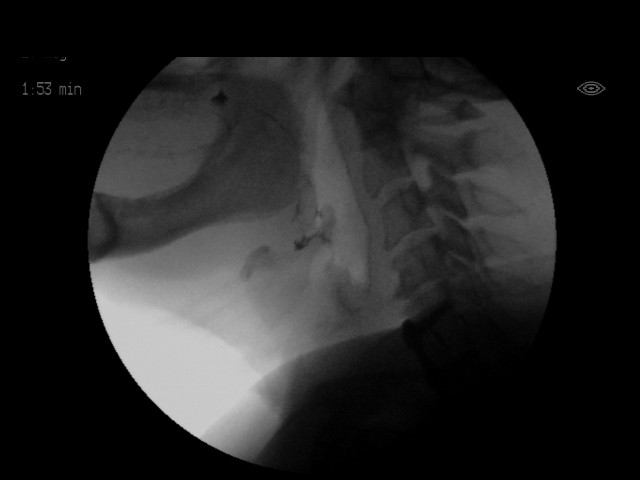

[17 of 24 positions shown; findings below may reference images not displayed]

FINDINGS: Thin liquid- the initial 2 swallows or nearly normal, with only very
minimal O vallecular retention. Subsequent swallows showed multiple
episodes of flash laryngeal penetration without cough, which appear
to be due to a slight delay in epiglottic inversion.

Kuriakose?Laci with cracker- mild retention which cleared with repeat
swallowing.
IMPRESSION: 1. Multiple episodes of flash laryngeal penetration which did not
elicit a cough, especially with thin liquid. This appeared to be due
to a slight delay in epiglottic inversion.

Please refer to the Speech Pathologists report for complete details
and recommendations.

## 2017-11-18 ENCOUNTER — Encounter: Payer: Self-pay | Admitting: Family Medicine

## 2017-11-18 ENCOUNTER — Ambulatory Visit: Payer: Medicare Other | Admitting: Family Medicine

## 2017-11-18 ENCOUNTER — Other Ambulatory Visit: Payer: Self-pay

## 2017-11-18 VITALS — BP 110/70 | HR 88 | Temp 98.4°F | Resp 15 | Ht 59.0 in | Wt 176.8 lb

## 2017-11-18 DIAGNOSIS — Z794 Long term (current) use of insulin: Secondary | ICD-10-CM | POA: Diagnosis not present

## 2017-11-18 DIAGNOSIS — F339 Major depressive disorder, recurrent, unspecified: Secondary | ICD-10-CM

## 2017-11-18 DIAGNOSIS — E039 Hypothyroidism, unspecified: Secondary | ICD-10-CM

## 2017-11-18 DIAGNOSIS — E119 Type 2 diabetes mellitus without complications: Secondary | ICD-10-CM

## 2017-11-18 DIAGNOSIS — I10 Essential (primary) hypertension: Secondary | ICD-10-CM | POA: Diagnosis not present

## 2017-11-18 LAB — TSH: TSH: 0.9 u[IU]/mL (ref 0.35–4.50)

## 2017-11-18 LAB — POCT GLYCOSYLATED HEMOGLOBIN (HGB A1C): HEMOGLOBIN A1C: 7.6

## 2017-11-18 MED ORDER — LISINOPRIL 20 MG PO TABS: 10.0000 mg | ORAL_TABLET | Freq: Every day | ORAL | 3 refills | Status: DC

## 2017-11-18 MED ORDER — PAROXETINE HCL 40 MG PO TABS: 40.0000 mg | ORAL_TABLET | Freq: Every day | ORAL | 1 refills | Status: DC

## 2017-11-18 MED ORDER — EMPAGLIFLOZIN 25 MG PO TABS: 25.0000 mg | ORAL_TABLET | Freq: Every day | ORAL | 5 refills | Status: DC

## 2017-11-18 MED ORDER — PAROXETINE HCL 20 MG PO TABS: 40.0000 mg | ORAL_TABLET | Freq: Every day | ORAL | Status: DC

## 2017-11-18 NOTE — Progress Notes (Signed)
Subjective  CC:  Chief Complaint  Patient presents with  . Medical Clearance    Follow up for surgical clearance, not much has changed  . Hypothyroidism  . Diabetes    HPI: Marissa Thompson is a 67 y.o. female who presents to the office today for follow up of diabetes and problems listed above in the chief complaint.   Diabetes follow up: Her diabetic control is reported as Improved. fastings running about 130. Checks before bed as well, variable readings then w/o lows. Tolerating jardiance 10. Does admit to some lightheadedness upon standing at times.  She denies exertional CP or SOB or symptomatic hypoglycemia. She denies foot sores or paresthesias.   HTN on lisinopril 20 daily.   Hypothyroidism: decreasing dose to 125 4 weeks ago due to low TSH. Feels fine. Medical clearance for surgery on hold until levels are at goal.  Lab Results  Component Value Date   TSH 0.02 (L) 10/17/2017    Depression: patient noted improvement initially on paxil 20, but then seemed down again so increased her dose to 40. She feels it may be helping. No AEs. Needs refill.   Immunization History  Administered Date(s) Administered  . Influenza, High Dose Seasonal PF 08/29/2017  . Influenza-Unspecified 03/16/2013  . PPD Test 02/08/2014, 03/09/2015, 10/24/2015, 06/19/2016  . Pneumococcal Conjugate-13 08/29/2017  . Pneumococcal Polysaccharide-23 07/17/2011  . Td 07/17/2003  . Tdap 12/10/2013    Diabetes Related Lab Review: Lab Results  Component Value Date   HGBA1C 7.6 11/18/2017   HGBA1C 7.8 10/17/2017   HGBA1C 9.8 08/29/2017   today: 7.6  Lab Results  Component Value Date   MICROALBUR 0.5 12/13/2016   Lab Results  Component Value Date   CREATININE 0.65 09/05/2017   BUN 7 09/05/2017   NA 136 09/05/2017   K 4.6 09/05/2017   CL 100 09/05/2017   CO2 28 09/05/2017   Lab Results  Component Value Date   CHOL 139 09/05/2017   CHOL 250 (H) 12/13/2016   CHOL 127 03/01/2016   Lab  Results  Component Value Date   HDL 65.10 09/05/2017   HDL 93 12/13/2016   HDL 81 03/01/2016   Lab Results  Component Value Date   LDLCALC 46 09/05/2017   LDLCALC 119 (H) 12/13/2016   LDLCALC 23 03/01/2016   Lab Results  Component Value Date   TRIG 138.0 09/05/2017   TRIG 189 (H) 12/13/2016   TRIG 116 03/01/2016   Lab Results  Component Value Date   CHOLHDL 2 09/05/2017   CHOLHDL 2.7 12/13/2016   CHOLHDL 1.6 03/01/2016   No results found for: LDLDIRECT The 10-year ASCVD risk score Denman George DC Jr., et al., 2013) is: 8.1%   Values used to calculate the score:     Age: 45 years     Sex: Female     Is Non-Hispanic African American: No     Diabetic: Yes     Tobacco smoker: No     Systolic Blood Pressure: 110 mmHg     Is BP treated: Yes     HDL Cholesterol: 65.1 mg/dL     Total Cholesterol: 139 mg/dL I have reviewed the PMH, Fam and Soc history. Patient Active Problem List   Diagnosis Date Noted  . Benign essential HTN 08/29/2017    Priority: High  . Chronic recurrent major depressive disorder (HCC) 08/29/2017    Priority: High  . Type 2 diabetes mellitus without complications (HCC) 03/09/2015    Priority: High  .  Memory loss 03/08/2015    Priority: High  . Anxiety     Priority: High  . Mixed hyperlipidemia     Priority: High  . Hypothyroidism     Priority: High  . Carpal tunnel syndrome on right 08/29/2016    Priority: Medium  . 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 Dupuytren's contracture of left hand 12/21/2015    Priority: Medium  . Dysphagia, pharyngoesophageal phase 12/29/2014    Priority: Medium    Nl EGD 2019   . GERD (gastroesophageal reflux disease)     Priority: Medium  . Vitamin D deficiency 03/03/2014    Priority: Low  . Allergy     Priority: Low    Social History: Patient  reports that she has never smoked. She has never used smokeless tobacco. She reports that she does not drink alcohol or use drugs.  Review of  Systems: Ophthalmic: negative for eye pain, loss of vision or double vision Cardiovascular: negative for chest pain Respiratory: negative for SOB or persistent cough Gastrointestinal: negative for abdominal pain Genitourinary: negative for dysuria or gross hematuria MSK: negative for foot lesions Neurologic: negative for weakness or gait disturbance  Objective  Vitals: BP 110/70   Pulse 88   Temp 98.4 F (36.9 C) (Oral)   Resp 15   Ht  (1.499 m)   Wt 176 lb 12.8 oz (80.2 kg)   SpO2 95%   BMI 35.71 kg/m  General: well appearing, no acute distress  Psych:  Alert and oriented, normal mood and affect Skin:  Warm, no rashes Neurologic:   Mental status is normal. normal gait, no tremor Foot exam: no erythema, pallor, or cyanosis visible nl proprioception and sensation to monofilament testing bilaterally, +2 distal pulses bilaterally  Assessment  1. Acquired hypothyroidism   2. Type 2 diabetes mellitus without complication, with long-term current use of insulin (HCC)   3. Benign essential HTN   4. Chronic recurrent major depressive disorder (HCC)      Plan   Diabetes is currently marginally controlled. But improving well. Good response to jardiance. Increase to 25. Educated on titrating up insulin as well to get to goal ASAP so can have neck surgery. See AVS.   Recheck thyroid function. May still need lower dose. Want TSH 1-2 before surgery.   Depression; monitor on high dose paxil . Monitor for anticholinergic effects.   Htn: now low on lisinopril and jardiance. Decrease lisinopril to 10 and monitor sxs.   Medical clearance for cervical spine surgery on hold. Paperwork is at my desk.   Diabetic education: ongoing education regarding chronic disease management for diabetes was given today. We continue to reinforce the ABC's of diabetic management: A1c (<7 or 8 dependent upon patient), tight blood pressure control, and cholesterol management with goal LDL < 100 minimally.  We discuss diet strategies, exercise recommendations, medication options and possible side effects. At each visit, we review recommended immunizations and preventive care recommendations for diabetics and stress that good diabetic control can prevent other problems. See below for this patient's data. Follow up: will determine f/u once labs return. See there. .   Commons side effects, risks, benefits, and alternatives for medications and treatment plan prescribed today were discussed, and the patient expressed understanding of the given instructions. Patient is instructed to call or message via MyChart if he/she has any questions or concerns regarding our treatment plan. No barriers to understanding were identified. We discussed Red Flag symptoms and signs in  detail. Patient expressed understanding regarding what to do in case of urgent or emergency type symptoms.   Medication list was reconciled, printed and provided to the patient in AVS. Patient instructions and summary information was reviewed with the patient as documented in the AVS. This note was prepared with assistance of Dragon voice recognition software. Occasional wrong-word or sound-a-like substitutions may have occurred due to the inherent limitations of voice recognition software  Orders Placed This Encounter  Procedures  . TSH  . POCT glycosylated hemoglobin (Hb A1C)   Meds ordered this encounter  Medications  . empagliflozin (JARDIANCE) 25 MG TABS tablet    Sig: Take 25 mg by mouth daily.    Dispense:  30 tablet    Refill:  5  . lisinopril (PRINIVIL,ZESTRIL) 20 MG tablet    Sig: Take 0.5 tablets (10 mg total) by mouth daily.    Dispense:  90 tablet    Refill:  3  . DISCONTD: PARoxetine (PAXIL) 20 MG tablet    Sig: Take 2 tablets (40 mg total) by mouth daily.  Marland Kitchen PARoxetine (PAXIL) 40 MG tablet    Sig: Take 1 tablet (40 mg total) by mouth daily.    Dispense:  90 tablet    Refill:  1

## 2017-11-18 NOTE — Patient Instructions (Addendum)
We will call you with your thyroid results and let you know when to return.   Your diabetes is improving! I have increased your jardiance to  daily.  Keep checking your fasting sugars; we want them to be < 120.   If after 2 weeks, your fasting sugars remain consistently > 120, increase your insulin to 42units for several days; keep increasing your insulin by 2 units every 2-4 days until your fasting sugars are < 120.   Cut your lisinopril dose to  daily to avoid low blood pressure and to help with your lightheadedness.

## 2017-11-20 ENCOUNTER — Telehealth: Payer: Self-pay | Admitting: Family Medicine

## 2017-11-20 NOTE — Telephone Encounter (Signed)
Copied from CRM [id number]. Topic: Quick Communication - Lab Results >> Nov 19, 2017  3:55 PM Fagge, Sinda Du, CMA wrote: Called patient to inform them of lab results. When patient returns call, triage nurse may disclose results.    Please call pt and let her know that her thyroid is not at goal and diabetes should be at goal soon. I suggest returning for diabetes recheck in 4 weeks; if stable, then can schedule her surgery. Thanks!    Pt returning call regarding labs. Please call pt's cell [phone]

## 2017-11-26 ENCOUNTER — Other Ambulatory Visit: Payer: Self-pay | Admitting: Family Medicine

## 2017-11-30 ENCOUNTER — Other Ambulatory Visit: Payer: Self-pay | Admitting: Family Medicine

## 2017-12-02 ENCOUNTER — Other Ambulatory Visit: Payer: Self-pay | Admitting: Family Medicine

## 2017-12-10 ENCOUNTER — Telehealth: Payer: Self-pay | Admitting: Family Medicine

## 2017-12-10 NOTE — Telephone Encounter (Unsigned)
Copied from CRM [id number]. Topic: Quick Communication - Rx Refill/Question >> Dec 10, 2017  4:26 PM Mcneil, Ja-Kwan wrote: Medication: PARoxetine (PAXIL) 40 MG tablet   Preferred Pharmacy (with phone number or street name): [facility] [address] [place], [place] - [address] [place] [phone] (Phone) [phone] (Fax)   Agent: Please be advised that RX refills may take up to 3 business days. We ask that you follow-up with your pharmacy.

## 2017-12-11 NOTE — Telephone Encounter (Signed)
Call to pharmacy- Rx was sent no print- they did not get it- verbal Rx read to pharmacist as written.

## 2017-12-16 ENCOUNTER — Ambulatory Visit: Payer: Medicare Other | Admitting: Family Medicine

## 2017-12-16 DIAGNOSIS — Z0289 Encounter for other administrative examinations: Secondary | ICD-10-CM

## 2017-12-19 ENCOUNTER — Other Ambulatory Visit: Payer: Self-pay

## 2017-12-19 ENCOUNTER — Ambulatory Visit: Payer: Medicare Other | Admitting: Family Medicine

## 2017-12-19 ENCOUNTER — Encounter: Payer: Self-pay | Admitting: Family Medicine

## 2017-12-19 VITALS — BP 102/70 | HR 99 | Temp 98.0°F | Ht 59.0 in | Wt 176.0 lb

## 2017-12-19 DIAGNOSIS — E119 Type 2 diabetes mellitus without complications: Secondary | ICD-10-CM | POA: Diagnosis not present

## 2017-12-19 DIAGNOSIS — H60543 Acute eczematoid otitis externa, bilateral: Secondary | ICD-10-CM | POA: Insufficient documentation

## 2017-12-19 DIAGNOSIS — I10 Essential (primary) hypertension: Secondary | ICD-10-CM

## 2017-12-19 DIAGNOSIS — E039 Hypothyroidism, unspecified: Secondary | ICD-10-CM

## 2017-12-19 LAB — POCT GLYCOSYLATED HEMOGLOBIN (HGB A1C): HEMOGLOBIN A1C: 7.3 % — AB (ref 4.0–5.6)

## 2017-12-19 LAB — TSH: TSH: 0.06 u[IU]/mL — ABNORMAL LOW (ref 0.35–4.50)

## 2017-12-19 MED ORDER — TRIAMCINOLONE ACETONIDE 0.1 % EX CREA: 1.0000 "application " | TOPICAL_CREAM | Freq: Two times a day (BID) | CUTANEOUS | 0 refills | Status: DC

## 2017-12-19 MED ORDER — LISINOPRIL 5 MG PO TABS: 5.0000 mg | ORAL_TABLET | Freq: Every day | ORAL | 3 refills | Status: DC

## 2017-12-19 MED ORDER — TOUJEO SOLOSTAR 300 UNIT/ML ~~LOC~~ SOPN
44.0000 [IU] | PEN_INJECTOR | Freq: Every day | SUBCUTANEOUS | 3 refills | Status: DC
Start: 2017-12-19 — End: 2018-04-28

## 2017-12-19 MED ORDER — LEVOTHYROXINE SODIUM 112 MCG PO TABS: 112.0000 ug | ORAL_TABLET | Freq: Every day | ORAL | 1 refills | Status: DC

## 2017-12-19 NOTE — Progress Notes (Signed)
Subjective  CC:  Chief Complaint  Patient presents with  . Hypothyroidism    last TSH 11/18/2017  . Hypertension  . Diabetes    last A1c 11/18/2017    HPI: Marissa Thompson is a 67 y.o. female who presents to the office today for follow up of diabetes and problems listed above in the chief complaint.   Diabetes follow up: Her diabetic control is reported as Unchanged. She brings in her bid glucose log: readings are highly variable. Most fastings are good but not all and most postprandials are elevated. She has disordered eating for several reasons: adentulous, dysphagia, and lack of appetite. She eats 1-2 instant oatmeal packets in the am and then a box of wheat thins the rest of the day or veggie chips. occ eats some fruits. Rare proteins. Tolerating her medications but toujeo is expensive.  She denies exertional CP or SOB or symptomatic hypoglycemia. She denies foot sores or paresthesias.   Hypothyroidism f/u: Marissa CornerJudith M Fermin is a 67 y.o. female who presents for follow up of hypothyroidism. Last TSH showed control was good, and thyroid supplement medication was adjusted accordingly.  Current symptoms: none . Patient denies change in energy level, diarrhea, heat / cold intolerance, nervousness, palpitations and weight changes. Symptoms have stabilized.She has been compliant with the medication. Due for lab recheck.  Lab Results  Component Value Date   TSH 0.90 11/18/2017     Medical clearance for spine surgery: needs letter.   HTN: decreased ace since starting jardiance. Still low bp with occ lightheadedness. No cp or palpitations or syncope.  Assessment  1. Type 2 diabetes mellitus without complication, without long-term current use of insulin (HCC)   2. Benign essential HTN   3. Acquired hypothyroidism   4. Eczema of both external ears      Plan   Diabetes is currently adequately controlled. Still improving on meds; hasn't yet been 3 months. Suspect will even out at around  7. Diet is non-nutritional. Refer to DM nutritionist. Counseling done. No medication changes for diabetes made today.   HTN: low bp, decrease ace dose further.  Hypothyroidism f/u: due for recheck of TSH; will adjust medications pending labs. Continue taking daily on empty stomach;avoid iron or Vit D/ca for four hours. Discussed sxs of low and high thyroid levels. Should be at goal and medically cleared for surgery. Letter written.  Start steroid cream for flaking in ears.    Follow up: Return in about 3 months (around 03/21/2018) for follow up of diabetes and hypertension.. Orders Placed This Encounter  Procedures  . TSH  . Referral to Nutrition and Diabetes Services  . POCT glycosylated hemoglobin (Hb A1C)   Meds ordered this encounter  Medications  . TOUJEO SOLOSTAR 300 UNIT/ML SOPN    Sig: Inject 44 Units into the skin at bedtime.    Dispense:  4 pen    Refill:  3  . lisinopril (PRINIVIL,ZESTRIL) 5 MG tablet    Sig: Take 1 tablet (5 mg total) by mouth daily.    Dispense:  90 tablet    Refill:  3  . triamcinolone cream (KENALOG) 0.1 %    Sig: Apply 1 application topically 2 (two) times daily. For 2 weeks, then as needed    Dispense:  45 g    Refill:  0      Immunization History  Administered Date(s) Administered  . Influenza, High Dose Seasonal PF 08/29/2017  . Influenza-Unspecified 03/16/2013  . PPD Test 02/08/2014, 03/09/2015,  10/24/2015, 06/19/2016  . Pneumococcal Conjugate-13 08/29/2017  . Pneumococcal Polysaccharide-23 07/17/2011  . Td 07/17/2003  . Tdap 12/10/2013    Diabetes Related Lab Review: Lab Results  Component Value Date   HGBA1C 7.3 (A) 12/19/2017   HGBA1C 7.6 11/18/2017   HGBA1C 7.8 10/17/2017    Lab Results  Component Value Date   MICROALBUR 0.5 12/13/2016   Lab Results  Component Value Date   CREATININE 0.65 09/05/2017   BUN 7 09/05/2017   NA 136 09/05/2017   K 4.6 09/05/2017   CL 100 09/05/2017   CO2 28 09/05/2017   Lab Results    Component Value Date   CHOL 139 09/05/2017   CHOL 250 (H) 12/13/2016   CHOL 127 03/01/2016   Lab Results  Component Value Date   HDL 65.10 09/05/2017   HDL 93 12/13/2016   HDL 81 03/01/2016   Lab Results  Component Value Date   LDLCALC 46 09/05/2017   LDLCALC 119 (H) 12/13/2016   LDLCALC 23 03/01/2016   Lab Results  Component Value Date   TRIG 138.0 09/05/2017   TRIG 189 (H) 12/13/2016   TRIG 116 03/01/2016   Lab Results  Component Value Date   CHOLHDL 2 09/05/2017   CHOLHDL 2.7 12/13/2016   CHOLHDL 1.6 03/01/2016   No results found for: LDLDIRECT The 10-year ASCVD risk score Denman George DC Jr., et al., 2013) is: 7%   Values used to calculate the score:     Age: 67 years     Sex: Female     Is Non-Hispanic African American: No     Diabetic: Yes     Tobacco smoker: No     Systolic Blood Pressure: 102 mmHg     Is BP treated: Yes     HDL Cholesterol: 65.1 mg/dL     Total Cholesterol: 139 mg/dL  Lab Results  Component Value Date   TSH 0.90 11/18/2017    I have reviewed the PMH, Fam and Soc history. Patient Active Problem List   Diagnosis Date Noted  . Benign essential HTN 08/29/2017    Priority: High  . Chronic recurrent major depressive disorder (HCC) 08/29/2017    Priority: High  . Type 2 diabetes mellitus without complications (HCC) 03/09/2015    Priority: High  . Memory loss 03/08/2015    Priority: High  . Anxiety     Priority: High  . Mixed hyperlipidemia     Priority: High  . Hypothyroidism     Priority: High  . Carpal tunnel syndrome on right 08/29/2016    Priority: Medium  . 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 Dupuytren's contracture of left hand 12/21/2015    Priority: Medium  . Dysphagia, pharyngoesophageal phase 12/29/2014    Priority: Medium    Nl EGD 2019   . GERD (gastroesophageal reflux disease)     Priority: Medium  . Vitamin D deficiency 03/03/2014    Priority: Low  . Allergy     Priority: Low  . Eczema of both  external ears 12/19/2017    Social History: Patient  reports that she has never smoked. She has never used smokeless tobacco. She reports that she does not drink alcohol or use drugs.  Review of Systems: Ophthalmic: negative for eye pain, loss of vision or double vision Cardiovascular: negative for chest pain Respiratory: negative for SOB or persistent cough Gastrointestinal: negative for abdominal pain Genitourinary: negative for dysuria or gross hematuria MSK: negative for foot lesions Neurologic: negative for  weakness or gait disturbance Skin: c/o flaking and irritated external ears.  Objective  Vitals: BP 102/70   Pulse 99   Temp 98 F (36.7 C)   Ht 4\' 11"  (1.499 m)   Wt 176 lb (79.8 kg)   BMI 35.55 kg/m  General: well appearing, no acute distress  Psych:  Alert and oriented, normal mood and affect HEENT:  Normocephalic, atraumatic, moist mucous membranes, supple neck  Cardiovascular:  Nl S1 and S2, RRR without murmur, gallop or rub. no edema Respiratory:  Good breath sounds bilaterally, CTAB with normal effort, no rales Gastrointestinal: normal BS, soft, nontender Skin:  Warm, eczematous changes on bilateral ears Neurologic:   Mental status is normal. normal gait Foot exam: no erythema, pallor, or cyanosis visible nl proprioception and sensation to monofilament testing bilaterally, +2 distal pulses bilaterally    Diabetic education: ongoing education regarding chronic disease management for diabetes was given today. We continue to reinforce the ABC's of diabetic management: A1c (<7 or 8 dependent upon patient), tight blood pressure control, and cholesterol management with goal LDL < 100 minimally. We discuss diet strategies, exercise recommendations, medication options and possible side effects. At each visit, we review recommended immunizations and preventive care recommendations for diabetics and stress that good diabetic control can prevent other problems. See below for  this patient's data.    Commons side effects, risks, benefits, and alternatives for medications and treatment plan prescribed today were discussed, and the patient expressed understanding of the given instructions. Patient is instructed to call or message via MyChart if he/she has any questions or concerns regarding our treatment plan. No barriers to understanding were identified. We discussed Red Flag symptoms and signs in detail. Patient expressed understanding regarding what to do in case of urgent or emergency type symptoms.   Medication list was reconciled, printed and provided to the patient in AVS. Patient instructions and summary information was reviewed with the patient as documented in the AVS. This note was prepared with assistance of Dragon voice recognition software. Occasional wrong-word or sound-a-like substitutions may have occurred due to the inherent limitations of voice recognition software

## 2017-12-19 NOTE — Addendum Note (Signed)
Addended by: Asencion PartridgeANDY, CAMILLE on: 12/19/2017 04:57 PM   Modules accepted: Orders

## 2017-12-19 NOTE — Patient Instructions (Addendum)
Please return in 3 months for diabetes recheck.   Please check with your insurance company to ask which is the least expensive  Long acting insulin on the formulary: toujeo, lantus or tresiba, basaglar. As well, we may be able to have you apply for low income assistance.   We will fax in a medical clearance letter for you.   We will call you with information regarding your referral appointment. Diabetes nutrition education.  If you do not hear from us within the next 2 weeks, please let me know. It can take 1-2 weeks to get appointments set up with the specialists.    If you have any questions or concerns, please don't hesitate to send me a message via MyChart or call the office at [phone]. Thank you for visiting with us today! It's our pleasure caring for you.  Please set up an appointment for a diabetic eye exam and have the results sent to me.

## 2017-12-20 ENCOUNTER — Ambulatory Visit: Payer: Medicare Other | Admitting: Family Medicine

## 2018-01-19 ENCOUNTER — Other Ambulatory Visit: Payer: Self-pay | Admitting: Family Medicine

## 2018-01-20 ENCOUNTER — Encounter: Payer: Medicare Other | Admitting: Family Medicine

## 2018-01-20 DIAGNOSIS — Z0289 Encounter for other administrative examinations: Secondary | ICD-10-CM

## 2018-01-20 NOTE — Telephone Encounter (Signed)
Last visit was 12/19/2017 Last Fill: 11/04/2017  Ok to refill?

## 2018-02-13 HISTORY — PX: ANTERIOR CERVICAL DISCECTOMY: SHX1160

## 2018-02-14 DIAGNOSIS — R9431 Abnormal electrocardiogram [ECG] [EKG]: Secondary | ICD-10-CM | POA: Diagnosis not present

## 2018-02-14 DIAGNOSIS — Z01812 Encounter for preprocedural laboratory examination: Secondary | ICD-10-CM | POA: Diagnosis not present

## 2018-02-14 DIAGNOSIS — Z01810 Encounter for preprocedural cardiovascular examination: Secondary | ICD-10-CM | POA: Diagnosis not present

## 2018-02-18 DIAGNOSIS — M50021 Cervical disc disorder at C4-C5 level with myelopathy: Secondary | ICD-10-CM | POA: Diagnosis not present

## 2018-02-18 DIAGNOSIS — Z794 Long term (current) use of insulin: Secondary | ICD-10-CM | POA: Diagnosis not present

## 2018-02-18 DIAGNOSIS — E119 Type 2 diabetes mellitus without complications: Secondary | ICD-10-CM | POA: Diagnosis not present

## 2018-02-18 DIAGNOSIS — M4722 Other spondylosis with radiculopathy, cervical region: Secondary | ICD-10-CM | POA: Diagnosis not present

## 2018-02-18 DIAGNOSIS — M4802 Spinal stenosis, cervical region: Secondary | ICD-10-CM | POA: Diagnosis not present

## 2018-02-18 DIAGNOSIS — Z981 Arthrodesis status: Secondary | ICD-10-CM | POA: Diagnosis not present

## 2018-02-18 DIAGNOSIS — E039 Hypothyroidism, unspecified: Secondary | ICD-10-CM | POA: Diagnosis not present

## 2018-02-18 DIAGNOSIS — M5001 Cervical disc disorder with myelopathy,  high cervical region: Secondary | ICD-10-CM | POA: Diagnosis not present

## 2018-02-18 DIAGNOSIS — M4712 Other spondylosis with myelopathy, cervical region: Secondary | ICD-10-CM | POA: Diagnosis not present

## 2018-02-18 DIAGNOSIS — Z7982 Long term (current) use of aspirin: Secondary | ICD-10-CM | POA: Diagnosis not present

## 2018-02-18 DIAGNOSIS — Z86718 Personal history of other venous thrombosis and embolism: Secondary | ICD-10-CM | POA: Diagnosis not present

## 2018-02-18 DIAGNOSIS — M4322 Fusion of spine, cervical region: Secondary | ICD-10-CM | POA: Diagnosis not present

## 2018-02-19 DIAGNOSIS — M4322 Fusion of spine, cervical region: Secondary | ICD-10-CM | POA: Diagnosis not present

## 2018-02-19 DIAGNOSIS — Z86718 Personal history of other venous thrombosis and embolism: Secondary | ICD-10-CM | POA: Diagnosis not present

## 2018-02-19 DIAGNOSIS — Z7982 Long term (current) use of aspirin: Secondary | ICD-10-CM | POA: Diagnosis not present

## 2018-02-19 DIAGNOSIS — Z981 Arthrodesis status: Secondary | ICD-10-CM | POA: Diagnosis not present

## 2018-02-19 DIAGNOSIS — E039 Hypothyroidism, unspecified: Secondary | ICD-10-CM | POA: Diagnosis not present

## 2018-02-19 DIAGNOSIS — E119 Type 2 diabetes mellitus without complications: Secondary | ICD-10-CM | POA: Diagnosis not present

## 2018-02-19 DIAGNOSIS — M4712 Other spondylosis with myelopathy, cervical region: Secondary | ICD-10-CM | POA: Diagnosis not present

## 2018-02-19 DIAGNOSIS — Z794 Long term (current) use of insulin: Secondary | ICD-10-CM | POA: Diagnosis not present

## 2018-02-26 DIAGNOSIS — M542 Cervicalgia: Secondary | ICD-10-CM | POA: Diagnosis not present

## 2018-02-27 ENCOUNTER — Other Ambulatory Visit: Payer: Self-pay | Admitting: Family Medicine

## 2018-02-28 ENCOUNTER — Encounter (HOSPITAL_COMMUNITY): Payer: Self-pay | Admitting: *Deleted

## 2018-02-28 ENCOUNTER — Emergency Department (HOSPITAL_COMMUNITY): Payer: Medicare HMO

## 2018-02-28 ENCOUNTER — Emergency Department (HOSPITAL_COMMUNITY)
Admission: EM | Admit: 2018-02-28 | Discharge: 2018-03-01 | Disposition: A | Discharge status: Home / Self Care | Payer: Medicare HMO | Attending: Emergency Medicine | Admitting: Emergency Medicine

## 2018-02-28 ENCOUNTER — Other Ambulatory Visit: Payer: Self-pay

## 2018-02-28 DIAGNOSIS — E039 Hypothyroidism, unspecified: Secondary | ICD-10-CM | POA: Diagnosis not present

## 2018-02-28 DIAGNOSIS — R490 Dysphonia: Secondary | ICD-10-CM

## 2018-02-28 DIAGNOSIS — Z794 Long term (current) use of insulin: Secondary | ICD-10-CM | POA: Diagnosis not present

## 2018-02-28 DIAGNOSIS — Z7982 Long term (current) use of aspirin: Secondary | ICD-10-CM | POA: Diagnosis not present

## 2018-02-28 DIAGNOSIS — I10 Essential (primary) hypertension: Secondary | ICD-10-CM | POA: Insufficient documentation

## 2018-02-28 DIAGNOSIS — Z79899 Other long term (current) drug therapy: Secondary | ICD-10-CM | POA: Insufficient documentation

## 2018-02-28 DIAGNOSIS — E119 Type 2 diabetes mellitus without complications: Secondary | ICD-10-CM | POA: Insufficient documentation

## 2018-02-28 DIAGNOSIS — R0602 Shortness of breath: Secondary | ICD-10-CM | POA: Diagnosis not present

## 2018-02-28 DIAGNOSIS — R221 Localized swelling, mass and lump, neck: Secondary | ICD-10-CM | POA: Diagnosis not present

## 2018-02-28 DIAGNOSIS — R499 Unspecified voice and resonance disorder: Secondary | ICD-10-CM | POA: Diagnosis present

## 2018-02-28 LAB — BASIC METABOLIC PANEL
Anion gap: 13 (ref 5–15)
BUN: 9 mg/dL (ref 8–23)
CALCIUM: 9 mg/dL (ref 8.9–10.3)
CHLORIDE: 104 mmol/L (ref 98–111)
CO2: 24 mmol/L (ref 22–32)
CREATININE: 0.81 mg/dL (ref 0.44–1.00)
GFR calc Af Amer: >60 mL/min (ref >60)
GFR calc non Af Amer: >60 mL/min (ref >60)
Glucose, Bld: 204 mg/dL — ABNORMAL HIGH (ref 70–99)
Potassium: 4.2 mmol/L (ref 3.5–5.1)
SODIUM: 141 mmol/L (ref 135–145)

## 2018-02-28 LAB — CBC WITH DIFFERENTIAL/PLATELET
Abs Immature Granulocytes: 0 10*3/uL (ref 0.0–0.1)
BASOS ABS: 0 10*3/uL (ref 0.0–0.1)
Basophils Relative: 0 %
EOS ABS: 0 10*3/uL (ref 0.0–0.7)
EOS PCT: 0 %
HCT: 37.7 % (ref 36.0–46.0)
HEMOGLOBIN: 11.2 g/dL — AB (ref 12.0–15.0)
Immature Granulocytes: 0 %
LYMPHS PCT: 11 %
Lymphs Abs: 0.8 10*3/uL (ref 0.7–4.0)
MCH: 24.8 pg — AB (ref 26.0–34.0)
MCHC: 29.7 g/dL — AB (ref 30.0–36.0)
MCV: 83.6 fL (ref 78.0–100.0)
MONO ABS: 0.2 10*3/uL (ref 0.1–1.0)
Monocytes Relative: 2 %
Neutro Abs: 6.7 10*3/uL (ref 1.7–7.7)
Neutrophils Relative %: 87 %
Platelets: 302 10*3/uL (ref 150–400)
RBC: 4.51 MIL/uL (ref 3.87–5.11)
RDW: 18.4 % — AB (ref 11.5–15.5)
WBC: 7.7 10*3/uL (ref 4.0–10.5)

## 2018-02-28 MED ORDER — IOHEXOL 300 MG/ML  SOLN
75.0000 mL | Freq: Once | INTRAMUSCULAR | Status: AC | PRN
  Administered 2018-02-28: 75 mL via INTRAVENOUS

## 2018-02-28 NOTE — ED Provider Notes (Addendum)
Patient placed in Quick Look pathway, seen and evaluated   Chief Complaint: neck swelling postoperative, voice change, trouble swallowing  HPI: Patient presents to the emergency department after [facility] [place] [facility] due to radicular symptoms in her upper extremity.  Patient states that she had swelling after her surgery in her left anterior neck.  She was seen and started on prednisone.  The swelling has progressed and today she noted that her voice has changed.  She has had difficulty swallowing over the past couple of days like food is getting stuck.  No chest pains.  ROS:  Positive ROS: (+) Dysphagia, voice change, neck swelling Negative ROS: (-) Chest pain  Physical Exam:   Gen: No distress  Neuro: Awake and Alert  Skin: Warm    Focused Exam: General raspy voice, ENT postsurgical changes to the left anterior neck with associated soft tissue swelling, no overlying erythema or signs of infection; heart RRR, nml S1,S2, no m/r/g; Lungs CTAB, no airway posturing, no stridor; Abd soft, NT, no rebound or guarding; Ext 2+ pedal pulses bilaterally, no edema.  BP 140/62   Pulse 70   Temp 99.5 F (37.5 C) (Oral)   Resp 10   Ht 4\' 11"  (1.499 m)   Wt 79 kg   SpO2 100%   BMI 35.18 kg/m   Plan: Patient with progressive left neck swelling after surgery, CT imaging ordered to further evaluate.  Given voice change, will also evaluate the lung apices with 2 view chest x-ray.  Low suspicion for cardiac or pulmonary etiology.  No impending airway compromise suspected, however patient will be need to monitor closely.  Initiation of care has begun. The patient has been counseled on the process, plan, and necessity for staying for the completion/evaluation, and the remainder of the medical screening examination    Renne CriglerGeiple, Carole Deere, PA-C 03/01/18 1501    Renne CriglerGeiple, Mirenda Baltazar, PA-C 03/01/18 1502    Gerhard MunchLockwood, Robert, MD 03/01/18 2053

## 2018-02-28 NOTE — ED Triage Notes (Signed)
Pt states cervical fusion on 8/6.  Seemed to be healing well and then was placed on prednisone for swelling of anterior incision.  States today began feeling sob.  Airway patent.  No s/s of infection at incision.  Pt able to speak in full sentences.

## 2018-03-01 MED ORDER — DEXAMETHASONE SODIUM PHOSPHATE 10 MG/ML IJ SOLN
10.0000 mg | Freq: Once | INTRAMUSCULAR | Status: AC
  Administered 2018-03-01: 10 mg via INTRAVENOUS
  Filled 2018-03-01: qty 1

## 2018-03-01 NOTE — Discharge Instructions (Addendum)
Return or call 911 if you have any sudden swelling of your neck, trouble swallowing, difficulty breathing.  Otherwise follow-up with your spinal surgeon in the office this week.

## 2018-03-01 NOTE — ED Provider Notes (Signed)
[facility] Provider Note   CSN: [id number] Arrival date & time: 02/28/18  1744     History   Chief Complaint Chief Complaint  Patient presents with  . Shortness of Breath    HPI Marissa Thompson is a 67 y.o. female.  Patient presents to the emergency department for evaluation of voice change and dry mouth.  Patient had anterior cervical [facility].  Patient reports that she started having swelling of her neck earlier this week.  2 days ago she was placed on a prednisone taper.  She reports that the swelling of her neck, shortness of breath and difficulty swallowing has all improved on the prednisone but now she notices a very hoarse voice.  She is not having any difficulty swallowing.     Past Medical History:  Diagnosis Date  . Allergy   . Anxiety 2003  . Cough    for over a year per pt-  . Depression 1998  . Diabetes mellitus without complication (HCC) 1998  . GERD (gastroesophageal reflux disease) 1980  . HLD (hyperlipidemia)   . Hyperlipidemia 1990  . Hypothyroidism 1998  . PONV (postoperative nausea and vomiting)    1 time per pt in 2001  . SOB (shortness of breath) on exertion     Patient Active Problem List   Diagnosis Date Noted  . Eczema of both external ears 12/19/2017  . Benign essential HTN 08/29/2017  . Chronic recurrent major depressive disorder (HCC) 08/29/2017  . Carpal tunnel syndrome on right 08/29/2016  . 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 Dupuytren's contracture of left hand 12/21/2015  . Type 2 diabetes mellitus without complications (HCC) 03/09/2015  . Memory loss 03/08/2015  . Dysphagia, pharyngoesophageal phase 12/29/2014  . Vitamin D deficiency 03/03/2014  . Allergy   . Anxiety   . GERD (gastroesophageal reflux disease)   . Mixed hyperlipidemia   . Hypothyroidism     Past Surgical History:  Procedure Laterality Date  . BACK SURGERY     x3 Lumbar  . CARPAL TUNNEL RELEASE  Left 03/08/2016   Procedure: LEFT CARPAL TUNNEL RELEASE;  Surgeon: Betha LoaKevin Kuzma, MD;  Location: [facility];  Service: Orthopedics;  Laterality: Left;  . CERVICAL POLYPECTOMY N/A 08/17/2013   Procedure: CERVICAL POLYPECTOMY;  Surgeon: Mickel Baasichard D Kaplan, MD;  Location: [facility];  Service: Gynecology;  Laterality: N/A;  . COLON SURGERY  2007   benign mass  . ECTOPIC PREGNANCY SURGERY    . ESOPHAGEAL DILATION     x3  . HYSTEROSCOPY W/D&C N/A 08/17/2013   Procedure: DILATATION AND CURETTAGE /HYSTEROSCOPY;  Surgeon: Mickel Baasichard D Kaplan, MD;  Location: [facility];  Service: Gynecology;  Laterality: N/A;  YAG LASER  . NECK SURGERY     c5-7 ACDF 3/15/has screws and plate in neck  . TONSILLECTOMY    . TRIGGER FINGER RELEASE Left 03/08/2016   Procedure: LEFT LONG TRIGGER RELEASE AND LEFT RING TRIGGER RELEASE;  Surgeon: Betha LoaKevin Kuzma, MD;  Location: [facility];  Service: Orthopedics;  Laterality: Left;  . UPPER GASTROINTESTINAL ENDOSCOPY       OB History   None      Home Medications    Prior to Admission medications   Medication Sig Start Date End Date Taking? Authorizing Provider  aspirin EC 81 MG tablet Take 81 mg by mouth daily.   Yes [provider]  clobetasol (TEMOVATE) 0.05 % external solution Apply 1 application topically at bedtime.  09/01/17  Yes [provider]  cyclobenzaprine (FLEXERIL) 10 MG tablet TAKE 1 TABLET BY MOUTH THREE TIMES A DAY AS NEEDED FOR MUSCLE SPASMS Patient taking differently: Take 10 mg by mouth 3 (three) times daily as needed for muscle spasms.  01/20/18  Yes Willow OraAndy, Camille L, MD  empagliflozin (JARDIANCE) 25 MG TABS tablet Take 25 mg by mouth daily. 11/18/17  Yes Willow OraAndy, Camille L, MD  gabapentin (NEURONTIN) 600 MG tablet Take 1,200 mg by mouth 3 (three) times daily. 02/16/18  Yes [provider]  HYDROcodone-acetaminophen (NORCO) 10-325 MG tablet Take 1 tablet by mouth every 4 (four) hours as needed for pain. 02/26/18  Yes [provider]  ibuprofen (ADVIL,MOTRIN) 200 MG tablet Take 800 mg by mouth every 4 (four) hours as needed for fever, headache, moderate pain or cramping.   Yes [provider]  levothyroxine (SYNTHROID, LEVOTHROID) 112 MCG tablet Take 1 tablet (112 mcg total) by mouth daily. 12/19/17  Yes Willow OraAndy, Camille L, MD  metFORMIN (GLUCOPHAGE) 1000 MG tablet Take 1 tablet (1,000 mg total) by mouth 2 (two) times daily with a meal. 02/18/17  Yes Donita BrooksPickard, Warren T, MD  pantoprazole (PROTONIX) 40 MG tablet Take 1 tablet (40 mg total) by mouth daily. 03/04/17  Yes Donita BrooksPickard, Warren T, MD  PARoxetine (PAXIL) 40 MG tablet Take 1 tablet (40 mg total) by mouth daily. 11/18/17  Yes Willow OraAndy, Camille L, MD  pravastatin (PRAVACHOL) 40 MG tablet TAKE 1 TABLET BY MOUTH DAILY WITH SUPPER Patient taking differently: Take 40 mg by mouth daily.  08/26/17  Yes PickardPriscille Heidelberg, Warren T, MD  TOUJEO SOLOSTAR 300 UNIT/ML SOPN Inject 44 Units into the skin at bedtime. 12/19/17  Yes Willow OraAndy, Camille L, MD  triamcinolone cream (KENALOG) 0.1 % Apply 1 application topically 2 (two) times daily. For 2 weeks, then as needed Patient taking differently: Apply 1 application topically 2 (two) times daily as needed (skin irritation).  12/19/17  Yes Willow OraAndy, Camille L, MD  Insulin Pen Needle (CAREFINE PEN NEEDLES) 32G X 4 MM MISC Use as directed 06/16/15   Donita BrooksPickard, Warren T, MD  Insulin Pen Needle (FIFTY50 PEN NEEDLES) 32G X 4 MM MISC Use as directed 04/15/17   Donita BrooksPickard, Warren T, MD  lisinopril (PRINIVIL,ZESTRIL) 5 MG tablet Take 1 tablet (5 mg total) by mouth daily. Patient not taking: Reported on 02/28/2018 12/19/17   Willow OraAndy, Camille L, MD    Family History Family History  Problem Relation Age of Onset  . Depression Mother   . Diabetes Mother   . Hypertension Mother   . Alcohol abuse Father   . Arthritis Father   . Cancer Father [phone]       Oral Cancer- had tongue, jaw resection--smoker and alcohol  . Alcohol abuse Brother     Social History Social History    Tobacco Use  . Smoking status: Never Smoker  . Smokeless tobacco: Never Used  Substance Use Topics  . Alcohol use: No    Alcohol/week: 0.0 standard drinks  . Drug use: No     Allergies   Toradol [ketorolac tromethamine]; Baclofen; Morphine and related; Penicillins; and Talwin [pentazocine]   Review of Systems Review of Systems  HENT: Positive for voice change. Negative for trouble swallowing.   All other systems reviewed and are negative.    Physical Exam Updated Vital Signs BP 132/73 (BP Location: Left Arm)   Pulse 80   Temp 99.5 F (37.5 C) (Oral)   Resp 17   Ht 4\' 11"  (1.499  m)   Wt 79 kg   SpO2 100%   BMI 35.18 kg/m   Physical Exam  Constitutional: She is oriented to person, place, and time. She appears well-developed and well-nourished. No distress.  HENT:  Head: Normocephalic and atraumatic.  Right Ear: Hearing normal.  Left Ear: Hearing normal.  Nose: Nose normal.  Mouth/Throat: Oropharynx is clear and moist and mucous membranes are normal.  Eyes: Pupils are equal, round, and reactive to light. Conjunctivae and EOM are normal.  Neck: Normal range of motion. Neck supple.  Left anterior cervical fusion scar intact, no erythema, induration, fluctuance or drainage  Cardiovascular: Regular rhythm, S1 normal and S2 normal. Exam reveals no gallop and no friction rub.  No murmur heard. Pulmonary/Chest: Effort normal and breath sounds normal. No respiratory distress. She exhibits no tenderness.  Abdominal: Soft. Normal appearance and bowel sounds are normal. There is no hepatosplenomegaly. There is no tenderness. There is no rebound, no guarding, no tenderness at McBurney's point and negative Murphy's sign. No hernia.  Musculoskeletal: Normal range of motion.  Neurological: She is alert and oriented to person, place, and time. She has normal strength. No cranial nerve deficit or sensory deficit. Coordination normal. GCS eye subscore is 4. GCS verbal subscore is 5.  GCS motor subscore is 6.  Skin: Skin is warm, dry and intact. No rash noted. No cyanosis or erythema.  Psychiatric: She has a normal mood and affect. Her speech is normal and behavior is normal. Thought content normal.  Nursing note and vitals reviewed.    ED Treatments / Results  Labs (all labs ordered are listed, but only abnormal results are displayed) Labs Reviewed  CBC WITH DIFFERENTIAL/PLATELET - Abnormal; Notable for the following components:      Result Value   Hemoglobin 11.2 (*)    MCH 24.8 (*)    MCHC 29.7 (*)    RDW 18.4 (*)    All other components within normal limits  BASIC METABOLIC PANEL - Abnormal; Notable for the following components:   Glucose, Bld 204 (*)    All other components within normal limits    EKG EKG Interpretation  Date/Time:  Friday February 28 2018 18:03:27 EDT Ventricular Rate:  93 PR Interval:  138 QRS Duration: 80 QT Interval:  380 QTC Calculation: 472 R Axis:   63 Text Interpretation:  Normal sinus rhythm Nonspecific T wave abnormality Abnormal ECG Confirmed by Gilda Crease [phone]) on 03/01/2018 12:52:42 AM   Radiology Dg Chest 2 View  Result Date: 02/28/2018 CLINICAL DATA:  Patient with recent cervical spine fusion. Shortness of breath. EXAM: CHEST - 2 VIEW COMPARISON:  Chest radiograph 03/28/2016 FINDINGS: Cervical spinal fusion hardware is demonstrated. Stable cardiac and mediastinal contours. Mild elevation right hemidiaphragm. No consolidative pulmonary opacities. No pleural effusion or pneumothorax. Thoracic spine degenerative changes. IMPRESSION: No acute cardiopulmonary process. Electronically Signed   By: Annia Belt M.D.   On: 02/28/2018 19:10   Ct Soft Tissue Neck W Contrast  Result Date: 02/28/2018 CLINICAL DATA:  Recent surgery with postoperative swelling EXAM: CT NECK WITH CONTRAST TECHNIQUE: Multidetector CT imaging of the neck was performed using the standard protocol following the bolus administration of  intravenous contrast. CONTRAST:  75mL OMNIPAQUE IOHEXOL 300 MG/ML  SOLN COMPARISON:  CT cervical spine 03/28/2016 FINDINGS: PHARYNX AND LARYNX: Posterior pharyngeal and laryngeal tissues are partially obscured by streak artifact from C3-C7 ACDF hardware. There is a small prevertebral collection at the C3-C5 levels that measures 4 mm in thickness and 33  mm in craniocaudal dimension. There is no airway narrowing. SALIVARY GLANDS: --Parotid: No mass lesion or inflammation. No sialolithiasis or ductal dilatation. --Submandibular: Symmetric without inflammation. No sialolithiasis or ductal dilatation. --Sublingual: Normal. No ranula or other visible lesion of the base of tongue and floor of mouth. THYROID: Normal. LYMPH NODES: No enlarged or abnormal density lymph nodes. VASCULAR: Major cervical vessels are patent. LIMITED INTRACRANIAL: Normal. VISUALIZED ORBITS: Normal. MASTOIDS AND VISUALIZED PARANASAL SINUSES: No fluid levels or advanced mucosal thickening. No mastoid effusion. SKELETON: C3-C7 ACDF.  No abnormal lucency. UPPER CHEST: Clear. OTHER: There is mild soft tissue density material at the site of left anterior surgical approach at the level of the thyroid cartilage. IMPRESSION: 1. Status post C3-C7 ACDF with small prevertebral postoperative fluid collection at the C3-5 levels without airway stenosis. 2. Expected postoperative change in the area of the left anterolateral approach at the level of the thyroid cartilage. Electronically Signed   By: Deatra Robinson M.D.   On: 02/28/2018 21:16    Procedures Procedures (including critical care time)  Medications Ordered in ED Medications  iohexol (OMNIPAQUE) 300 MG/ML solution 75 mL (75 mLs Intravenous Contrast Given 02/28/18 2008)     Initial Impression / Assessment and Plan / ED Course  I have reviewed the triage vital signs and the nursing notes.  Pertinent labs & imaging results that were available during my care of the patient were reviewed by me and  considered in my medical decision making (see chart for details).     Patient presents with concerns over possible post cervical fusion complications.  Patient placed on prednisone by her neurosurgeon 2 days ago because of surgical site swelling and difficulty swallowing.  She reports that the swallowing difficulty has resolved but she now notices dry mouth, dry lips and her voice has changed.  She is very hoarse on examination, but has no stridor, breathing comfortably.  She is drinking in the exam room without any difficulty.  CT scan does not show any large fluid collections or swelling that would suggest airway compromise.  Will give dose of Decadron, continue prednisone and follow-up with her neurosurgeon as an outpatient.  Final Clinical Impressions(s) / ED Diagnoses   Final diagnoses:  Hoarseness of voice    ED Discharge Orders    None       Gilda Crease, MD 03/01/18 [phone]

## 2018-03-03 ENCOUNTER — Other Ambulatory Visit: Payer: Self-pay

## 2018-03-03 ENCOUNTER — Ambulatory Visit: Payer: Medicare HMO | Admitting: Family Medicine

## 2018-03-03 ENCOUNTER — Ambulatory Visit (INDEPENDENT_AMBULATORY_CARE_PROVIDER_SITE_OTHER): Payer: Medicare HMO | Admitting: Physician Assistant

## 2018-03-03 ENCOUNTER — Encounter: Payer: Self-pay | Admitting: Physician Assistant

## 2018-03-03 VITALS — BP 122/74 | HR 84 | Temp 98.3°F | Resp 17 | Ht 59.0 in | Wt 170.0 lb

## 2018-03-03 DIAGNOSIS — B359 Dermatophytosis, unspecified: Secondary | ICD-10-CM

## 2018-03-03 DIAGNOSIS — L03811 Cellulitis of head [any part, except face]: Secondary | ICD-10-CM

## 2018-03-03 MED ORDER — CLOTRIMAZOLE 1 % EX CREA: 1.0000 "application " | TOPICAL_CREAM | Freq: Two times a day (BID) | CUTANEOUS | 0 refills | Status: DC

## 2018-03-03 MED ORDER — DOXYCYCLINE HYCLATE 100 MG PO CAPS: 100.0000 mg | ORAL_CAPSULE | Freq: Two times a day (BID) | ORAL | 0 refills | Status: DC

## 2018-03-03 NOTE — Patient Instructions (Signed)
Please take the Doxycycline as directed for skin infection of the scalp. Keep skin clean and dry.  Avoid picking at the area.  Follow-up in 1 week for reassessment.   For the ringworm, use the prescription cream twice daily for up to 2 weeks. Follow-up if not continuing to resolve.

## 2018-03-03 NOTE — Progress Notes (Signed)
Patient presents to clinic today to discuss two issues.   Patient endorses ring-like rash of her lower R leg and R thigh, now with similar lesions of upper chest. Denies fever, chills. They are non painful or pruritic per patient. Denies sick contact. Denies change to soaps, lotions or detergents.   Patent also notes scratching an itchy spot in her scalp and causing a sore to develop. Was not originally painful but is now slightly tender. She is concerned about infection. Again denies fever, chills. .   Past Medical History:  Diagnosis Date  . Allergy   . Anxiety 2003  . Cough    for over a year per pt-  . Depression 1998  . Diabetes mellitus without complication ([facility]) 5681  . GERD (gastroesophageal reflux disease) 1980  . HLD (hyperlipidemia)   . Hyperlipidemia 1990  . Hypothyroidism 1998  . PONV (postoperative nausea and vomiting)    1 time per pt in 2001  . SOB (shortness of breath) on exertion     Current Outpatient Medications on File Prior to Visit  Medication Sig Dispense Refill  . aspirin EC 81 MG tablet Take 81 mg by mouth daily.    . clobetasol (TEMOVATE) 0.05 % external solution Apply 1 application topically at bedtime.   2  . cyclobenzaprine (FLEXERIL) 10 MG tablet TAKE 1 TABLET BY MOUTH THREE TIMES A DAY AS NEEDED FOR MUSCLE SPASMS (Patient taking differently: Take 10 mg by mouth 3 (three) times daily as needed for muscle spasms. ) 90 tablet 2  . empagliflozin (JARDIANCE) 25 MG TABS tablet Take 25 mg by mouth daily. 30 tablet 5  . gabapentin (NEURONTIN) 600 MG tablet Take 1,200 mg by mouth 3 (three) times daily.    Marland Kitchen HYDROcodone-acetaminophen (NORCO) 10-325 MG tablet Take 1 tablet by mouth every 4 (four) hours as needed for pain.    Marland Kitchen ibuprofen (ADVIL,MOTRIN) 200 MG tablet Take 800 mg by mouth every 4 (four) hours as needed for fever, headache, moderate pain or cramping.    . Insulin Pen Needle (CAREFINE PEN NEEDLES) 32G X 4 MM MISC Use as directed 100 each 11  .  Insulin Pen Needle (FIFTY50 PEN NEEDLES) 32G X 4 MM MISC Use as directed 100 each 11  . levothyroxine (SYNTHROID, LEVOTHROID) 112 MCG tablet Take 1 tablet (112 mcg total) by mouth daily. 90 tablet 1  . metFORMIN (GLUCOPHAGE) 1000 MG tablet Take 1 tablet (1,000 mg total) by mouth 2 (two) times daily with a meal. 60 tablet 5  . pantoprazole (PROTONIX) 40 MG tablet Take 1 tablet (40 mg total) by mouth daily. 90 tablet 3  . PARoxetine (PAXIL) 40 MG tablet Take 1 tablet (40 mg total) by mouth daily. 90 tablet 1  . pravastatin (PRAVACHOL) 40 MG tablet TAKE 1 TABLET BY MOUTH DAILY WITH SUPPER (Patient taking differently: Take 40 mg by mouth daily. ) 90 tablet 1  . predniSONE (STERAPRED UNI-PAK 21 TAB) 5 MG (21) TBPK tablet TAKE 6 TABLETS ON DAY 1 AS DIRECTED ON PACKAGE AND DECREASE BY 1 TAB EACH DAY FOR A TOTAL OF 6 DAYS  0  . TOUJEO SOLOSTAR 300 UNIT/ML SOPN Inject 44 Units into the skin at bedtime. 4 pen 3  . triamcinolone cream (KENALOG) 0.1 % Apply 1 application topically 2 (two) times daily. For 2 weeks, then as needed (Patient taking differently: Apply 1 application topically 2 (two) times daily as needed (skin irritation). ) 45 g 0  . lisinopril (PRINIVIL,ZESTRIL) 5 MG tablet  Take 1 tablet (5 mg total) by mouth daily. (Patient not taking: Reported on 02/28/2018) 90 tablet 3   No current facility-administered medications on file prior to visit.     Allergies  Allergen Reactions  . Prednisone Rash  . Toradol [Ketorolac Tromethamine] Other (See Comments)    Slurred speech, confusion  . Baclofen Other (See Comments)    Memory Loss and shakes  . Morphine And Related Itching  . Penicillins Rash    Has patient had a PCN reaction causing immediate rash, facial/tongue/throat swelling, SOB or lightheadedness with hypotension: Yes Has patient had a PCN reaction causing severe rash involving mucus membranes or skin necrosis: No Has patient had a PCN reaction that required hospitalization: No Has patient  had a PCN reaction occurring within the last 10 years: No If all of the above answers are "NO", then may proceed with Cephalosporin use.   Loma Messing [Pentazocine] Itching    Family History  Problem Relation Age of Onset  . Depression Mother   . Diabetes Mother   . Hypertension Mother   . Alcohol abuse Father   . Arthritis Father   . Cancer Father 75       Oral Cancer- had tongue, jaw resection--smoker and alcohol  . Alcohol abuse Brother     Social History   Socioeconomic History  . Marital status: Married    Spouse name: Not on file  . Number of children: 1  . Years of education: Not on file  . Highest education level: Not on file  Occupational History  . Occupation: Nurse  Social Needs  . Financial resource strain: Not on file  . Food insecurity:    Worry: Not on file    Inability: Not on file  . Transportation needs:    Medical: Not on file    Non-medical: Not on file  Tobacco Use  . Smoking status: Never Smoker  . Smokeless tobacco: Never Used  Substance and Sexual Activity  . Alcohol use: No    Alcohol/week: 0.0 standard drinks  . Drug use: No  . Sexual activity: Not Currently  Lifestyle  . Physical activity:    Days per week: Not on file    Minutes per session: Not on file  . Stress: Not on file  Relationships  . Social connections:    Talks on phone: Not on file    Gets together: Not on file    Attends religious service: Not on file    Active member of club or organization: Not on file    Attends meetings of clubs or organizations: Not on file    Relationship status: Not on file  Other Topics Concern  . Not on file  Social History Narrative   Separated. Son lives with her.    On Disability--secondary to back. On disability since 2012.   Did work as a Marine scientist at [employer]   Did drink heavy alcohol until October 1997- Quit and NO alcohol since.    Never smoked.     Review of Systems - See HPI.  All other ROS are  negative.  BP 122/74   Pulse 84   Temp 98.3 F (36.8 C) (Oral)   Resp 17   Ht 4' 11"  (1.499 m)   Wt 170 lb (77.1 kg)   SpO2 96%   BMI 34.34 kg/m   Physical Exam  Constitutional: She is oriented to person, place, and time. She appears well-developed and well-nourished.  HENT:  Head: Normocephalic.  Cardiovascular: Normal rate, regular rhythm, normal heart sounds and intact distal pulses.  Pulmonary/Chest: Effort normal and breath sounds normal.  Neurological: She is alert and oriented to person, place, and time.  Skin:     Psychiatric: She has a normal mood and affect.  Vitals reviewed.   Recent Results (from the past 2160 hour(s))  POCT glycosylated hemoglobin (Hb A1C)     Status: Abnormal   Collection Time: 12/19/17  1:06 PM  Result Value Ref Range   Hemoglobin A1C 7.3 (A) 4.0 - 5.6 %   HbA1c, POC (prediabetic range)  5.7 - 6.4 %   HbA1c, POC (controlled diabetic range)  0.0 - 7.0 %  TSH     Status: Abnormal   Collection Time: 12/19/17  1:36 PM  Result Value Ref Range   TSH 0.06 (L) 0.35 - 4.50 uIU/mL  CBC with Differential/Platelet     Status: Abnormal   Collection Time: 02/28/18  6:28 PM  Result Value Ref Range   WBC 7.7 4.0 - 10.5 K/uL   RBC 4.51 3.87 - 5.11 MIL/uL   Hemoglobin 11.2 (L) 12.0 - 15.0 g/dL   HCT 37.7 36.0 - 46.0 %   MCV 83.6 78.0 - 100.0 fL   MCH 24.8 (L) 26.0 - 34.0 pg   MCHC 29.7 (L) 30.0 - 36.0 g/dL   RDW 18.4 (H) 11.5 - 15.5 %   Platelets 302 150 - 400 K/uL   Neutrophils Relative % 87 %   Neutro Abs 6.7 1.7 - 7.7 K/uL   Lymphocytes Relative 11 %   Lymphs Abs 0.8 0.7 - 4.0 K/uL   Monocytes Relative 2 %   Monocytes Absolute 0.2 0.1 - 1.0 K/uL   Eosinophils Relative 0 %   Eosinophils Absolute 0.0 0.0 - 0.7 K/uL   Basophils Relative 0 %   Basophils Absolute 0.0 0.0 - 0.1 K/uL   Immature Granulocytes 0 %   Abs Immature Granulocytes 0.0 0.0 - 0.1 K/uL    Comment: Performed at [facility], [address] [place], [place] [address]    Basic metabolic panel     Status: Abnormal   Collection Time: 02/28/18  6:28 PM  Result Value Ref Range   Sodium 141 135 - 145 mmol/L   Potassium 4.2 3.5 - 5.1 mmol/L   Chloride 104 98 - 111 mmol/L   CO2 24 22 - 32 mmol/L   Glucose, Bld 204 (H) 70 - 99 mg/dL   BUN 9 8 - 23 mg/dL   Creatinine, Ser 0.81 0.44 - 1.00 mg/dL   Calcium 9.0 8.9 - 10.3 mg/dL   GFR calc non Af Amer >60 >60 mL/min   GFR calc Af Amer >60 >60 mL/min    Comment: (NOTE) The eGFR has been calculated using the CKD EPI equation. This calculation has not been validated in all clinical situations. eGFR's persistently <60 mL/min signify possible Chronic Kidney Disease.    Anion gap 13 5 - 15    Comment: Performed at [facility] [address], [place], [place] [address]    Assessment/Plan: 1. Cellulitis of scalp Supportive measures reviewed. Wound care discussed. Start Doxycycline 100 mg BID x 7 days. Follow-up 1 week.  - doxycycline (VIBRAMYCIN) 100 MG capsule; Take 1 capsule (100 mg total) by mouth 2 (two) times daily.  Dispense: 14 capsule; Refill: 0  2. Ringworm Start Clotrimazole BID x 2 weeks. Follow-up if not resolving.  - clotrimazole (LOTRIMIN) 1 % cream; Apply 1 application topically 2 (  two) times daily.  Dispense: 30 g; Refill: 0   Leeanne Rio, PA-C

## 2018-03-04 ENCOUNTER — Ambulatory Visit: Payer: Medicare Other | Admitting: Physician Assistant

## 2018-03-07 ENCOUNTER — Ambulatory Visit: Payer: Medicare Other | Admitting: Gastroenterology

## 2018-03-18 DIAGNOSIS — Z79899 Other long term (current) drug therapy: Secondary | ICD-10-CM | POA: Diagnosis not present

## 2018-03-18 DIAGNOSIS — G894 Chronic pain syndrome: Secondary | ICD-10-CM | POA: Diagnosis not present

## 2018-03-18 DIAGNOSIS — Z79891 Long term (current) use of opiate analgesic: Secondary | ICD-10-CM | POA: Diagnosis not present

## 2018-03-22 ENCOUNTER — Other Ambulatory Visit: Payer: Self-pay | Admitting: Family Medicine

## 2018-03-22 DIAGNOSIS — E119 Type 2 diabetes mellitus without complications: Secondary | ICD-10-CM

## 2018-03-22 DIAGNOSIS — K219 Gastro-esophageal reflux disease without esophagitis: Secondary | ICD-10-CM

## 2018-04-03 ENCOUNTER — Other Ambulatory Visit: Payer: Self-pay | Admitting: Family Medicine

## 2018-04-03 DIAGNOSIS — K219 Gastro-esophageal reflux disease without esophagitis: Secondary | ICD-10-CM

## 2018-04-12 ENCOUNTER — Other Ambulatory Visit: Payer: Self-pay | Admitting: Family Medicine

## 2018-04-15 ENCOUNTER — Other Ambulatory Visit: Payer: Self-pay | Admitting: Family Medicine

## 2018-04-23 ENCOUNTER — Other Ambulatory Visit: Payer: Self-pay | Admitting: Family Medicine

## 2018-04-28 ENCOUNTER — Ambulatory Visit (INDEPENDENT_AMBULATORY_CARE_PROVIDER_SITE_OTHER): Payer: 59 | Admitting: Family Medicine

## 2018-04-28 ENCOUNTER — Encounter: Payer: Self-pay | Admitting: Family Medicine

## 2018-04-28 ENCOUNTER — Other Ambulatory Visit: Payer: Self-pay

## 2018-04-28 VITALS — BP 142/88 | HR 96 | Temp 98.6°F | Ht 59.0 in | Wt 162.8 lb

## 2018-04-28 DIAGNOSIS — Z9889 Other specified postprocedural states: Secondary | ICD-10-CM | POA: Diagnosis not present

## 2018-04-28 DIAGNOSIS — B354 Tinea corporis: Secondary | ICD-10-CM

## 2018-04-28 DIAGNOSIS — F1190 Opioid use, unspecified, uncomplicated: Secondary | ICD-10-CM | POA: Diagnosis not present

## 2018-04-28 DIAGNOSIS — F1021 Alcohol dependence, in remission: Secondary | ICD-10-CM

## 2018-04-28 MED ORDER — KETOCONAZOLE 2 % EX CREA: 1.0000 "application " | TOPICAL_CREAM | Freq: Two times a day (BID) | CUTANEOUS | 0 refills | Status: AC

## 2018-04-28 MED ORDER — AMITRIPTYLINE HCL 25 MG PO TABS: 25.0000 mg | ORAL_TABLET | Freq: Every day | ORAL | 2 refills | Status: DC

## 2018-04-28 NOTE — Progress Notes (Signed)
Subjective  CC:  Chief Complaint  Patient presents with  . Rash    rash on both legs, and chest x 2 months     HPI: Marissa Thompson is a 67 y.o. female who presents to the office today to address the problems listed above in the chief complaint.  Marissa Thompson reports 45-month history of spreading rash.  Started as annular lesion on the left leg.  Now multiple lesions on leg and one on the anterior right upper chest.  Itching.  Red border.  Used over-the-counter creams without resolution.  No hives, papules, or systemic symptoms.  Diabetes: Overdue for diabetes visit.  She is had significant weight loss due to complications after anterior cervical discectomy.  Status post discectomy on opioids for the last 2 months.  Complains of neck pain shoulder pain and achiness.  She has history of alcoholism.  Complains of right upper back pain. Assessment  1. Tinea corporis   2. S/P cervical discectomy   3. Opioid use   4. History of alcoholism (HCC)      Plan   Tinea corporis: Trial of ketoconazole.  Recheck 2 weeks  Status post cervical discectomy still on opioids.  Worried about developing dependence/addiction.  Counseling done.  Will try to start weaning.  Add Elavil for nighttime sleep as this is her most worrisome and painful time of the evening.  Close follow-up.  She does have follow-up with surgeons in 2 weeks.  Follow-up for diabetes: To return in 2 weeks for recheck  Follow up: Return in about 2 weeks (around 05/12/2018) for follow up Diabetes, recheck.   No orders of the defined types were placed in this encounter.  Meds ordered this encounter  Medications  . ketoconazole (NIZORAL) 2 % cream    Sig: Apply 1 application topically 2 (two) times daily for 7 days. Then as needed    Dispense:  45 g    Refill:  0  . amitriptyline (ELAVIL) 25 MG tablet    Sig: Take 1 tablet (25 mg total) by mouth at bedtime.    Dispense:  30 tablet    Refill:  2      I reviewed the patients  updated PMH, FH, and SocHx.    Patient Active Problem List   Diagnosis Date Noted  . Benign essential HTN 08/29/2017    Priority: High  . Chronic recurrent major depressive disorder (HCC) 08/29/2017    Priority: High  . Type 2 diabetes mellitus without complications (HCC) 03/09/2015    Priority: High  . Memory loss 03/08/2015    Priority: High  . Anxiety     Priority: High  . Mixed hyperlipidemia     Priority: High  . Hypothyroidism     Priority: High  . Carpal tunnel syndrome on right 08/29/2016    Priority: Medium  . 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 Dupuytren's contracture of left hand 12/21/2015    Priority: Medium  . Dysphagia, pharyngoesophageal phase 12/29/2014    Priority: Medium  . GERD (gastroesophageal reflux disease)     Priority: Medium  . Vitamin D deficiency 03/03/2014    Priority: Low  . Allergy     Priority: Low  . History of alcoholism (HCC) 04/28/2018  . S/P cervical discectomy 04/28/2018  . Eczema of both external ears 12/19/2017   Current Meds  Medication Sig  . aspirin EC 81 MG tablet Take 81 mg by mouth daily.  . clobetasol (TEMOVATE) 0.05 % external  solution Apply 1 application topically at bedtime.   . clotrimazole (LOTRIMIN) 1 % cream Apply 1 application topically 2 (two) times daily.  . cyclobenzaprine (FLEXERIL) 10 MG tablet TAKE 1 TABLET BY MOUTH THREE TIMES A DAY AS NEEDED FOR MUSCLE SPASMS (Patient taking differently: Take 10 mg by mouth 3 (three) times daily as needed for muscle spasms. )  . empagliflozin (JARDIANCE) 25 MG TABS tablet Take 25 mg by mouth daily.  Marland Kitchen gabapentin (NEURONTIN) 600 MG tablet Take 1,200 mg by mouth 3 (three) times daily.  Marland Kitchen HYDROcodone-acetaminophen (NORCO) 10-325 MG tablet Take 1 tablet by mouth every 4 (four) hours as needed for pain.  Marland Kitchen ibuprofen (ADVIL,MOTRIN) 200 MG tablet Take 800 mg by mouth every 4 (four) hours as needed for fever, headache, moderate pain or cramping.  . Insulin Pen Needle  (CAREFINE PEN NEEDLES) 32G X 4 MM MISC Use as directed  . Insulin Pen Needle (FIFTY50 PEN NEEDLES) 32G X 4 MM MISC Use as directed  . levothyroxine (SYNTHROID, LEVOTHROID) 112 MCG tablet Take 1 tablet (112 mcg total) by mouth daily.  . metFORMIN (GLUCOPHAGE) 1000 MG tablet TAKE 1 TABLET BY MOUTH TWICE A DAY WITH MEALS  . pantoprazole (PROTONIX) 40 MG tablet TAKE 1 TABLET BY MOUTH EVERY DAY  . PARoxetine (PAXIL) 40 MG tablet Take 1 tablet (40 mg total) by mouth daily.  . pravastatin (PRAVACHOL) 40 MG tablet TAKE 1 TABLET BY MOUTH DAILY WITH SUPPER  . TOUJEO SOLOSTAR 300 UNIT/ML SOPN INJECT 40 UNITS INTO THE SKIN AT BEDTIME.  Marland Kitchen triamcinolone cream (KENALOG) 0.1 % Apply 1 application topically 2 (two) times daily. For 2 weeks, then as needed (Patient taking differently: Apply 1 application topically 2 (two) times daily as needed (skin irritation). )  . [DISCONTINUED] TOUJEO SOLOSTAR 300 UNIT/ML SOPN Inject 44 Units into the skin at bedtime.    Allergies: Patient is allergic to prednisone; toradol [ketorolac tromethamine]; baclofen; morphine and related; penicillins; and talwin [pentazocine]. Family History: Patient family history includes Alcohol abuse in her brother and father; Arthritis in her father; Cancer (age of onset: 51) in her father; Depression in her mother; Diabetes in her mother; Hypertension in her mother. Social History:  Patient  reports that she has never smoked. She has never used smokeless tobacco. She reports that she does not drink alcohol or use drugs.  Review of Systems: Constitutional: Negative for fever malaise or anorexia Cardiovascular: negative for chest pain Respiratory: negative for SOB or persistent cough Gastrointestinal: negative for abdominal pain  Objective  Vitals: BP (!) 142/88   Pulse 96   Temp 98.6 F (37 C)   Ht 4\' 11"  (1.499 m)   Wt 162 lb 12.8 oz (73.8 kg)   SpO2 98%   BMI 32.88 kg/m  General: no acute distress , A&Ox3, appears thinner HEENT:  PEERL, conjunctiva normal, Oropharynx moist,neck is supple Cardiovascular:  RRR without murmur or gallop.  Respiratory:  Good breath sounds bilaterally, CTAB with normal respiratory effort Skin:  Warm, annular lesion on left lower extremity with flaking and some excoriation, similar lesions on right upper chest.  No hives or vesicles Back: Supple neck: No spinal tenderness, right rhomboids tender.  Good shoulder internal and external rotation.  Normal strength.     Commons side effects, risks, benefits, and alternatives for medications and treatment plan prescribed today were discussed, and the patient expressed understanding of the given instructions. Patient is instructed to call or message via MyChart if he/she has any questions or concerns regarding  our treatment plan. No barriers to understanding were identified. We discussed Red Flag symptoms and signs in detail. Patient expressed understanding regarding what to do in case of urgent or emergency type symptoms.   Medication list was reconciled, printed and provided to the patient in AVS. Patient instructions and summary information was reviewed with the patient as documented in the AVS. This note was prepared with assistance of Dragon voice recognition software. Occasional wrong-word or sound-a-like substitutions may have occurred due to the inherent limitations of voice recognition software

## 2018-04-28 NOTE — Patient Instructions (Addendum)
Please return in 2 weeks for diabetes recheck and recheck rash.   Start weaning off of your narcotics! Start taking elavil 25mg  nightly.   If you have any questions or concerns, please don't hesitate to send me a message via MyChart or call the office at [phone]. Thank you for visiting with [place] today! It's our pleasure caring for you.

## 2018-04-29 ENCOUNTER — Other Ambulatory Visit: Payer: Self-pay | Admitting: Family Medicine

## 2018-04-29 DIAGNOSIS — K219 Gastro-esophageal reflux disease without esophagitis: Secondary | ICD-10-CM

## 2018-05-13 ENCOUNTER — Ambulatory Visit (INDEPENDENT_AMBULATORY_CARE_PROVIDER_SITE_OTHER): Payer: Medicare HMO | Admitting: Family Medicine

## 2018-05-13 ENCOUNTER — Encounter: Payer: Self-pay | Admitting: Family Medicine

## 2018-05-13 ENCOUNTER — Other Ambulatory Visit: Payer: Self-pay

## 2018-05-13 VITALS — BP 122/72 | HR 95 | Ht 59.0 in

## 2018-05-13 DIAGNOSIS — I10 Essential (primary) hypertension: Secondary | ICD-10-CM | POA: Diagnosis not present

## 2018-05-13 DIAGNOSIS — E119 Type 2 diabetes mellitus without complications: Secondary | ICD-10-CM | POA: Diagnosis not present

## 2018-05-13 DIAGNOSIS — L219 Seborrheic dermatitis, unspecified: Secondary | ICD-10-CM

## 2018-05-13 DIAGNOSIS — E039 Hypothyroidism, unspecified: Secondary | ICD-10-CM

## 2018-05-13 DIAGNOSIS — Z1159 Encounter for screening for other viral diseases: Secondary | ICD-10-CM

## 2018-05-13 DIAGNOSIS — L920 Granuloma annulare: Secondary | ICD-10-CM | POA: Insufficient documentation

## 2018-05-13 DIAGNOSIS — E2839 Other primary ovarian failure: Secondary | ICD-10-CM | POA: Diagnosis not present

## 2018-05-13 DIAGNOSIS — Z1239 Encounter for other screening for malignant neoplasm of breast: Secondary | ICD-10-CM | POA: Diagnosis not present

## 2018-05-13 LAB — POCT GLYCOSYLATED HEMOGLOBIN (HGB A1C): HEMOGLOBIN A1C: 6.6 % — AB (ref 4.0–5.6)

## 2018-05-13 LAB — MICROALBUMIN / CREATININE URINE RATIO
Creatinine,U: 66.2 mg/dL
MICROALB UR: 1 mg/dL (ref 0.0–1.9)
Microalb Creat Ratio: 1.5 mg/g (ref 0.0–30.0)

## 2018-05-13 LAB — TSH: TSH: 0.99 u[IU]/mL (ref 0.35–4.50)

## 2018-05-13 MED ORDER — CLOBETASOL PROPIONATE 0.05 % EX SOLN: 1.0000 "application " | Freq: Every day | CUTANEOUS | 2 refills | Status: DC

## 2018-05-13 MED ORDER — TRIAMCINOLONE ACETONIDE 0.1 % EX CREA: 1.0000 "application " | TOPICAL_CREAM | Freq: Two times a day (BID) | CUTANEOUS | 0 refills | Status: DC

## 2018-05-13 MED ORDER — LISINOPRIL 5 MG PO TABS
2.5000 mg | ORAL_TABLET | Freq: Every day | ORAL | 3 refills | Status: DC
Start: 2018-05-13 — End: 2023-09-09

## 2018-05-13 NOTE — Patient Instructions (Addendum)
Please return in 3 months for your annual complete physical; please come fasting. Also due for AWV at any time.   We will call you with information regarding your referral appointment. [facility].  If you do not hear from us within the next 2 weeks, please let me know. It can take 1-2 weeks to get appointments set up with the specialists.   Try the creams for your rash. You may need to see dermatology if it is due to granuloma annulare.   Medicare recommends an Annual Wellness Visit for all patients. Please schedule this to be done with our Nurse Educator, Kim. This is an informative "talk" visit; it's goals are to ensure that your health care needs are being met and to give you education regarding avoiding falls, ensuring you are not suffering from depression or problems with memory or thinking, and to educate you on Advance Care Planning. It helps me take good care of you!  

## 2018-05-13 NOTE — Progress Notes (Signed)
Subjective  CC:  Chief Complaint  Patient presents with  . Diabetes    last a1c 12/19/2017 = 7.3, doing well   . Rash    still has rash on legs and chest    HPI: Marissa Thompson is a 67 y.o. female who presents to the office today for follow up of diabetes and problems listed above in the chief complaint.   Diabetes follow up: Her diabetic control is reported as Improved. Continues to follow diet, take meds and weight is stable and trending downward. No AEs. She denies exertional CP or SOB or symptomatic hypoglycemia. She denies foot sores or paresthesias. Needs eye exam:pt to schedule  Rash: seen two weeks ago and rash treated with ketoconazole; mildly better on legs but not on chest. No worsening or change in sxs  BP is well controlled but couldn't tolerate the lisinopril.felt lightheaded so stopped it.   Adjusted her thyroid meds; due for recheck. On 112 mcg daily and taking it daily. Feels well.   Has seb derm: would like refill of clobetasol. Has seen derm in past.   HM: due for mammo and dexa: now ready to schedule.   Assessment  1. Type 2 diabetes mellitus without complication, without long-term current use of insulin ([place])   2. Benign essential HTN   3. Acquired hypothyroidism   4. Seborrheic dermatitis of scalp   5. Granuloma annulare   6. Need for hepatitis C screening test   7. Breast cancer screening   8. Hypoestrogenism      Plan   Diabetes is currently very well controlled. Praised and congratualated. Continue same meds. Trial of 2.5 lisinopril. rec eye exam. Check urine MAC ratio.   BP is at goal w/o meds due to weight loss. Praised  Skin: ? Granuloma annulare. Will monitor. Trial of steroid cream. rec derm and bx if persists.   Refilled steroid solution for scalp  [facility]   Recheck thyroid levels.   Follow up: Return in about 3 months (around 08/13/2018) for complete physical.. Orders Placed This Encounter    Procedures  . MM DIGITAL SCREENING BILATERAL  . DG Bone Density  . TSH  . Microalbumin / creatinine urine ratio  . Hepatitis C antibody  . POCT glycosylated hemoglobin (Hb A1C)   Meds ordered this encounter  Medications  . triamcinolone cream (KENALOG) 0.1 %    Sig: Apply 1 application topically 2 (two) times daily. For 2 weeks, then as needed    Dispense:  45 g    Refill:  0  . clobetasol (TEMOVATE) 0.05 % external solution    Sig: Apply 1 application topically at bedtime.    Dispense:  50 mL    Refill:  2  . lisinopril (PRINIVIL,ZESTRIL) 5 MG tablet    Sig: Take 0.5 tablets (2.5 mg total) by mouth daily.    Dispense:  90 tablet    Refill:  3      Immunization History  Administered Date(s) Administered  . Influenza, High Dose Seasonal PF 08/29/2017, 04/03/2018  . Influenza-Unspecified 03/16/2013  . PPD Test 02/08/2014, 03/09/2015, 10/24/2015, 06/19/2016  . Pneumococcal Conjugate-13 08/29/2017  . Pneumococcal Polysaccharide-23 07/17/2011  . Td 07/17/2003  . Tdap 12/10/2013    Diabetes Related Lab Review: Lab Results  Component Value Date   HGBA1C 6.6 (A) 05/13/2018   HGBA1C 7.3 (A) 12/19/2017   HGBA1C 7.6 11/18/2017    Lab Results  Component Value Date   MICROALBUR 0.5  12/13/2016   Lab Results  Component Value Date   CREATININE 0.81 02/28/2018   BUN 9 02/28/2018   NA 141 02/28/2018   K 4.2 02/28/2018   CL 104 02/28/2018   CO2 24 02/28/2018   Lab Results  Component Value Date   CHOL 139 09/05/2017   CHOL 250 (H) 12/13/2016   CHOL 127 03/01/2016   Lab Results  Component Value Date   HDL 65.10 09/05/2017   HDL 93 12/13/2016   HDL 81 03/01/2016   Lab Results  Component Value Date   LDLCALC 46 09/05/2017   LDLCALC 119 (H) 12/13/2016   LDLCALC 23 03/01/2016   Lab Results  Component Value Date   TRIG 138.0 09/05/2017   TRIG 189 (H) 12/13/2016   TRIG 116 03/01/2016   Lab Results  Component Value Date   CHOLHDL 2 09/05/2017   CHOLHDL 2.7  12/13/2016   CHOLHDL 1.6 03/01/2016   No results found for: LDLDIRECT The 10-year ASCVD risk score Mikey Bussing DC Jr., et al., 2013) is: 11.2%   Values used to calculate the score:     Age: 48 years     Sex: Female     Is Non-Hispanic African American: No     Diabetic: Yes     Tobacco smoker: No     Systolic Blood Pressure: 591 mmHg     Is BP treated: Yes     HDL Cholesterol: 65.1 mg/dL     Total Cholesterol: 139 mg/dL I have reviewed the PMH, Fam and Soc history. Patient Active Problem List   Diagnosis Date Noted  . Benign essential HTN 08/29/2017    Priority: High  . Chronic recurrent major depressive disorder ([place]) 08/29/2017    Priority: High  . Type 2 diabetes mellitus without complications ([place]) 63/84/6659    Priority: High  . Memory loss 03/08/2015    Priority: High  . Anxiety     Priority: High  . Mixed hyperlipidemia     Priority: High  . Hypothyroidism     Priority: High  . Carpal tunnel syndrome on right 08/29/2016    Priority: Medium  . 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 Dupuytren's contracture of left hand 12/21/2015    Priority: Medium  . Dysphagia, pharyngoesophageal phase 12/29/2014    Priority: Medium    Nl EGD 2019   . GERD (gastroesophageal reflux disease)     Priority: Medium  . Vitamin D deficiency 03/03/2014    Priority: Low  . Allergy     Priority: Low  . Seborrheic dermatitis of scalp 05/13/2018  . 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  . History of alcoholism ([place]) 04/28/2018  . S/P cervical discectomy 04/28/2018  . Eczema of both external ears 12/19/2017    Social History: Patient  reports that she has never smoked. She has never used smokeless tobacco. She reports that she does not drink alcohol or use drugs.  Review of Systems: Ophthalmic: negative for eye pain, loss of vision or double vision Cardiovascular: negative for chest pain Respiratory: negative for SOB or persistent cough Gastrointestinal: negative for abdominal  pain Genitourinary: negative for dysuria or gross hematuria MSK: negative for foot lesions  Neurologic: negative for weakness or gait disturbance Wt Readings from Last 3 Encounters:  04/28/18 162 lb 12.8 oz (73.8 kg)  03/03/18 170 lb (77.1 kg)  02/28/18 174 lb 2.6 oz (79 kg)    Objective  Vitals: BP 122/72   Pulse 95   Ht _0  (1.499 m)  SpO2 96%   BMI 32.88 kg/m  General: well appearing, no acute distress  Psych:  Alert and oriented, normal mood and affect HEENT:  Normocephalic, atraumatic, moist mucous membranes, supple neck  Cardiovascular:  Nl S1 and S2, RRR without murmur, gallop or rub. no edema Respiratory:  Good breath sounds bilaterally, CTAB with normal effort, no rales Gastrointestinal: normal BS, soft, nontender Skin:  Warm, 3 annular lesions with raised borders: right upper chest and left leg Neurologic:   Mental status is normal. normal gait Foot exam: no erythema, pallor, or cyanosis visible nl proprioception and sensation to monofilament testing bilaterally, +2 distal pulses bilaterally    Diabetic education: ongoing education regarding chronic disease management for diabetes was given today. We continue to reinforce the ABC's of diabetic management: A1c (<7 or 8 dependent upon patient), tight blood pressure control, and cholesterol management with goal LDL < 100 minimally. We discuss diet strategies, exercise recommendations, medication options and possible side effects. At each visit, we review recommended immunizations and preventive care recommendations for diabetics and stress that good diabetic control can prevent other problems. See below for this patient's data.    Commons side effects, risks, benefits, and alternatives for medications and treatment plan prescribed today were discussed, and the patient expressed understanding of the given instructions. Patient is instructed to call or message via MyChart if he/she has any questions or concerns regarding  our treatment plan. No barriers to understanding were identified. We discussed Red Flag symptoms and signs in detail. Patient expressed understanding regarding what to do in case of urgent or emergency type symptoms.   Medication list was reconciled, printed and provided to the patient in AVS. Patient instructions and summary information was reviewed with the patient as documented in the AVS. This note was prepared with assistance of Dragon voice recognition software. Occasional wrong-word or sound-a-like substitutions may have occurred due to the inherent limitations of voice recognition software

## 2018-05-17 LAB — HCV RNA,QUANTITATIVE REAL TIME PCR
HCV Quantitative Log: <1.18 Log IU/mL
HCV RNA, PCR, QN: NOT DETECTED [IU]/mL

## 2018-05-17 LAB — HEPATITIS C ANTIBODY
HEP C AB: REACTIVE — AB
SIGNAL TO CUT-OFF: 1.27 — ABNORMAL HIGH (ref <1.00)

## 2018-05-20 NOTE — Progress Notes (Signed)
Please call patient: I have reviewed his/her lab results. Please ask pt if she has ever before been told or treated for Hepatitis C? If yes, then let me know. If no, then this is a false positive screen with a negative test and nothing else is needed.

## 2018-05-21 ENCOUNTER — Other Ambulatory Visit: Payer: Self-pay | Admitting: Family Medicine

## 2018-06-09 ENCOUNTER — Other Ambulatory Visit: Payer: Self-pay | Admitting: Family Medicine

## 2018-06-10 NOTE — Progress Notes (Deleted)
Subjective:   Marissa Thompson is a 67 y.o. female who presents for an Initial Medicare Annual Wellness Visit.  Review of Systems    No ROS.  Medicare Wellness Visit. Additional risk factors are reflected in the social history.    Sleep patterns:  Home Safety/Smoke Alarms: Feels safe in home. Smoke alarms in place.  Living environment; residence and Firearm Safety:  Seat Belt Safety/Bike Helmet: Wears seat belt.   Female:   Pap-N/A  Mammo-02/27/2017, Negative. Scheduled for 07/22/2018.        Dexa scan-       Scheduled for 07/22/2018.     CCS-Colonoscopy 07/25/2017, poor prep     Objective:    There were no vitals filed for this visit. There is no height or weight on file to calculate BMI.  Advanced Directives 02/28/2018 07/25/2017 05/05/2016 03/28/2016 03/08/2016 03/06/2016 08/12/2013  Does Patient Have a Medical Advance Directive? Yes No No Yes Yes Yes Patient does not have advance directive  Type of Advance Directive Living will - - - - Living will;Healthcare Power of Attorney -  Does patient want to make changes to medical advance directive? - - - No - Patient declined - - -  Copy of Healthcare Power of Attorney in Chart? - - - - No - copy requested - -    Current Medications (verified) Outpatient Encounter Medications as of 06/11/2018  Medication Sig  . amitriptyline (ELAVIL) 25 MG tablet Take 1 tablet (25 mg total) by mouth at bedtime.  Marland Kitchen aspirin EC 81 MG tablet Take 81 mg by mouth daily.  . clobetasol (TEMOVATE) 0.05 % external solution Apply 1 application topically at bedtime.  . clotrimazole (LOTRIMIN) 1 % cream Apply 1 application topically 2 (two) times daily.  . cyclobenzaprine (FLEXERIL) 10 MG tablet TAKE 1 TABLET BY MOUTH THREE TIMES A DAY AS NEEDED FOR MUSCLE SPASMS (Patient taking differently: Take 10 mg by mouth 3 (three) times daily as needed for muscle spasms. )  . empagliflozin (JARDIANCE) 25 MG TABS tablet Take 25 mg by mouth daily.  Marland Kitchen gabapentin  (NEURONTIN) 600 MG tablet Take 1,200 mg by mouth 3 (three) times daily.  Marland Kitchen HYDROcodone-acetaminophen (NORCO) 10-325 MG tablet Take 1 tablet by mouth every 4 (four) hours as needed for pain.  Marland Kitchen ibuprofen (ADVIL,MOTRIN) 200 MG tablet Take 800 mg by mouth every 4 (four) hours as needed for fever, headache, moderate pain or cramping.  . Insulin Pen Needle (CAREFINE PEN NEEDLES) 32G X 4 MM MISC Use as directed  . Insulin Pen Needle (FIFTY50 PEN NEEDLES) 32G X 4 MM MISC Use as directed  . levothyroxine (SYNTHROID, LEVOTHROID) 112 MCG tablet Take 1 tablet (112 mcg total) by mouth daily.  Marland Kitchen lisinopril (PRINIVIL,ZESTRIL) 5 MG tablet Take 0.5 tablets (2.5 mg total) by mouth daily.  . metFORMIN (GLUCOPHAGE) 1000 MG tablet TAKE 1 TABLET BY MOUTH TWICE A DAY WITH MEALS  . pantoprazole (PROTONIX) 40 MG tablet TAKE 1 TABLET BY MOUTH EVERY DAY  . PARoxetine (PAXIL) 40 MG tablet TAKE 1 TABLET BY MOUTH EVERY DAY  . pravastatin (PRAVACHOL) 40 MG tablet TAKE 1 TABLET BY MOUTH DAILY WITH SUPPER  . TOUJEO SOLOSTAR 300 UNIT/ML SOPN INJECT 40 UNITS INTO THE SKIN AT BEDTIME.  Marland Kitchen triamcinolone cream (KENALOG) 0.1 % Apply 1 application topically 2 (two) times daily. For 2 weeks, then as needed   No facility-administered encounter medications on file as of 06/11/2018.     Allergies (verified) Prednisone; Toradol [ketorolac tromethamine]; Baclofen; Morphine  and related; Penicillins; and Talwin [pentazocine]   History: Past Medical History:  Diagnosis Date  . Allergy   . Anxiety 2003  . Cough    for over a year per pt-  . Depression 1998  . Diabetes mellitus without complication (HCC) 1998  . GERD (gastroesophageal reflux disease) 1980  . HLD (hyperlipidemia)   . Hyperlipidemia 1990  . Hypothyroidism 1998  . PONV (postoperative nausea and vomiting)    1 time per pt in 2001  . SOB (shortness of breath) on exertion    Past Surgical History:  Procedure Laterality Date  . ANTERIOR CERVICAL DISCECTOMY  02/2018    . BACK SURGERY     x3 Lumbar  . CARPAL TUNNEL RELEASE Left 03/08/2016   Procedure: LEFT CARPAL TUNNEL RELEASE;  Surgeon: Betha LoaKevin Kuzma, MD;  Location: [facility];  Service: Orthopedics;  Laterality: Left;  . CERVICAL POLYPECTOMY N/A 08/17/2013   Procedure: CERVICAL POLYPECTOMY;  Surgeon: Mickel Baasichard D Kaplan, MD;  Location: [facility];  Service: Gynecology;  Laterality: N/A;  . COLON SURGERY  2007   benign mass  . ECTOPIC PREGNANCY SURGERY    . ESOPHAGEAL DILATION     x3  . HYSTEROSCOPY W/D&C N/A 08/17/2013   Procedure: DILATATION AND CURETTAGE /HYSTEROSCOPY;  Surgeon: Mickel Baasichard D Kaplan, MD;  Location: [facility];  Service: Gynecology;  Laterality: N/A;  YAG LASER  . NECK SURGERY     c5-7 ACDF 3/15/has screws and plate in neck  . TONSILLECTOMY    . TRIGGER FINGER RELEASE Left 03/08/2016   Procedure: LEFT LONG TRIGGER RELEASE AND LEFT RING TRIGGER RELEASE;  Surgeon: Betha LoaKevin Kuzma, MD;  Location: [facility];  Service: Orthopedics;  Laterality: Left;  . UPPER GASTROINTESTINAL ENDOSCOPY     Family History  Problem Relation Age of Onset  . Depression Mother   . Diabetes Mother   . Hypertension Mother   . Alcohol abuse Father   . Arthritis Father   . Cancer Father [phone]       Oral Cancer- had tongue, jaw resection--smoker and alcohol  . Alcohol abuse Brother    Social History   Socioeconomic History  . Marital status: Married    Spouse name: Not on file  . Number of children: 1  . Years of education: Not on file  . Highest education level: Not on file  Occupational History  . Occupation: Nurse  Social Needs  . Financial resource strain: Not on file  . Food insecurity:    Worry: Not on file    Inability: Not on file  . Transportation needs:    Medical: Not on file    Non-medical: Not on file  Tobacco Use  . Smoking status: Never Smoker  . Smokeless tobacco: Never Used  Substance and Sexual Activity  . Alcohol use: No    Alcohol/week: 0.0 standard drinks  . Drug  use: No  . Sexual activity: Not Currently  Lifestyle  . Physical activity:    Days per week: Not on file    Minutes per session: Not on file  . Stress: Not on file  Relationships  . Social connections:    Talks on phone: Not on file    Gets together: Not on file    Attends religious service: Not on file    Active member of club or organization: Not on file    Attends meetings of clubs or organizations: Not on file    Relationship status: Not on file  Other Topics Concern  .  Not on file  Social History Narrative   Separated. Son lives with her.    On Disability--secondary to back. On disability since 2012.   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    Never smoked.     Tobacco Counseling Counseling given: Not Answered   Activities of Daily Living In your present state of health, do you have any difficulty performing the following activities: 08/29/2017  Hearing? N  Vision? N  Difficulty concentrating or making decisions? N  Walking or climbing stairs? N  Dressing or bathing? N  Doing errands, shopping? N  Some recent data might be hidden     Immunizations and Health Maintenance Immunization History  Administered Date(s) Administered  . Influenza, High Dose Seasonal PF 08/29/2017, 04/03/2018  . Influenza-Unspecified 03/16/2013  . PPD Test 02/08/2014, 03/09/2015, 10/24/2015, 06/19/2016  . Pneumococcal Conjugate-13 08/29/2017  . Pneumococcal Polysaccharide-23 07/17/2011  . Td 07/17/2003  . Tdap 12/10/2013   Health Maintenance Due  Topic Date Due  . OPHTHALMOLOGY EXAM  08/04/2016  . MAMMOGRAM  02/27/2018    Patient Care Team: Willow Ora, MD as PCP - General (Family Medicine) Patricia Nettle, MD as Consulting Physician (Orthopedic Surgery)  Indicate any recent Medical Services you may have received from other than [facility] providers in the past year (date may be  approximate).     Assessment:   This is a routine wellness examination for Ritika.  Hearing/Vision screen No exam data present  Dietary issues and exercise activities discussed:   Diet (meal preparation, eat out, water intake, caffeinated beverages, dairy products, fruits and vegetables):   Breakfast: Lunch:  Dinner:      Goals   None    Depression Screen PHQ 2/9 Scores 03/03/2018 09/05/2017 08/29/2017 03/21/2017 12/13/2016 12/10/2013  PHQ - 2 Score 1 5 5  0 5 0  PHQ- 9 Score 3 19 12  - 8 -    Fall Risk Fall Risk  08/29/2017 03/21/2017  Falls in the past year? No No     Cognitive Function: MMSE - Mini Mental State Exam 03/08/2015  Orientation to time 5  Orientation to Place 5  Registration 3  Attention/ Calculation 5  Recall 3  Language- name 2 objects 2  Language- repeat 1  Language- follow 3 step command 3  Language- read & follow direction 1  Write a sentence 1  Copy design 1  Total score 30        Screening Tests Health Maintenance  Topic Date Due  . OPHTHALMOLOGY EXAM  08/04/2016  . MAMMOGRAM  02/27/2018  . DEXA SCAN  12/20/2018 (Originally 04/18/2017)  . PNA vac Low Risk Adult (2 of 2 - PPSV23) 08/29/2018  . HEMOGLOBIN A1C  11/12/2018  . FOOT EXAM  11/19/2018  . TETANUS/TDAP  12/11/2023  . COLONOSCOPY  07/26/2027  . INFLUENZA VACCINE  Completed  . Hepatitis C Screening  Completed        Plan:     I have personally reviewed and noted the following in the patient's chart:   . Medical and social history . Use of alcohol, tobacco or illicit drugs  . Current medications and supplements . Functional ability and status . Nutritional status . Physical activity . Advanced directives . List of other physicians . Hospitalizations, surgeries, and ER visits in previous 12 months . Vitals . Screenings to include cognitive, depression, and falls . Referrals and appointments  In addition, I have reviewed and discussed with patient certain preventive  protocols, quality metrics, and best practice recommendations. A written personalized care plan for preventive services as well as general preventive health recommendations were provided to patient.     Alysia Penna, RN   06/10/2018

## 2018-06-11 ENCOUNTER — Ambulatory Visit: Payer: Medicare HMO

## 2018-06-17 ENCOUNTER — Other Ambulatory Visit: Payer: Self-pay | Admitting: Family Medicine

## 2018-06-27 ENCOUNTER — Other Ambulatory Visit: Payer: Self-pay | Admitting: Family Medicine

## 2018-07-02 ENCOUNTER — Other Ambulatory Visit: Payer: Self-pay | Admitting: Family Medicine

## 2018-07-02 DIAGNOSIS — E119 Type 2 diabetes mellitus without complications: Secondary | ICD-10-CM

## 2018-07-02 DIAGNOSIS — H5210 Myopia, unspecified eye: Secondary | ICD-10-CM | POA: Diagnosis not present

## 2018-07-03 ENCOUNTER — Other Ambulatory Visit: Payer: Self-pay | Admitting: Emergency Medicine

## 2018-07-03 MED ORDER — CYCLOBENZAPRINE HCL 10 MG PO TABS: 10.0000 mg | ORAL_TABLET | Freq: Three times a day (TID) | ORAL | 2 refills | Status: DC | PRN

## 2018-07-03 NOTE — Telephone Encounter (Signed)
Please advise if this is a regular medication to continue.

## 2018-07-22 ENCOUNTER — Ambulatory Visit: Payer: Medicare HMO

## 2018-07-22 ENCOUNTER — Other Ambulatory Visit: Payer: Medicare HMO

## 2018-07-24 DIAGNOSIS — M4322 Fusion of spine, cervical region: Secondary | ICD-10-CM | POA: Diagnosis not present

## 2018-07-25 ENCOUNTER — Other Ambulatory Visit: Payer: Self-pay | Admitting: Family Medicine

## 2018-07-29 ENCOUNTER — Ambulatory Visit: Payer: Medicare HMO | Admitting: Family Medicine

## 2018-07-29 DIAGNOSIS — Z0289 Encounter for other administrative examinations: Secondary | ICD-10-CM

## 2018-07-30 ENCOUNTER — Other Ambulatory Visit: Payer: Self-pay | Admitting: Family Medicine

## 2018-07-30 DIAGNOSIS — K219 Gastro-esophageal reflux disease without esophagitis: Secondary | ICD-10-CM

## 2018-07-31 ENCOUNTER — Ambulatory Visit (INDEPENDENT_AMBULATORY_CARE_PROVIDER_SITE_OTHER): Payer: Medicare HMO | Admitting: Family Medicine

## 2018-07-31 ENCOUNTER — Other Ambulatory Visit: Payer: Self-pay

## 2018-07-31 ENCOUNTER — Encounter: Payer: Self-pay | Admitting: Family Medicine

## 2018-07-31 VITALS — BP 126/82 | HR 66 | Temp 98.7°F | Resp 18 | Ht 59.0 in | Wt 159.6 lb

## 2018-07-31 DIAGNOSIS — I10 Essential (primary) hypertension: Secondary | ICD-10-CM

## 2018-07-31 DIAGNOSIS — E039 Hypothyroidism, unspecified: Secondary | ICD-10-CM

## 2018-07-31 DIAGNOSIS — Z794 Long term (current) use of insulin: Secondary | ICD-10-CM | POA: Diagnosis not present

## 2018-07-31 DIAGNOSIS — Z0000 Encounter for general adult medical examination without abnormal findings: Secondary | ICD-10-CM

## 2018-07-31 DIAGNOSIS — H3530 Unspecified macular degeneration: Secondary | ICD-10-CM

## 2018-07-31 DIAGNOSIS — E782 Mixed hyperlipidemia: Secondary | ICD-10-CM | POA: Diagnosis not present

## 2018-07-31 DIAGNOSIS — R21 Rash and other nonspecific skin eruption: Secondary | ICD-10-CM | POA: Diagnosis not present

## 2018-07-31 DIAGNOSIS — F339 Major depressive disorder, recurrent, unspecified: Secondary | ICD-10-CM

## 2018-07-31 DIAGNOSIS — E119 Type 2 diabetes mellitus without complications: Secondary | ICD-10-CM

## 2018-07-31 DIAGNOSIS — Z0001 Encounter for general adult medical examination with abnormal findings: Secondary | ICD-10-CM | POA: Diagnosis not present

## 2018-07-31 HISTORY — DX: Unspecified macular degeneration: H35.30

## 2018-07-31 LAB — POCT GLYCOSYLATED HEMOGLOBIN (HGB A1C): Hemoglobin A1C: 6.5 % — AB (ref 4.0–5.6)

## 2018-07-31 LAB — MICROALBUMIN / CREATININE URINE RATIO
Creatinine,U: 55.1 mg/dL
Microalb Creat Ratio: 1.3 mg/g (ref 0.0–30.0)
Microalb, Ur: <0.7 mg/dL (ref 0.0–1.9)

## 2018-07-31 LAB — COMPREHENSIVE METABOLIC PANEL
ALBUMIN: 4.2 g/dL (ref 3.5–5.2)
ALT: 16 U/L (ref 0–35)
AST: 21 U/L (ref 0–37)
Alkaline Phosphatase: 128 U/L — ABNORMAL HIGH (ref 39–117)
BILIRUBIN TOTAL: 0.4 mg/dL (ref 0.2–1.2)
BUN: 8 mg/dL (ref 6–23)
CALCIUM: 9 mg/dL (ref 8.4–10.5)
CO2: 28 meq/L (ref 19–32)
CREATININE: 0.7 mg/dL (ref 0.40–1.20)
Chloride: 106 mEq/L (ref 96–112)
GFR: 83.65 mL/min (ref >60.00)
GLUCOSE: 98 mg/dL (ref 70–99)
Potassium: 4.1 mEq/L (ref 3.5–5.1)
Sodium: 143 mEq/L (ref 135–145)
Total Protein: 6.7 g/dL (ref 6.0–8.3)

## 2018-07-31 LAB — CBC WITH DIFFERENTIAL/PLATELET
BASOS ABS: 0 10*3/uL (ref 0.0–0.1)
BASOS PCT: 0.3 % (ref 0.0–3.0)
EOS ABS: 0.1 10*3/uL (ref 0.0–0.7)
Eosinophils Relative: 1.5 % (ref 0.0–5.0)
HCT: 38.2 % (ref 36.0–46.0)
Hemoglobin: 12.2 g/dL (ref 12.0–15.0)
LYMPHS ABS: 1.3 10*3/uL (ref 0.7–4.0)
Lymphocytes Relative: 24.2 % (ref 12.0–46.0)
MCHC: 32 g/dL (ref 30.0–36.0)
MCV: 76 fl — AB (ref 78.0–100.0)
Monocytes Absolute: 0.4 10*3/uL (ref 0.1–1.0)
Monocytes Relative: 7 % (ref 3.0–12.0)
NEUTROS ABS: 3.7 10*3/uL (ref 1.4–7.7)
NEUTROS PCT: 67 % (ref 43.0–77.0)
PLATELETS: 311 10*3/uL (ref 150.0–400.0)
RBC: 5.02 Mil/uL (ref 3.87–5.11)
RDW: 19.7 % — AB (ref 11.5–15.5)
WBC: 5.5 10*3/uL (ref 4.0–10.5)

## 2018-07-31 LAB — TSH: TSH: 0.97 u[IU]/mL (ref 0.35–4.50)

## 2018-07-31 LAB — LIPID PANEL
CHOL/HDL RATIO: 2
Cholesterol: 141 mg/dL (ref 0–200)
HDL: 71.9 mg/dL (ref >39.00)
LDL CALC: 46 mg/dL (ref 0–99)
NONHDL: 68.88
Triglycerides: 113 mg/dL (ref 0.0–149.0)
VLDL: 22.6 mg/dL (ref 0.0–40.0)

## 2018-07-31 NOTE — Progress Notes (Signed)
Subjective  Chief Complaint  Patient presents with  . Annual Exam  . Diabetes  . Hypothyroidism  . Bronchitis    Started 07/28/17  . Hypertension  . Hyperlipidemia    HPI: Marissa Thompson is a 68 y.o. female who presents to [facility] today for a Female Wellness Visit. She also has the concerns and/or needs as listed above in the chief complaint. These will be addressed in addition to the Health Maintenance Visit.   Wellness Visit: annual visit with health maintenance review and exam without Pap   Health maintenance: Patient has mammogram and bone density scheduled for next month.  Recently had eye exam.  Had an early cataract on the left and macular degeneration.  Negative for retinopathy.  Will request records.  Fasting for lab work today.  She is due a Pneumovax. Chronic disease f/u and/or acute problem visit: (deemed necessary to be done in addition to the wellness visit):  Hyperlipidemia on statin.  Due for recheck.  Check LFTs.  Tolerating without myalgias reports good compliance  Diabetes follow up: Her diabetic control is reported as Unchanged.  Continues on her diabetic medications without adverse effects.  Denies symptoms of hyperglycemia.  Was supposed to start low-dose ACE inhibitor for renal protection at last visit, however she has not.  She has the medicines at home.  She says she is willing to take it.  Diabetic eye exam updated as above. She denies exertional CP or SOB or symptomatic hypoglycemia. She denies foot sores or paresthesias.  Chronic major recurrent depression: Mildly active.  Complains of restless leg symptoms, neck pain, fatigue.  These may be triggers for her low mood.  No suicidal ideation.  Review of symptoms positive for easy bruising  Rash: Have tried ketoconazole and steroid creams.  Continues to spread.  See last note.  [facility] 07/31/2018 03/03/2018 09/05/2017 08/29/2017 03/21/2017  Decreased Interest 3  1 3 3  0  Down, Depressed, Hopeless 2 0 2 2 0  PHQ - 2 Score 5 1 5 5  0  Altered sleeping 1 0 3 1 -  Tired, decreased energy 1 1 3 3  -  Change in appetite 1 1 3 3  -  Feeling bad or failure about yourself  0 0 1 0 -  Trouble concentrating 0 0 3 0 -  Moving slowly or fidgety/restless 0 0 1 0 -  Suicidal thoughts 0 0 0 0 -  PHQ-9 Score 8 3 19 12  -  Difficult doing work/chores Somewhat difficult Somewhat difficult Extremely dIfficult Somewhat difficult -    Immunization History  Administered Date(s) Administered  . Influenza, High Dose Seasonal PF 08/29/2017, 04/03/2018  . Influenza-Unspecified 03/16/2013  . PPD Test 02/08/2014, 03/09/2015, 10/24/2015, 06/19/2016  . Pneumococcal Conjugate-13 08/29/2017  . Pneumococcal Polysaccharide-23 07/17/2011, 07/31/2018  . Td 07/17/2003  . Tdap 12/10/2013    Diabetes Related Lab Review: Lab Results  Component Value Date   HGBA1C 6.5 (A) 07/31/2018   Lab Results  Component Value Date   MICROALBUR 1.0 05/13/2018   Lab Results  Component Value Date   CREATININE 0.81 02/28/2018   BUN 9 02/28/2018   NA 141 02/28/2018   K 4.2 02/28/2018   CL 104 02/28/2018   CO2 24 02/28/2018   Lab Results  Component Value Date   CHOL 139 09/05/2017   CHOL 250 (H) 12/13/2016   CHOL 127 03/01/2016   Lab Results  Component Value Date   HDL 65.10 09/05/2017  HDL 93 12/13/2016   HDL 81 03/01/2016   Lab Results  Component Value Date   LDLCALC 46 09/05/2017   LDLCALC 119 (H) 12/13/2016   LDLCALC 23 03/01/2016   Lab Results  Component Value Date   TRIG 138.0 09/05/2017   TRIG 189 (H) 12/13/2016   TRIG 116 03/01/2016   Lab Results  Component Value Date   CHOLHDL 2 09/05/2017   CHOLHDL 2.7 12/13/2016   CHOLHDL 1.6 03/01/2016   No results found for: LDLDIRECT The 10-year ASCVD risk score Denman George DC Jr., et al., 2013) is: 11.9%   Values used to calculate the score:     Age: 13 years     Sex: Female     Is Non-Hispanic African American: No      Diabetic: Yes     Tobacco smoker: No     Systolic Blood Pressure: 126 mmHg     Is BP treated: Yes     HDL Cholesterol: 65.1 mg/dL     Total Cholesterol: 139 mg/dL  Assessment  1. Annual physical exam   2. Benign essential HTN   3. Mixed hyperlipidemia   4. Type 2 diabetes mellitus without complication, with long-term current use of insulin (HCC)   5. Acquired hypothyroidism   6. Chronic recurrent major depressive disorder (HCC) Chronic  7. Macular degeneration, unspecified laterality, unspecified type   8. Rash      Plan  Female Wellness Visit:  Age appropriate Health Maintenance and Prevention measures were discussed with patient. Included topics are cancer screening recommendations, ways to keep healthy (see AVS) including dietary and exercise recommendations, regular eye and dental care, use of seat belts, and avoidance of moderate alcohol use and tobacco use.   BMI: discussed patient's BMI and encouraged positive lifestyle modifications to help get to or maintain a target BMI.  HM needs and immunizations were addressed and ordered. See below for orders. See HM and immunization section for updates.  Routine labs and screening tests ordered including cmp, cbc and lipids where appropriate.  Discussed recommendations regarding Vit D and calcium supplementation (see AVS)  Chronic disease management visit and/or acute problem visit:  Diabetes: Control remains good.  Updated Pneumovax today.  Continue current medications.  Reinforced need for low-dose ACE inhibitor if she is able to tolerate it.  Blood pressure is well controlled, normotensive.  Monitor for hypotension on ACE inhibitor.  She is had this problem in the past  Depression: Active.  Rec follow-up visit in 1 to 2 weeks to readdress.  Hyperlipidemia: Recheck today.  Continue statin.  Should be at goal.  Review of systems positive for easy bruising and restless leg symptoms.  Follow-up in the next week or 2 to further  discuss, assess, and treat.  Persistent spreading rash: Refer to dermatology. Follow up: Return in about 3 months (around 10/30/2018) for follow up Diabetes, mood follow up.  Orders Placed This Encounter  Procedures  . Pneumococcal polysaccharide vaccine 23-valent greater than or equal to 2yo subcutaneous/IM  . Comprehensive metabolic panel  . CBC with Differential/Platelet  . Lipid panel  . Microalbumin / creatinine urine ratio  . TSH  . Ambulatory referral to Dermatology  . POCT glycosylated hemoglobin (Hb A1C)   No orders of the defined types were placed in this encounter.     Lifestyle: Body mass index is 32.24 kg/m. Wt Readings from Last 3 Encounters:  07/31/18 159 lb 9.6 oz (72.4 kg)  04/28/18 162 lb 12.8 oz (73.8 kg)  03/03/18 170  lb (77.1 kg)     Patient Active Problem List   Diagnosis Date Noted  . Benign essential HTN 08/29/2017    Priority: High  . Chronic recurrent major depressive disorder (HCC) 08/29/2017    Priority: High  . Type 2 diabetes mellitus without complications (HCC) 03/09/2015    Priority: High  . Memory loss 03/08/2015    Priority: High  . Anxiety     Priority: High  . Mixed hyperlipidemia     Priority: High  . Hypothyroidism     Priority: High  . Macular degeneration 07/31/2018    Priority: Medium    2020   . Carpal tunnel syndrome on right 08/29/2016    Priority: Medium  . 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 Dupuytren's contracture of left hand 12/21/2015    Priority: Medium  . Dysphagia, pharyngoesophageal phase 12/29/2014    Priority: Medium    Nl EGD 2019   . GERD (gastroesophageal reflux disease)     Priority: Medium  . Vitamin D deficiency 03/03/2014    Priority: Low  . Allergy     Priority: Low  . Seborrheic dermatitis of scalp 05/13/2018  . 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  . History of alcoholism (HCC) 04/28/2018  . S/P cervical discectomy 04/28/2018  . Eczema of both external ears 12/19/2017    Health Maintenance  Topic Date Due  . MAMMOGRAM  02/27/2018  . DEXA SCAN  12/20/2018 (Originally 04/18/2017)  . PNA vac Low Risk Adult (2 of 2 - PPSV23) 08/29/2018  . HEMOGLOBIN A1C  11/12/2018  . FOOT EXAM  11/19/2018  . OPHTHALMOLOGY EXAM  07/17/2019  . TETANUS/TDAP  12/11/2023  . COLONOSCOPY  07/26/2027  . INFLUENZA VACCINE  Completed  . Hepatitis C Screening  Completed   Immunization History  Administered Date(s) Administered  . Influenza, High Dose Seasonal PF 08/29/2017, 04/03/2018  . Influenza-Unspecified 03/16/2013  . PPD Test 02/08/2014, 03/09/2015, 10/24/2015, 06/19/2016  . Pneumococcal Conjugate-13 08/29/2017  . Pneumococcal Polysaccharide-23 07/17/2011, 07/31/2018  . Td 07/17/2003  . Tdap 12/10/2013   We updated and reviewed the patient's past history in detail and it is documented below. Allergies: Patient is allergic to prednisone; toradol [ketorolac tromethamine]; baclofen; morphine and related; penicillins; and talwin [pentazocine]. Past Medical History Patient  has a past medical history of Allergy, Anxiety (2003), Cough, Depression (1998), Diabetes mellitus without complication (HCC) (1998), GERD (gastroesophageal reflux disease) (1980), HLD (hyperlipidemia), Hyperlipidemia (1990), Hypothyroidism (1998), Macular degeneration (07/31/2018), PONV (postoperative nausea and vomiting), and SOB (shortness of breath) on exertion. Past Surgical History Patient  has a past surgical history that includes Tonsillectomy; Ectopic pregnancy surgery; Back surgery; Hysteroscopy w/D&C (N/A, 08/17/2013); Cervical polypectomy (N/A, 08/17/2013); Neck surgery; Carpal tunnel release (Left, 03/08/2016); Trigger finger release (Left, 03/08/2016); Colon surgery (2007); Upper gastrointestinal endoscopy; Esophageal dilation; and Anterior cervical discectomy (02/2018). Family History: Patient family history includes Alcohol abuse in her brother and father; Arthritis in her father; Cancer (age of  onset: 1562) in her father; Depression in her mother; Diabetes in her mother; Hypertension in her mother. Social History:  Patient  reports that she has never smoked. She has never used smokeless tobacco. She reports that she does not drink alcohol or use drugs.  Review of Systems: Constitutional: negative for fever or malaise Ophthalmic: negative for photophobia, double vision or loss of vision Cardiovascular: negative for chest pain, dyspnea on exertion, or new LE swelling Respiratory: negative for SOB or persistent cough Gastrointestinal: negative for abdominal pain, change in bowel habits  or melena Genitourinary: negative for dysuria or gross hematuria, no abnormal uterine bleeding or disharge Musculoskeletal: negative for new gait disturbance or muscular weakness Integumentary: negative for new or persistent rashes, no breast lumps Neurological: negative for TIA or stroke symptoms Psychiatric: negative for SI or delusions Allergic/Immunologic: negative for hives  Patient Care Team    Relationship Specialty Notifications Start End  Willow Ora, MD PCP - General Family Medicine  08/29/17   Patricia Nettle, MD Consulting Physician Orthopedic Surgery  10/17/17     Objective  Vitals: BP 126/82   Pulse 66   Temp 98.7 F (37.1 C) (Oral)   Resp 18   Ht 4\' 11"  (1.499 m)   Wt 159 lb 9.6 oz (72.4 kg)   SpO2 96%   BMI 32.24 kg/m  General:  Well developed, well nourished, no acute distress  Psych:  Alert and orientedx3,normal mood and affect HEENT:  Normocephalic, atraumatic, non-icteric sclera, PERRL, oropharynx is clear without mass or exudate, supple neck without adenopathy, mass or thyromegaly Cardiovascular:  Normal S1, S2, RRR without gallop, rub or murmur, nondisplaced PMI Respiratory:  Good breath sounds bilaterally, CTAB with normal respiratory effort Gastrointestinal: normal bowel sounds, soft, non-tender, no noted masses. No HSM MSK: no deformities, contusions. Joints are  without erythema or swelling. Spine and CVA region are nontender Skin:  Warm, annular eczematous rashes on torso and extremities, several ecchymoses on abdomen and legs, no petechiae Neurologic:    Mental status is normal. CN 2-11 are normal. Gross motor and sensory exams are normal. Normal gait. No tremor Breast Exam: No mass, skin retraction or nipple discharge is appreciated in either breast. No axillary adenopathy. Fibrocystic changes are not noted   Commons side effects, risks, benefits, and alternatives for medications and treatment plan prescribed today were discussed, and the patient expressed understanding of the given instructions. Patient is instructed to call or message via MyChart if he/she has any questions or concerns regarding our treatment plan. No barriers to understanding were identified. We discussed Red Flag symptoms and signs in detail. Patient expressed understanding regarding what to do in case of urgent or emergency type symptoms.   Medication list was reconciled, printed and provided to the patient in AVS. Patient instructions and summary information was reviewed with the patient as documented in the AVS. This note was prepared with assistance of Dragon voice recognition software. Occasional wrong-word or sound-a-like substitutions may have occurred due to the inherent limitations of voice recognition software

## 2018-07-31 NOTE — Patient Instructions (Addendum)
Please return in 1-2 weeks to discuss your mood, restless leg symtpoms.  Please schedule an Annual Wellness visit.  Medicare recommends an Annual Wellness Visit for all patients. Please schedule this to be done with our Nurse Educator, Maudie Mercury. This is an informative "talk" visit; it's goals are to ensure that your health care needs are being met and to give you education regarding avoiding falls, ensuring you are not suffering from depression or problems with memory or thinking, and to educate you on Advance Care Planning. It helps me take good care of you!  I am referring your to dermatology for your rash.   I will release your lab results to you on your MyChart account with further instructions. Please reply with any questions.   Start taking your lisinopril please.  Keep taking your other medications. Your diabetes looks good.   If you have any questions or concerns, please don't hesitate to send me a message via MyChart or call the office at [phone]. Thank you for visiting with [place] today! It's our pleasure caring for you.
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
## 2018-08-08 ENCOUNTER — Ambulatory Visit (INDEPENDENT_AMBULATORY_CARE_PROVIDER_SITE_OTHER): Payer: Medicare HMO | Admitting: Family Medicine

## 2018-08-08 ENCOUNTER — Encounter: Payer: Self-pay | Admitting: Family Medicine

## 2018-08-08 VITALS — BP 120/68 | HR 88 | Temp 98.8°F | Resp 16 | Ht 59.0 in | Wt 160.8 lb

## 2018-08-08 DIAGNOSIS — F339 Major depressive disorder, recurrent, unspecified: Secondary | ICD-10-CM

## 2018-08-08 DIAGNOSIS — Z9889 Other specified postprocedural states: Secondary | ICD-10-CM | POA: Diagnosis not present

## 2018-08-08 DIAGNOSIS — F418 Other specified anxiety disorders: Secondary | ICD-10-CM

## 2018-08-08 DIAGNOSIS — G2581 Restless legs syndrome: Secondary | ICD-10-CM | POA: Diagnosis not present

## 2018-08-08 DIAGNOSIS — F430 Acute stress reaction: Secondary | ICD-10-CM | POA: Diagnosis not present

## 2018-08-08 MED ORDER — BUPROPION HCL ER (XL) 150 MG PO TB24: 150.0000 mg | ORAL_TABLET | Freq: Every day | ORAL | 5 refills | Status: DC

## 2018-08-08 MED ORDER — CLONAZEPAM 0.5 MG PO TABS: 0.5000 mg | ORAL_TABLET | Freq: Every day | ORAL | 1 refills | Status: DC | PRN

## 2018-08-08 NOTE — Patient Instructions (Addendum)
  Please return in 4-6 weeks to recheck your mood and new medications.   If you have any questions or concerns, please don't hesitate to send me a message via MyChart or call the office at [phone]. Thank you for visiting with [place] today! It's our pleasure caring for you.  I've ordered klonopin to take as needed for panic or anxiety.  We will add wellbutrin to your paxil to see if we can get you feeling better.   When you're going through tough times, it's easy to feel lonely and overwhelmed. Remember that YOU ARE NOT ALONE and we [facility] want to support you during this difficult time.  There are many ways to get help and support when you are ready.  You can call the following help lines: - National suicide hotline (3026775164) - National Hopeline Network (1-800-SUICIDE)   You can schedule an appointment with a provider here. We are all here to support you. A doctor is on call 24/7, so call if you need to. [phone]   There are many treatments that can help people during difficult times, and we can talk with you about medications, counseling, diet, and other options. We want to offer you HOPE that it won't always feel this bad.   If you are seriously thinking about hurting yourself or have a plan, please call 911 or go to any emergency room right away for immediate help.

## 2018-08-08 NOTE — Progress Notes (Signed)
_  Subjective  CC:  Chief Complaint  Patient presents with  . Depression    Not feeling any better  . Restless Leg    she reports that she is rubbing her ankles together and causing bruising    HPI: Marissa Thompson is a 68 y.o. female who presents to the office today to address the problems listed above in the chief complaint, mood problems.  Pt with longstanding depression; was active about 18 months ago. We adjusted meds: stopped zoloft and changed to paxil, failed elavil due to confusion; she did well until the last few months: now with anhedonia and low motivation again. Also very anxious and worrying due to can't find a job ([employer] but can't use a computer well), having worse neck pain s/p cervical spine surgery, also feel restless symptoms worse during the day but denies problems with sleep.  These symptoms have been progressive in nature.  She denies current suicidal or homicidal plan or intent.   Depression screen [facility] 2/9 07/31/2018 03/03/2018 09/05/2017  Decreased Interest 3 1 3   Down, Depressed, Hopeless 2 0 2  PHQ - 2 Score 5 1 5   Altered sleeping 1 0 3  Tired, decreased energy 1 1 3   Change in appetite 1 1 3   Feeling bad or failure about yourself  0 0 1  Trouble concentrating 0 0 3  Moving slowly or fidgety/restless 0 0 1  Suicidal thoughts 0 0 0  PHQ-9 Score 8 3 19   Difficult doing work/chores Somewhat difficult Somewhat difficult Extremely dIfficult   Recent cpe with labs: chronic problems are well controlled.   Assessment  1. Major depression, recurrent, chronic (HCC)   2. Stress reaction   3. Anxiety   4. S/P cervical discectomy      Plan   Depression:  Very active again,in part situational due to unemployment and financial worries. Counseling done. Due to active anxiety as well, add wellbutrin 150 daily, continue paxil and use klonopin if needed. Discussed risks, benefits and appropriate use of benzo. Short term only. Recheck 4-6 weeks.   Neck pain: contributing  to anxiety. Has f/u with [facility] for steroid injections.   RLS: pt defers medical treatment at this time. Will try klonopin first.   Follow up: No follow-ups on file.  No orders of the defined types were placed in this encounter.  Meds ordered this encounter  Medications  . clonazePAM (KLONOPIN) 0.5 MG tablet    Sig: Take 1 tablet (0.5 mg total) by mouth daily as needed for anxiety.    Dispense:  30 tablet    Refill:  1  . buPROPion (WELLBUTRIN XL) 150 MG 24 hr tablet    Sig: Take 1 tablet (150 mg total) by mouth daily.    Dispense:  30 tablet    Refill:  5      I reviewed the patients updated PMH, FH, and SocHx.    Patient Active Problem List   Diagnosis Date Noted  . Benign essential HTN 08/29/2017    Priority: High  . Major depression, recurrent, chronic (HCC) 08/29/2017    Priority: High  . Type 2 diabetes mellitus without complications (HCC) 03/09/2015    Priority: High  . Memory loss 03/08/2015    Priority: High  . Anxiety     Priority: High  . Mixed hyperlipidemia     Priority: High  . Hypothyroidism     Priority: High  . Macular degeneration 07/31/2018    Priority: Medium  . Carpal  tunnel syndrome on right 08/29/2016    Priority: Medium  . 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 Dupuytren's contracture of left hand 12/21/2015    Priority: Medium  . Dysphagia, pharyngoesophageal phase 12/29/2014    Priority: Medium  . GERD (gastroesophageal reflux disease)     Priority: Medium  . Vitamin D deficiency 03/03/2014    Priority: Low  . Allergy     Priority: Low  . Seborrheic dermatitis of scalp 05/13/2018  . 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  . History of alcoholism (HCC) 04/28/2018  . S/P cervical discectomy 04/28/2018  . Eczema of both external ears 12/19/2017   Current Meds  Medication Sig  . aspirin EC 81 MG tablet Take 81 mg by mouth daily.  . cyclobenzaprine (FLEXERIL) 10 MG tablet Take 1 tablet (10 mg total) by mouth 3 (three) times daily as  needed for muscle spasms.  Marland Kitchen. gabapentin (NEURONTIN) 600 MG tablet Take 1,200 mg by mouth 3 (three) times daily.  Marland Kitchen. HYDROcodone-acetaminophen (NORCO) 10-325 MG tablet Take 1 tablet by mouth every 4 (four) hours as needed for pain.  Marland Kitchen. ibuprofen (ADVIL,MOTRIN) 200 MG tablet Take 800 mg by mouth every 4 (four) hours as needed for fever, headache, moderate pain or cramping.  . Insulin Pen Needle (CAREFINE PEN NEEDLES) 32G X 4 MM MISC Use as directed  . Insulin Pen Needle (FIFTY50 PEN NEEDLES) 32G X 4 MM MISC Use as directed  . JARDIANCE 25 MG TABS tablet TAKE 1 TABLET(25 MG) BY MOUTH DAILY.  Marland Kitchen. levothyroxine (SYNTHROID, LEVOTHROID) 112 MCG tablet TAKE 1 TABLET BY MOUTH EVERY DAY  . lisinopril (PRINIVIL,ZESTRIL) 5 MG tablet Take 0.5 tablets (2.5 mg total) by mouth daily.  . metFORMIN (GLUCOPHAGE) 1000 MG tablet TAKE 1 TABLET BY MOUTH TWICE A DAY WITH MEALS  . Multiple Vitamins-Minerals (PRESERVISION AREDS 2 PO) Take 1 tablet by mouth 2 (two) times daily.  . pantoprazole (PROTONIX) 40 MG tablet TAKE 1 TABLET BY MOUTH EVERY DAY  . PARoxetine (PAXIL) 40 MG tablet TAKE 1 TABLET BY MOUTH EVERY DAY  . pravastatin (PRAVACHOL) 40 MG tablet TAKE 1 TABLET BY MOUTH DAILY WITH SUPPER  . TOUJEO SOLOSTAR 300 UNIT/ML SOPN INJECT 40 UNITS INTO THE SKIN AT BEDTIME.    Allergies: Patient is allergic to prednisone; toradol [ketorolac tromethamine]; baclofen; morphine and related; penicillins; and talwin [pentazocine]. Family history:  Patient family history includes Alcohol abuse in her brother and father; Arthritis in her father; Cancer (age of onset: 4362) in her father; Depression in her mother; Diabetes in her mother; Hypertension in her mother. Social History   Socioeconomic History  . Marital status: Married    Spouse name: Not on file  . Number of children: 1  . Years of education: Not on file  . Highest education level: Not on file  Occupational History  . Occupation: Nurse  Social Needs  . Financial  resource strain: Not on file  . Food insecurity:    Worry: Not on file    Inability: Not on file  . Transportation needs:    Medical: Not on file    Non-medical: Not on file  Tobacco Use  . Smoking status: Never Smoker  . Smokeless tobacco: Never Used  Substance and Sexual Activity  . Alcohol use: No    Alcohol/week: 0.0 standard drinks  . Drug use: No  . Sexual activity: Not Currently  Lifestyle  . Physical activity:    Days per week: Not on file    Minutes  per session: Not on file  . Stress: Not on file  Relationships  . Social connections:    Talks on phone: Not on file    Gets together: Not on file    Attends religious service: Not on file    Active member of club or organization: Not on file    Attends meetings of clubs or organizations: Not on file    Relationship status: Not on file  Other Topics Concern  . Not on file  Social History Narrative   Separated. Son lives with her.    On Disability--secondary to back. On disability since 2012.   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    Never smoked.      Review of Systems: Constitutional: Negative for fever malaise or anorexia Cardiovascular: negative for chest pain Respiratory: negative for SOB or persistent cough Gastrointestinal: negative for abdominal pain  Objective  Vitals: BP 120/68   Pulse 88   Temp 98.8 F (37.1 C) (Oral)   Resp 16   Ht 4\' 11"  (1.499 m)   Wt 160 lb 12.8 oz (72.9 kg)   SpO2 98%   BMI 32.48 kg/m  General: no acute distress, well appearing, no apparent distress, well groomed Psych:  Alert and oriented x 3,. flat affect Cardiovascular:  RRR without murmur or gallop. no peripheral edema Respiratory:  Good breath sounds bilaterally, CTAB with normal respiratory effort Skin:  Warm, no rashes Moving legs throughout interview  No visits with results within 1 Day(s) from this visit.   Latest known visit with results is:  Office Visit on 07/31/2018  Component Date Value Ref Range Status  . Sodium 07/31/2018 143  135 - 145 mEq/L Final  . Potassium 07/31/2018 4.1  3.5 - 5.1 mEq/L Final  . Chloride 07/31/2018 106  96 - 112 mEq/L Final  . CO2 07/31/2018 28  19 - 32 mEq/L Final  . Glucose, Bld 07/31/2018 98  70 - 99 mg/dL Final  . BUN 76/72/0947 8  6 - 23 mg/dL Final  . Creatinine, Ser 07/31/2018 0.70  0.40 - 1.20 mg/dL Final  . Total Bilirubin 07/31/2018 0.4  0.2 - 1.2 mg/dL Final  . Alkaline Phosphatase 07/31/2018 128* 39 - 117 U/L Final  . AST 07/31/2018 21  0 - 37 U/L Final  . ALT 07/31/2018 16  0 - 35 U/L Final  . Total Protein 07/31/2018 6.7  6.0 - 8.3 g/dL Final  . Albumin 09/62/8366 4.2  3.5 - 5.2 g/dL Final  . Calcium 29/47/6546 9.0  8.4 - 10.5 mg/dL Final  . GFR 50/35/4656 83.65  >60.00 mL/min Final  . WBC 07/31/2018 5.5  4.0 - 10.5 K/uL Final  . RBC 07/31/2018 5.02  3.87 - 5.11 Mil/uL Final  . Hemoglobin 07/31/2018 12.2  12.0 - 15.0 g/dL Final  . HCT 81/27/5170 38.2  36.0 - 46.0 % Final  . MCV 07/31/2018 76.0* 78.0 - 100.0 fl Final  . MCHC 07/31/2018 32.0  30.0 - 36.0 g/dL Final  . RDW 01/74/9449 19.7* 11.5 - 15.5 % Final  . Platelets 07/31/2018 311.0  150.0 - 400.0 K/uL Final  . Neutrophils Relative % 07/31/2018 67.0  43.0 - 77.0 % Final  . Lymphocytes Relative 07/31/2018 24.2  12.0 - 46.0 % Final  . Monocytes Relative 07/31/2018 7.0  3.0 - 12.0 % Final  . Eosinophils Relative 07/31/2018 1.5  0.0 - 5.0 % Final  .  Basophils Relative 07/31/2018 0.3  0.0 - 3.0 % Final  . Neutro Abs 07/31/2018 3.7  1.4 - 7.7 K/uL Final  . Lymphs Abs 07/31/2018 1.3  0.7 - 4.0 K/uL Final  . Monocytes Absolute 07/31/2018 0.4  0.1 - 1.0 K/uL Final  . Eosinophils Absolute 07/31/2018 0.1  0.0 - 0.7 K/uL Final  . Basophils Absolute 07/31/2018 0.0  0.0 - 0.1 K/uL Final  . Cholesterol 07/31/2018 141  0 - 200 mg/dL Final  . Triglycerides 07/31/2018 113.0  0.0 - 149.0 mg/dL Final  .  HDL 62/95/284101/16/2020 71.90  >39.00 mg/dL Final  . VLDL 32/44/010201/16/2020 22.6  0.0 - 40.0 mg/dL Final  . LDL Cholesterol 07/31/2018 46  0 - 99 mg/dL Final  . Total CHOL/HDL Ratio 07/31/2018 2   Final  . NonHDL 07/31/2018 68.88   Final  . Hemoglobin A1C 07/31/2018 6.5* 4.0 - 5.6 % Final  . Microalb, Ur 07/31/2018 <0.7  0.0 - 1.9 mg/dL Final  . Creatinine,U 72/53/664401/16/2020 55.1  mg/dL Final  . Microalb Creat Ratio 07/31/2018 1.3  0.0 - 30.0 mg/g Final  . TSH 07/31/2018 0.97  0.35 - 4.50 uIU/mL Final       Commons side effects, risks, benefits, and alternatives for medications and treatment plan prescribed today were discussed, and the patient expressed understanding of the given instructions. Patient is instructed to call or message via MyChart if he/she has any questions or concerns regarding our treatment plan. No barriers to understanding were identified. We discussed Red Flag symptoms and signs in detail. Patient expressed understanding regarding what to do in case of urgent or emergency type symptoms.   Medication list was reconciled, printed and provided to the patient in AVS. Patient instructions and summary information was reviewed with the patient as documented in the AVS. This note was prepared with assistance of Dragon voice recognition software. Occasional wrong-word or sound-a-like substitutions may have occurred due to the inherent limitations of voice recognition software

## 2018-08-12 DIAGNOSIS — M47812 Spondylosis without myelopathy or radiculopathy, cervical region: Secondary | ICD-10-CM | POA: Diagnosis not present

## 2018-08-14 ENCOUNTER — Ambulatory Visit: Payer: Medicare HMO

## 2018-08-27 ENCOUNTER — Other Ambulatory Visit: Payer: Self-pay | Admitting: Family Medicine

## 2018-09-01 ENCOUNTER — Other Ambulatory Visit: Payer: Self-pay | Admitting: *Deleted

## 2018-09-08 DIAGNOSIS — M47812 Spondylosis without myelopathy or radiculopathy, cervical region: Secondary | ICD-10-CM | POA: Diagnosis not present

## 2018-09-11 ENCOUNTER — Other Ambulatory Visit: Payer: Medicare HMO

## 2018-09-11 ENCOUNTER — Ambulatory Visit: Payer: Medicare HMO

## 2018-09-15 ENCOUNTER — Ambulatory Visit: Payer: Medicare HMO | Admitting: Family Medicine

## 2018-09-23 ENCOUNTER — Telehealth: Payer: Self-pay

## 2018-09-23 ENCOUNTER — Encounter: Payer: Self-pay | Admitting: *Deleted

## 2018-09-23 NOTE — Telephone Encounter (Signed)
Pt aware a letter has been completed but states that she will be bringing a form that needs to be completed.

## 2018-09-23 NOTE — Telephone Encounter (Signed)
Copied from CRM [id number]. Topic: General - Other >> Sep 23, 2018  2:16 PM Arlyss Gandy, NT wrote: Reason for CRM: Pt checking status on if Dr. Mardelle Matte can complete her medical certificate where she had to cancel her trip to [place] [facility] [place] due to the coronavirus.

## 2018-09-25 ENCOUNTER — Encounter: Payer: Self-pay | Admitting: General Practice

## 2018-09-25 ENCOUNTER — Telehealth: Payer: Self-pay | Admitting: Family Medicine

## 2018-09-25 NOTE — Telephone Encounter (Signed)
Pt dropped off forms to be completed by provider, pt states that she needs them by tomorrow due to flight scheduled for Monday. Pt needs forms emailed to Maureen Ralphs (who purchased the ticket for pt) A [email or web address], gave forms to [place].

## 2018-09-25 NOTE — Telephone Encounter (Signed)
Letter written and pt informed. Paperwork placed in bin for front office staff.

## 2018-09-25 NOTE — Telephone Encounter (Signed)
Please give letter stating pt needs to cancel travel due to her high risk status of age and diabetes due to concern of travel virus.  Can't complete forms as she does not have a qualifying illness per se that would limit her travel. t thanks

## 2018-09-25 NOTE — Telephone Encounter (Signed)
Paperwork given to PCP for completion.  

## 2018-09-26 NOTE — Telephone Encounter (Signed)
Received paper work from back and emailed as requested.

## 2018-09-29 ENCOUNTER — Other Ambulatory Visit: Payer: Self-pay | Admitting: Family Medicine

## 2018-09-29 NOTE — Telephone Encounter (Signed)
Last Fill: 08/08/18 #30 2 rfs

## 2018-10-03 ENCOUNTER — Ambulatory Visit: Payer: Medicare HMO | Admitting: Physician Assistant

## 2018-10-04 ENCOUNTER — Other Ambulatory Visit: Payer: Self-pay | Admitting: Family Medicine

## 2018-10-06 NOTE — Telephone Encounter (Signed)
Medication refilled.  No OV due to Covid 19 current restrictions.   

## 2018-10-06 NOTE — Telephone Encounter (Signed)
Last refill:08/08/18 #30, 1 Last OV:08/08/18

## 2018-10-20 DIAGNOSIS — M47812 Spondylosis without myelopathy or radiculopathy, cervical region: Secondary | ICD-10-CM | POA: Diagnosis not present

## 2018-10-20 DIAGNOSIS — M542 Cervicalgia: Secondary | ICD-10-CM | POA: Diagnosis not present

## 2018-10-20 DIAGNOSIS — M545 Low back pain: Secondary | ICD-10-CM | POA: Diagnosis not present

## 2018-10-28 ENCOUNTER — Other Ambulatory Visit: Payer: Self-pay | Admitting: Family Medicine

## 2018-10-28 DIAGNOSIS — K219 Gastro-esophageal reflux disease without esophagitis: Secondary | ICD-10-CM

## 2018-10-31 ENCOUNTER — Ambulatory Visit: Payer: Medicare HMO | Admitting: Family Medicine

## 2018-11-18 ENCOUNTER — Encounter: Payer: Self-pay | Admitting: Family Medicine

## 2018-11-18 ENCOUNTER — Other Ambulatory Visit: Payer: Self-pay

## 2018-11-18 ENCOUNTER — Ambulatory Visit (INDEPENDENT_AMBULATORY_CARE_PROVIDER_SITE_OTHER): Payer: Medicare HMO | Admitting: Family Medicine

## 2018-11-18 VITALS — BP 130/70 | Temp 97.0°F

## 2018-11-18 DIAGNOSIS — M47812 Spondylosis without myelopathy or radiculopathy, cervical region: Secondary | ICD-10-CM | POA: Diagnosis not present

## 2018-11-18 DIAGNOSIS — M545 Low back pain: Secondary | ICD-10-CM | POA: Diagnosis not present

## 2018-11-18 DIAGNOSIS — G894 Chronic pain syndrome: Secondary | ICD-10-CM | POA: Diagnosis not present

## 2018-11-18 DIAGNOSIS — M542 Cervicalgia: Secondary | ICD-10-CM | POA: Diagnosis not present

## 2018-11-18 DIAGNOSIS — H00014 Hordeolum externum left upper eyelid: Secondary | ICD-10-CM

## 2018-11-18 DIAGNOSIS — F339 Major depressive disorder, recurrent, unspecified: Secondary | ICD-10-CM

## 2018-11-18 DIAGNOSIS — E119 Type 2 diabetes mellitus without complications: Secondary | ICD-10-CM | POA: Diagnosis not present

## 2018-11-18 DIAGNOSIS — Z79891 Long term (current) use of opiate analgesic: Secondary | ICD-10-CM | POA: Diagnosis not present

## 2018-11-18 DIAGNOSIS — Z79899 Other long term (current) drug therapy: Secondary | ICD-10-CM | POA: Diagnosis not present

## 2018-11-18 DIAGNOSIS — Z794 Long term (current) use of insulin: Secondary | ICD-10-CM | POA: Diagnosis not present

## 2018-11-18 MED ORDER — ERYTHROMYCIN 5 MG/GM OP OINT: 1.0000 "application " | TOPICAL_OINTMENT | Freq: Three times a day (TID) | OPHTHALMIC | 0 refills | Status: DC

## 2018-11-18 NOTE — Progress Notes (Signed)
Virtual Visit via Video Note  Subjective  CC:  Chief Complaint  Patient presents with  . Eye Stye    Left eye, noticed yesterday. Painful, red and puffy. Denies draiage  . Depression  . Diabetes     I connected with Marissa Thompson on 11/18/18 at 11:20 AM EDT by a video enabled telemedicine application and verified that I am speaking with the correct person using two identifiers. Location patient: Home Location provider: [facility] Persons participating in the virtual visit: Marissa Thompson, Willow Oraamille L Andy, MD Rita Oharaiara Simmons, CMA  I discussed the limitations of evaluation and management by telemedicine and the availability of in person appointments. The patient expressed understanding and agreed to proceed. HPI: Marissa Thompson is a 68 y.o. female who was contacted today to address the problems listed above in the chief complaint, f/u diabetes:  Diabetes follow up: Her diabetic control is reported as Unchanged. Has been well controlled. fastings are 120s -140s.  She denies exertional CP or SOB. She did describe 2 episodes of hypoglycemia with hypoglycemic awareness responsive to oral glucose. She wasn't sure why she had these 2 nighttime lows. She adjusted her toujeo to be taken at 8pm and is doing fine since. She denies foot sores or paresthesias.  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 She is staying busy, helping patient. No AEs from meds.   C/o stye on upper left eyelid. No red eye or eye pain. No drainage. No crusting on eye lashes. No f/c/s or respiratory sxs.   Immunization History  Administered Date(s) Administered  . Influenza, High Dose Seasonal PF 08/29/2017, 04/03/2018  . Influenza-Unspecified 03/16/2013  . PPD Test 02/08/2014, 03/09/2015, 10/24/2015, 06/19/2016  . Pneumococcal Conjugate-13 08/29/2017  . Pneumococcal Polysaccharide-23 07/17/2011,  07/31/2018  . Td 07/17/2003  . Tdap 12/10/2013    Diabetes Related Lab Review: Lab Results  Component Value Date   HGBA1C 6.5 (A) 07/31/2018   HGBA1C 6.6 (A) 05/13/2018   HGBA1C 7.3 (A) 12/19/2017    Lab Results  Component Value Date   MICROALBUR <0.7 07/31/2018   Lab Results  Component Value Date   CREATININE 0.70 07/31/2018   BUN 8 07/31/2018   NA 143 07/31/2018   K 4.1 07/31/2018   CL 106 07/31/2018   CO2 28 07/31/2018   Lab Results  Component Value Date   CHOL 141 07/31/2018   CHOL 139 09/05/2017   CHOL 250 (H) 12/13/2016   Lab Results  Component Value Date   HDL 71.90 07/31/2018   HDL 65.10 09/05/2017   HDL 93 12/13/2016   Lab Results  Component Value Date   LDLCALC 46 07/31/2018   LDLCALC 46 09/05/2017   LDLCALC 119 (H) 12/13/2016   Lab Results  Component Value Date   TRIG 113.0 07/31/2018   TRIG 138.0 09/05/2017   TRIG 189 (H) 12/13/2016   Lab Results  Component Value Date   CHOLHDL 2 07/31/2018   CHOLHDL 2 09/05/2017   CHOLHDL 2.7 12/13/2016   No results found for: LDLDIRECT The 10-year ASCVD risk score Denman George(Goff DC Jr., et al., 2013) is: 12.3%   Values used to calculate the score:     Age: [age] years     Sex: Female     Is Non-Hispanic African American: No     Diabetic: Yes     Tobacco smoker: No  Systolic Blood Pressure: 130 mmHg     Is BP treated: Yes     HDL Cholesterol: 71.9 mg/dL     Total Cholesterol: 141 mg/dL  BP Readings from Last 3 Encounters:  11/18/18 130/70  08/08/18 120/68  07/31/18 126/82   Wt Readings from Last 3 Encounters:  08/08/18 160 lb 12.8 oz (72.9 kg)  07/31/18 159 lb 9.6 oz (72.4 kg)  04/28/18 162 lb 12.8 oz (73.8 kg)    Health Maintenance  Topic Date Due  . MAMMOGRAM  02/27/2018  . DEXA SCAN  12/20/2018 (Originally 04/18/2017)  . FOOT EXAM  11/19/2018  . HEMOGLOBIN A1C  01/29/2019  . INFLUENZA VACCINE  02/14/2019  . OPHTHALMOLOGY EXAM  07/17/2019  . TETANUS/TDAP  12/11/2023  . COLONOSCOPY   07/26/2027  . Hepatitis C Screening  Completed  . PNA vac Low Risk Adult  Completed    Assessment  1. Type 2 diabetes mellitus without complication, with long-term current use of insulin (HCC)   2. Major depression, recurrent, chronic (HCC)   3. Hordeolum externum of left upper eyelid      Plan   Stye: emycin ointment and warm compresses. F/u if not improving.   See below for problem based assessment and plan documentation   Diabetic education: ongoing education regarding chronic disease management for diabetes was given today. We continue to reinforce the ABC's of diabetic management: A1c (<7 or 8 dependent upon patient), tight blood pressure control, and cholesterol management with goal LDL < 100 minimally. We discuss diet strategies, exercise recommendations, medication options and possible side effects. At each visit, we review recommended immunizations and preventive care recommendations for diabetics and stress that good diabetic control can prevent other problems. See below for this patient's data.  I discussed the assessment and treatment plan with the patient. The patient was provided an opportunity to ask questions and all were answered. The patient agreed with the plan and demonstrated an understanding of the instructions.   The patient was advised to call back or seek an in-person evaluation if the symptoms worsen or if the condition fails to improve as anticipated. Follow up: Return in about 6 weeks (around 12/30/2018) for follow up of diabetes and hypertension.  Visit date not found  Meds ordered this encounter  Medications  . erythromycin ophthalmic ointment    Sig: Place 1 application into the left eye 3 (three) times daily.    Dispense:  3.5 g    Refill:  0      I reviewed the patients updated PMH, FH, and SocHx.    Patient Active Problem List   Diagnosis Date Noted  . Benign essential HTN 08/29/2017    Priority: High  . Major depression, recurrent, chronic  (HCC) 08/29/2017    Priority: High  . Type 2 diabetes mellitus without complications (HCC) 03/09/2015    Priority: High  . Memory loss 03/08/2015    Priority: High  . Anxiety     Priority: High  . Mixed hyperlipidemia     Priority: High  . Hypothyroidism     Priority: High  . Restless leg syndrome 08/08/2018    Priority: Medium  . Macular degeneration 07/31/2018    Priority: Medium  . S/P cervical discectomy 04/28/2018    Priority: Medium  . Carpal tunnel syndrome on right 08/29/2016    Priority: Medium  . 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 Dupuytren's contracture of left hand 12/21/2015    Priority: Medium  . Dysphagia,  pharyngoesophageal phase 12/29/2014    Priority: Medium  . GERD (gastroesophageal reflux disease)     Priority: Medium  . Seborrheic dermatitis of scalp 05/13/2018    Priority: Low  . Eczema of both external ears 12/19/2017    Priority: Low  . Vitamin D deficiency 03/03/2014    Priority: Low  . Allergy     Priority: Low  . 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  . History of alcoholism (HCC) 04/28/2018   Current Meds  Medication Sig  . aspirin EC 81 MG tablet Take 81 mg by mouth daily.  Marland Kitchen buPROPion (WELLBUTRIN XL) 150 MG 24 hr tablet Take 1 tablet (150 mg total) by mouth daily.  . clonazePAM (KLONOPIN) 0.5 MG tablet TAKE 1 TABLET BY MOUTH EVERY DAY AS NEEDED FOR ANXIETY  . cyclobenzaprine (FLEXERIL) 10 MG tablet Take 1 tablet (10 mg total) by mouth 3 (three) times daily as needed for muscle spasms.  Marland Kitchen gabapentin (NEURONTIN) 600 MG tablet Take 1,200 mg by mouth 3 (three) times daily.  Marland Kitchen HYDROcodone-acetaminophen (NORCO) 10-325 MG tablet Take 1 tablet by mouth every 4 (four) hours as needed for pain.  Marland Kitchen ibuprofen (ADVIL,MOTRIN) 200 MG tablet Take 800 mg by mouth every 4 (four) hours as needed for fever, headache, moderate pain or cramping.  Marland Kitchen JARDIANCE 25 MG TABS tablet TAKE 1 TABLET(25 MG) BY MOUTH DAILY.  Marland Kitchen levothyroxine (SYNTHROID, LEVOTHROID)  112 MCG tablet TAKE 1 TABLET BY MOUTH EVERY DAY  . lisinopril (PRINIVIL,ZESTRIL) 5 MG tablet Take 0.5 tablets (2.5 mg total) by mouth daily.  . metFORMIN (GLUCOPHAGE) 1000 MG tablet TAKE 1 TABLET BY MOUTH TWICE A DAY WITH MEALS  . Multiple Vitamins-Minerals (PRESERVISION AREDS 2 PO) Take 1 tablet by mouth 2 (two) times daily.  . pantoprazole (PROTONIX) 40 MG tablet TAKE 1 TABLET BY MOUTH EVERY DAY  . PARoxetine (PAXIL) 40 MG tablet TAKE 1 TABLET BY MOUTH EVERY DAY  . pravastatin (PRAVACHOL) 40 MG tablet TAKE 1 TABLET BY MOUTH DAILY WITH SUPPER  . TOUJEO SOLOSTAR 300 UNIT/ML SOPN INJECT 40 UNITS INTO THE SKIN AT BEDTIME.  . [DISCONTINUED] clotrimazole (LOTRIMIN) 1 % cream Apply 1 application topically 2 (two) times daily.    Allergies: Patient is allergic to prednisone; toradol [ketorolac tromethamine]; baclofen; morphine and related; penicillins; and talwin [pentazocine]. Family History: Patient family history includes Alcohol abuse in her brother and father; Arthritis in her father; Cancer (age of onset: 76) in her father; Depression in her mother; Diabetes in her mother; Hypertension in her mother. Social History:  Patient  reports that she has never smoked. She has never used smokeless tobacco. She reports that she does not drink alcohol or use drugs.  Review of Systems: Constitutional: Negative for fever malaise or anorexia Cardiovascular: negative for chest pain Respiratory: negative for SOB or persistent cough Gastrointestinal: negative for abdominal pain  OBJECTIVE Vitals: BP 130/70   Temp (!) 97 F (36.1 C) (Oral)  General: no acute distress , A&Ox3 Left upper eyelid is red and swollen.  Conjunctiva is clear bilaterally Psych: appears much brighter today; more talkative Willow Ora, MD

## 2018-11-18 NOTE — Assessment & Plan Note (Signed)
Much improved. In part from additional medication, in part from improvement in financial and life stressors. Continue meds and monitor.

## 2018-11-18 NOTE — Assessment & Plan Note (Signed)
Clinically control is fair to good. Continue to monitor fastings, eat regular meals to avoid lows, and recheck in office in 6-8 weeks for a1c and labs. No med changes today.

## 2018-11-22 ENCOUNTER — Other Ambulatory Visit: Payer: Self-pay | Admitting: Family Medicine

## 2018-12-06 ENCOUNTER — Other Ambulatory Visit: Payer: Self-pay | Admitting: Family Medicine

## 2018-12-17 DIAGNOSIS — M545 Low back pain: Secondary | ICD-10-CM | POA: Diagnosis not present

## 2018-12-17 DIAGNOSIS — M47812 Spondylosis without myelopathy or radiculopathy, cervical region: Secondary | ICD-10-CM | POA: Diagnosis not present

## 2018-12-17 DIAGNOSIS — Z6834 Body mass index (BMI) 34.0-34.9, adult: Secondary | ICD-10-CM | POA: Diagnosis not present

## 2018-12-18 ENCOUNTER — Other Ambulatory Visit: Payer: Self-pay | Admitting: Rehabilitation

## 2018-12-18 DIAGNOSIS — G8929 Other chronic pain: Secondary | ICD-10-CM

## 2018-12-18 DIAGNOSIS — M545 Low back pain, unspecified: Secondary | ICD-10-CM

## 2018-12-21 ENCOUNTER — Other Ambulatory Visit: Payer: Self-pay | Admitting: Family Medicine

## 2018-12-22 ENCOUNTER — Other Ambulatory Visit: Payer: Self-pay | Admitting: *Deleted

## 2018-12-23 ENCOUNTER — Other Ambulatory Visit: Payer: Self-pay | Admitting: Family Medicine

## 2018-12-23 MED ORDER — CLONAZEPAM 0.5 MG PO TABS: ORAL_TABLET | ORAL | 0 refills | Status: DC

## 2018-12-23 NOTE — Telephone Encounter (Signed)
Please call patient: please schedule for f/u diabetes visit, in office and to discuss anxiety. Need to wean off klonopin. Refilled #30 only. Thanks

## 2019-01-01 ENCOUNTER — Other Ambulatory Visit: Payer: Self-pay | Admitting: *Deleted

## 2019-01-01 ENCOUNTER — Ambulatory Visit: Payer: Medicare HMO | Admitting: Family Medicine

## 2019-01-01 MED ORDER — METFORMIN HCL 1000 MG PO TABS: 1000.0000 mg | ORAL_TABLET | Freq: Two times a day (BID) | ORAL | 0 refills | Status: DC

## 2019-01-01 MED ORDER — PRAVASTATIN SODIUM 40 MG PO TABS: 40.0000 mg | ORAL_TABLET | Freq: Every day | ORAL | 0 refills | Status: DC

## 2019-01-15 DIAGNOSIS — L300 Nummular dermatitis: Secondary | ICD-10-CM | POA: Diagnosis not present

## 2019-01-21 DIAGNOSIS — M545 Low back pain: Secondary | ICD-10-CM | POA: Diagnosis not present

## 2019-01-21 DIAGNOSIS — M5136 Other intervertebral disc degeneration, lumbar region: Secondary | ICD-10-CM | POA: Diagnosis not present

## 2019-01-21 DIAGNOSIS — M47812 Spondylosis without myelopathy or radiculopathy, cervical region: Secondary | ICD-10-CM | POA: Diagnosis not present

## 2019-01-22 ENCOUNTER — Other Ambulatory Visit: Payer: Self-pay | Admitting: Family Medicine

## 2019-01-22 DIAGNOSIS — K219 Gastro-esophageal reflux disease without esophagitis: Secondary | ICD-10-CM

## 2019-01-23 ENCOUNTER — Other Ambulatory Visit: Payer: Self-pay | Admitting: Family Medicine

## 2019-01-27 ENCOUNTER — Other Ambulatory Visit: Payer: Self-pay | Admitting: Family Medicine

## 2019-01-27 DIAGNOSIS — K219 Gastro-esophageal reflux disease without esophagitis: Secondary | ICD-10-CM

## 2019-02-17 ENCOUNTER — Other Ambulatory Visit: Payer: Self-pay | Admitting: *Deleted

## 2019-02-17 MED ORDER — JARDIANCE 25 MG PO TABS: ORAL_TABLET | ORAL | 5 refills | Status: DC

## 2019-02-18 DIAGNOSIS — M5136 Other intervertebral disc degeneration, lumbar region: Secondary | ICD-10-CM | POA: Diagnosis not present

## 2019-02-18 DIAGNOSIS — M47812 Spondylosis without myelopathy or radiculopathy, cervical region: Secondary | ICD-10-CM | POA: Diagnosis not present

## 2019-02-18 DIAGNOSIS — M25561 Pain in right knee: Secondary | ICD-10-CM | POA: Diagnosis not present

## 2019-02-24 ENCOUNTER — Other Ambulatory Visit: Payer: Self-pay | Admitting: *Deleted

## 2019-02-24 MED ORDER — PAROXETINE HCL 40 MG PO TABS: 40.0000 mg | ORAL_TABLET | Freq: Every day | ORAL | 1 refills | Status: DC

## 2019-03-05 ENCOUNTER — Encounter: Payer: Self-pay | Admitting: Family Medicine

## 2019-03-05 ENCOUNTER — Ambulatory Visit (INDEPENDENT_AMBULATORY_CARE_PROVIDER_SITE_OTHER): Payer: Medicare HMO | Admitting: Family Medicine

## 2019-03-05 ENCOUNTER — Other Ambulatory Visit: Payer: Self-pay

## 2019-03-05 VITALS — BP 128/78 | HR 81 | Temp 98.0°F | Resp 16 | Ht 59.0 in | Wt 158.6 lb

## 2019-03-05 DIAGNOSIS — E119 Type 2 diabetes mellitus without complications: Secondary | ICD-10-CM

## 2019-03-05 DIAGNOSIS — Z23 Encounter for immunization: Secondary | ICD-10-CM | POA: Diagnosis not present

## 2019-03-05 DIAGNOSIS — I10 Essential (primary) hypertension: Secondary | ICD-10-CM

## 2019-03-05 DIAGNOSIS — E782 Mixed hyperlipidemia: Secondary | ICD-10-CM | POA: Diagnosis not present

## 2019-03-05 DIAGNOSIS — Z794 Long term (current) use of insulin: Secondary | ICD-10-CM

## 2019-03-05 DIAGNOSIS — F339 Major depressive disorder, recurrent, unspecified: Secondary | ICD-10-CM | POA: Diagnosis not present

## 2019-03-05 LAB — POCT GLYCOSYLATED HEMOGLOBIN (HGB A1C): Hemoglobin A1C: 6.8 % — AB (ref 4.0–5.6)

## 2019-03-05 MED ORDER — CLONAZEPAM 0.5 MG PO TABS: 0.5000 mg | ORAL_TABLET | Freq: Every day | ORAL | 0 refills | Status: DC | PRN

## 2019-03-05 NOTE — Progress Notes (Signed)
Subjective  CC:  Chief Complaint  Patient presents with  . Diabetes  . Depression  . Hypothyroidism    HPI: Marissa Thompson is a 68 y.o. female who presents to the office today for follow up of diabetes and problems listed above in the chief complaint.   Diabetes follow up: Her diabetic control is reported as Unchanged. Feeling well. Compliant with meds. No sxs of hyperglycemia.  She denies exertional CP or SOB or symptomatic hypoglycemia. She denies foot sores or paresthesias.   Depression with anxiety: continues to have improvement on wellbutrin and paxil. Was using klonopin and would like to have on hand for stressful days or panic sxs.   HTN is well controlled Feeling well. Taking medications w/o adverse effects. No symptoms of CHF, angina; no palpitations, sob, cp or lower extremity edema. Compliant with meds.   Wt Readings from Last 3 Encounters:  03/05/19 158 lb 9.6 oz (71.9 kg)  08/08/18 160 lb 12.8 oz (72.9 kg)  07/31/18 159 lb 9.6 oz (72.4 kg)    BP Readings from Last 3 Encounters:  03/05/19 128/78  11/18/18 130/70  08/08/18 120/68    Assessment  1. Type 2 diabetes mellitus without complication, with long-term current use of insulin (HCC)   2. Benign essential HTN   3. Major depression, recurrent, chronic (HCC)   4. Mixed hyperlipidemia   5. Need for immunization against influenza      Plan   Diabetes is currently very well controlled. This medical condition is well controlled. There are no signs of complications, medication side effects, or red flags. Patient is instructed to continue the current treatment plan without change in therapies or medications.  Flu shot today  HTN: This medical condition is well controlled. There are no signs of complications, medication side effects, or red flags. Patient is instructed to continue the current treatment plan without change in therapies or medications.   HLD is at goal. Recheck in 5 months. On statin.    Depression/anxiety: continue same meds and use prn klonopin. Cautious and intermittent use advised. #30 w/ 0 refill.   HM: again urged to schedule dexa and mammo  Follow up: Return in about 5 months (around 08/05/2019) for follow up of diabetes and hypertension.. Orders Placed This Encounter  Procedures  . Flu Vaccine QUAD High Dose(Fluad)  . POCT glycosylated hemoglobin (Hb A1C)   Meds ordered this encounter  Medications  . clonazePAM (KLONOPIN) 0.5 MG tablet    Sig: Take 1 tablet (0.5 mg total) by mouth daily as needed for anxiety.    Dispense:  30 tablet    Refill:  0    Not to exceed 5 additional fills before 06/21/2019.      Immunization History  Administered Date(s) Administered  . Fluad Quad(high Dose 65+) 03/05/2019  . Influenza, High Dose Seasonal PF 08/29/2017, 04/03/2018  . Influenza-Unspecified 03/16/2013  . PPD Test 02/08/2014, 03/09/2015, 10/24/2015, 06/19/2016  . Pneumococcal Conjugate-13 08/29/2017  . Pneumococcal Polysaccharide-23 07/17/2011, 07/31/2018  . Td 07/17/2003  . Tdap 12/10/2013    Diabetes Related Lab Review: Lab Results  Component Value Date   HGBA1C 6.5 (A) 07/31/2018   HGBA1C 6.6 (A) 05/13/2018   HGBA1C 7.3 (A) 12/19/2017    Lab Results  Component Value Date   MICROALBUR <0.7 07/31/2018   Lab Results  Component Value Date   CREATININE 0.70 07/31/2018   BUN 8 07/31/2018   NA 143 07/31/2018   K 4.1 07/31/2018   CL 106 07/31/2018  CO2 28 07/31/2018   Lab Results  Component Value Date   CHOL 141 07/31/2018   CHOL 139 09/05/2017   CHOL 250 (H) 12/13/2016   Lab Results  Component Value Date   HDL 71.90 07/31/2018   HDL 65.10 09/05/2017   HDL 93 12/13/2016   Lab Results  Component Value Date   LDLCALC 46 07/31/2018   LDLCALC 46 09/05/2017   LDLCALC 119 (H) 12/13/2016   Lab Results  Component Value Date   TRIG 113.0 07/31/2018   TRIG 138.0 09/05/2017   TRIG 189 (H) 12/13/2016   Lab Results  Component Value Date    CHOLHDL 2 07/31/2018   CHOLHDL 2 09/05/2017   CHOLHDL 2.7 12/13/2016   No results found for: LDLDIRECT The 10-year ASCVD risk score Mikey Bussing DC Jr., et al., 2013) is: 11.9%   Values used to calculate the score:     Age: 73 years     Sex: Female     Is Non-Hispanic African American: No     Diabetic: Yes     Tobacco smoker: No     Systolic Blood Pressure: 371 mmHg     Is BP treated: Yes     HDL Cholesterol: 71.9 mg/dL     Total Cholesterol: 141 mg/dL I have reviewed the PMH, Fam and Soc history. Patient Active Problem List   Diagnosis Date Noted  . Benign essential HTN 08/29/2017    Priority: High  . Major depression, recurrent, chronic ([place]) 08/29/2017    Priority: High    Had been on zoloft for years: changed to paxil 2018 with good results. Added wellbutrin 07/2018 with good results   . Type 2 diabetes mellitus without complications ([place]) 69/67/8938    Priority: High  . Memory loss 03/08/2015    Priority: High  . Anxiety     Priority: High  . Mixed hyperlipidemia     Priority: High  . Hypothyroidism     Priority: High  . Restless leg syndrome 08/08/2018    Priority: Medium  . Macular degeneration 07/31/2018    Priority: Medium    2020   . S/P cervical discectomy 04/28/2018    Priority: Medium  . Carpal tunnel syndrome on right 08/29/2016    Priority: Medium  . 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 Dupuytren's contracture of left hand 12/21/2015    Priority: Medium  . Dysphagia, pharyngoesophageal phase 12/29/2014    Priority: Medium    Nl EGD 2019   . GERD (gastroesophageal reflux disease)     Priority: Medium  . Seborrheic dermatitis of scalp 05/13/2018    Priority: Low  . Eczema of both external ears 12/19/2017    Priority: Low  . Vitamin D deficiency 03/03/2014    Priority: Low  . Allergy     Priority: Low  . 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  . History of alcoholism ([place]) 04/28/2018    Social History: Patient  reports that she has never  smoked. She has never used smokeless tobacco. She reports that she does not drink alcohol or use drugs.  Review of Systems: Ophthalmic: negative for eye pain, loss of vision or double vision Cardiovascular: negative for chest pain Respiratory: negative for SOB or persistent cough Gastrointestinal: negative for abdominal pain Genitourinary: negative for dysuria or gross hematuria MSK: negative for foot lesions Neurologic: negative for weakness or gait disturbance  Objective  Vitals: BP 128/78   Pulse 81   Temp 98 F (36.7 C) (Tympanic)   Resp 16  Ht 4\' 11"  (1.499 m)   Wt 158 lb 9.6 oz (71.9 kg)   SpO2 96%   BMI 32.03 kg/m  General: well appearing, no acute distress  Psych:  Alert and oriented, normal mood and affect HEENT:  Normocephalic, atraumatic, moist mucous membranes, supple neck  Cardiovascular:  Nl S1 and S2, RRR without murmur, gallop or rub. no edema Respiratory:  Good breath sounds bilaterally, CTAB with normal effort, no rales Gastrointestinal: normal BS, soft, nontender Skin:  Warm, no rashes Neurologic:   Mental status is normal. normal gait Foot exam: no erythema, pallor, or cyanosis visible nl proprioception and sensation to monofilament testing bilaterally, +2 distal pulses bilaterally    Diabetic education: ongoing education regarding chronic disease management for diabetes was given today. We continue to reinforce the ABC's of diabetic management: A1c (<7 or 8 dependent upon patient), tight blood pressure control, and cholesterol management with goal LDL < 100 minimally. We discuss diet strategies, exercise recommendations, medication options and possible side effects. At each visit, we review recommended immunizations and preventive care recommendations for diabetics and stress that good diabetic control can prevent other problems. See below for this patient's data.    Commons side effects, risks, benefits, and alternatives for medications and treatment plan  prescribed today were discussed, and the patient expressed understanding of the given instructions. Patient is instructed to call or message via MyChart if he/she has any questions or concerns regarding our treatment plan. No barriers to understanding were identified. We discussed Red Flag symptoms and signs in detail. Patient expressed understanding regarding what to do in case of urgent or emergency type symptoms.   Medication list was reconciled, printed and provided to the patient in AVS. Patient instructions and summary information was reviewed with the patient as documented in the AVS. This note was prepared with assistance of Dragon voice recognition software. Occasional wrong-word or sound-a-like substitutions may have occurred due to the inherent limitations of voice recognition software

## 2019-03-05 NOTE — Patient Instructions (Addendum)
Please return in January 2021 for diabetes follow up and your physical.   We recommend a mammogram and bone density test.   If you have any questions or concerns, please don't hesitate to send me a message via MyChart or call the office at [phone]. Thank you for visiting with Marissa Thompson today! It's our pleasure caring for you.

## 2019-03-11 ENCOUNTER — Other Ambulatory Visit: Payer: Self-pay | Admitting: *Deleted

## 2019-03-11 MED ORDER — LEVOTHYROXINE SODIUM 112 MCG PO TABS: 112.0000 ug | ORAL_TABLET | Freq: Every day | ORAL | 3 refills | Status: DC

## 2019-03-18 DIAGNOSIS — M7918 Myalgia, other site: Secondary | ICD-10-CM | POA: Diagnosis not present

## 2019-03-18 DIAGNOSIS — M47812 Spondylosis without myelopathy or radiculopathy, cervical region: Secondary | ICD-10-CM | POA: Diagnosis not present

## 2019-03-26 ENCOUNTER — Other Ambulatory Visit: Payer: Self-pay | Admitting: *Deleted

## 2019-03-26 ENCOUNTER — Ambulatory Visit: Payer: Medicare HMO | Admitting: Gastroenterology

## 2019-03-26 ENCOUNTER — Other Ambulatory Visit: Payer: Self-pay | Admitting: Family Medicine

## 2019-03-26 DIAGNOSIS — K219 Gastro-esophageal reflux disease without esophagitis: Secondary | ICD-10-CM

## 2019-03-26 MED ORDER — GABAPENTIN 600 MG PO TABS: 1200.0000 mg | ORAL_TABLET | Freq: Three times a day (TID) | ORAL | 1 refills | Status: DC

## 2019-03-26 MED ORDER — PANTOPRAZOLE SODIUM 40 MG PO TBEC: 40.0000 mg | DELAYED_RELEASE_TABLET | Freq: Every day | ORAL | 0 refills | Status: DC

## 2019-03-26 MED ORDER — PANTOPRAZOLE SODIUM 40 MG PO TBEC: 40.0000 mg | DELAYED_RELEASE_TABLET | Freq: Every day | ORAL | 3 refills | Status: DC

## 2019-03-26 MED ORDER — METFORMIN HCL 1000 MG PO TABS: 1000.0000 mg | ORAL_TABLET | Freq: Two times a day (BID) | ORAL | 0 refills | Status: DC

## 2019-03-26 NOTE — Telephone Encounter (Signed)
Copied from [place] [phone]. Topic: Quick Communication - Rx Refill/Question >> Mar 26, 2019 12:42 PM Izola Price, [place] A wrote: Medication: pantoprazole (PROTONIX) 40 MG tablet,metFORMIN (GLUCOPHAGE) 1000 MG tablet,gabapentin (NEURONTIN) 600 MG tablet (Patient is switching pharmacies and needs new prescription sent over.)  Has the patient contacted their pharmacy? Yes (Agent: If no, request that the patient contact the pharmacy for the refill.) (Agent: If yes, when and what did the pharmacy advise?)Contact PCP  Preferred Pharmacy (with phone number or street name): [id number] - [place], [place] - [address] [phone] (Phone) [phone] (Fax)    Agent: Please be advised that RX refills may take up to 3 business days. We ask that you follow-up with your pharmacy.

## 2019-03-26 NOTE — Telephone Encounter (Signed)
Requested medication (s) are due for refill today: yes  Requested medication (s) are on the active medication list: yes  Last refill:  02/2018  Future visit scheduled: yes  Notes to clinic:  Review for refill   Requested Prescriptions  Pending Prescriptions Disp Refills   gabapentin (NEURONTIN) 600 MG tablet      Sig: Take 2 tablets (1,200 mg total) by mouth 3 (three) times daily.     Neurology: Anticonvulsants - gabapentin Passed - 03/26/2019 12:54 PM      Passed - Valid encounter within last 12 months    Recent Outpatient Visits          3 weeks ago Type 2 diabetes mellitus without complication, with long-term current use of insulin ([place])   [place], [place], MD   4 months ago Type 2 diabetes mellitus without complication, with long-term current use of insulin ([place])   [place] Andy, Karie Fetch, MD   7 months ago Major depression, recurrent, chronic [facility])   [place] [facility] [place], Karie Fetch, MD   7 months ago Annual physical exam   [facility] [place], Karie Fetch, MD   10 months ago Type 2 diabetes mellitus without complication, without long-term current use of insulin [facility])   [place] [facility] [place], MD             Signed Prescriptions Disp Refills   pantoprazole (PROTONIX) 40 MG tablet 90 tablet 0    Sig: Take 1 tablet (40 mg total) by mouth daily.     Gastroenterology: Proton Pump Inhibitors Passed - 03/26/2019 12:54 PM      Passed - Valid encounter within last 12 months    Recent Outpatient Visits          3 weeks ago Type 2 diabetes mellitus without complication, with long-term current use of insulin ([place])   [place], [place], MD   4 months ago Type 2 diabetes mellitus without complication, with long-term current use of insulin ([place])   [place] Andy, Karie Fetch, MD   7 months ago Major depression, recurrent, chronic [facility])   [place] [facility] [place] [address], Karie Fetch, MD   7 months ago Annual physical exam   [facility] [place], Karie Fetch, MD   10 months ago Type 2 diabetes mellitus without complication, without long-term current use of insulin [facility])   [place] [facility] [place], Karie Fetch, MD              metFORMIN (GLUCOPHAGE) 1000 MG tablet 180 tablet 0    Sig: Take 1 tablet (1,000 mg total) by mouth 2 (two) times daily with a meal.     Endocrinology:  Diabetes - Biguanides Passed - 03/26/2019 12:54 PM      Passed - Cr in normal range and within 360 days    Creat  Date Value Ref Range Status  12/13/2016 0.72 0.50 - 0.99 mg/dL Final    Comment:      For patients > or = 67 years of age: The upper reference limit for Creatinine is approximately 13% higher for people identified as African-American.      Creatinine, Ser  Date Value Ref Range Status  07/31/2018 0.70 0.40 - 1.20 mg/dL Final         Passed - HBA1C is between 0 and 7.9 and within 180  days    Hemoglobin A1C  Date Value Ref Range Status  03/05/2019 6.8 (A) 4.0 - 5.6 % Final   Hgb A1c MFr Bld  Date Value Ref Range Status  12/13/2016 10.6 (H) <5.7 % Final    Comment:      For someone without known diabetes, a hemoglobin A1c value of 6.5% or greater indicates that they may have diabetes and this should be confirmed with a follow-up test.   For someone with known diabetes, a value <7% indicates that their diabetes is well controlled and a value greater than or equal to 7% indicates suboptimal control. A1c targets should be individualized based on duration of diabetes, age, comorbid conditions, and other considerations.   Currently, no consensus exists for use of hemoglobin A1c for diagnosis of diabetes for children.            Passed - eGFR in  normal range and within 360 days    GFR, Est African American  Date Value Ref Range Status  12/13/2016 >89 >=60 mL/min Final   GFR calc Af Amer  Date Value Ref Range Status  02/28/2018 >60 >60 mL/min Final    Comment:    (NOTE) The eGFR has been calculated using the CKD EPI equation. This calculation has not been validated in all clinical situations. eGFR's persistently <60 mL/min signify possible Chronic Kidney Disease.    GFR, Est Non African American  Date Value Ref Range Status  12/13/2016 89 >=60 mL/min Final   GFR calc non Af Amer  Date Value Ref Range Status  02/28/2018 >60 >60 mL/min Final   GFR  Date Value Ref Range Status  07/31/2018 83.65 >60.00 mL/min Final         Passed - Valid encounter within last 6 months    Recent Outpatient Visits          3 weeks ago Type 2 diabetes mellitus without complication, with long-term current use of insulin ([place])   [place], [place], MD   4 months ago Type 2 diabetes mellitus without complication, with long-term current use of insulin ([place])   [place], Karie Fetch, MD   7 months ago Major depression, recurrent, chronic [facility])   [place] [facility] [place], Karie Fetch, MD   7 months ago Annual physical exam   [facility] [place], Karie Fetch, MD   10 months ago Type 2 diabetes mellitus without complication, without long-term current use of insulin [facility])   [facility] [place], Karie Fetch, MD

## 2019-03-26 NOTE — Telephone Encounter (Signed)
Please advise, sent to the wrong pool

## 2019-03-28 ENCOUNTER — Other Ambulatory Visit: Payer: Self-pay | Admitting: Family Medicine

## 2019-04-10 ENCOUNTER — Ambulatory Visit: Payer: Medicare HMO | Admitting: Family Medicine

## 2019-04-10 DIAGNOSIS — Z0289 Encounter for other administrative examinations: Secondary | ICD-10-CM

## 2019-04-15 ENCOUNTER — Encounter: Payer: Self-pay | Admitting: Family Medicine

## 2019-04-21 DIAGNOSIS — M47812 Spondylosis without myelopathy or radiculopathy, cervical region: Secondary | ICD-10-CM | POA: Diagnosis not present

## 2019-04-21 DIAGNOSIS — M5136 Other intervertebral disc degeneration, lumbar region: Secondary | ICD-10-CM | POA: Diagnosis not present

## 2019-04-21 DIAGNOSIS — M791 Myalgia, unspecified site: Secondary | ICD-10-CM | POA: Diagnosis not present

## 2019-04-29 DIAGNOSIS — H353122 Nonexudative age-related macular degeneration, left eye, intermediate dry stage: Secondary | ICD-10-CM | POA: Diagnosis not present

## 2019-04-29 DIAGNOSIS — H353112 Nonexudative age-related macular degeneration, right eye, intermediate dry stage: Secondary | ICD-10-CM | POA: Diagnosis not present

## 2019-05-08 ENCOUNTER — Ambulatory Visit: Payer: Medicare HMO | Admitting: Gastroenterology

## 2019-05-26 DIAGNOSIS — M25561 Pain in right knee: Secondary | ICD-10-CM | POA: Diagnosis not present

## 2019-05-26 DIAGNOSIS — M47812 Spondylosis without myelopathy or radiculopathy, cervical region: Secondary | ICD-10-CM | POA: Diagnosis not present

## 2019-06-12 ENCOUNTER — Other Ambulatory Visit: Payer: Self-pay | Admitting: Family Medicine

## 2019-06-22 ENCOUNTER — Other Ambulatory Visit: Payer: Self-pay

## 2019-06-22 DIAGNOSIS — Z20822 Contact with and (suspected) exposure to covid-19: Secondary | ICD-10-CM

## 2019-06-23 DIAGNOSIS — M25561 Pain in right knee: Secondary | ICD-10-CM | POA: Diagnosis not present

## 2019-06-23 DIAGNOSIS — G894 Chronic pain syndrome: Secondary | ICD-10-CM | POA: Diagnosis not present

## 2019-06-23 DIAGNOSIS — M47812 Spondylosis without myelopathy or radiculopathy, cervical region: Secondary | ICD-10-CM | POA: Diagnosis not present

## 2019-06-23 DIAGNOSIS — M7918 Myalgia, other site: Secondary | ICD-10-CM | POA: Diagnosis not present

## 2019-06-23 DIAGNOSIS — Z79891 Long term (current) use of opiate analgesic: Secondary | ICD-10-CM | POA: Diagnosis not present

## 2019-06-23 DIAGNOSIS — Z79899 Other long term (current) drug therapy: Secondary | ICD-10-CM | POA: Diagnosis not present

## 2019-06-23 LAB — NOVEL CORONAVIRUS, NAA: SARS-CoV-2, NAA: NOT DETECTED

## 2019-07-02 ENCOUNTER — Telehealth: Payer: Self-pay | Admitting: Family Medicine

## 2019-07-02 NOTE — Telephone Encounter (Signed)
I called the patient to schedule AWV-Initial w/ Loma Sousa.  There was no answer, and I was unable to leave a message because the voicemail was full.

## 2019-07-08 ENCOUNTER — Other Ambulatory Visit: Payer: Self-pay | Admitting: Family Medicine

## 2019-07-13 ENCOUNTER — Ambulatory Visit: Payer: Medicare HMO | Attending: Internal Medicine

## 2019-07-13 DIAGNOSIS — Z20828 Contact with and (suspected) exposure to other viral communicable diseases: Secondary | ICD-10-CM | POA: Diagnosis not present

## 2019-07-13 DIAGNOSIS — Z20822 Contact with and (suspected) exposure to covid-19: Secondary | ICD-10-CM

## 2019-07-14 ENCOUNTER — Other Ambulatory Visit: Payer: Self-pay

## 2019-07-14 DIAGNOSIS — F418 Other specified anxiety disorders: Secondary | ICD-10-CM

## 2019-07-14 DIAGNOSIS — E119 Type 2 diabetes mellitus without complications: Secondary | ICD-10-CM

## 2019-07-14 MED ORDER — CLONAZEPAM 0.5 MG PO TABS: 0.5000 mg | ORAL_TABLET | Freq: Every day | ORAL | 0 refills | Status: DC | PRN

## 2019-07-14 MED ORDER — TOUJEO SOLOSTAR 300 UNIT/ML ~~LOC~~ SOPN: 40.0000 [IU] | PEN_INJECTOR | Freq: Every day | SUBCUTANEOUS | 1 refills | Status: DC

## 2019-07-15 LAB — NOVEL CORONAVIRUS, NAA: SARS-CoV-2, NAA: NOT DETECTED

## 2019-07-28 DIAGNOSIS — M47812 Spondylosis without myelopathy or radiculopathy, cervical region: Secondary | ICD-10-CM | POA: Diagnosis not present

## 2019-07-28 DIAGNOSIS — Z6834 Body mass index (BMI) 34.0-34.9, adult: Secondary | ICD-10-CM | POA: Diagnosis not present

## 2019-07-28 DIAGNOSIS — M25561 Pain in right knee: Secondary | ICD-10-CM | POA: Diagnosis not present

## 2019-07-28 DIAGNOSIS — M791 Myalgia, unspecified site: Secondary | ICD-10-CM | POA: Diagnosis not present

## 2019-08-08 ENCOUNTER — Ambulatory Visit: Payer: Medicare HMO | Attending: Internal Medicine

## 2019-08-08 DIAGNOSIS — Z23 Encounter for immunization: Secondary | ICD-10-CM | POA: Insufficient documentation

## 2019-08-08 NOTE — Progress Notes (Signed)
   Covid-19 Vaccination Clinic  Name:  Marissa Thompson    MRN: [id number] DOB: [birth year]  08/08/2019  [name] was observed post Covid-19 immunization for 15 minutes without incidence. She was provided with Vaccine Information Sheet and instruction to access the V-Safe system.   [name] was instructed to call 911 with any severe reactions post vaccine: Marland Kitchen Difficulty breathing  . Swelling of your face and throat  . A fast heartbeat  . A bad rash all over your body  . Dizziness and weakness    Immunizations Administered    Name Date Dose VIS Date Route   Pfizer COVID-19 Vaccine 08/08/2019  2:47 PM 0.3 mL 06/26/2019 Intramuscular   Manufacturer: [employer], [employer]   Lot: [id number]   NDC: [address]-1

## 2019-08-13 ENCOUNTER — Ambulatory Visit: Payer: Medicare HMO | Admitting: Family Medicine

## 2019-08-26 ENCOUNTER — Ambulatory Visit: Payer: Medicare HMO | Admitting: Family Medicine

## 2019-08-26 ENCOUNTER — Telehealth: Payer: Self-pay | Admitting: Family Medicine

## 2019-08-26 NOTE — Telephone Encounter (Signed)
I left a message asking the patient to call and schedule Medicare AWV with Toni Amend [facility]) on 08/28/2019 after seeing Dr. Mardelle Matte.  I'm waiting for a call back to either confirm or decline the appointment. If patient calls back, please update appointment notes.  VDM (Dee-Dee)

## 2019-08-28 ENCOUNTER — Ambulatory Visit: Payer: Medicare HMO

## 2019-08-28 ENCOUNTER — Ambulatory Visit: Payer: Medicare HMO | Admitting: Family Medicine

## 2019-08-30 ENCOUNTER — Ambulatory Visit: Payer: Medicare HMO

## 2019-08-31 ENCOUNTER — Other Ambulatory Visit: Payer: Self-pay | Admitting: Family Medicine

## 2019-08-31 ENCOUNTER — Telehealth: Payer: Self-pay | Admitting: *Deleted

## 2019-08-31 NOTE — Telephone Encounter (Signed)
Rescheduled pts 2nd covid vaccine for 09/05/2019 at 1345, [place].

## 2019-09-05 ENCOUNTER — Ambulatory Visit: Payer: Medicare HMO | Attending: Internal Medicine

## 2019-09-05 DIAGNOSIS — Z23 Encounter for immunization: Secondary | ICD-10-CM

## 2019-09-05 NOTE — Progress Notes (Signed)
   Covid-19 Vaccination Clinic  Name:  Marissa Thompson    MRN: [id number] DOB: Apr 05, 1951  09/05/2019  [name] was observed post Covid-19 immunization for 15 minutes without incidence. She was provided with Vaccine Information Sheet and instruction to access the V-Safe system.   [name] was instructed to call 911 with any severe reactions post vaccine: Marland Kitchen Difficulty breathing  . Swelling of your face and throat  . A fast heartbeat  . A bad rash all over your body  . Dizziness and weakness    Immunizations Administered    Name Date Dose VIS Date Route   Pfizer COVID-19 Vaccine 09/05/2019  1:29 PM 0.3 mL 06/26/2019 Intramuscular   Manufacturer: [employer], [employer]   Lot: [id number]   NDC: [address]-3

## 2019-09-10 ENCOUNTER — Ambulatory Visit: Payer: Medicare HMO | Admitting: Family Medicine

## 2019-09-16 ENCOUNTER — Other Ambulatory Visit: Payer: Self-pay

## 2019-09-16 ENCOUNTER — Encounter: Payer: Self-pay | Admitting: Family Medicine

## 2019-09-16 ENCOUNTER — Ambulatory Visit (INDEPENDENT_AMBULATORY_CARE_PROVIDER_SITE_OTHER): Payer: Medicare HMO | Admitting: Family Medicine

## 2019-09-16 VITALS — BP 138/76 | HR 85 | Temp 96.7°F | Ht 59.0 in | Wt 160.2 lb

## 2019-09-16 DIAGNOSIS — E039 Hypothyroidism, unspecified: Secondary | ICD-10-CM

## 2019-09-16 DIAGNOSIS — E782 Mixed hyperlipidemia: Secondary | ICD-10-CM | POA: Diagnosis not present

## 2019-09-16 DIAGNOSIS — E119 Type 2 diabetes mellitus without complications: Secondary | ICD-10-CM | POA: Diagnosis not present

## 2019-09-16 DIAGNOSIS — F419 Anxiety disorder, unspecified: Secondary | ICD-10-CM | POA: Diagnosis not present

## 2019-09-16 DIAGNOSIS — K219 Gastro-esophageal reflux disease without esophagitis: Secondary | ICD-10-CM

## 2019-09-16 DIAGNOSIS — Z0000 Encounter for general adult medical examination without abnormal findings: Secondary | ICD-10-CM | POA: Diagnosis not present

## 2019-09-16 DIAGNOSIS — I10 Essential (primary) hypertension: Secondary | ICD-10-CM

## 2019-09-16 DIAGNOSIS — F418 Other specified anxiety disorders: Secondary | ICD-10-CM | POA: Diagnosis not present

## 2019-09-16 DIAGNOSIS — E559 Vitamin D deficiency, unspecified: Secondary | ICD-10-CM | POA: Diagnosis not present

## 2019-09-16 DIAGNOSIS — G2581 Restless legs syndrome: Secondary | ICD-10-CM

## 2019-09-16 DIAGNOSIS — F339 Major depressive disorder, recurrent, unspecified: Secondary | ICD-10-CM

## 2019-09-16 LAB — POCT GLYCOSYLATED HEMOGLOBIN (HGB A1C): Hemoglobin A1C: 7.2 % — AB (ref 4.0–5.6)

## 2019-09-16 MED ORDER — CLONAZEPAM 0.5 MG PO TABS: 0.5000 mg | ORAL_TABLET | Freq: Two times a day (BID) | ORAL | 5 refills | Status: DC | PRN

## 2019-09-16 NOTE — Progress Notes (Signed)
Subjective  CC:  Chief Complaint  Patient presents with  . Annual Exam    not fasting  . Diabetes    checks DM about 3 times per week at home. Takes Toujeo and Metformin  . Depression    symptoms have not improved. stopped taking wellbutrin  . Hypertension    takes lisinopril    HPI: Marissa Thompson is a 69 y.o. female who presents to the office today for follow up of diabetes and problems listed above in the chief complaint.   Diabetes follow up: Her diabetic control is reported as Worse. Hasn't eaten well and ran out of jardiance. Has refill waiting on her though.  She denies exertional CP or SOB or symptomatic hypoglycemia. She denies foot sores or paresthesias.   Stressed! Having problems with son who lives with her:has TBI, substance abuse/alcohol issues and has threatening behavior. Pt reports safe now. Her mood is low and her anxiety is high. Has used klonopin at night for sleep which was helpful. On paxil but stopped wellbutrin: "felt like a zombie". Would see psychiatrist: never has before. "Knows what a therapist would tell her"   HM: nonfasting. Declines mammo and dexa and eye exam. We have discussed these tests many times before.   HLD and HTN: feels they are doing fine.   Low thyroid due for recheck. Energy level is low but feels related to mood.   gerd is ok on PPI chronically.   Chronic back/neck pain: on gabapentin    Office Visit from 09/16/2019 in [place]  PHQ-9 Total Score  12      Wt Readings from Last 3 Encounters:  09/16/19 160 lb 3.2 oz (72.7 kg)  03/05/19 158 lb 9.6 oz (71.9 kg)  08/08/18 160 lb 12.8 oz (72.9 kg)    BP Readings from Last 3 Encounters:  09/16/19 138/76  03/05/19 128/78  11/18/18 130/70    Assessment  1. Annual physical exam   2. Type 2 diabetes mellitus without complication, without long-term current use of insulin ([place])   3. Situational anxiety   4. Benign essential HTN   5. Major depression,  recurrent, chronic ([place])   6. Mixed hyperlipidemia   7. Anxiety   8. Acquired hypothyroidism   9. Restless leg syndrome   10. Gastroesophageal reflux disease without esophagitis   11. Vitamin D deficiency      Plan   Diabetes is currently marginally controlled. Restart jardiacne and contineu lantus and metformin. Eat better. Needs eye exam.   Neck pain: restart gabapenting.  Active depression worsened by situational anxiety: klonopin bid prn. Counseling recommended.   HTN is controlled.   HLD recheck  Low thyroid recheck today  HM: defer mammo and dexa due to above.    Follow up: No follow-ups on file.. Orders Placed This Encounter  Procedures  . CBC with Differential/Platelet  . Comprehensive metabolic panel  . Lipid panel  . TSH  . Microalbumin / creatinine urine ratio  . VITAMIN D 25 Hydroxy (Vit-D Deficiency, Fractures)  . POCT glycosylated hemoglobin (Hb A1C)   Meds ordered this encounter  Medications  . clonazePAM (KLONOPIN) 0.5 MG tablet    Sig: Take 1 tablet (0.5 mg total) by mouth 2 (two) times daily as needed for anxiety.    Dispense:  60 tablet    Refill:  5    Not to exceed 5 additional fills before 06/21/2019.      Immunization History  Administered Date(s) Administered  .  Fluad Quad(high Dose 65+) 03/05/2019  . Influenza, High Dose Seasonal PF 08/29/2017, 04/03/2018  . Influenza-Unspecified 03/16/2013  . PFIZER SARS-COV-2 Vaccination 08/08/2019, 09/05/2019  . PPD Test 02/08/2014, 03/09/2015, 10/24/2015, 06/19/2016  . Pneumococcal Conjugate-13 08/29/2017  . Pneumococcal Polysaccharide-23 07/17/2011, 07/31/2018  . Td 07/17/2003  . Tdap 12/10/2013    Diabetes Related Lab Review: Lab Results  Component Value Date   HGBA1C 7.2 (A) 09/16/2019   HGBA1C 6.8 (A) 03/05/2019   HGBA1C 6.5 (A) 07/31/2018    Lab Results  Component Value Date   MICROALBUR <0.7 07/31/2018   Lab Results  Component Value Date   CREATININE 0.70 07/31/2018   BUN 8  07/31/2018   NA 143 07/31/2018   K 4.1 07/31/2018   CL 106 07/31/2018   CO2 28 07/31/2018   Lab Results  Component Value Date   CHOL 141 07/31/2018   CHOL 139 09/05/2017   CHOL 250 (H) 12/13/2016   Lab Results  Component Value Date   HDL 71.90 07/31/2018   HDL 65.10 09/05/2017   HDL 93 12/13/2016   Lab Results  Component Value Date   LDLCALC 46 07/31/2018   LDLCALC 46 09/05/2017   LDLCALC 119 (H) 12/13/2016   Lab Results  Component Value Date   TRIG 113.0 07/31/2018   TRIG 138.0 09/05/2017   TRIG 189 (H) 12/13/2016   Lab Results  Component Value Date   CHOLHDL 2 07/31/2018   CHOLHDL 2 09/05/2017   CHOLHDL 2.7 12/13/2016   No results found for: LDLDIRECT The 10-year ASCVD risk score Denman George DC Jr., et al., 2013) is: 15.6%   Values used to calculate the score:     Age: 81 years     Sex: Female     Is Non-Hispanic African American: No     Diabetic: Yes     Tobacco smoker: No     Systolic Blood Pressure: 138 mmHg     Is BP treated: Yes     HDL Cholesterol: 71.9 mg/dL     Total Cholesterol: 141 mg/dL I have reviewed the PMH, Fam and Soc history. Patient Active Problem List   Diagnosis Date Noted  . Benign essential HTN 08/29/2017    Priority: High  . Major depression, recurrent, chronic (HCC) 08/29/2017    Priority: High    Had been on zoloft for years: changed to paxil 2018 with good results. Added wellbutrin 07/2018 with good results   . Type 2 diabetes mellitus without complications (HCC) 03/09/2015    Priority: High  . Memory loss 03/08/2015    Priority: High  . Anxiety     Priority: High  . Mixed hyperlipidemia     Priority: High  . Hypothyroidism     Priority: High  . Restless leg syndrome 08/08/2018    Priority: Medium  . Macular degeneration 07/31/2018    Priority: Medium    2020   . S/P cervical discectomy 04/28/2018    Priority: Medium  . Carpal tunnel syndrome on right 08/29/2016    Priority: Medium  . 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/2016     Priority: Medium  . Dupuytren's contracture of left hand 12/21/2015    Priority: Medium  . Dysphagia, pharyngoesophageal phase 12/29/2014    Priority: Medium    Nl EGD 2019   . GERD (gastroesophageal reflux disease)     Priority: Medium  . Seborrheic dermatitis of scalp 05/13/2018    Priority: Low  . Eczema of both external ears 12/19/2017    Priority: Low  . Vitamin  D deficiency 03/03/2014    Priority: Low  . Allergy     Priority: Low  . 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  . History of alcoholism (HCC) 04/28/2018    Social History: Patient  reports that she has never smoked. She has never used smokeless tobacco. She reports that she does not drink alcohol or use drugs.  Review of Systems: Ophthalmic: negative for eye pain, loss of vision or double vision Cardiovascular: negative for chest pain Respiratory: negative for SOB or persistent cough Gastrointestinal: negative for abdominal pain Genitourinary: negative for dysuria or gross hematuria MSK: negative for foot lesions Neurologic: negative for weakness or gait disturbance  Objective  Vitals: BP 138/76 (BP Location: Right Arm, Patient Position: Sitting, Cuff Size: Large)   Pulse 85   Temp (!) 96.7 F (35.9 C) (Temporal)   Ht 4\' 11"  (1.499 m)   Wt 160 lb 3.2 oz (72.7 kg)   SpO2 95%   BMI 32.36 kg/m  General: well appearing, no acute distress  Psych:  Alert and oriented, stressed mood and affect, fair insight HEENT:  Normocephalic, atraumatic, moist mucous membranes, supple neck  Cardiovascular:  Nl S1 and S2, RRR without murmur, gallop or rub. no edema Respiratory:  Good breath sounds bilaterally, CTAB with normal effort, no rales Gastrointestinal: normal BS, soft, nontender Skin:  Warm, no rashes Neurologic:   Mental status is normal. normal gait Breast exam: normal bilaterally.    Diabetic education: ongoing education regarding chronic disease management for diabetes was given today. We continue to reinforce the  ABC's of diabetic management: A1c (<7 or 8 dependent upon patient), tight blood pressure control, and cholesterol management with goal LDL < 100 minimally. We discuss diet strategies, exercise recommendations, medication options and possible side effects. At each visit, we review recommended immunizations and preventive care recommendations for diabetics and stress that good diabetic control can prevent other problems. See below for this patient's data.    Commons side effects, risks, benefits, and alternatives for medications and treatment plan prescribed today were discussed, and the patient expressed understanding of the given instructions. Patient is instructed to call or message via MyChart if he/she has any questions or concerns regarding our treatment plan. No barriers to understanding were identified. We discussed Red Flag symptoms and signs in detail. Patient expressed understanding regarding what to do in case of urgent or emergency type symptoms.   Medication list was reconciled, printed and provided to the patient in AVS. Patient instructions and summary information was reviewed with the patient as documented in the AVS. This note was prepared with assistance of Dragon voice recognition software. Occasional wrong-word or sound-a-like substitutions may have occurred due to the inherent limitations of voice recognition software  This visit occurred during the SARS-CoV-2 public health emergency.  Safety protocols were in place, including screening questions prior to the visit, additional usage of staff PPE, and extensive cleaning of exam room while observing appropriate contact time as indicated for disinfecting solutions.

## 2019-09-16 NOTE — Patient Instructions (Signed)
Please return in 3 months for diabetes follow up  Please restart your jardiance.  I have given your anxiety medication to help with your situation.  If you have any questions or concerns, please don't hesitate to send me a message via MyChart or call the office at [phone]. Thank you for visiting with [place] today! It's our pleasure caring for you.  Psychiatrists  [facility]  [address] [place], [place] [address] [phone]  [facility] Corie Chiquito, NP [address] [place], [place] [address] [place], [place] [address] [phone]  [facility] Deatra Robinson, NP Verlon Au NP [address] [place], [place] [address] [phone]  Counseling centers only:  [facility] [address] [place], [place] [phone]  [facility]: [facility] [address] [place] [address] [place], [place] [phone]   [facility] [address] [place], [place] [address] [place], [place] [address] [phone]  [facility] [address] [place], [place] [address] [phone]  .

## 2019-09-17 ENCOUNTER — Telehealth: Payer: Self-pay | Admitting: Family Medicine

## 2019-09-17 ENCOUNTER — Other Ambulatory Visit: Payer: Self-pay

## 2019-09-17 LAB — COMPREHENSIVE METABOLIC PANEL
ALT: 15 U/L (ref 0–35)
AST: 17 U/L (ref 0–37)
Albumin: 4 g/dL (ref 3.5–5.2)
Alkaline Phosphatase: 106 U/L (ref 39–117)
BUN: 12 mg/dL (ref 6–23)
CO2: 30 mEq/L (ref 19–32)
Calcium: 9.2 mg/dL (ref 8.4–10.5)
Chloride: 100 mEq/L (ref 96–112)
Creatinine, Ser: 0.68 mg/dL (ref 0.40–1.20)
GFR: 86.2 mL/min (ref >60.00)
Glucose, Bld: 129 mg/dL — ABNORMAL HIGH (ref 70–99)
Potassium: 3.8 mEq/L (ref 3.5–5.1)
Sodium: 139 mEq/L (ref 135–145)
Total Bilirubin: 0.4 mg/dL (ref 0.2–1.2)
Total Protein: 6.5 g/dL (ref 6.0–8.3)

## 2019-09-17 LAB — CBC WITH DIFFERENTIAL/PLATELET
Basophils Absolute: 0.1 10*3/uL (ref 0.0–0.1)
Basophils Relative: 1.1 % (ref 0.0–3.0)
Eosinophils Absolute: 0 10*3/uL (ref 0.0–0.7)
Eosinophils Relative: 0.6 % (ref 0.0–5.0)
HCT: 38.6 % (ref 36.0–46.0)
Hemoglobin: 12.6 g/dL (ref 12.0–15.0)
Lymphocytes Relative: 27.1 % (ref 12.0–46.0)
Lymphs Abs: 2.1 10*3/uL (ref 0.7–4.0)
MCHC: 32.6 g/dL (ref 30.0–36.0)
MCV: 83.6 fl (ref 78.0–100.0)
Monocytes Absolute: 0.5 10*3/uL (ref 0.1–1.0)
Monocytes Relative: 6.8 % (ref 3.0–12.0)
Neutro Abs: 5.1 10*3/uL (ref 1.4–7.7)
Neutrophils Relative %: 64.4 % (ref 43.0–77.0)
Platelets: 246 10*3/uL (ref 150.0–400.0)
RBC: 4.62 Mil/uL (ref 3.87–5.11)
RDW: 17.3 % — ABNORMAL HIGH (ref 11.5–15.5)
WBC: 7.9 10*3/uL (ref 4.0–10.5)

## 2019-09-17 LAB — LIPID PANEL
Cholesterol: 177 mg/dL (ref 0–200)
HDL: 83.9 mg/dL (ref >39.00)
LDL Cholesterol: 62 mg/dL (ref 0–99)
NonHDL: 93.21
Total CHOL/HDL Ratio: 2
Triglycerides: 155 mg/dL — ABNORMAL HIGH (ref 0.0–149.0)
VLDL: 31 mg/dL (ref 0.0–40.0)

## 2019-09-17 LAB — MICROALBUMIN / CREATININE URINE RATIO
Creatinine,U: 31 mg/dL
Microalb Creat Ratio: 2.3 mg/g (ref 0.0–30.0)
Microalb, Ur: <0.7 mg/dL (ref 0.0–1.9)

## 2019-09-17 LAB — TSH: TSH: 1.07 u[IU]/mL (ref 0.35–4.50)

## 2019-09-17 LAB — VITAMIN D 25 HYDROXY (VIT D DEFICIENCY, FRACTURES): VITD: 18.5 ng/mL — ABNORMAL LOW (ref 30.00–100.00)

## 2019-09-17 NOTE — Telephone Encounter (Signed)
Patient states that it was never called in and asked if D[facility]Mardelle Matte could [facility]

## 2019-09-17 NOTE — Telephone Encounter (Signed)
  LAST APPOINTMENT DATE: 09/16/2019   NEXT APPOINTMENT DATE:@6 /10/2019  MEDICATION:clonazePAM (KLONOPIN) 0.5 MG tablet  [id number] [place], [place] - [address] Phone:  [phone]  Fax:  [phone]       **Let patient know to contact pharmacy at the end of the day to make sure medication is ready. **  ** Please notify patient to allow 48-72 hours to process**  **Encourage patient to contact the pharmacy for refills or they can request refills through [facility]**  CLINICAL FILLS OUT ALL BELOW:   LAST REFILL:  QTY:  REFILL DATE:    OTHER COMMENTS:    Okay for refill?  Please advise

## 2019-09-17 NOTE — Telephone Encounter (Signed)
Pt notified via vm that medication has been sent on 09/16/2019 and to [facility].

## 2019-09-18 ENCOUNTER — Telehealth: Payer: Self-pay

## 2019-09-18 NOTE — Telephone Encounter (Signed)
PA completed via CoverMyMeds Key: [id number] - PA Case ID: [id number] for clonazepam

## 2019-09-21 ENCOUNTER — Other Ambulatory Visit: Payer: Self-pay

## 2019-09-25 ENCOUNTER — Other Ambulatory Visit: Payer: Self-pay

## 2019-10-01 DIAGNOSIS — M47812 Spondylosis without myelopathy or radiculopathy, cervical region: Secondary | ICD-10-CM | POA: Diagnosis not present

## 2019-10-01 DIAGNOSIS — Z6834 Body mass index (BMI) 34.0-34.9, adult: Secondary | ICD-10-CM | POA: Diagnosis not present

## 2019-10-01 DIAGNOSIS — I10 Essential (primary) hypertension: Secondary | ICD-10-CM | POA: Diagnosis not present

## 2019-10-06 ENCOUNTER — Encounter: Payer: Self-pay | Admitting: Family Medicine

## 2019-10-06 NOTE — Progress Notes (Signed)
Letter printed and mailed to [facility]. Patient notified.

## 2019-10-07 DIAGNOSIS — M25561 Pain in right knee: Secondary | ICD-10-CM | POA: Diagnosis not present

## 2019-10-29 DIAGNOSIS — M47812 Spondylosis without myelopathy or radiculopathy, cervical region: Secondary | ICD-10-CM | POA: Diagnosis not present

## 2019-10-29 DIAGNOSIS — I10 Essential (primary) hypertension: Secondary | ICD-10-CM | POA: Diagnosis not present

## 2019-10-29 DIAGNOSIS — Z6834 Body mass index (BMI) 34.0-34.9, adult: Secondary | ICD-10-CM | POA: Diagnosis not present

## 2019-11-03 ENCOUNTER — Other Ambulatory Visit: Payer: Self-pay | Admitting: Family Medicine

## 2019-11-03 DIAGNOSIS — Z1231 Encounter for screening mammogram for malignant neoplasm of breast: Secondary | ICD-10-CM

## 2019-11-11 DIAGNOSIS — M7918 Myalgia, other site: Secondary | ICD-10-CM | POA: Diagnosis not present

## 2019-11-11 DIAGNOSIS — Z6834 Body mass index (BMI) 34.0-34.9, adult: Secondary | ICD-10-CM | POA: Diagnosis not present

## 2019-11-11 DIAGNOSIS — M47812 Spondylosis without myelopathy or radiculopathy, cervical region: Secondary | ICD-10-CM | POA: Diagnosis not present

## 2019-11-11 DIAGNOSIS — M25561 Pain in right knee: Secondary | ICD-10-CM | POA: Diagnosis not present

## 2019-11-12 DIAGNOSIS — E119 Type 2 diabetes mellitus without complications: Secondary | ICD-10-CM | POA: Diagnosis not present

## 2019-11-12 DIAGNOSIS — H5213 Myopia, bilateral: Secondary | ICD-10-CM | POA: Diagnosis not present

## 2019-11-12 DIAGNOSIS — H353131 Nonexudative age-related macular degeneration, bilateral, early dry stage: Secondary | ICD-10-CM | POA: Diagnosis not present

## 2019-11-12 DIAGNOSIS — H25013 Cortical age-related cataract, bilateral: Secondary | ICD-10-CM | POA: Diagnosis not present

## 2019-11-19 ENCOUNTER — Other Ambulatory Visit: Payer: Self-pay | Admitting: Family Medicine

## 2019-11-30 ENCOUNTER — Other Ambulatory Visit: Payer: Self-pay | Admitting: Family Medicine

## 2019-12-15 DIAGNOSIS — E1136 Type 2 diabetes mellitus with diabetic cataract: Secondary | ICD-10-CM | POA: Diagnosis not present

## 2019-12-15 DIAGNOSIS — H25013 Cortical age-related cataract, bilateral: Secondary | ICD-10-CM | POA: Diagnosis not present

## 2019-12-15 DIAGNOSIS — E119 Type 2 diabetes mellitus without complications: Secondary | ICD-10-CM | POA: Diagnosis not present

## 2019-12-15 DIAGNOSIS — H2513 Age-related nuclear cataract, bilateral: Secondary | ICD-10-CM | POA: Diagnosis not present

## 2019-12-15 DIAGNOSIS — H353132 Nonexudative age-related macular degeneration, bilateral, intermediate dry stage: Secondary | ICD-10-CM | POA: Diagnosis not present

## 2019-12-18 ENCOUNTER — Ambulatory Visit: Payer: Medicare HMO | Admitting: Family Medicine

## 2019-12-18 ENCOUNTER — Ambulatory Visit: Payer: Medicare HMO

## 2020-01-04 ENCOUNTER — Other Ambulatory Visit: Payer: Self-pay

## 2020-01-04 ENCOUNTER — Encounter: Payer: Self-pay | Admitting: Physician Assistant

## 2020-01-04 ENCOUNTER — Ambulatory Visit (INDEPENDENT_AMBULATORY_CARE_PROVIDER_SITE_OTHER): Payer: Medicare HMO | Admitting: Physician Assistant

## 2020-01-04 VITALS — BP 116/66 | HR 101 | Temp 98.3°F | Ht 59.0 in | Wt 159.2 lb

## 2020-01-04 DIAGNOSIS — M25531 Pain in right wrist: Secondary | ICD-10-CM | POA: Diagnosis not present

## 2020-01-04 DIAGNOSIS — G894 Chronic pain syndrome: Secondary | ICD-10-CM | POA: Diagnosis not present

## 2020-01-04 DIAGNOSIS — R399 Unspecified symptoms and signs involving the genitourinary system: Secondary | ICD-10-CM | POA: Diagnosis not present

## 2020-01-04 DIAGNOSIS — M25561 Pain in right knee: Secondary | ICD-10-CM | POA: Diagnosis not present

## 2020-01-04 DIAGNOSIS — M47812 Spondylosis without myelopathy or radiculopathy, cervical region: Secondary | ICD-10-CM | POA: Diagnosis not present

## 2020-01-04 DIAGNOSIS — Z79891 Long term (current) use of opiate analgesic: Secondary | ICD-10-CM | POA: Diagnosis not present

## 2020-01-04 DIAGNOSIS — Z79899 Other long term (current) drug therapy: Secondary | ICD-10-CM | POA: Diagnosis not present

## 2020-01-04 LAB — POCT URINALYSIS DIPSTICK
Bilirubin, UA: POSITIVE
Blood, UA: POSITIVE
Glucose, UA: POSITIVE — AB
Ketones, UA: POSITIVE
Nitrite, UA: POSITIVE
Protein, UA: POSITIVE — AB
Spec Grav, UA: 1.015 (ref 1.010–1.025)
Urobilinogen, UA: 2 E.U./dL — AB
pH, UA: 5 (ref 5.0–8.0)

## 2020-01-04 MED ORDER — CEPHALEXIN 500 MG PO CAPS
500.0000 mg | ORAL_CAPSULE | Freq: Three times a day (TID) | ORAL | 0 refills | Status: AC
Start: 2020-01-04 — End: 2020-01-09

## 2020-01-04 NOTE — Patient Instructions (Signed)
It was great to see you!  For your wrist: -Use the ACE bandage -We need to make sure your wrist is not fractured. Please get imaging of your wrist, either with your spine doctor today, or at the [facility] location: -You can walk in at [facility] without a scheduled appt. The address is [address] xray is located in the basement. Hours of operation are M-F 8:30am to 5:00pm. Closed for lunch between 12:30 and 1:00pm. -If not better in a week, please let me know. ALSO, you may want to pick up a better wrist brace if your pain isn't improving with each day.  For your UTI: -Start oral keflex antibiotic -I will be in touch with your urine culture results. -If not better in 1-2 days, please let me know.  Take care,  Jarold Motto PA-C

## 2020-01-04 NOTE — Progress Notes (Signed)
Marissa Thompson is a 70 y.o. female here for right wrist pain and possible UTI.  I acted as a Education administrator for [employer], PA-C [employer], [place]  History of Present Illness:   Chief Complaint  Patient presents with  . Wrist Pain  . Urinary Tract Infection    HPI  Wrist pain Patient was scraping paint off a fence on Friday when she noticed right wrist pain and swelling. She is taking gabapentin and ibuprofen for her symptoms.  Pain is 4/10 today. Also having some numbness and tingling to her 5th finger. Denies significant swelling, weakness, prior injury to R wrist. She does significant heavy lifting at work, she is an Corporate treasurer.  UTI Patient c/o of urinary urgency and pain when she tries to urinate. Symptoms started Saturday. Denies: back pain, vomiting, malaise, fever, chills. Does endorse fatigue. She took OTC Azo tablets today and yesterday. Has hx of UTI and endorses that these are her usual symptoms.   Past Medical History:  Diagnosis Date  . Allergy   . Anxiety 2003  . Cough    for over a year per pt-  . Depression 1998  . Diabetes mellitus without complication ([place]) 6010  . GERD (gastroesophageal reflux disease) 1980  . HLD (hyperlipidemia)   . Hyperlipidemia 1990  . Hypothyroidism 1998  . Macular degeneration 07/31/2018   2020  . PONV (postoperative nausea and vomiting)    1 time per pt in 2001  . SOB (shortness of breath) on exertion      Social History   Tobacco Use  . Smoking status: Never Smoker  . Smokeless tobacco: Never Used  Vaping Use  . Vaping Use: Never used  Substance Use Topics  . Alcohol use: No    Alcohol/week: 0.0 standard drinks  . Drug use: No    Past Surgical History:  Procedure Laterality Date  . ANTERIOR CERVICAL DISCECTOMY  02/2018  . BACK SURGERY     x3 Lumbar  . CARPAL TUNNEL RELEASE Left 03/08/2016   Procedure: LEFT CARPAL TUNNEL RELEASE;  Surgeon: Leanora Cover, MD;  Location: [facility];  Service: Orthopedics;   Laterality: Left;  . CERVICAL POLYPECTOMY N/A 08/17/2013   Procedure: CERVICAL POLYPECTOMY;  Surgeon: Sharene Butters, MD;  Location: [facility];  Service: Gynecology;  Laterality: N/A;  . COLON SURGERY  2007   benign mass  . ECTOPIC PREGNANCY SURGERY    . ESOPHAGEAL DILATION     x3  . HYSTEROSCOPY WITH D & C N/A 08/17/2013   Procedure: DILATATION AND CURETTAGE /HYSTEROSCOPY;  Surgeon: Sharene Butters, MD;  Location: [facility];  Service: Gynecology;  Laterality: N/A;  YAG LASER  . NECK SURGERY     c5-7 ACDF 3/15/has screws and plate in neck  . TONSILLECTOMY    . TRIGGER FINGER RELEASE Left 03/08/2016   Procedure: LEFT LONG TRIGGER RELEASE AND LEFT RING TRIGGER RELEASE;  Surgeon: Leanora Cover, MD;  Location: [facility];  Service: Orthopedics;  Laterality: Left;  . UPPER GASTROINTESTINAL ENDOSCOPY      Family History  Problem Relation Age of Onset  . Depression Mother   . Diabetes Mother   . Hypertension Mother   . Alcohol abuse Father   . Arthritis Father   . Cancer Father 68       Oral Cancer- had tongue, jaw resection--smoker and alcohol  . Alcohol abuse Brother     Allergies  Allergen Reactions  . Prednisone Rash  . Toradol [Ketorolac Tromethamine]  Other (See Comments)    Slurred speech, confusion  . Baclofen Other (See Comments)    Memory Loss and shakes  . Morphine And Related Itching  . Penicillins Rash    Has patient had a PCN reaction causing immediate rash, facial/tongue/throat swelling, SOB or lightheadedness with hypotension: Yes Has patient had a PCN reaction causing severe rash involving mucus membranes or skin necrosis: No Has patient had a PCN reaction that required hospitalization: No Has patient had a PCN reaction occurring within the last 10 years: No If all of the above answers are "NO", then may proceed with Cephalosporin use.   Durene Fruits [Pentazocine] Itching    Current Medications:   Current Outpatient Medications:  .  aspirin EC 81 MG  tablet, Take 81 mg by mouth daily., Disp: , Rfl:  .  clonazePAM (KLONOPIN) 0.5 MG tablet, Take 1 tablet (0.5 mg total) by mouth 2 (two) times daily as needed for anxiety., Disp: 60 tablet, Rfl: 5 .  cyclobenzaprine (FLEXERIL) 10 MG tablet, Take 1 tablet (10 mg total) by mouth 3 (three) times daily as needed for muscle spasms., Disp: 90 tablet, Rfl: 2 .  empagliflozin (JARDIANCE) 25 MG TABS tablet, TAKE 1 TABLET(25 MG) BY MOUTH DAILY., Disp: 30 tablet, Rfl: 5 .  gabapentin (NEURONTIN) 600 MG tablet, TAKE 2 TABLETS BY MOUTH 3 TIMES A DAY, Disp: 540 tablet, Rfl: 1 .  HYDROcodone-acetaminophen (NORCO) 10-325 MG tablet, Take 1 tablet by mouth every 4 (four) hours as needed for pain., Disp: , Rfl:  .  ibuprofen (ADVIL,MOTRIN) 200 MG tablet, Take 800 mg by mouth every 4 (four) hours as needed for fever, headache, moderate pain or cramping., Disp: , Rfl:  .  Insulin Pen Needle (CAREFINE PEN NEEDLES) 32G X 4 MM MISC, Use as directed, Disp: 100 each, Rfl: 11 .  Insulin Pen Needle (FIFTY50 PEN NEEDLES) 32G X 4 MM MISC, Use as directed, Disp: 100 each, Rfl: 11 .  levothyroxine (SYNTHROID) 112 MCG tablet, Take 1 tablet (112 mcg total) by mouth daily., Disp: 90 tablet, Rfl: 3 .  lisinopril (PRINIVIL,ZESTRIL) 5 MG tablet, Take 0.5 tablets (2.5 mg total) by mouth daily., Disp: 90 tablet, Rfl: 3 .  metFORMIN (GLUCOPHAGE) 1000 MG tablet, TAKE 1 TABLET (1,000 MG TOTAL) BY MOUTH 2 (TWO) TIMES DAILY WITH A MEAL., Disp: 180 tablet, Rfl: 2 .  Multiple Vitamins-Minerals (PRESERVISION AREDS 2 PO), Take 1 tablet by mouth 2 (two) times daily., Disp: , Rfl:  .  pantoprazole (PROTONIX) 40 MG tablet, Take 1 tablet (40 mg total) by mouth daily., Disp: 90 tablet, Rfl: 3 .  PARoxetine (PAXIL) 40 MG tablet, TAKE 1 TABLET BY MOUTH EVERY DAY, Disp: 90 tablet, Rfl: 1 .  pravastatin (PRAVACHOL) 40 MG tablet, TAKE 1 TABLET BY MOUTH EVERY DAY, Disp: 90 tablet, Rfl: 2 .  TOUJEO SOLOSTAR 300 UNIT/ML SOPN, Inject 40 Units into the skin at  bedtime., Disp: 13.5 pen, Rfl: 1 .  cephALEXin (KEFLEX) 500 MG capsule, Take 1 capsule (500 mg total) by mouth 3 (three) times daily for 5 days., Disp: 15 capsule, Rfl: 0   Review of Systems:   ROS  Negative unless otherwise specified per HPI.  Vitals:   Vitals:   01/04/20 1106  BP: 116/66  Pulse: (!) 101  Temp: 98.3 F (36.8 C)  TempSrc: Temporal  Weight: 159 lb 3.2 oz (72.2 kg)  Height: 4\' 11"  (1.499 m)     Body mass index is 32.15 kg/m.  Physical Exam:   Physical  Exam Vitals and nursing note reviewed.  Constitutional:      General: She is not in acute distress.    Appearance: She is well-developed. She is not ill-appearing or toxic-appearing.  Cardiovascular:     Rate and Rhythm: Normal rate and regular rhythm.     Pulses: Normal pulses.     Heart sounds: Normal heart sounds, S1 normal and S2 normal.     Comments: No LE edema Pulmonary:     Effort: Pulmonary effort is normal.     Breath sounds: Normal breath sounds.  Abdominal:     Tenderness: There is no right CVA tenderness or left CVA tenderness.  Musculoskeletal:     Comments: R wrist: Pain with palpation to distal ulna Pain with ulnar deviation and flexion No anatomical snuffbox tenderness No swelling  Skin:    General: Skin is warm and dry.  Neurological:     Mental Status: She is alert.     GCS: GCS eye subscore is 4. GCS verbal subscore is 5. GCS motor subscore is 6.     Comments: Normal perceived sensation to R wrist and R fingers Grip strength 5/5  Psychiatric:        Speech: Speech normal.        Behavior: Behavior normal. Behavior is cooperative.     Results for orders placed or performed in visit on 01/04/20  POCT Urinalysis Dipstick  Result Value Ref Range   Color, UA orange    Clarity, UA cloudy    Glucose, UA Positive (A) Negative   Bilirubin, UA positive    Ketones, UA positive    Spec Grav, UA 1.015 1.010 - 1.025   Blood, UA positive    pH, UA 5.0 5.0 - 8.0   Protein, UA  Positive (A) Negative   Urobilinogen, UA 2.0 (A) 0.2 or 1.0 E.U./dL   Nitrite, UA positive    Leukocytes, UA Large (3+) (A) Negative   Appearance     Odor      Assessment and Plan:   Porfiria was seen today for wrist pain and urinary tract infection.  Diagnoses and all orders for this visit:  UTI symptoms Symptoms consistent with acute cystitis. UA difficult to interpret 2/2 AZO use. Will start oral keflex (patient reports that she can tolerate this medication, given PCN allergy.) If no improvement in 1-2 days or worsening, needs to follow-up. Will be in touch with urine culture result and any further recommendations at that time. -     Cancel: Urine Culture -     POCT Urinalysis Dipstick -     Urine Culture  Right wrist pain Suspect sprain, however I did recommend xray to r/o acute abnormality, however she declined. She states that she is going to see her Spine & Scoliosis doctor today and will address her wrist with them. Did provide ACE bandage in office and wrapped her R wrist. Encouraged her to get a more immobilizing brace if she continues to have discomfort. I have also put in a future order for an xray at [facility] if she is unable to get imaging at her doctor later today. Regardless, I have asked her to reach out if no improvement in 1 week or if any worsening/new sx in the interim. I provided work note for patient to be out x 1 week. -     DG Wrist Complete Right; Future  Other orders -     cephALEXin (KEFLEX) 500 MG capsule; Take 1 capsule (500 mg total)  by mouth 3 (three) times daily for 5 days.   . Reviewed expectations re: course of current medical issues. . Discussed self-management of symptoms. . Outlined signs and symptoms indicating need for more acute intervention. . Patient verbalized understanding and all questions were answered. . See orders for this visit as documented in the electronic medical record. . Patient received an After-Visit Summary.  CMA or LPN served as  scribe during this visit. History, Physical, and Plan performed by medical provider. The above documentation has been reviewed and is accurate and complete.   Jarold Motto, PA-C

## 2020-01-06 LAB — URINE CULTURE
MICRO NUMBER:: 10614303
SPECIMEN QUALITY:: ADEQUATE

## 2020-01-07 DIAGNOSIS — H25812 Combined forms of age-related cataract, left eye: Secondary | ICD-10-CM | POA: Diagnosis not present

## 2020-01-07 DIAGNOSIS — H2512 Age-related nuclear cataract, left eye: Secondary | ICD-10-CM | POA: Diagnosis not present

## 2020-01-07 DIAGNOSIS — H25012 Cortical age-related cataract, left eye: Secondary | ICD-10-CM | POA: Diagnosis not present

## 2020-01-10 ENCOUNTER — Other Ambulatory Visit: Payer: Self-pay | Admitting: Family Medicine

## 2020-01-15 ENCOUNTER — Ambulatory Visit: Payer: Medicare HMO | Admitting: Family Medicine

## 2020-01-21 ENCOUNTER — Telehealth: Payer: Self-pay | Admitting: Family Medicine

## 2020-01-21 DIAGNOSIS — H25811 Combined forms of age-related cataract, right eye: Secondary | ICD-10-CM | POA: Diagnosis not present

## 2020-01-21 DIAGNOSIS — H25011 Cortical age-related cataract, right eye: Secondary | ICD-10-CM | POA: Diagnosis not present

## 2020-01-21 DIAGNOSIS — H2511 Age-related nuclear cataract, right eye: Secondary | ICD-10-CM | POA: Diagnosis not present

## 2020-01-21 NOTE — Telephone Encounter (Signed)
Left message for patient to call back and schedule Medicare Annual Wellness Visit (AWV) either virtually/audio only OR in office. Whatever the patients preference is.  No hx; please schedule at anytime with [facility]-Nurse Health Advisor at [facility].  This should be a 45 minute visit. OK TO BILL G0438  

## 2020-02-01 DIAGNOSIS — M25561 Pain in right knee: Secondary | ICD-10-CM | POA: Diagnosis not present

## 2020-02-01 DIAGNOSIS — Z6834 Body mass index (BMI) 34.0-34.9, adult: Secondary | ICD-10-CM | POA: Diagnosis not present

## 2020-02-01 DIAGNOSIS — M47812 Spondylosis without myelopathy or radiculopathy, cervical region: Secondary | ICD-10-CM | POA: Diagnosis not present

## 2020-02-12 ENCOUNTER — Other Ambulatory Visit: Payer: Self-pay | Admitting: Family Medicine

## 2020-02-18 ENCOUNTER — Emergency Department (HOSPITAL_COMMUNITY): Payer: Medicare HMO

## 2020-02-18 ENCOUNTER — Emergency Department (HOSPITAL_COMMUNITY)
Admission: EM | Admit: 2020-02-18 | Discharge: 2020-02-18 | Disposition: A | Discharge status: Home / Self Care | Payer: Medicare HMO | Attending: Emergency Medicine | Admitting: Emergency Medicine

## 2020-02-18 ENCOUNTER — Encounter (HOSPITAL_COMMUNITY): Payer: Self-pay | Admitting: Emergency Medicine

## 2020-02-18 DIAGNOSIS — R234 Changes in skin texture: Secondary | ICD-10-CM | POA: Diagnosis not present

## 2020-02-18 DIAGNOSIS — W19XXXA Unspecified fall, initial encounter: Secondary | ICD-10-CM | POA: Diagnosis not present

## 2020-02-18 DIAGNOSIS — Z5321 Procedure and treatment not carried out due to patient leaving prior to being seen by health care provider: Secondary | ICD-10-CM | POA: Insufficient documentation

## 2020-02-18 DIAGNOSIS — Y9289 Other specified places as the place of occurrence of the external cause: Secondary | ICD-10-CM | POA: Insufficient documentation

## 2020-02-18 DIAGNOSIS — Y9389 Activity, other specified: Secondary | ICD-10-CM | POA: Diagnosis not present

## 2020-02-18 DIAGNOSIS — Y998 Other external cause status: Secondary | ICD-10-CM | POA: Insufficient documentation

## 2020-02-18 DIAGNOSIS — S01109A Unspecified open wound of unspecified eyelid and periocular area, initial encounter: Secondary | ICD-10-CM | POA: Diagnosis not present

## 2020-02-18 DIAGNOSIS — S0191XA Laceration without foreign body of unspecified part of head, initial encounter: Secondary | ICD-10-CM | POA: Diagnosis not present

## 2020-02-18 DIAGNOSIS — G4489 Other headache syndrome: Secondary | ICD-10-CM | POA: Diagnosis not present

## 2020-02-18 DIAGNOSIS — S0990XA Unspecified injury of head, initial encounter: Secondary | ICD-10-CM | POA: Diagnosis not present

## 2020-02-18 DIAGNOSIS — W06XXXA Fall from bed, initial encounter: Secondary | ICD-10-CM | POA: Diagnosis not present

## 2020-02-18 NOTE — ED Triage Notes (Addendum)
Per EMS, patient from urgent care, fall last night hitting head on night stand. Hematoma to left side of head with lacerations with scabbing. Sent for XR. Hx plates in neck. 20g R AC from urgent care. Denies taking blood thinners. Denies LOC.

## 2020-02-18 NOTE — ED Notes (Signed)
Patient called for 3 times and no response

## 2020-02-22 DIAGNOSIS — M542 Cervicalgia: Secondary | ICD-10-CM | POA: Diagnosis not present

## 2020-02-22 DIAGNOSIS — M4322 Fusion of spine, cervical region: Secondary | ICD-10-CM | POA: Diagnosis not present

## 2020-02-22 DIAGNOSIS — M47812 Spondylosis without myelopathy or radiculopathy, cervical region: Secondary | ICD-10-CM | POA: Diagnosis not present

## 2020-02-23 ENCOUNTER — Telehealth: Payer: Self-pay | Admitting: Family Medicine

## 2020-02-23 NOTE — Progress Notes (Signed)
  Chronic Care Management   Note  02/23/2020 Name: Marissa Thompson MRN: [id number] DOB: 1950-11-21  Marissa Thompson is a 70 y.o. year old female who is a primary care patient of Willow Ora, MD. I reached out to [name] by phone today in response to a referral sent by Marissa Thompson PCP, Willow Ora, MD.   Marissa Thompson was given information about Chronic Care Management services today including:  1. CCM service includes personalized support from designated clinical staff supervised by her physician, including individualized plan of care and coordination with other care providers 2. 24/7 contact phone numbers for assistance for urgent and routine care needs. 3. Service will only be billed when office clinical staff spend 20 minutes or more in a month to coordinate care. 4. Only one practitioner may furnish and bill the service in a calendar month. 5. The patient may stop CCM services at any time (effective at the end of the month) by phone call to the office staff.   Patient agreed to services and verbal consent obtained.   Follow up plan:   Lynnae January [facility] Scheduler

## 2020-02-26 DIAGNOSIS — Z885 Allergy status to narcotic agent status: Secondary | ICD-10-CM | POA: Diagnosis not present

## 2020-02-26 DIAGNOSIS — E7800 Pure hypercholesterolemia, unspecified: Secondary | ICD-10-CM | POA: Diagnosis not present

## 2020-02-26 DIAGNOSIS — M79671 Pain in right foot: Secondary | ICD-10-CM | POA: Diagnosis not present

## 2020-02-26 DIAGNOSIS — M7731 Calcaneal spur, right foot: Secondary | ICD-10-CM | POA: Diagnosis not present

## 2020-02-26 DIAGNOSIS — K219 Gastro-esophageal reflux disease without esophagitis: Secondary | ICD-10-CM | POA: Diagnosis not present

## 2020-02-26 DIAGNOSIS — E119 Type 2 diabetes mellitus without complications: Secondary | ICD-10-CM | POA: Diagnosis not present

## 2020-02-26 DIAGNOSIS — M19071 Primary osteoarthritis, right ankle and foot: Secondary | ICD-10-CM | POA: Diagnosis not present

## 2020-02-26 DIAGNOSIS — Z880 Allergy status to penicillin: Secondary | ICD-10-CM | POA: Diagnosis not present

## 2020-02-26 DIAGNOSIS — E079 Disorder of thyroid, unspecified: Secondary | ICD-10-CM | POA: Diagnosis not present

## 2020-02-26 DIAGNOSIS — Z888 Allergy status to other drugs, medicaments and biological substances status: Secondary | ICD-10-CM | POA: Diagnosis not present

## 2020-02-29 ENCOUNTER — Ambulatory Visit: Payer: Medicare HMO | Admitting: Family Medicine

## 2020-03-10 ENCOUNTER — Other Ambulatory Visit: Payer: Self-pay | Admitting: Podiatry

## 2020-03-10 ENCOUNTER — Ambulatory Visit (INDEPENDENT_AMBULATORY_CARE_PROVIDER_SITE_OTHER): Payer: Medicare HMO

## 2020-03-10 ENCOUNTER — Encounter: Payer: Self-pay | Admitting: Podiatry

## 2020-03-10 ENCOUNTER — Other Ambulatory Visit: Payer: Self-pay

## 2020-03-10 ENCOUNTER — Ambulatory Visit: Payer: Medicare HMO | Admitting: Podiatry

## 2020-03-10 VITALS — BP 128/75 | HR 92 | Temp 97.3°F | Resp 16

## 2020-03-10 DIAGNOSIS — M79671 Pain in right foot: Secondary | ICD-10-CM

## 2020-03-10 DIAGNOSIS — M76821 Posterior tibial tendinitis, right leg: Secondary | ICD-10-CM

## 2020-03-11 NOTE — Progress Notes (Signed)
Subjective:   Patient ID: Marissa Thompson, female   DOB: 69 y.o.   MRN: [id number]   HPI Patient states she has had problems with the bottom of the right heel the arch that radiates into the big toe.  States is been going on 3 weeks and she is not sure of how it occurred but it is hard for her to ambulate.  Patient does not smoke likes to be active and last A1c was 7.2   Review of Systems  All other systems reviewed and are negative.       Objective:  Physical Exam Vitals and nursing note reviewed.  Constitutional:      Appearance: She is well-developed.  Pulmonary:     Effort: Pulmonary effort is normal.  Musculoskeletal:        General: Normal range of motion.  Skin:    General: Skin is warm.  Neurological:     Mental Status: She is alert.     Neurovascular status intact muscle strength found to be adequate range of motion was adequate.  Patient does have exquisite discomfort at the posterior tibial insertion into the navicular with mild depression of the arch and no indication of tendon dysfunction.  The big toe joint appears to be functioning well and patient has good digital perfusion well oriented x3     Assessment:  Posterior tibial tendinitis right with inflammation     Plan:  H&P x-ray reviewed condition discussed.  At this point I went ahead did sterile prep and injected the posterior tibial insertion into the navicular 3 mg dexamethasone Kenalog 5 mg Xylocaine applied fascial brace to lift up the arch and gave instructions for supportive therapy.  Patient will be seen back to recheck again in the next 3 weeks or earlier if needed and will reduce activity for the next few days  X-rays indicate mild increase in the navicular density knowing [place] of significant collapse medial longitudinal arch

## 2020-03-15 ENCOUNTER — Other Ambulatory Visit: Payer: Self-pay | Admitting: Family Medicine

## 2020-03-15 DIAGNOSIS — F418 Other specified anxiety disorders: Secondary | ICD-10-CM

## 2020-03-15 NOTE — Telephone Encounter (Signed)
LR: 09-16-2019 Qty: 60 with 5 refills  Last office visit: 01-04-2020 (Acute with Lelon Mast), 09-16-2019(CPE) Upcoming appointment: 04-01-2020

## 2020-03-19 ENCOUNTER — Other Ambulatory Visit: Payer: Self-pay | Admitting: Family Medicine

## 2020-03-31 ENCOUNTER — Other Ambulatory Visit: Payer: Self-pay

## 2020-03-31 DIAGNOSIS — E119 Type 2 diabetes mellitus without complications: Secondary | ICD-10-CM

## 2020-03-31 DIAGNOSIS — E039 Hypothyroidism, unspecified: Secondary | ICD-10-CM

## 2020-03-31 DIAGNOSIS — G2581 Restless legs syndrome: Secondary | ICD-10-CM

## 2020-03-31 DIAGNOSIS — K219 Gastro-esophageal reflux disease without esophagitis: Secondary | ICD-10-CM

## 2020-03-31 DIAGNOSIS — E782 Mixed hyperlipidemia: Secondary | ICD-10-CM

## 2020-04-01 ENCOUNTER — Ambulatory Visit: Payer: Medicare HMO | Admitting: Family Medicine

## 2020-04-01 DIAGNOSIS — Z0289 Encounter for other administrative examinations: Secondary | ICD-10-CM

## 2020-04-04 ENCOUNTER — Encounter: Payer: Self-pay | Admitting: Family Medicine

## 2020-04-04 ENCOUNTER — Other Ambulatory Visit: Payer: Self-pay

## 2020-04-04 ENCOUNTER — Ambulatory Visit (INDEPENDENT_AMBULATORY_CARE_PROVIDER_SITE_OTHER): Payer: Medicare HMO | Admitting: Family Medicine

## 2020-04-04 VITALS — BP 110/62 | HR 98 | Temp 98.2°F | Wt 160.2 lb

## 2020-04-04 DIAGNOSIS — Z794 Long term (current) use of insulin: Secondary | ICD-10-CM | POA: Diagnosis not present

## 2020-04-04 DIAGNOSIS — I10 Essential (primary) hypertension: Secondary | ICD-10-CM | POA: Diagnosis not present

## 2020-04-04 DIAGNOSIS — E782 Mixed hyperlipidemia: Secondary | ICD-10-CM

## 2020-04-04 DIAGNOSIS — R413 Other amnesia: Secondary | ICD-10-CM | POA: Diagnosis not present

## 2020-04-04 DIAGNOSIS — F419 Anxiety disorder, unspecified: Secondary | ICD-10-CM | POA: Diagnosis not present

## 2020-04-04 DIAGNOSIS — E119 Type 2 diabetes mellitus without complications: Secondary | ICD-10-CM | POA: Diagnosis not present

## 2020-04-04 DIAGNOSIS — Z79899 Other long term (current) drug therapy: Secondary | ICD-10-CM | POA: Diagnosis not present

## 2020-04-04 DIAGNOSIS — Z23 Encounter for immunization: Secondary | ICD-10-CM

## 2020-04-04 LAB — POCT GLYCOSYLATED HEMOGLOBIN (HGB A1C): Hemoglobin A1C: 6.9 % — AB (ref 4.0–5.6)
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
## 2020-04-04 NOTE — Patient Instructions (Signed)
Please return in 3 months for diabetes follow up  Please call [facility] to schedule an appointment with Dr. Colen Darling; she is a therapist here at our [place] office.  The phone number is: [phone]  If you have any questions or concerns, please don't hesitate to send me a message via MyChart or call the office at [phone]. Thank you for visiting with [place] today! It's our pleasure caring for you.  Psychiatry and Counseling  [facility] [address] [place], [place] [phone]  Psychiatrists  [facility]   [facility] Corie Chiquito, NP [address] [place], [place] [address] [place], [place] [address] [phone]  Dr. Archer Asa   [facility] [address] [place] [address] [place] [address] [phone]  Andee Poles, MD [facility] [address] [place] [address] [place] [address] [phone]  Therapists [facility] Deatra Robinson, NP Verlon Au NP And they have several counselors there as well.   [facility] [address] [place], [place] [phone]  [facility] [address] [place] [address] [place], [place] [phone]  [facility] [address] [place], [place] [address] [place], [place] [address] [phone]  [facility] for Psychotherapy [address] [place], [place] [address] [place], [place] [address] [phone]  [facility]  [facility] [address] [place], [place] [address] [phone]

## 2020-04-05 LAB — DRUG MONITORING, PANEL 8 WITH CONFIRMATION, URINE
6 Acetylmorphine: NEGATIVE ng/mL
Alcohol Metabolites: NEGATIVE ng/mL
Amphetamines: NEGATIVE ng/mL
Benzodiazepines: NEGATIVE ng/mL
Buprenorphine, Urine: NEGATIVE ng/mL
Cocaine Metabolite: NEGATIVE ng/mL
Creatinine: 19.6 mg/dL
MDMA: NEGATIVE ng/mL
Marijuana Metabolite: NEGATIVE ng/mL
Opiates: NEGATIVE ng/mL
Oxidant: NEGATIVE ug/mL
Oxycodone: NEGATIVE ng/mL
Specific Gravity: 1.008
pH: 5.2 (ref 4.5–9.0)

## 2020-04-05 LAB — DM TEMPLATE

## 2020-04-06 ENCOUNTER — Telehealth: Payer: Self-pay | Admitting: Family Medicine

## 2020-04-06 ENCOUNTER — Other Ambulatory Visit: Payer: Self-pay | Admitting: *Deleted

## 2020-04-06 ENCOUNTER — Ambulatory Visit: Payer: Medicare HMO

## 2020-04-06 DIAGNOSIS — I10 Essential (primary) hypertension: Secondary | ICD-10-CM

## 2020-04-06 DIAGNOSIS — E119 Type 2 diabetes mellitus without complications: Secondary | ICD-10-CM

## 2020-04-06 NOTE — Progress Notes (Signed)
.  cmm

## 2020-04-06 NOTE — Progress Notes (Unsigned)
Chronic Care Management Pharmacy  Name: Marissa Thompson  MRN: [id number] DOB: 1950/11/27  Chief Complaint/ HPI  Marissa Thompson,  69 y.o., female presents for their Initial CCM visit with the clinical pharmacist via telephone due to COVID-19 Pandemic.  PCP : Leamon Arnt, MD  Chronic conditions include:  No diagnosis found.  Office Visits:  ***  Consult Visit: *** Patient Active Problem List   Diagnosis Date Noted  . Restless leg syndrome 08/08/2018  . Macular degeneration 07/31/2018  . Seborrheic dermatitis of scalp 05/13/2018  . 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  . History of alcoholism ([place]) 04/28/2018  . S/P cervical discectomy 04/28/2018  . Eczema of both external ears 12/19/2017  . Benign essential HTN 08/29/2017  . Major depression, recurrent, chronic ([place]) 08/29/2017  . Carpal tunnel syndrome on right 08/29/2016  . 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 Dupuytren's contracture of left hand 12/21/2015  . Type 2 diabetes mellitus without complications ([place]) 58/83/2549  . Memory loss 03/08/2015  . Dysphagia, pharyngoesophageal phase 12/29/2014  . Vitamin D deficiency 03/03/2014  . Allergy   . Anxiety   . GERD (gastroesophageal reflux disease)   . Mixed hyperlipidemia   . Hypothyroidism    Past Surgical History:  Procedure Laterality Date  . ANTERIOR CERVICAL DISCECTOMY  02/2018  . BACK SURGERY     x3 Lumbar  . CARPAL TUNNEL RELEASE Left 03/08/2016   Procedure: LEFT CARPAL TUNNEL RELEASE;  Surgeon: Leanora Cover, MD;  Location: [facility];  Service: Orthopedics;  Laterality: Left;  . CATARACT EXTRACTION  6/21-7/21  . CERVICAL POLYPECTOMY N/A 08/17/2013   Procedure: CERVICAL POLYPECTOMY;  Surgeon: Sharene Butters, MD;  Location: [facility];  Service: Gynecology;  Laterality: N/A;  . COLON SURGERY  2007   benign mass  . ECTOPIC PREGNANCY SURGERY    . ESOPHAGEAL DILATION     x3  . EYE SURGERY    . HYSTEROSCOPY WITH D & C N/A 08/17/2013   Procedure:  DILATATION AND CURETTAGE /HYSTEROSCOPY;  Surgeon: Sharene Butters, MD;  Location: [facility];  Service: Gynecology;  Laterality: N/A;  YAG LASER  . NECK SURGERY     c5-7 ACDF 3/15/has screws and plate in neck  . TONSILLECTOMY    . TRIGGER FINGER RELEASE Left 03/08/2016   Procedure: LEFT LONG TRIGGER RELEASE AND LEFT RING TRIGGER RELEASE;  Surgeon: Leanora Cover, MD;  Location: [facility];  Service: Orthopedics;  Laterality: Left;  . UPPER GASTROINTESTINAL ENDOSCOPY     Family History  Problem Relation Age of Onset  . Depression Mother   . Diabetes Mother   . Hypertension Mother   . Alcohol abuse Father   . Arthritis Father   . Cancer Father 3       Oral Cancer- had tongue, jaw resection--smoker and alcohol  . Alcohol abuse Brother    Allergies  Allergen Reactions  . Toradol [Ketorolac Tromethamine] Other (See Comments)    Slurred speech, confusion  . Baclofen Other (See Comments)    Memory Loss and shakes  . Morphine And Related Itching  . Penicillins Rash    Has patient had a PCN reaction causing immediate rash, facial/tongue/throat swelling, SOB or lightheadedness with hypotension: Yes Has patient had a PCN reaction causing severe rash involving mucus membranes or skin necrosis: No Has patient had a PCN reaction that required hospitalization: No Has patient had a PCN reaction occurring within the last 10 years: No If all of the above answers are "NO", then  may proceed with Cephalosporin use.   Loma Messing [Pentazocine] Itching   Outpatient Encounter Medications as of 04/06/2020  Medication Sig  . aspirin EC 81 MG tablet Take 81 mg by mouth daily.  . clonazePAM (KLONOPIN) 0.5 MG tablet TAKE 1 TABLET BY MOUTH TWICE A DAY AS NEEDED FOR ANXIETY  . cyclobenzaprine (FLEXERIL) 10 MG tablet Take 1 tablet (10 mg total) by mouth 3 (three) times daily as needed for muscle spasms.  . empagliflozin (JARDIANCE) 25 MG TABS tablet TAKE 1 TABLET BY MOUTH DAILY  . gabapentin  (NEURONTIN) 600 MG tablet TAKE 2 TABLETS BY MOUTH 3 TIMES A DAY  . ibuprofen (ADVIL,MOTRIN) 200 MG tablet Take 800 mg by mouth every 4 (four) hours as needed for fever, headache, moderate pain or cramping.  . Insulin Pen Needle (CAREFINE PEN NEEDLES) 32G X 4 MM MISC Use as directed  . Insulin Pen Needle (FIFTY50 PEN NEEDLES) 32G X 4 MM MISC Use as directed  . levothyroxine (SYNTHROID) 112 MCG tablet TAKE 1 TABLET BY MOUTH EVERY DAY  . lisinopril (PRINIVIL,ZESTRIL) 5 MG tablet Take 0.5 tablets (2.5 mg total) by mouth daily.  . metFORMIN (GLUCOPHAGE) 1000 MG tablet TAKE 1 TABLET (1,000 MG TOTAL) BY MOUTH 2 (TWO) TIMES DAILY WITH A MEAL.  . Multiple Vitamins-Minerals (PRESERVISION AREDS 2 PO) Take 1 tablet by mouth 2 (two) times daily.  . pantoprazole (PROTONIX) 40 MG tablet Take 1 tablet (40 mg total) by mouth daily.  Marland Kitchen PARoxetine (PAXIL) 40 MG tablet TAKE 1 TABLET BY MOUTH EVERY DAY  . pravastatin (PRAVACHOL) 40 MG tablet TAKE 1 TABLET BY MOUTH EVERY DAY  . TOUJEO SOLOSTAR 300 UNIT/ML SOPN Inject 40 Units into the skin at bedtime.  . [DISCONTINUED] clonazePAM (KLONOPIN) 0.5 MG tablet TAKE 1 TABLET BY MOUTH EVERY DAY AS NEEDED FOR ANXIETY   No facility-administered encounter medications on file as of 04/06/2020.   Patient Care Team    Relationship Specialty Notifications Start End  Leamon Arnt, MD PCP - General Family Medicine  08/29/17   Starling Manns, MD Consulting Physician Orthopedic Surgery  10/17/17   Madelin Rear, [facility] Pharmacist Pharmacist  02/23/20    Comment: [phone]   Current Diagnosis/Assessment: Goals Addressed   None    Hypertension   BP goal <130/80  BP Readings from Last 3 Encounters:  04/04/20 110/62  03/10/20 128/75  02/18/20 124/77   Patient has failed these meds in the past: *** Patient checks BP at home {CHL HP BP Monitoring Frequency:[phone]} Patient home BP readings are ranging: ***  Patient is currently at goal on the following medications:   . Lisinopril 5 mg once daily   We discussed {CHL HP Upstream Pharmacy discussion:[phone]}.  Plan  Continue {CHL HP Upstream Pharmacy Plans:[phone]}.   Diabetes   A1c goal < ***%  Lab Results  Component Value Date/Time   HGBA1C 6.9 (A) 04/04/2020 01:49 PM   HGBA1C 7.2 (A) 09/16/2019 04:15 PM   HGBA1C 10.6 (H) 12/13/2016 08:32 AM   HGBA1C 9.1 (H) 03/01/2016 03:07 PM   MICROALBUR <0.7 09/16/2019 04:05 PM   MICROALBUR <0.7 07/31/2018 10:39 AM    Checking BG: {CHL HP Blood Glucose Monitoring Frequency:[phone]}. Recent FBG readings ***  Previous medications: Patient is currently {CHL Controlled/Uncontrolled:4304282765} on the following medications:  . ***  We discussed: {CHL HP Upstream Pharmacy discussion:[phone]}.  Plan  Continue {CHL HP Upstream Pharmacy Plans:[phone]}.  Hyperlipidemia   LDL goal < 70  Lipid Panel     Component Value Date/Time  CHOL 177 09/16/2019 1605   TRIG 155.0 (H) 09/16/2019 1605   HDL 83.90 09/16/2019 1605   LDLCALC 62 09/16/2019 1605    Hepatic Function Latest Ref Rng & Units 09/16/2019 07/31/2018 09/05/2017  Total Protein 6.0 - 8.3 g/dL 6.5 6.7 6.6  Albumin 3.5 - 5.2 g/dL 4.0 4.2 3.8  AST 0 - 37 U/L _0 ALT 0 - 35 U/L _1 Alk Phosphatase 39 - 117 U/L 106 128(H) 111  Total Bilirubin 0.2 - 1.2 mg/dL 0.4 0.4 0.4    The 10-year ASCVD risk score Mikey Bussing DC Jr., et al., 2013) is: 10.7%   Values used to calculate the score:     Age: 72 years     Sex: Female     Is Non-Hispanic African American: No     Diabetic: Yes     Tobacco smoker: No     Systolic Blood Pressure: 309 mmHg     Is BP treated: Yes     HDL Cholesterol: 83.9 mg/dL     Total Cholesterol: 177 mg/dL   Patient has failed these meds in past: *** Patient is currently at goal on the following medications:  . Pravastatin  We discussed:  {CHL HP Upstream Pharmacy discussion:[phone]}.  Plan  Continue {CHL HP Upstream Pharmacy  Plans:4191952552}.  Hypothyroidism   Lab Results  Component Value Date/Time   TSH 1.07 09/16/2019 04:05 PM   TSH 0.97 07/31/2018 10:39 AM   Patient has failed these meds in past: *** Patient is currently {CHL Controlled/Uncontrolled:[phone]} on the following medications:  . ***  We discussed:  {CHL HP Upstream Pharmacy discussion:[phone]}.  Plan  Continue {CHL HP Upstream Pharmacy Plans:4191952552}.  GERD   Patient {Actions; denies-reports:120008::"denies"} recent acid reflux.  Currently {CHL Controlled/Uncontrolled:[phone]} on: . ***  We discussed: Avoidance of potential triggers such as {causes; exacerbators GERD:13199}.  Plan   Continue {CHL HP Upstream Pharmacy Plans:4191952552}.  Osteopenia / Osteoporosis   Last DEXA Scan: ***   VITD  Date Value Ref Range Status  09/16/2019 18.50 (L) 30.00 - 100.00 ng/mL Final     Patient {is;is not an osteoporosis candidate:23886}  Patient has failed these meds in past: *** Patient is currently {CHL Controlled/Uncontrolled:[phone]} on the following medications:  . ***  We discussed:  {Osteoporosis Counseling:23892}.  Plan  Continue {CHL HP Upstream Pharmacy Plans:4191952552}.  Depression   PHQ9 SCORE ONLY 09/16/2019 03/05/2019 07/31/2018  PHQ-9 Total Score _2 Patient has failed these meds in past: *** Patient is currently {CHL Controlled/Uncontrolled:[phone]} on the following medications:  . ***  We discussed:  ***  Plan  Continue {CHL HP Upstream Pharmacy Plans:4191952552}.  ***   Patient has failed these meds in past: *** Patient is currently {CHL Controlled/Uncontrolled:[phone]} on the following medications:  . ***  We discussed:  ***  Plan  Continue {CHL HP Upstream Pharmacy Plans:4191952552}.  Vaccines   Immunization History  Administered Date(s) Administered  . Fluad Quad(high Dose 65+) 03/05/2019, 04/04/2020  . Influenza, High Dose Seasonal PF 08/29/2017, 04/03/2018   . Influenza-Unspecified 03/16/2013  . PFIZER SARS-COV-2 Vaccination 08/08/2019, 09/05/2019  . PPD Test 02/08/2014, 03/09/2015, 10/24/2015, 06/19/2016, 09/28/2019  . Pneumococcal Conjugate-13 08/29/2017  . Pneumococcal Polysaccharide-23 07/17/2011, 07/31/2018  . Td 07/17/2003  . Tdap 12/10/2013    Reviewed and discussed patient's vaccination history.    Plan  Recommended patient receive *** vaccine in ***.   Medication Management Coordination   Receives prescription medications from:  [id number] [place] [facility]  [address] [place] [address] Phone: [phone] Fax: [phone]    *** Plan  {US Pharmacy HEKB:52481}. ___________________________ SDOH (Social Determinants of Health) assessments performed: Yes.  Future Appointments  Date Time Provider [place]  04/06/2020  2:00 PM [facility] CCM PHARMACIST [facility]  07/13/2020  1:30 PM Leamon Arnt, MD [facility]   Visit follow-up:  . CPA follow-up: ***. Marland Kitchen RPH follow-up: *** month *** visit.  Madelin Rear, Pharm.D., BCGP Clinical Pharmacist [facility] [phone]

## 2020-04-06 NOTE — Progress Notes (Signed)
  Chronic Care Management   Note  04/06/2020 Name: Marissa Thompson MRN: [id number] DOB: [birth year]  Marissa Thompson is a 69 y.o. year old female who is a primary care patient of Willow Ora, MD. I reached out to [name] by phone today in response to a referral sent by [name]'s PCP, Willow Ora, MD.   [name] was given information about Chronic Care Management services today including:  1. CCM service includes personalized support from designated clinical staff supervised by her physician, including individualized plan of care and coordination with other care providers 2. 24/7 contact phone numbers for assistance for urgent and routine care needs. 3. Service will only be billed when office clinical staff spend 20 minutes or more in a month to coordinate care. 4. Only one practitioner may furnish and bill the service in a calendar month. 5. The patient may stop CCM services at any time (effective at the end of the month) by phone call to the office staff.   Patient wishes to consider information provided and/or speak with a member of the care team before deciding about enrollment in care management services.   Follow up plan:   [name] Upstream Scheduler

## 2020-04-11 ENCOUNTER — Telehealth: Payer: Self-pay | Admitting: Family Medicine

## 2020-04-11 NOTE — Chronic Care Management (AMB) (Signed)
  Chronic Care Management   Outreach Note  04/11/2020 Name: Marissa Thompson MRN: [id number] DOB: June 27, 1951  Referred by: Willow Ora, MD Reason for referral : No chief complaint on file.   An unsuccessful telephone outreach was attempted today. The patient was referred to the pharmacist for assistance with care management and care coordination.   Follow Up Plan:   [name] Upstream Scheduler

## 2020-04-12 DIAGNOSIS — M47812 Spondylosis without myelopathy or radiculopathy, cervical region: Secondary | ICD-10-CM | POA: Diagnosis not present

## 2020-04-14 ENCOUNTER — Telehealth: Payer: Self-pay | Admitting: Family Medicine

## 2020-04-14 NOTE — Chronic Care Management (AMB) (Signed)
  Chronic Care Management   Outreach Note  04/14/2020 Name: Marissa Thompson MRN: [id number] DOB: 1950-11-29  Referred by: Willow Ora, MD Reason for referral : No chief complaint on file.   Third unsuccessful telephone outreach was attempted today. The patient was referred to the pharmacist for assistance with care management and care coordination.   Follow Up Plan:   [name] Upstream Scheduler

## 2020-04-30 ENCOUNTER — Other Ambulatory Visit: Payer: Self-pay | Admitting: Family Medicine

## 2020-04-30 DIAGNOSIS — K219 Gastro-esophageal reflux disease without esophagitis: Secondary | ICD-10-CM

## 2020-05-28 ENCOUNTER — Other Ambulatory Visit: Payer: Self-pay | Admitting: Family Medicine

## 2020-06-27 ENCOUNTER — Other Ambulatory Visit: Payer: Self-pay | Admitting: Family Medicine

## 2020-07-13 ENCOUNTER — Ambulatory Visit (INDEPENDENT_AMBULATORY_CARE_PROVIDER_SITE_OTHER): Payer: Medicare HMO | Admitting: Family Medicine

## 2020-07-13 ENCOUNTER — Other Ambulatory Visit: Payer: Self-pay

## 2020-07-13 ENCOUNTER — Encounter: Payer: Self-pay | Admitting: Family Medicine

## 2020-07-13 VITALS — BP 128/72 | HR 85 | Temp 98.1°F | Resp 18 | Ht 59.0 in | Wt 151.0 lb

## 2020-07-13 DIAGNOSIS — E119 Type 2 diabetes mellitus without complications: Secondary | ICD-10-CM | POA: Diagnosis not present

## 2020-07-13 DIAGNOSIS — F4321 Adjustment disorder with depressed mood: Secondary | ICD-10-CM | POA: Diagnosis not present

## 2020-07-13 DIAGNOSIS — R300 Dysuria: Secondary | ICD-10-CM

## 2020-07-13 DIAGNOSIS — F339 Major depressive disorder, recurrent, unspecified: Secondary | ICD-10-CM | POA: Diagnosis not present

## 2020-07-13 DIAGNOSIS — Z794 Long term (current) use of insulin: Secondary | ICD-10-CM

## 2020-07-13 LAB — URINALYSIS, ROUTINE W REFLEX MICROSCOPIC
Bilirubin Urine: NEGATIVE
Ketones, ur: NEGATIVE
Nitrite: NEGATIVE
Specific Gravity, Urine: <=1.005 — AB (ref 1.000–1.030)
Total Protein, Urine: NEGATIVE
Urine Glucose: >=1000 — AB
Urobilinogen, UA: 0.2 (ref 0.0–1.0)
pH: 6 (ref 5.0–8.0)

## 2020-07-13 LAB — POCT GLYCOSYLATED HEMOGLOBIN (HGB A1C): Hemoglobin A1C: 6.7 % — AB (ref 4.0–5.6)

## 2020-07-13 MED ORDER — SHINGRIX 50 MCG/0.5ML IM SUSR: 0.5000 mL | Freq: Once | INTRAMUSCULAR | 0 refills | Status: AC

## 2020-07-13 MED ORDER — NITROFURANTOIN MONOHYD MACRO 100 MG PO CAPS: 100.0000 mg | ORAL_CAPSULE | Freq: Two times a day (BID) | ORAL | 0 refills | Status: DC

## 2020-07-13 NOTE — Patient Instructions (Addendum)
Please return in 3 months for your annual complete physical; please come fasting.  I have ordered antibiotics to start for a possible urinary tract infection. We will call you with your urine culture results to let you know if you need to complete them.   I am terribly sorry for your loss. Please see a grief counselor to help you.   Please call [facility] to schedule an appointment with Dr. Colen Darling; she is a therapist here at our [place] office.  The phone number is: [phone]  If you have any questions or concerns, please don't hesitate to send me a message via MyChart or call the office at [phone]. Thank you for visiting with [place] today! It's our pleasure caring for you.  Psychiatry and Counseling  [facility] [address] [place], [place] [phone]  Psychiatrists  [facility]   [facility] Corie Chiquito, NP [address] [place], [place] [address] [place], [place] [address] [phone]  Dr. Archer Asa   [facility] [address] [place] [address] [place] [address] [phone]  Andee Poles, MD [facility] [address] [place] [address] [place] [address] [phone]  Therapists [facility] Deatra Robinson, NP Verlon Au NP And they have several counselors there as well.   [facility] [address] [place], [place] [phone]  [facility] Counseling [address] [place] [address] [place], [place] [phone]  [facility] [address] [place], [place] [address] [place], [place] [address] [phone]  [facility] for Psychotherapy Associates for Psychotherapy [address] [place], [place] [address] [place], [place] [address] [phone]  [facility] [address] [place], [place] [address] [phone]

## 2020-07-13 NOTE — Progress Notes (Signed)
Subjective  CC:  Chief Complaint  Patient presents with   Diabetes   Urinary Tract Infection    Painful urination that started last week. She has taken otc AZO    HPI: Marissa Thompson is a 69 y.o. female who presents to the office today for follow up of diabetes and problems listed above in the chief complaint.   Grief: Patient's son died 11-24-2024from a drug overdose.  She is understandably distraught.  Struggling with mood, sadness, grief, appetite and sleep.  She was working up until yesterday.  Decided to leave her job given her mood symptoms.  She has longstanding uncontrolled depression.  She was thinking of seeing a psychiatrist which we discussed at her last visit however she deferred due to virtual visits only.  She may be open to trying it now though.  Diabetes follow up: Her diabetic control is reported as Unchanged.  She has not been eating due to her decreased appetite.  Weight is down 9 pounds.  She denies symptoms of low blood sugars.  She tolerates her medications. She denies exertional CP or SOB or symptomatic hypoglycemia. She denies foot sores or paresthesias.   Complains of dysuria over the last several days.  She is taking AZO.  She denies gross hematuria, flank pain or fevers.  No nausea or vomiting.   Wt Readings from Last 3 Encounters:  07/13/20 151 lb (68.5 kg)  04/04/20 160 lb 3.2 oz (72.7 kg)  01/04/20 159 lb 3.2 oz (72.2 kg)    BP Readings from Last 3 Encounters:  07/13/20 128/72  04/04/20 110/62  03/10/20 128/75    Assessment  1. Type 2 diabetes mellitus without complication, with long-term current use of insulin (HCC)   2. Major depression, recurrent, chronic (HCC)   3. Grief reaction   4. Dysuria      Plan   Diabetes is currently well controlled.  We will need to monitor her weight loss.  Continue current medications.    Grief and depression: Recommend grief counseling.  Options given.  Patient to call to schedule.  Continue Paxil.   Condolences given.  Possible UTI: Given her classic symptoms we will treat with Macrobid.  Await urine culture.  Hydration recommended.  Follow up: No follow-ups on file.. Orders Placed This Encounter  Procedures   Urine Culture   Urinalysis, Routine w reflex microscopic   POCT HgB A1C   Meds ordered this encounter  Medications   Zoster Vaccine Adjuvanted [facility]) injection    Sig: Inject 0.5 mLs into the muscle once for 1 dose. Please give 2nd dose 2-6 months after first dose    Dispense:  2 each    Refill:  0   nitrofurantoin, macrocrystal-monohydrate, (MACROBID) 100 MG capsule    Sig: Take 1 capsule (100 mg total) by mouth 2 (two) times daily.    Dispense:  10 capsule    Refill:  0      Immunization History  Administered Date(s) Administered   Fluad Quad(high Dose 65+) 03/05/2019, 04/04/2020   Influenza, High Dose Seasonal PF 08/29/2017, 04/03/2018   Influenza-Unspecified 03/16/2013   PFIZER SARS-COV-2 Vaccination 08/08/2019, 09/05/2019   PPD Test 02/08/2014, 03/09/2015, 10/24/2015, 06/19/2016, 09/28/2019   Pneumococcal Conjugate-13 08/29/2017   Pneumococcal Polysaccharide-23 07/17/2011, 07/31/2018   Td 07/17/2003   Tdap 12/10/2013    Diabetes Related Lab Review: Lab Results  Component Value Date   HGBA1C 6.7 (A) 07/13/2020   HGBA1C 6.9 (A) 04/04/2020   HGBA1C 7.2 (A) 09/16/2019  Lab Results  Component Value Date   MICROALBUR <0.7 09/16/2019   Lab Results  Component Value Date   CREATININE 0.68 09/16/2019   BUN 12 09/16/2019   NA 139 09/16/2019   K 3.8 09/16/2019   CL 100 09/16/2019   CO2 30 09/16/2019   Lab Results  Component Value Date   CHOL 177 09/16/2019   CHOL 141 07/31/2018   CHOL 139 09/05/2017   Lab Results  Component Value Date   HDL 83.90 09/16/2019   HDL 71.90 07/31/2018   HDL 65.10 09/05/2017   Lab Results  Component Value Date   LDLCALC 62 09/16/2019   LDLCALC 46 07/31/2018   LDLCALC 46 09/05/2017   Lab  Results  Component Value Date   TRIG 155.0 (H) 09/16/2019   TRIG 113.0 07/31/2018   TRIG 138.0 09/05/2017   Lab Results  Component Value Date   CHOLHDL 2 09/16/2019   CHOLHDL 2 07/31/2018   CHOLHDL 2 09/05/2017   No results found for: LDLDIRECT The 10-year ASCVD risk score Denman George DC Jr., et al., 2013) is: 16.1%   Values used to calculate the score:     Age: 24 years     Sex: Female     Is Non-Hispanic African American: No     Diabetic: Yes     Tobacco smoker: No     Systolic Blood Pressure: 128 mmHg     Is BP treated: Yes     HDL Cholesterol: 83.9 mg/dL     Total Cholesterol: 177 mg/dL I have reviewed the PMH, Fam and Soc history. Patient Active Problem List   Diagnosis Date Noted   Benign essential HTN 08/29/2017    Priority: High   Major depression, recurrent, chronic (HCC) 08/29/2017    Priority: High    Had been on zoloft for years: changed to paxil 2018 with good results. Added wellbutrin 07/2018 with good results    Type 2 diabetes mellitus without complications (HCC) 03/09/2015    Priority: High   Memory loss 03/08/2015    Priority: High   Anxiety     Priority: High   Mixed hyperlipidemia     Priority: High   Hypothyroidism     Priority: High   Restless leg syndrome 08/08/2018    Priority: Medium   Macular degeneration 07/31/2018    Priority: Medium    2020    S/P cervical discectomy 04/28/2018    Priority: Medium   Carpal tunnel syndrome on right 08/29/2016    Priority: Medium   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/2016    Priority: Medium   Dupuytren's contracture of left hand 12/21/2015    Priority: Medium   Dysphagia, pharyngoesophageal phase 12/29/2014    Priority: Medium    Nl EGD 2019    GERD (gastroesophageal reflux disease)     Priority: Medium   Seborrheic dermatitis of scalp 05/13/2018    Priority: Low   Eczema of both external ears 12/19/2017    Priority: Low   Vitamin D deficiency 03/03/2014    Priority: Low   Allergy      Priority: Low   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 She has never used smokeless tobacco. She reports that she does not drink alcohol and does not use drugs.  Review of Systems: Ophthalmic: negative for eye pain, loss of vision or double vision Cardiovascular: negative for chest pain Respiratory: negative for SOB or persistent cough  Gastrointestinal: negative for abdominal pain Genitourinary: negative for dysuria or gross hematuria MSK: negative for foot lesions Neurologic: negative for weakness or gait disturbance  Objective  Vitals: BP 128/72    Pulse 85    Temp 98.1 F (36.7 C) (Temporal)    Resp 18    Ht 4\' 11"  (1.499 m)    Wt 151 lb (68.5 kg)    SpO2 93%    BMI 30.50 kg/m  General: well appearing, no acute distress  Psych:  Alert and oriented, tearful with flat affect  HEENT:  Normocephalic, atraumatic, moist mucous membranes, supple neck  Cardiovascular:  Nl S1 and S2, RRR without murmur, gallop or rub. no edema Respiratory:  Good breath sounds bilaterally, CTAB with normal effort, no rales     Diabetic education: ongoing education regarding chronic disease management for diabetes was given today. We continue to reinforce the ABC's of diabetic management: A1c (<7 or 8 dependent upon patient), tight blood pressure control, and cholesterol management with goal LDL < 100 minimally. We discuss diet strategies, exercise recommendations, medication options and possible side effects. At each visit, we review recommended immunizations and preventive care recommendations for diabetics and stress that good diabetic control can prevent other problems. See below for this patient's data.    Commons side effects, risks, benefits, and alternatives for medications and treatment plan prescribed today were discussed, and the patient expressed understanding of the given instructions. Patient is  instructed to call or message via MyChart if he/she has any questions or concerns regarding our treatment plan. No barriers to understanding were identified. We discussed Red Flag symptoms and signs in detail. Patient expressed understanding regarding what to do in case of urgent or emergency type symptoms.   Medication list was reconciled, printed and provided to the patient in AVS. Patient instructions and summary information was reviewed with the patient as documented in the AVS. This note was prepared with assistance of Dragon voice recognition software. Occasional wrong-word or sound-a-like substitutions may have occurred due to the inherent limitations of voice recognition software  This visit occurred during the SARS-CoV-2 public health emergency.  Safety protocols were in place, including screening questions prior to the visit, additional usage of staff PPE, and extensive cleaning of exam room while observing appropriate contact time as indicated for disinfecting solutions.

## 2020-07-15 ENCOUNTER — Other Ambulatory Visit: Payer: Self-pay | Admitting: Family Medicine

## 2020-07-15 DIAGNOSIS — E119 Type 2 diabetes mellitus without complications: Secondary | ICD-10-CM

## 2020-07-15 LAB — URINE CULTURE
MICRO NUMBER:: 11366458
SPECIMEN QUALITY:: ADEQUATE

## 2020-09-02 DIAGNOSIS — M47812 Spondylosis without myelopathy or radiculopathy, cervical region: Secondary | ICD-10-CM | POA: Diagnosis not present

## 2020-09-02 DIAGNOSIS — M542 Cervicalgia: Secondary | ICD-10-CM | POA: Diagnosis not present

## 2020-09-02 DIAGNOSIS — M4322 Fusion of spine, cervical region: Secondary | ICD-10-CM | POA: Diagnosis not present

## 2020-09-09 ENCOUNTER — Other Ambulatory Visit: Payer: Self-pay | Admitting: Family Medicine

## 2020-09-20 ENCOUNTER — Other Ambulatory Visit: Payer: Self-pay | Admitting: Family Medicine

## 2020-09-20 DIAGNOSIS — F418 Other specified anxiety disorders: Secondary | ICD-10-CM

## 2020-10-11 ENCOUNTER — Other Ambulatory Visit: Payer: Self-pay

## 2020-10-11 ENCOUNTER — Encounter: Payer: Self-pay | Admitting: Family Medicine

## 2020-10-11 ENCOUNTER — Ambulatory Visit (INDEPENDENT_AMBULATORY_CARE_PROVIDER_SITE_OTHER): Payer: Medicare HMO | Admitting: Family Medicine

## 2020-10-11 VITALS — BP 124/68 | HR 100 | Temp 97.8°F | Resp 16 | Ht 59.0 in | Wt 157.4 lb

## 2020-10-11 DIAGNOSIS — E119 Type 2 diabetes mellitus without complications: Secondary | ICD-10-CM

## 2020-10-11 DIAGNOSIS — F419 Anxiety disorder, unspecified: Secondary | ICD-10-CM

## 2020-10-11 DIAGNOSIS — I10 Essential (primary) hypertension: Secondary | ICD-10-CM

## 2020-10-11 DIAGNOSIS — Z780 Asymptomatic menopausal state: Secondary | ICD-10-CM | POA: Diagnosis not present

## 2020-10-11 DIAGNOSIS — Z1231 Encounter for screening mammogram for malignant neoplasm of breast: Secondary | ICD-10-CM | POA: Diagnosis not present

## 2020-10-11 DIAGNOSIS — Z794 Long term (current) use of insulin: Secondary | ICD-10-CM

## 2020-10-11 DIAGNOSIS — Z0000 Encounter for general adult medical examination without abnormal findings: Secondary | ICD-10-CM

## 2020-10-11 DIAGNOSIS — E559 Vitamin D deficiency, unspecified: Secondary | ICD-10-CM | POA: Diagnosis not present

## 2020-10-11 DIAGNOSIS — Z79899 Other long term (current) drug therapy: Secondary | ICD-10-CM | POA: Diagnosis not present

## 2020-10-11 DIAGNOSIS — E782 Mixed hyperlipidemia: Secondary | ICD-10-CM

## 2020-10-11 DIAGNOSIS — K219 Gastro-esophageal reflux disease without esophagitis: Secondary | ICD-10-CM | POA: Diagnosis not present

## 2020-10-11 DIAGNOSIS — E039 Hypothyroidism, unspecified: Secondary | ICD-10-CM

## 2020-10-11 DIAGNOSIS — F339 Major depressive disorder, recurrent, unspecified: Secondary | ICD-10-CM

## 2020-10-11 DIAGNOSIS — F1021 Alcohol dependence, in remission: Secondary | ICD-10-CM

## 2020-10-11 DIAGNOSIS — G2581 Restless legs syndrome: Secondary | ICD-10-CM

## 2020-10-11 LAB — CBC WITH DIFFERENTIAL/PLATELET
Basophils Absolute: 0 10*3/uL (ref 0.0–0.1)
Basophils Relative: 0.8 % (ref 0.0–3.0)
Eosinophils Absolute: 0 10*3/uL (ref 0.0–0.7)
Eosinophils Relative: 0.8 % (ref 0.0–5.0)
HCT: 39.9 % (ref 36.0–46.0)
Hemoglobin: 12.8 g/dL (ref 12.0–15.0)
Lymphocytes Relative: 25.8 % (ref 12.0–46.0)
Lymphs Abs: 1.3 10*3/uL (ref 0.7–4.0)
MCHC: 32.2 g/dL (ref 30.0–36.0)
MCV: 83.6 fl (ref 78.0–100.0)
Monocytes Absolute: 0.3 10*3/uL (ref 0.1–1.0)
Monocytes Relative: 6.5 % (ref 3.0–12.0)
Neutro Abs: 3.4 10*3/uL (ref 1.4–7.7)
Neutrophils Relative %: 66.1 % (ref 43.0–77.0)
Platelets: 204 10*3/uL (ref 150.0–400.0)
RBC: 4.77 Mil/uL (ref 3.87–5.11)
RDW: 17.8 % — ABNORMAL HIGH (ref 11.5–15.5)
WBC: 5.2 10*3/uL (ref 4.0–10.5)

## 2020-10-11 LAB — LIPID PANEL
Cholesterol: 161 mg/dL (ref 0–200)
HDL: 66.9 mg/dL (ref >39.00)
NonHDL: 94.21
Total CHOL/HDL Ratio: 2
Triglycerides: 253 mg/dL — ABNORMAL HIGH (ref 0.0–149.0)
VLDL: 50.6 mg/dL — ABNORMAL HIGH (ref 0.0–40.0)

## 2020-10-11 LAB — COMPREHENSIVE METABOLIC PANEL
ALT: 15 U/L (ref 0–35)
AST: 15 U/L (ref 0–37)
Albumin: 4.1 g/dL (ref 3.5–5.2)
Alkaline Phosphatase: 92 U/L (ref 39–117)
BUN: 10 mg/dL (ref 6–23)
CO2: 28 mEq/L (ref 19–32)
Calcium: 8.9 mg/dL (ref 8.4–10.5)
Chloride: 100 mEq/L (ref 96–112)
Creatinine, Ser: 0.69 mg/dL (ref 0.40–1.20)
GFR: 89.14 mL/min (ref >60.00)
Glucose, Bld: 279 mg/dL — ABNORMAL HIGH (ref 70–99)
Potassium: 4.1 mEq/L (ref 3.5–5.1)
Sodium: 138 mEq/L (ref 135–145)
Total Bilirubin: 0.4 mg/dL (ref 0.2–1.2)
Total Protein: 6.4 g/dL (ref 6.0–8.3)

## 2020-10-11 LAB — MICROALBUMIN / CREATININE URINE RATIO
Creatinine,U: 96 mg/dL
Microalb Creat Ratio: 1 mg/g (ref 0.0–30.0)
Microalb, Ur: 1 mg/dL (ref 0.0–1.9)

## 2020-10-11 LAB — VITAMIN D 25 HYDROXY (VIT D DEFICIENCY, FRACTURES): VITD: 25.91 ng/mL — ABNORMAL LOW (ref 30.00–100.00)

## 2020-10-11 LAB — LDL CHOLESTEROL, DIRECT: Direct LDL: 61 mg/dL

## 2020-10-11 LAB — TSH: TSH: 1.62 u[IU]/mL (ref 0.35–4.50)

## 2020-10-11 MED ORDER — CITALOPRAM HYDROBROMIDE 20 MG PO TABS: 20.0000 mg | ORAL_TABLET | Freq: Every day | ORAL | 3 refills | Status: DC

## 2020-10-11 MED ORDER — EMPAGLIFLOZIN 25 MG PO TABS: 25.0000 mg | ORAL_TABLET | Freq: Every day | ORAL | 3 refills | Status: DC

## 2020-10-11 MED ORDER — SHINGRIX 50 MCG/0.5ML IM SUSR: 0.5000 mL | Freq: Once | INTRAMUSCULAR | 0 refills | Status: AC

## 2020-10-11 MED ORDER — PRAVASTATIN SODIUM 40 MG PO TABS: 40.0000 mg | ORAL_TABLET | Freq: Every evening | ORAL | 3 refills | Status: DC

## 2020-10-11 NOTE — Progress Notes (Signed)
Subjective  Chief Complaint  Patient presents with  . Annual Exam    Non-fasting  . Diabetes  . Depression    Did not est. With therapist. Does not remember if she called and made an appt that she missed or if she never called     HPI: Marissa Thompson is a 70 y.o. female who presents to [facility] [place] today for a Female Wellness Visit. She also has the concerns and/or needs as listed above in the chief complaint. These will be addressed in addition to the Health Maintenance Visit.   Wellness Visit: annual visit with health maintenance review and exam without Pap   Health maintenance: Patient is overdue for mammogram and never has had a bone density.  She is willing to go for both at this time.  Fortunately, she is feeling a little bit better.  Recently sold her home and is planning to move into her brother's home.  There continues to be significant stressors regarding the care of her brother due to his chronic illness.  He is currently in a nursing home.  She continues to grieve the loss of her son but is coping better.  She is eligible for Shingrix vaccination. Chronic disease f/u and/or acute problem visit: (deemed necessary to be done in addition to the wellness visit):  Diabetes: She is taking her medicines without side effects.  Feels sugars are stable.  Has not been checking home sugars.  No symptoms of hypoglycemia.  No foot sores.  Eye exam is up-to-date.  She is on low-dose ACE inhibitor for renal protection.  She is due microalbuminuria check.  Hypertension: Well-controlled on low-dose ACE inhibitor.  No chest pain or swelling in the lower extremities.  Hyperlipidemia: She takes Pravachol 40 mg nightly tolerates this well.  She is nonfasting for recheck today.  Hypothyroidism: Feels her energy levels are stable.  She is compliant with 112 mcg of Synthroid daily.  Major chronic depression with anxiety, situational: Taking Klonopin twice daily which has  been helpful.  Last urine drug screen did not show benzos in the urine.  We will recheck today.  Patient says she takes them twice a day and is compliant.  Also on Paxil.  However she feels of her depression remains uncontrolled.  She reports her sister and both nieces take Celexa and this is working well for her.  She has been on Zoloft and Wellbutrin in the past.  No suicidal ideation.  Vitamin D deficiency: Due for recheck.   Assessment  1. Annual physical exam   2. Major depression, recurrent, chronic (HCC) Chronic  3. Type 2 diabetes mellitus without complication, with long-term current use of insulin (HCC) Chronic  4. Benign essential HTN   5. Mixed hyperlipidemia   6. Acquired hypothyroidism   7. Vitamin D deficiency   8. Restless leg syndrome   9. Anxiety   10. Gastroesophageal reflux disease without esophagitis   11. Chronic prescription benzodiazepine use   12. History of alcoholism (HCC) Chronic  13. Encounter for screening mammogram for breast cancer   14. Asymptomatic menopausal state      Plan  Female Wellness Visit:  Age appropriate Health Maintenance and Prevention measures were discussed with patient. Included topics are cancer screening recommendations, ways to keep healthy (see AVS) including dietary and exercise recommendations, regular eye and dental care, use of seat belts, and avoidance of moderate alcohol use and tobacco use.  Mammogram and bone density ordered.  Patient  to schedule.  Discussed the importance.  BMI: discussed patient's BMI and encouraged positive lifestyle modifications to help get to or maintain a target BMI.  HM needs and immunizations were addressed and ordered. See below for orders. See HM and immunization section for updates.  Shingrix prescription given to patient to get at the pharmacy.  Routine labs and screening tests ordered including cmp, cbc and lipids where appropriate.  Discussed recommendations regarding Vit D and calcium  supplementation (see AVS)  Due for annual wellness visit.  Needs cognitive screen  Chronic disease management visit and/or acute problem visit:  Diabetes: Recheck A1c today.  Continue insulin, Jardiance, and Metformin.  Check urine for microalbuminuria.  Add low-dose ACE inhibitor.  She takes a statin.  Immunizations are up-to-date.  Eye exam due in June.  Depression anxiety: Marginally controlled.  Will wean off Paxil and change to Celexa.  Education given.  See after visit summary.  Continue twice daily Klonopin.  Check urine drug screen.  Chronic back pain: Per Ortho  Recheck thyroid levels and lipid levels.  Continue Pravachol 40 nightly.  Will adjust levothyroxine dose if indicated.  Currently euthyroid clinically.  GERD on chronic PPIs  Check vitamin D   Follow up: 3 months to check mood, memory and sugars Orders Placed This Encounter  Procedures  . MM DIGITAL SCREENING BILATERAL  . DG Bone Density  . CBC with Differential/Platelet  . Comprehensive metabolic panel  . Lipid panel  . TSH  . VITAMIN D 25 Hydroxy (Vit-D Deficiency, Fractures)  . Microalbumin / creatinine urine ratio   Meds ordered this encounter  Medications  . Zoster Vaccine Adjuvanted [facility]) injection    Sig: Inject 0.5 mLs into the muscle once for 1 dose. Please give 2nd dose 2-6 months after first dose    Dispense:  2 each    Refill:  0  . citalopram (CELEXA) 20 MG tablet    Sig: Take 1 tablet (20 mg total) by mouth daily.    Dispense:  30 tablet    Refill:  3  . empagliflozin (JARDIANCE) 25 MG TABS tablet    Sig: Take 1 tablet (25 mg total) by mouth daily.    Dispense:  90 tablet    Refill:  3  . pravastatin (PRAVACHOL) 40 MG tablet    Sig: Take 1 tablet (40 mg total) by mouth at bedtime.    Dispense:  90 tablet    Refill:  3      Body mass index is 31.79 kg/m. Wt Readings from Last 3 Encounters:  10/11/20 157 lb 6.4 oz (71.4 kg)  07/13/20 151 lb (68.5 kg)  04/04/20 160 lb 3.2 oz  (72.7 kg)     Patient Active Problem List   Diagnosis Date Noted  . Benign essential HTN 08/29/2017    Priority: High  . Major depression, recurrent, chronic (HCC) 08/29/2017    Priority: High    Had been on zoloft for years: changed to paxil 2018 with good results. Added wellbutrin 07/2018 with good results   . Type 2 diabetes mellitus without complications (HCC) 03/09/2015    Priority: High  . Memory loss 03/08/2015    Priority: High  . Anxiety     Priority: High  . Mixed hyperlipidemia     Priority: High  . Hypothyroidism     Priority: High  . Restless leg syndrome 08/08/2018    Priority: Medium  . Macular degeneration 07/31/2018    Priority: Medium    2020   .  S/P cervical discectomy 04/28/2018    Priority: Medium  . Carpal tunnel syndrome on right 08/29/2016    Priority: Medium  . 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 Dupuytren's contracture of left hand 12/21/2015    Priority: Medium  . Dysphagia, pharyngoesophageal phase 12/29/2014    Priority: Medium    Nl EGD 2019   . GERD (gastroesophageal reflux disease)     Priority: Medium  . Seborrheic dermatitis of scalp 05/13/2018    Priority: Low  . Eczema of both external ears 12/19/2017    Priority: Low  . Vitamin D deficiency 03/03/2014    Priority: Low  . Allergy     Priority: Low  . 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  . History of alcoholism (HCC) 04/28/2018   Health Maintenance  Topic Date Due  . MAMMOGRAM  02/27/2018  . OPHTHALMOLOGY EXAM  12/14/2020  . HEMOGLOBIN A1C  01/11/2021  . FOOT EXAM  04/04/2021  . TETANUS/TDAP  12/11/2023  . COLONOSCOPY (Pts 45-44yrs Insurance coverage will need to be confirmed)  07/26/2027  . INFLUENZA VACCINE  Completed  . COVID-19 Vaccine  Completed  . Hepatitis C Screening  Completed  . PNA vac Low Risk Adult  Completed  . HPV VACCINES  Aged Out  . DEXA SCAN  Discontinued   Immunization History  Administered Date(s) Administered  . Fluad Quad(high Dose  65+) 03/05/2019, 04/04/2020  . Influenza, High Dose Seasonal PF 08/29/2017, 04/03/2018  . Influenza-Unspecified 03/16/2013  . PFIZER(Purple Top)SARS-COV-2 Vaccination 08/08/2019, 09/05/2019, 05/09/2020  . PPD Test 02/08/2014, 03/09/2015, 10/24/2015, 06/19/2016, 09/28/2019  . Pneumococcal Conjugate-13 08/29/2017  . Pneumococcal Polysaccharide-23 07/17/2011, 07/31/2018  . Td 07/17/2003  . Tdap 12/10/2013   We updated and reviewed the patient's past history in detail and it is documented below. Allergies: Patient is allergic to toradol [ketorolac tromethamine], baclofen, morphine and related, penicillins, and talwin [pentazocine]. Past Medical History Patient  has a past medical history of Allergy, Anxiety (2003), Cough, Depression (1998), Diabetes mellitus without complication (HCC) (1998), GERD (gastroesophageal reflux disease) (1980), HLD (hyperlipidemia), Hyperlipidemia (1990), Hypothyroidism (1998), Macular degeneration (07/31/2018), Macular degeneration (12/2019), PONV (postoperative nausea and vomiting), and SOB (shortness of breath) on exertion. Past Surgical History Patient  has a past surgical history that includes Tonsillectomy; Ectopic pregnancy surgery; Back surgery; Hysteroscopy with D & C (N/A, 08/17/2013); Cervical polypectomy (N/A, 08/17/2013); Neck surgery; Carpal tunnel release (Left, 03/08/2016); Trigger finger release (Left, 03/08/2016); Colon surgery (2007); Upper gastrointestinal endoscopy; Esophageal dilation; Anterior cervical discectomy (02/2018); Eye surgery; and Cataract extraction (6/21-7/21). Family History: Patient family history includes Alcohol abuse in her brother and father; Arthritis in her father; Cancer (age of onset: 42) in her father; Depression in her mother; Diabetes in her mother; Hypertension in her mother. Social History:  Patient  reports that she has never smoked. She has never used smokeless tobacco. She reports that she does not drink alcohol and does not  use drugs.  Review of Systems: Constitutional: negative for fever or malaise Ophthalmic: negative for photophobia, double vision or loss of vision Cardiovascular: negative for chest pain, dyspnea on exertion, or new LE swelling Respiratory: negative for SOB or persistent cough Gastrointestinal: negative for abdominal pain, change in bowel habits or melena Genitourinary: negative for dysuria or gross hematuria, no abnormal uterine bleeding or disharge Musculoskeletal: negative for new gait disturbance or muscular weakness Integumentary: negative for new or persistent rashes, no breast lumps Neurological: negative for TIA or stroke symptoms Psychiatric: negative for SI or delusions Allergic/Immunologic: negative for hives  Patient Care Team    Relationship Specialty Notifications Start End  Willow OraAndy, Eliyahu Bille L, MD PCP - General Family Medicine  08/29/17   Patricia Nettleohen, Max W, MD Consulting Physician Orthopedic Surgery  10/17/17   Dahlia ByesPotts, Jacob, [facility] CenterRPH Pharmacist Pharmacist  02/23/20    Comment: [phone]  Josue HectorAvila-Duran, Carolyn, PA-C Orthopedic Physician Assistant - Certified Physician Assistant  10/11/20     Objective  Vitals: BP 124/68   Pulse 100   Temp 97.8 F (36.6 C) (Temporal)   Resp 16   Ht 4\' 11"  (1.499 m)   Wt 157 lb 6.4 oz (71.4 kg)   SpO2 97%   BMI 31.79 kg/m  General:  Well developed, well nourished, no acute distress  Psych:  Alert and orientedx3,normal mood and affect, normal cognition HEENT:  Normocephalic, atraumatic, non-icteric sclera,  supple neck without adenopathy, mass or thyromegaly Cardiovascular:  Normal S1, S2, RRR without gallop, rub or murmur Respiratory:  Good breath sounds bilaterally, CTAB with normal respiratory effort Gastrointestinal: normal bowel sounds, soft, non-tender, no noted masses. No HSM MSK: no deformities, contusions. Joints are without erythema or swelling.  Skin:  Warm, no rashes or suspicious lesions noted Neurologic:    Mental status is normal.  CN 2-11 are normal. Gross motor and sensory exams are normal. Normal gait. No tremor Breast Exam: No mass, skin retraction or nipple discharge is appreciated in either breast. No axillary adenopathy. Fibrocystic changes are not noted    Commons side effects, risks, benefits, and alternatives for medications and treatment plan prescribed today were discussed, and the patient expressed understanding of the given instructions. Patient is instructed to call or message via MyChart if he/she has any questions or concerns regarding our treatment plan. No barriers to understanding were identified. We discussed Red Flag symptoms and signs in detail. Patient expressed understanding regarding what to do in case of urgent or emergency type symptoms.   Medication list was reconciled, printed and provided to the patient in AVS. Patient instructions and summary information was reviewed with the patient as documented in the AVS. This note was prepared with assistance of Dragon voice recognition software. Occasional wrong-word or sound-a-like substitutions may have occurred due to the inherent limitations of voice recognition software  This visit occurred during the SARS-CoV-2 public health emergency.  Safety protocols were in place, including screening questions prior to the visit, additional usage of staff PPE, and extensive cleaning of exam room while observing appropriate contact time as indicated for disinfecting solutions.

## 2020-10-11 NOTE — Patient Instructions (Addendum)
Please return in 3 months to recheck mood. Please schedule an AWV with Inetta Fermo. We will check your memory at this visit.   If you have any questions or concerns, please don't hesitate to send me a message via MyChart or call the office at [phone]. Thank you for visiting with [place] today! It's our pleasure caring for you.  Please wean off the paxil as follows: Week one:Take 1/2 tablet (20mg ) daily for one week,  Week two: Then take 1/2 tab (20mg ) every other day for one week Week three: Then take 1/2 tab twice a week; during this week, start the celexa once a day.  The 4th week, stop the paxil and only take the celexa daily.   I have ordered a mammogram and/or bone density for you as we discussed today: [x]   Mammogram  [x]   Bone Density  Please call the office checked below to schedule your appointment:  [x]   [facility]      [address] [place],                []   [facility]  [address] [place], [place]  [phone]  Please take the prescription for Shingrix to the pharmacy so they may administer the vaccinations. Your insurance will then cover the injections.

## 2020-10-12 ENCOUNTER — Other Ambulatory Visit (INDEPENDENT_AMBULATORY_CARE_PROVIDER_SITE_OTHER): Payer: Medicare HMO

## 2020-10-12 DIAGNOSIS — Z794 Long term (current) use of insulin: Secondary | ICD-10-CM | POA: Diagnosis not present

## 2020-10-12 DIAGNOSIS — E119 Type 2 diabetes mellitus without complications: Secondary | ICD-10-CM | POA: Diagnosis not present

## 2020-10-12 LAB — HEMOGLOBIN A1C: Hgb A1c MFr Bld: 8.9 % — ABNORMAL HIGH (ref 4.6–6.5)

## 2020-10-12 NOTE — Progress Notes (Signed)
Please add on a1c, dx: diabetes. It got missed yesterday. Thanks.  Thanks, Dr. Mardelle Matte '

## 2020-10-12 NOTE — Progress Notes (Signed)
A1C added

## 2020-10-12 NOTE — Progress Notes (Signed)
Please call patient: I have reviewed his/her lab results.  Diabetes is uncontrolled again.  A1c was 8.9.  Recommend increasing insulin to 45 units nightly for the next several weeks.  Work on [employer] again.  May need to go a little bit higher on the insulin if needed, would like fasting sugars to be less than 130 routinely.  She should start rechecking again..  Continue other medications.  In office visit in 3 months to recheck.  Also recommend vitamin D 4000 units daily.  Other labs are all stable

## 2020-10-13 LAB — DRUG MONITORING, PANEL 8 WITH CONFIRMATION, URINE
6 Acetylmorphine: NEGATIVE ng/mL (ref <10)
Alcohol Metabolites: NEGATIVE ng/mL
Amphetamines: NEGATIVE ng/mL (ref <500)
Buprenorphine, Urine: NEGATIVE ng/mL (ref <5)
Cocaine Metabolite: NEGATIVE ng/mL (ref <150)
Creatinine: 92.5 mg/dL
MDMA: NEGATIVE ng/mL (ref <500)
Marijuana Metabolite: NEGATIVE ng/mL (ref <20)
Opiates: NEGATIVE ng/mL (ref <100)
Oxidant: NEGATIVE ug/mL
Oxycodone: NEGATIVE ng/mL (ref <100)
pH: 5.4 (ref 4.5–9.0)

## 2020-10-13 LAB — DM TEMPLATE

## 2020-10-14 NOTE — Progress Notes (Signed)
Labs reviewed.unfortunately, benzo drug screen was canceled due to insufficient sample. Will reorder with next visit.

## 2020-10-17 ENCOUNTER — Other Ambulatory Visit: Payer: Self-pay

## 2020-10-17 DIAGNOSIS — E119 Type 2 diabetes mellitus without complications: Secondary | ICD-10-CM

## 2020-10-17 DIAGNOSIS — F418 Other specified anxiety disorders: Secondary | ICD-10-CM

## 2020-10-17 MED ORDER — EMPAGLIFLOZIN 25 MG PO TABS: 25.0000 mg | ORAL_TABLET | Freq: Every day | ORAL | 3 refills | Status: DC

## 2020-10-17 MED ORDER — TOUJEO SOLOSTAR 300 UNIT/ML ~~LOC~~ SOPN: 45.0000 [IU] | PEN_INJECTOR | Freq: Every day | SUBCUTANEOUS | 3 refills | Status: DC

## 2020-10-17 NOTE — Telephone Encounter (Signed)
error 

## 2020-11-02 ENCOUNTER — Other Ambulatory Visit: Payer: Self-pay | Admitting: Family Medicine

## 2020-11-02 DIAGNOSIS — Z13820 Encounter for screening for osteoporosis: Secondary | ICD-10-CM

## 2020-11-02 DIAGNOSIS — Z1231 Encounter for screening mammogram for malignant neoplasm of breast: Secondary | ICD-10-CM

## 2020-11-02 DIAGNOSIS — Z780 Asymptomatic menopausal state: Secondary | ICD-10-CM

## 2020-11-19 ENCOUNTER — Other Ambulatory Visit: Payer: Self-pay | Admitting: Family Medicine

## 2020-11-24 ENCOUNTER — Ambulatory Visit (HOSPITAL_COMMUNITY)
Admission: EM | Admit: 2020-11-24 | Discharge: 2020-11-24 | Disposition: A | Discharge status: Home / Self Care | Payer: Medicare HMO

## 2020-11-24 ENCOUNTER — Encounter (HOSPITAL_COMMUNITY): Payer: Self-pay

## 2020-11-24 ENCOUNTER — Other Ambulatory Visit: Payer: Self-pay

## 2020-11-24 DIAGNOSIS — W19XXXA Unspecified fall, initial encounter: Secondary | ICD-10-CM | POA: Diagnosis not present

## 2020-11-24 DIAGNOSIS — M25521 Pain in right elbow: Secondary | ICD-10-CM | POA: Diagnosis not present

## 2020-11-24 DIAGNOSIS — L989 Disorder of the skin and subcutaneous tissue, unspecified: Secondary | ICD-10-CM

## 2020-11-24 NOTE — Discharge Instructions (Addendum)
-  Wash your wound with gentle soap and water 1-2 times daily.  Let air dry or gently pat. You can apply an over-the-counter antibiotic ointment on top to help prevent infection . -Follow-up with your primary care provider in about 2 days to ensure resolution of the skin lesion on your leg. -Seek additional immediate medical attention if you develop new symptoms like fever/chills, discharge of the wound, new or worsening redness surrounding the skin lesion, dizziness, flulike symptoms, etc.

## 2020-11-24 NOTE — ED Triage Notes (Signed)
Pt c/o being bitten by two ticks. She states the area is sore and red. She states she was able to take one out.

## 2020-11-24 NOTE — ED Provider Notes (Signed)
[facility]    CSN: [id number] Arrival date & time: 11/24/20  1126      History   Chief Complaint Chief Complaint  Patient presents with  . Tick Removal    Right calf  . Generalized Body Aches    HPI Marissa Thompson is a 70 y.o. female presenting for tick removal.  This patient has a medical history of memory loss, alcoholism, diabetes.  She states that she is concerned there is still a tick on her right posterior calf.  States she removed 1 tick about 1 week ago, but few days later a second tick appeared that she has been unable to remove.  States she also had a few scabs on her stomach and arm before this leg lesion appeared.  She initially states she has body aches, but upon further questioning states she actually has left shoulder and bilateral elbow pain following a fall that occurred about a week ago.  Denies generalized body aches, fever/chills, erythema migrans, headaches, fatigue, other skin rashes or lesions. Denies head trauma, LOC, dizziness, headaches, memory changes.   HPI  Past Medical History:  Diagnosis Date  . Allergy   . Anxiety 2003  . Cough    for over a year per pt-  . Depression 1998  . Diabetes mellitus without complication (HCC) 1998  . GERD (gastroesophageal reflux disease) 1980  . HLD (hyperlipidemia)   . Hyperlipidemia 1990  . Hypothyroidism 1998  . Macular degeneration 07/31/2018   2020  . Macular degeneration 12/2019  . PONV (postoperative nausea and vomiting)    1 time per pt in 2001  . SOB (shortness of breath) on exertion     Patient Active Problem List   Diagnosis Date Noted  . Restless leg syndrome 08/08/2018  . Macular degeneration 07/31/2018  . Seborrheic dermatitis of scalp 05/13/2018  . 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  . History of alcoholism (HCC) 04/28/2018  . S/P cervical discectomy 04/28/2018  . Eczema of both external ears 12/19/2017  . Benign essential HTN 08/29/2017  . Major depression, recurrent, chronic  (HCC) 08/29/2017  . Carpal tunnel syndrome on right 08/29/2016  . 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 Dupuytren's contracture of left hand 12/21/2015  . Type 2 diabetes mellitus without complications (HCC) 03/09/2015  . Memory loss 03/08/2015  . Dysphagia, pharyngoesophageal phase 12/29/2014  . Vitamin D deficiency 03/03/2014  . Allergy   . Anxiety   . GERD (gastroesophageal reflux disease)   . Mixed hyperlipidemia   . Hypothyroidism     Past Surgical History:  Procedure Laterality Date  . ANTERIOR CERVICAL DISCECTOMY  02/2018  . BACK SURGERY     x3 Lumbar  . CARPAL TUNNEL RELEASE Left 03/08/2016   Procedure: LEFT CARPAL TUNNEL RELEASE;  Surgeon: Betha Loa, MD;  Location: [facility];  Service: Orthopedics;  Laterality: Left;  . CATARACT EXTRACTION  6/21-7/21  . CERVICAL POLYPECTOMY N/A 08/17/2013   Procedure: CERVICAL POLYPECTOMY;  Surgeon: Mickel Baas, MD;  Location: [facility];  Service: Gynecology;  Laterality: N/A;  . COLON SURGERY  2007   benign mass  . ECTOPIC PREGNANCY SURGERY    . ESOPHAGEAL DILATION     x3  . EYE SURGERY    . HYSTEROSCOPY WITH D & C N/A 08/17/2013   Procedure: DILATATION AND CURETTAGE /HYSTEROSCOPY;  Surgeon: Mickel Baas, MD;  Location: [facility];  Service: Gynecology;  Laterality: N/A;  YAG LASER  . NECK SURGERY     c5-7 ACDF 3/15/has screws  and plate in neck  . TONSILLECTOMY    . TRIGGER FINGER RELEASE Left 03/08/2016   Procedure: LEFT LONG TRIGGER RELEASE AND LEFT RING TRIGGER RELEASE;  Surgeon: Betha LoaKevin Kuzma, MD;  Location: [facility];  Service: Orthopedics;  Laterality: Left;  . UPPER GASTROINTESTINAL ENDOSCOPY      OB History   No obstetric history on file.      Home Medications    Prior to Admission medications   Medication Sig Start Date End Date Taking? Authorizing Provider  aspirin EC 81 MG tablet Take 81 mg by mouth daily.    [provider]  citalopram (CELEXA) 20 MG tablet TAKE 1 TABLET BY MOUTH  EVERY DAY 11/21/20   Willow OraAndy, Camille L, MD  clonazePAM (KLONOPIN) 0.5 MG tablet TAKE 1 TABLET BY MOUTH TWICE A DAY AS NEEDED FOR ANXIETY 09/21/20   Willow OraAndy, Camille L, MD  cyclobenzaprine (FLEXERIL) 10 MG tablet Take 1 tablet (10 mg total) by mouth 3 (three) times daily as needed for muscle spasms. 07/03/18   Willow OraAndy, Camille L, MD  empagliflozin (JARDIANCE) 25 MG TABS tablet Take 1 tablet (25 mg total) by mouth daily. 10/17/20   Willow OraAndy, Camille L, MD  gabapentin (NEURONTIN) 600 MG tablet TAKE 2 TABLETS BY MOUTH 3 TIMES A DAY 05/30/20   Orland MustardWolfe, Allison, MD  ibuprofen (ADVIL,MOTRIN) 200 MG tablet Take 800 mg by mouth every 4 (four) hours as needed for fever, headache, moderate pain or cramping.    [provider]  Insulin Pen Needle (CAREFINE PEN NEEDLES) 32G X 4 MM MISC Use as directed 06/16/15   Donita BrooksPickard, Warren T, MD  Insulin Pen Needle (FIFTY50 PEN NEEDLES) 32G X 4 MM MISC Use as directed 04/15/17   Donita BrooksPickard, Warren T, MD  levothyroxine (SYNTHROID) 112 MCG tablet TAKE 1 TABLET BY MOUTH EVERY DAY 09/19/20   Willow OraAndy, Camille L, MD  lisinopril (PRINIVIL,ZESTRIL) 5 MG tablet Take 0.5 tablets (2.5 mg total) by mouth daily. 05/13/18   Willow OraAndy, Camille L, MD  metFORMIN (GLUCOPHAGE) 1000 MG tablet TAKE 1 TABLET BY MOUTH 2 TIMES DAILY WITH A MEAL. 06/27/20   Willow OraAndy, Camille L, MD  Multiple Vitamins-Minerals (PRESERVISION AREDS 2 PO) Take 1 tablet by mouth 2 (two) times daily.    [provider]  pantoprazole (PROTONIX) 40 MG tablet TAKE 1 TABLET BY MOUTH EVERY DAY 05/02/20   Willow OraAndy, Camille L, MD  pravastatin (PRAVACHOL) 40 MG tablet Take 1 tablet (40 mg total) by mouth at bedtime. 10/11/20   Willow OraAndy, Camille L, MD  TOUJEO SOLOSTAR 300 UNIT/ML Solostar Pen Inject 45 Units into the skin at bedtime. 10/17/20   Willow OraAndy, Camille L, MD    Family History Family History  Problem Relation Age of Onset  . Depression Mother   . Diabetes Mother   . Hypertension Mother   . Alcohol abuse Father   . Arthritis Father   . Cancer Father  [id number]       Oral Cancer- had tongue, jaw resection--smoker and alcohol  . Alcohol abuse Brother     Social History Social History   Tobacco Use  . Smoking status: Never Smoker  . Smokeless tobacco: Never Used  Vaping Use  . Vaping Use: Never used  Substance Use Topics  . Alcohol use: No    Alcohol/week: 0.0 standard drinks  . Drug use: No     Allergies   Toradol [ketorolac tromethamine], Baclofen, Morphine and related, Penicillins, and Talwin [pentazocine]   Review of Systems Review of Systems  Musculoskeletal:  Elbow and shoulder pain  Skin: Positive for rash.  All other systems reviewed and are negative.    Physical Exam Triage Vital Signs ED Triage Vitals  Enc Vitals Group     BP 11/24/20 1210 (!) 123/106     Pulse Rate 11/24/20 1210 85     Resp 11/24/20 1210 17     Temp 11/24/20 1210 99.2 F (37.3 C)     Temp Source 11/24/20 1210 Oral     SpO2 11/24/20 1210 96 %     Weight --      Height --      Head Circumference --      Peak Flow --      Pain Score 11/24/20 1209 3     Pain Loc --      Pain Edu? --      Excl. in GC? --    No data found.  Updated Vital Signs BP (!) 123/106 (BP Location: Right Arm)   Pulse 85   Temp 99.2 F (37.3 C) (Oral)   Resp 17   SpO2 96%   Visual Acuity Right Eye Distance:   Left Eye Distance:   Bilateral Distance:    Right Eye Near:   Left Eye Near:    Bilateral Near:     Physical Exam Vitals reviewed.  Constitutional:      General: She is not in acute distress.    Appearance: Normal appearance. She is not ill-appearing or diaphoretic.  HENT:     Head: Normocephalic and atraumatic.  Eyes:     Extraocular Movements: Extraocular movements intact.     Pupils: Pupils are equal, round, and reactive to light.  Cardiovascular:     Rate and Rhythm: Normal rate and regular rhythm.     Heart sounds: Normal heart sounds.  Pulmonary:     Effort: Pulmonary effort is normal.     Breath sounds: Normal breath sounds.   Musculoskeletal:     Comments: R elbow with minimal tenderness to palpation. ROM intact and without pain. No bony deformity. Shoulders and l elbow completely benign exam. No abrasion.   Skin:    General: Skin is warm.     Capillary Refill: Capillary refill takes less than 2 seconds.     Comments: Few excoriations on abdomen and bilateral wrists. R proximal posterior calf with small .5cm scab that appears to be healing well. No tick present. 89mm surrounding erythema consistent with normal wound healing. No warmth or discharge.   Neurological:     General: No focal deficit present.     Mental Status: She is alert and oriented to person, place, and time.     Comments: PERRLA, EOMI. CN 2-12 grossly intact.  Psychiatric:        Mood and Affect: Mood normal.        Behavior: Behavior normal.        Thought Content: Thought content normal.        Judgment: Judgment normal.      UC Treatments / Results  Labs (all labs ordered are listed, but only abnormal results are displayed) Labs Reviewed - No data to display  EKG   Radiology No results found.  Procedures Procedures (including critical care time)  Medications Ordered in UC Medications - No data to display  Initial Impression / Assessment and Plan / UC Course  I have reviewed the triage vital signs and the nursing notes.  Pertinent labs & imaging results that were available during my care  of the patient were reviewed by me and considered in my medical decision making (see chart for details).     This patient is a 70 year old female presenting with concern for tick. On exam, there is absolutely no tick present.  There are however multiple excoriations on her abdomen, wrists, right calf.  She is afebrile, nontachycardic.  Wounds appear to be healing well and without infection.  For fall, benign neuro exam, though patient does have some memory changes at baseline.  No bony abnormality.  Reassurance provided, but recommended  close follow-up with PCP as I suspect lesions are related to picking and not tick bite.  Discussed signs and symptoms of Lyme disease and when to seek additional medical attention.  ED return precautions discussed.  Final Clinical Impressions(s) / UC Diagnoses   Final diagnoses:  Skin lesion  Fall, initial encounter  Right elbow pain     Discharge Instructions     -Wash your wound with gentle soap and water 1-2 times daily.  Let air dry or gently pat. You can apply an over-the-counter antibiotic ointment on top to help prevent infection . -Follow-up with your primary care provider in about 2 days to ensure resolution of the skin lesion on your leg. -Seek additional immediate medical attention if you develop new symptoms like fever/chills, discharge of the wound, new or worsening redness surrounding the skin lesion, dizziness, flulike symptoms, etc.     ED Prescriptions    None     PDMP not reviewed this encounter.   Rhys Martini, PA-C 11/24/20 1238

## 2020-11-29 DIAGNOSIS — M47812 Spondylosis without myelopathy or radiculopathy, cervical region: Secondary | ICD-10-CM | POA: Diagnosis not present

## 2020-11-29 DIAGNOSIS — M4322 Fusion of spine, cervical region: Secondary | ICD-10-CM | POA: Diagnosis not present

## 2020-11-29 DIAGNOSIS — M542 Cervicalgia: Secondary | ICD-10-CM | POA: Diagnosis not present

## 2020-12-19 ENCOUNTER — Other Ambulatory Visit: Payer: Self-pay | Admitting: Family Medicine

## 2020-12-29 DIAGNOSIS — Z79891 Long term (current) use of opiate analgesic: Secondary | ICD-10-CM | POA: Diagnosis not present

## 2020-12-29 DIAGNOSIS — M545 Low back pain, unspecified: Secondary | ICD-10-CM | POA: Diagnosis not present

## 2020-12-29 DIAGNOSIS — Z79899 Other long term (current) drug therapy: Secondary | ICD-10-CM | POA: Diagnosis not present

## 2020-12-29 DIAGNOSIS — M47896 Other spondylosis, lumbar region: Secondary | ICD-10-CM | POA: Diagnosis not present

## 2020-12-29 DIAGNOSIS — M5136 Other intervertebral disc degeneration, lumbar region: Secondary | ICD-10-CM | POA: Diagnosis not present

## 2020-12-29 DIAGNOSIS — G894 Chronic pain syndrome: Secondary | ICD-10-CM | POA: Diagnosis not present

## 2020-12-29 DIAGNOSIS — M47897 Other spondylosis, lumbosacral region: Secondary | ICD-10-CM | POA: Diagnosis not present

## 2020-12-29 DIAGNOSIS — M4326 Fusion of spine, lumbar region: Secondary | ICD-10-CM | POA: Diagnosis not present

## 2021-02-03 ENCOUNTER — Encounter: Payer: Self-pay | Admitting: Family Medicine

## 2021-02-03 ENCOUNTER — Ambulatory Visit (INDEPENDENT_AMBULATORY_CARE_PROVIDER_SITE_OTHER): Payer: Medicare HMO | Admitting: Family Medicine

## 2021-02-03 ENCOUNTER — Other Ambulatory Visit: Payer: Self-pay

## 2021-02-03 VITALS — BP 111/70 | HR 84 | Temp 97.6°F | Ht 59.0 in | Wt 157.4 lb

## 2021-02-03 DIAGNOSIS — Z794 Long term (current) use of insulin: Secondary | ICD-10-CM

## 2021-02-03 DIAGNOSIS — F339 Major depressive disorder, recurrent, unspecified: Secondary | ICD-10-CM | POA: Diagnosis not present

## 2021-02-03 DIAGNOSIS — E119 Type 2 diabetes mellitus without complications: Secondary | ICD-10-CM | POA: Diagnosis not present

## 2021-02-03 DIAGNOSIS — N952 Postmenopausal atrophic vaginitis: Secondary | ICD-10-CM

## 2021-02-03 LAB — POCT GLYCOSYLATED HEMOGLOBIN (HGB A1C): Hemoglobin A1C: 7.2 % — AB (ref 4.0–5.6)

## 2021-02-03 MED ORDER — PREMARIN 0.625 MG/GM VA CREA: 0.5000 g | TOPICAL_CREAM | VAGINAL | 0 refills | Status: DC

## 2021-02-03 MED ORDER — TOUJEO SOLOSTAR 300 UNIT/ML ~~LOC~~ SOPN: 40.0000 [IU] | PEN_INJECTOR | Freq: Every day | SUBCUTANEOUS | 3 refills | Status: DC

## 2021-02-03 NOTE — Patient Instructions (Signed)
Please return in 3 months for diabetes follow up   Start checking your fasting sugars. We want them 80-120 consistently. The toujeo dose affects this number in addition to what you eat for dinner and at night. I'd like you to take the same dose of toujeo nightly. If sugars are running high, you can adjust up the dose by a couple of units and then monitor it again.   If you have any questions or concerns, please don't hesitate to send me a message via MyChart or call the office at [phone]. Thank you for visiting with [place] today! It's our pleasure caring for you.   Atrophic Vaginitis  Atrophic vaginitis is a condition in which the tissues that line the vagina become dry and thin. This condition is most common in women who have stopped having regular menstrual periods (are in menopause). This usually starts when a woman is 60 to 69 years old. That is the timewhen a woman's estrogen levels begin to decrease. Estrogen is a female hormone. It helps to keep the tissues of the vagina moist. It stimulates the vagina to produce a clear fluid that lubricates the vagina for sex. This fluid also protects the vagina from infection. Lack of estrogen can cause the lining of the vagina to get thinner and dryer. The vagina may also shrink in size. It may become less elastic. Atrophic vaginitis tends toget worse over time as a woman's estrogen level drops. What are the causes? This condition is caused by the normal drop in estrogen that happens around thetime of menopause. What increases the risk? Certain conditions or situations may lower a woman's estrogen level, leading to a higher risk for atrophic vaginitis. You are more likely to develop this condition if: You are taking medicines that block estrogen. You have had your ovaries removed. You are being treated for cancer with radiation or medicines (chemotherapy). You have given birth or are breastfeeding. You are older than age 44. You smoke. What are the  signs or symptoms? Symptoms of this condition include: Pain, soreness, a feeling of pressure, or bleeding during sex (dyspareunia). Vaginal burning, irritation, or itching. Pain or bleeding when a speculum is used in a vaginal exam. Having burning pain while urinating. Vaginal discharge. In some cases, there are no symptoms. How is this diagnosed? This condition is diagnosed based on your medical history and a physical exam. This will include a pelvic exam that checks the vaginal tissues. Though rare, you may also have other tests, including: A urine test. A test that checks the acid balance in your vagina (acid balance test). How is this treated? Treatment for this condition depends on how severe your symptoms are. Treatment may include: Using an over-the-counter vaginal lubricant before sex. Using a long-acting vaginal moisturizer. Using low-dose estrogen for moderate to severe symptoms that do not respond to other treatments. Options include creams, tablets, and inserts (vaginal rings). Before you use a vaginal estrogen, tell your health care provider if you have a history of: Breast cancer. Endometrial cancer. Blood clots. If you are not sexually active and your symptoms are very mild, you may notneed treatment. Follow these instructions at home: Medicines Take over-the-counter and prescription medicines only as told by your health care provider. Do not use herbal or alternative medicines unless your health care provider says that you can. Use over-the-counter creams, lubricants, or moisturizers for dryness only as told by your health care provider. General instructions If your atrophic vaginitis is caused by menopause, discuss all  of your menopause symptoms and treatment options with your health care provider. Do not douche. Do not use products that can make your vagina dry. These include: Scented feminine sprays. Scented tampons. Scented soaps. Vaginal sex can help to improve  blood flow and elasticity of vaginal tissue. If you choose to have sex and it hurts, try using a water-soluble lubricant or moisturizer right before having sex. Contact a health care provider if: Your discharge looks different than normal. Your vagina has an unusual smell. You have new symptoms. Your symptoms do not improve with treatment. Your symptoms get worse. Summary Atrophic vaginitis is a condition in which the tissues that line the vagina become dry and thin. It is most common in women who have stopped having regular menstrual periods (are in menopause). Treatment options include using vaginal lubricants and low-dose vaginal estrogen. Contact a health care provider if your vagina has an unusual smell, or if your symptoms get worse or do not improve after treatment. This information is not intended to replace advice given to you by your health care provider. Make sure you discuss any questions you have with your healthcare provider. Document Revised: 12/31/2019 Document Reviewed: 12/31/2019 Elsevier Patient Education  2022 [employer].

## 2021-02-03 NOTE — Progress Notes (Signed)
Subjective  CC:  Chief Complaint  Patient presents with   Medication Management    Patient wants to talk about Rx hormones     HPI: Marissa Thompson is a 70 y.o. female who presents to the office today for follow up of diabetes and problems listed above in the chief complaint.  Diabetes follow up: Her diabetic control is reported as Improved. On toujeo and using 40 units per night on avg although she varies it based on her nighttime sugars. Had one low of 80.  She denies exertional CP or SOB or recurrente symptomatic hypoglycemia. She denies foot sores or paresthesias.  Depression: much improved. Has moved and is living alone. Less stressed. No longer working. Mood has improved.  Going to see a female friend soon. Has h/o vaginal atrophy with difficulty intercourse because of it. Would like to know if there are estrogens that could be helpful. No sxs of infection.   Wt Readings from Last 3 Encounters:  02/03/21 157 lb 6.4 oz (71.4 kg)  10/11/20 157 lb 6.4 oz (71.4 kg)  07/13/20 151 lb (68.5 kg)    BP Readings from Last 3 Encounters:  02/03/21 111/70  11/24/20 (!) 123/106  10/11/20 124/68    Assessment  1. Type 2 diabetes mellitus without complication, without long-term current use of insulin (HCC)   2. Type 2 diabetes mellitus without complication, with long-term current use of insulin (HCC)   3. Vaginal atrophy   4. Major depression, recurrent, chronic (HCC)      Plan  Diabetes is currently marginally controlled. Improved. Education on titration of insulin. Work to get fastings controlled. Check postprandials. Continue diabtetic diet. Rcheck 3 monthss Vaginal atropy: disucssed risks/benefits of HRT. Will use short termlow dose topical estrogen. Vaginal premarin 2-3x/weekly for several months.  Depression: improving. Partly due to circumstances. Continue antidepressants.   Follow up: 3 months for diabetic recheck. . Orders Placed This Encounter  Procedures   POCT  glycosylated hemoglobin (Hb A1C)   Meds ordered this encounter  Medications   conjugated estrogens (PREMARIN) vaginal cream    Sig: Place 0.25 Applicatorfuls vaginally 3 (three) times a week.    Dispense:  30 g    Refill:  0   TOUJEO SOLOSTAR 300 UNIT/ML Solostar Pen    Sig: Inject 40 Units into the skin at bedtime.    Dispense:  3 mL    Refill:  3      Immunization History  Administered Date(s) Administered   Fluad Quad(high Dose 65+) 03/05/2019, 04/04/2020   Influenza, High Dose Seasonal PF 08/29/2017, 04/03/2018   Influenza-Unspecified 03/16/2013   PFIZER(Purple Top)SARS-COV-2 Vaccination 08/08/2019, 09/05/2019, 05/09/2020   PPD Test 02/08/2014, 03/09/2015, 10/24/2015, 06/19/2016, 09/28/2019   Pneumococcal Conjugate-13 08/29/2017   Pneumococcal Polysaccharide-23 07/17/2011, 07/31/2018   Td 07/17/2003   Tdap 12/10/2013    Diabetes Related Lab Review: Lab Results  Component Value Date   HGBA1C 7.2 (A) 02/03/2021   HGBA1C 8.9 (H) 10/12/2020   HGBA1C 6.7 (A) 07/13/2020    Lab Results  Component Value Date   MICROALBUR 1.0 10/11/2020   Lab Results  Component Value Date   CREATININE 0.69 10/11/2020   BUN 10 10/11/2020   NA 138 10/11/2020   K 4.1 10/11/2020   CL 100 10/11/2020   CO2 28 10/11/2020   Lab Results  Component Value Date   CHOL 161 10/11/2020   CHOL 177 09/16/2019   CHOL 141 07/31/2018   Lab Results  Component Value  Date   HDL 66.90 10/11/2020   HDL 83.90 09/16/2019   HDL 71.90 07/31/2018   Lab Results  Component Value Date   LDLCALC 62 09/16/2019   LDLCALC 46 07/31/2018   LDLCALC 46 09/05/2017   Lab Results  Component Value Date   TRIG 253.0 (H) 10/11/2020   TRIG 155.0 (H) 09/16/2019   TRIG 113.0 07/31/2018   Lab Results  Component Value Date   CHOLHDL 2 10/11/2020   CHOLHDL 2 09/16/2019   CHOLHDL 2 07/31/2018   Lab Results  Component Value Date   LDLDIRECT 61.0 10/11/2020   The 10-year ASCVD risk score Denman George DC Jr., et al.,  2013) is: 12.7%   Values used to calculate the score:     Age: 67 years     Sex: Female     Is Non-Hispanic African American: No     Diabetic: Yes     Tobacco smoker: No     Systolic Blood Pressure: 111 mmHg     Is BP treated: Yes     HDL Cholesterol: 66.9 mg/dL     Total Cholesterol: 161 mg/dL I have reviewed the PMH, Fam and Soc history. Patient Active Problem List   Diagnosis Date Noted   Benign essential HTN 08/29/2017    Priority: High   Major depression, recurrent, chronic (HCC) 08/29/2017    Priority: High    Had been on zoloft for years: changed to paxil 2018 with good results. Added wellbutrin 07/2018 with good results     Type 2 diabetes mellitus without complications (HCC) 03/09/2015    Priority: High   Memory loss 03/08/2015    Priority: High   Anxiety     Priority: High   Mixed hyperlipidemia     Priority: High   Hypothyroidism     Priority: High   Restless leg syndrome 08/08/2018    Priority: Medium   Macular degeneration 07/31/2018    Priority: Medium    2020     S/P cervical discectomy 04/28/2018    Priority: Medium   Carpal tunnel syndrome on right 08/29/2016    Priority: Medium   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/2016    Priority: Medium   Dupuytren's contracture of left hand 12/21/2015    Priority: Medium   Dysphagia, pharyngoesophageal phase 12/29/2014    Priority: Medium    Nl EGD 2019     GERD (gastroesophageal reflux disease)     Priority: Medium   Seborrheic dermatitis of scalp 05/13/2018    Priority: Low   Eczema of both external ears 12/19/2017    Priority: Low   Vitamin D deficiency 03/03/2014    Priority: Low   Allergy     Priority: Low   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 She has never used smokeless tobacco. She reports that she does not drink alcohol and does not use drugs.  Review of Systems: Ophthalmic: negative for eye pain, loss  of vision or double vision Cardiovascular: negative for chest pain Respiratory: negative for SOB or persistent cough Gastrointestinal: negative for abdominal pain Genitourinary: negative for dysuria or gross hematuria MSK: negative for foot lesions Neurologic: negative for weakness or gait disturbance  Objective  Vitals: BP 111/70   Pulse 84   Temp 97.6 F (36.4 C) (Temporal)   Ht 4\' 11"  (1.499 m)   Wt 157 lb 6.4 oz (71.4 kg)   SpO2 96%   BMI 31.79 kg/m  General: well appearing, no acute distress  Psych:  Alert and oriented, normal mood and affect Cor: RRR Lung: CTA  Diabetic education: ongoing education regarding chronic disease management for diabetes was given today. We continue to reinforce the ABC's of diabetic management: A1c (<7 or 8 dependent upon patient), tight blood pressure control, and cholesterol management with goal LDL < 100 minimally. We discuss diet strategies, exercise recommendations, medication options and possible side effects. At each visit, we review recommended immunizations and preventive care recommendations for diabetics and stress that good diabetic control can prevent other problems. See below for this patient's data.   Commons side effects, risks, benefits, and alternatives for medications and treatment plan prescribed today were discussed, and the patient expressed understanding of the given instructions. Patient is instructed to call or message via MyChart if he/she has any questions or concerns regarding our treatment plan. No barriers to understanding were identified. We discussed Red Flag symptoms and signs in detail. Patient expressed understanding regarding what to do in case of urgent or emergency type symptoms.  Medication list was reconciled, printed and provided to the patient in AVS. Patient instructions and summary information was reviewed with the patient as documented in the AVS. This note was prepared with assistance of Dragon voice  recognition software. Occasional wrong-word or sound-a-like substitutions may have occurred due to the inherent limitations of voice recognition software  This visit occurred during the SARS-CoV-2 public health emergency.  Safety protocols were in place, including screening questions prior to the visit, additional usage of staff PPE, and extensive cleaning of exam room while observing appropriate contact time as indicated for disinfecting solutions.

## 2021-02-05 MED ORDER — TOUJEO SOLOSTAR 300 UNIT/ML ~~LOC~~ SOPN: 40.0000 [IU] | PEN_INJECTOR | Freq: Every day | SUBCUTANEOUS | 3 refills | Status: DC

## 2021-02-06 ENCOUNTER — Other Ambulatory Visit: Payer: Self-pay | Admitting: Family Medicine

## 2021-03-08 ENCOUNTER — Other Ambulatory Visit: Payer: Self-pay | Admitting: Family Medicine

## 2021-04-18 ENCOUNTER — Other Ambulatory Visit: Payer: Medicare HMO

## 2021-04-18 ENCOUNTER — Ambulatory Visit: Payer: Medicare HMO

## 2021-04-21 ENCOUNTER — Telehealth: Payer: Self-pay

## 2021-04-21 ENCOUNTER — Other Ambulatory Visit: Payer: Self-pay

## 2021-04-21 ENCOUNTER — Ambulatory Visit: Payer: Medicare HMO

## 2021-04-21 DIAGNOSIS — F418 Other specified anxiety disorders: Secondary | ICD-10-CM

## 2021-04-21 NOTE — Progress Notes (Signed)
Last Refill 09/21/2020 Last OV 02/03/2021 dx diabetes

## 2021-04-21 NOTE — Telephone Encounter (Signed)
LAST APPOINTMENT DATE:  02/03/21  NEXT APPOINTMENT DATE: 05/08/21  MEDICATION: clonazePAM (KLONOPIN) 0.5 MG tablet  PHARMACY: [facility], [address] [place], [place], [place] [address]

## 2021-04-21 NOTE — Telephone Encounter (Signed)
Rx request sent to PCP. 

## 2021-04-24 MED ORDER — CLONAZEPAM 0.5 MG PO TABS: ORAL_TABLET | ORAL | 5 refills | Status: DC

## 2021-05-08 ENCOUNTER — Ambulatory Visit: Payer: Medicare HMO | Admitting: Family Medicine

## 2021-05-15 ENCOUNTER — Encounter: Payer: Self-pay | Admitting: Family Medicine

## 2021-05-16 DIAGNOSIS — M79671 Pain in right foot: Secondary | ICD-10-CM | POA: Diagnosis not present

## 2021-05-16 DIAGNOSIS — M5136 Other intervertebral disc degeneration, lumbar region: Secondary | ICD-10-CM | POA: Diagnosis not present

## 2021-05-16 DIAGNOSIS — M47896 Other spondylosis, lumbar region: Secondary | ICD-10-CM | POA: Diagnosis not present

## 2021-05-16 DIAGNOSIS — M545 Low back pain, unspecified: Secondary | ICD-10-CM | POA: Diagnosis not present

## 2021-05-16 DIAGNOSIS — M25532 Pain in left wrist: Secondary | ICD-10-CM | POA: Diagnosis not present

## 2021-05-16 DIAGNOSIS — M4326 Fusion of spine, lumbar region: Secondary | ICD-10-CM | POA: Diagnosis not present

## 2021-05-29 ENCOUNTER — Telehealth: Payer: Self-pay

## 2021-05-29 MED ORDER — GABAPENTIN 600 MG PO TABS: 1200.0000 mg | ORAL_TABLET | Freq: Three times a day (TID) | ORAL | 0 refills | Status: DC

## 2021-05-29 MED ORDER — GABAPENTIN 600 MG PO TABS: 1200.0000 mg | ORAL_TABLET | Freq: Three times a day (TID) | ORAL | 1 refills | Status: DC

## 2021-05-29 NOTE — Addendum Note (Signed)
Addended by: Katharine Look on: 05/29/2021 01:40 PM   Modules accepted: Orders

## 2021-05-29 NOTE — Telephone Encounter (Signed)
MEDICATION: gabapentin (NEURONTIN) 600 MG tablet  [facility]  [address] [place] [address] Phone: [phone]  Comments:   **Let patient know to contact pharmacy at the end of the day to make sure medication is ready. **  ** Please notify patient to allow 48-72 hours to process**  **Encourage patient to contact the pharmacy for refills or they can request refills through [facility]**

## 2021-05-29 NOTE — Addendum Note (Signed)
Addended by: Asencion Partridge on: 05/29/2021 04:46 PM   Modules accepted: Orders

## 2021-05-29 NOTE — Telephone Encounter (Signed)
Please advise, will not let me refill medication

## 2021-06-07 ENCOUNTER — Other Ambulatory Visit: Payer: Self-pay | Admitting: Family Medicine

## 2021-06-14 DIAGNOSIS — M654 Radial styloid tenosynovitis [de Quervain]: Secondary | ICD-10-CM | POA: Diagnosis not present

## 2021-06-14 DIAGNOSIS — G5601 Carpal tunnel syndrome, right upper limb: Secondary | ICD-10-CM | POA: Diagnosis not present

## 2021-06-14 DIAGNOSIS — M1811 Unilateral primary osteoarthritis of first carpometacarpal joint, right hand: Secondary | ICD-10-CM | POA: Diagnosis not present

## 2021-06-14 DIAGNOSIS — M65351 Trigger finger, right little finger: Secondary | ICD-10-CM | POA: Diagnosis not present

## 2021-06-21 ENCOUNTER — Ambulatory Visit: Payer: Medicare HMO | Admitting: Family Medicine

## 2021-07-05 ENCOUNTER — Ambulatory Visit: Payer: Medicare HMO | Admitting: Family Medicine

## 2021-07-10 ENCOUNTER — Other Ambulatory Visit: Payer: Self-pay | Admitting: Family Medicine

## 2021-07-10 DIAGNOSIS — K219 Gastro-esophageal reflux disease without esophagitis: Secondary | ICD-10-CM

## 2021-07-21 ENCOUNTER — Ambulatory Visit: Payer: Medicare HMO | Admitting: Family Medicine

## 2021-08-26 ENCOUNTER — Other Ambulatory Visit: Payer: Self-pay | Admitting: Family Medicine

## 2021-08-29 ENCOUNTER — Ambulatory Visit: Payer: Medicare HMO | Admitting: Gastroenterology

## 2021-09-21 ENCOUNTER — Ambulatory Visit: Payer: Medicare HMO | Admitting: Gastroenterology

## 2021-09-25 ENCOUNTER — Inpatient Hospital Stay: Admission: RE | Admit: 2021-09-25 | Payer: Medicare HMO | Source: Ambulatory Visit

## 2021-10-02 ENCOUNTER — Ambulatory Visit (INDEPENDENT_AMBULATORY_CARE_PROVIDER_SITE_OTHER): Payer: Medicare HMO | Admitting: Family Medicine

## 2021-10-02 ENCOUNTER — Encounter: Payer: Self-pay | Admitting: Family Medicine

## 2021-10-02 VITALS — BP 101/65 | HR 85 | Temp 97.4°F | Ht 59.0 in | Wt 156.8 lb

## 2021-10-02 DIAGNOSIS — E039 Hypothyroidism, unspecified: Secondary | ICD-10-CM | POA: Diagnosis not present

## 2021-10-02 DIAGNOSIS — E782 Mixed hyperlipidemia: Secondary | ICD-10-CM | POA: Diagnosis not present

## 2021-10-02 DIAGNOSIS — F418 Other specified anxiety disorders: Secondary | ICD-10-CM

## 2021-10-02 DIAGNOSIS — I10 Essential (primary) hypertension: Secondary | ICD-10-CM

## 2021-10-02 DIAGNOSIS — E1159 Type 2 diabetes mellitus with other circulatory complications: Secondary | ICD-10-CM

## 2021-10-02 DIAGNOSIS — Z794 Long term (current) use of insulin: Secondary | ICD-10-CM

## 2021-10-02 DIAGNOSIS — E119 Type 2 diabetes mellitus without complications: Secondary | ICD-10-CM

## 2021-10-02 DIAGNOSIS — I152 Hypertension secondary to endocrine disorders: Secondary | ICD-10-CM

## 2021-10-02 LAB — CBC WITH DIFFERENTIAL/PLATELET
Basophils Absolute: 0 10*3/uL (ref 0.0–0.1)
Basophils Relative: 0.6 % (ref 0.0–3.0)
Eosinophils Absolute: 0 10*3/uL (ref 0.0–0.7)
Eosinophils Relative: 1 % (ref 0.0–5.0)
HCT: 38.6 % (ref 36.0–46.0)
Hemoglobin: 12.8 g/dL (ref 12.0–15.0)
Lymphocytes Relative: 26.5 % (ref 12.0–46.0)
Lymphs Abs: 1.2 10*3/uL (ref 0.7–4.0)
MCHC: 33.1 g/dL (ref 30.0–36.0)
MCV: 83.2 fl (ref 78.0–100.0)
Monocytes Absolute: 0.4 10*3/uL (ref 0.1–1.0)
Monocytes Relative: 7.7 % (ref 3.0–12.0)
Neutro Abs: 3 10*3/uL (ref 1.4–7.7)
Neutrophils Relative %: 64.2 % (ref 43.0–77.0)
Platelets: 191 10*3/uL (ref 150.0–400.0)
RBC: 4.64 Mil/uL (ref 3.87–5.11)
RDW: 17.2 % — ABNORMAL HIGH (ref 11.5–15.5)
WBC: 4.6 10*3/uL (ref 4.0–10.5)

## 2021-10-02 LAB — LIPID PANEL
Cholesterol: 142 mg/dL (ref 0–200)
HDL: 70.8 mg/dL (ref >39.00)
LDL Cholesterol: 35 mg/dL (ref 0–99)
NonHDL: 70.9
Total CHOL/HDL Ratio: 2
Triglycerides: 179 mg/dL — ABNORMAL HIGH (ref 0.0–149.0)
VLDL: 35.8 mg/dL (ref 0.0–40.0)

## 2021-10-02 LAB — COMPREHENSIVE METABOLIC PANEL
ALT: 20 U/L (ref 0–35)
AST: 18 U/L (ref 0–37)
Albumin: 3.9 g/dL (ref 3.5–5.2)
Alkaline Phosphatase: 98 U/L (ref 39–117)
BUN: 13 mg/dL (ref 6–23)
CO2: 23 mEq/L (ref 19–32)
Calcium: 8.6 mg/dL (ref 8.4–10.5)
Chloride: 103 mEq/L (ref 96–112)
Creatinine, Ser: 0.68 mg/dL (ref 0.40–1.20)
GFR: 88.84 mL/min (ref >60.00)
Glucose, Bld: 244 mg/dL — ABNORMAL HIGH (ref 70–99)
Potassium: 4 mEq/L (ref 3.5–5.1)
Sodium: 137 mEq/L (ref 135–145)
Total Bilirubin: 0.3 mg/dL (ref 0.2–1.2)
Total Protein: 6.3 g/dL (ref 6.0–8.3)

## 2021-10-02 LAB — TSH: TSH: 0.07 u[IU]/mL — ABNORMAL LOW (ref 0.35–5.50)

## 2021-10-02 LAB — HEMOGLOBIN A1C: Hgb A1c MFr Bld: 9 % — ABNORMAL HIGH (ref 4.6–6.5)

## 2021-10-02 MED ORDER — CLONAZEPAM 0.5 MG PO TABS: ORAL_TABLET | ORAL | 5 refills | Status: DC

## 2021-10-02 NOTE — Patient Instructions (Signed)
Please return in 3 months for your annual complete physical; please come fasting.  ? ?I will release your lab results to you on your MyChart account with further instructions. You may see the results before I do, but when I review them I will send you a message with my report or have my assistant call you if things need to be discussed. Please reply to my message with any questions. Thank you!  ? ?We want your fasting sugars to be < 130. Please increase your toujeo to 45 units nightly for one week; if your fastings remain > 130, can increase by 3 units weekly until your fastings are improving.  ?Continue metformin and jardiance.  ?If your sugars remain high in 3 months, we can consider adding another medication.  ?Eat better. See below for a diabetic diet.  ? ?If you have any questions or concerns, please don't hesitate to send me a message via MyChart or call the office at [phone]. Thank you for visiting with [place] today! It's our pleasure caring for you. ? ?Diabetes Mellitus and Nutrition, Adult ?When you have diabetes, or diabetes mellitus, it is very important to have healthy eating habits because your blood sugar (glucose) levels are greatly affected by what you eat and drink. Eating healthy foods in the right amounts, at about the same times every day, can help you: ?Manage your blood glucose. ?Lower your risk of heart disease. ?Improve your blood pressure. ?Reach or maintain a healthy weight. ?What can affect my meal plan? ?Every person with diabetes is different, and each person has different needs for a meal plan. Your health care provider may recommend that you work with a dietitian to make a meal plan that is best for you. Your meal plan may vary depending on factors such as: ?The calories you need. ?The medicines you take. ?Your weight. ?Your blood glucose, blood pressure, and cholesterol levels. ?Your activity level. ?Other health conditions you have, such as heart or kidney disease. ?How do  carbohydrates affect me? ?Carbohydrates, also called carbs, affect your blood glucose level more than any other type of food. Eating carbs raises the amount of glucose in your blood. ?It is important to know how many carbs you can safely have in each meal. This is different for every person. Your dietitian can help you calculate how many carbs you should have at each meal and for each snack. ?How does alcohol affect me? ?Alcohol can cause a decrease in blood glucose (hypoglycemia), especially if you use insulin or take certain diabetes medicines by mouth. Hypoglycemia can be a life-threatening condition. Symptoms of hypoglycemia, such as sleepiness, dizziness, and confusion, are similar to symptoms of having too much alcohol. ?Do not drink alcohol if: ?Your health care provider tells you not to drink. ?You are pregnant, may be pregnant, or are planning to become pregnant. ?If you drink alcohol: ?Limit how much you have to: ?0-1 drink a day for women. ?0-2 drinks a day for men. ?Know how much alcohol is in your drink. In the [place] one drink equals one 12 oz bottle of beer (355 mL), one 5 oz glass of wine (148 mL), or one 1? oz glass of hard liquor (44 mL). ?Keep yourself hydrated with water, diet soda, or unsweetened iced tea. Keep in mind that regular soda, juice, and other mixers may contain a lot of sugar and must be counted as carbs. ?What are tips for following this plan? ?Reading food labels ?Start by checking the serving size on  the Nutrition Facts label of packaged foods and drinks. The number of calories and the amount of carbs, fats, and other nutrients listed on the label are based on one serving of the item. Many items contain more than one serving per package. ?Check the total grams (g) of carbs in one serving. ?Check the number of grams of saturated fats and trans fats in one serving. Choose foods that have a low amount or none of these fats. ?Check the number of milligrams (mg) of salt (sodium) in one  serving. Most people should limit total sodium intake to less than 2,300 mg per day. ?Always check the nutrition information of foods labeled as "low-fat" or "nonfat." These foods may be higher in added sugar or refined carbs and should be avoided. ?Talk to your dietitian to identify your daily goals for nutrients listed on the label. ?Shopping ?Avoid buying canned, pre-made, or processed foods. These foods tend to be high in fat, sodium, and added sugar. ?Shop around the outside edge of the grocery store. This is where you will most often find fresh fruits and vegetables, bulk grains, fresh meats, and fresh dairy products. ?Cooking ?Use low-heat cooking methods, such as baking, instead of high-heat cooking methods, such as deep frying. ?Cook using healthy oils, such as olive, canola, or sunflower oil. ?Avoid cooking with butter, cream, or high-fat meats. ?Meal planning ?Eat meals and snacks regularly, preferably at the same times every day. Avoid going long periods of time without eating. ?Eat foods that are high in fiber, such as fresh fruits, vegetables, beans, and whole grains. ?Eat 4-6 oz (112-168 g) of lean protein each day, such as lean meat, chicken, fish, eggs, or tofu. One ounce (oz) (28 g) of lean protein is equal to: ?1 oz (28 g) of meat, chicken, or fish. ?1 egg. ?? cup (62 g) of tofu. ?Eat some foods each day that contain healthy fats, such as avocado, nuts, seeds, and fish. ?What foods should I eat? ?Fruits ?Berries. Apples. Oranges. Peaches. Apricots. Plums. Grapes. Mangoes. Papayas. Pomegranates. Kiwi. Cherries. ?Vegetables ?Leafy greens, including lettuce, spinach, kale, chard, collard greens, mustard greens, and cabbage. Beets. Cauliflower. Broccoli. Carrots. Green beans. Tomatoes. Peppers. Onions. Cucumbers. Brussels sprouts. ?Grains ?Whole grains, such as whole-wheat or whole-grain bread, crackers, tortillas, cereal, and pasta. Unsweetened oatmeal. Quinoa. Brown or wild rice. ?Meats and other  proteins ?Seafood. Poultry without skin. Lean cuts of poultry and beef. Tofu. Nuts. Seeds. ?Dairy ?Low-fat or fat-free dairy products such as milk, yogurt, and cheese. ?The items listed above may not be a complete list of foods and beverages you can eat and drink. Contact a dietitian for more information. ?What foods should I avoid? ?Fruits ?Fruits canned with syrup. ?Vegetables ?Canned vegetables. Frozen vegetables with butter or cream sauce. ?Grains ?Refined white flour and flour products such as bread, pasta, snack foods, and cereals. Avoid all processed foods. ?Meats and other proteins ?Fatty cuts of meat. Poultry with skin. Breaded or fried meats. Processed meat. Avoid saturated fats. ?Dairy ?Full-fat yogurt, cheese, or milk. ?Beverages ?Sweetened drinks, such as soda or iced tea. ?The items listed above may not be a complete list of foods and beverages you should avoid. Contact a dietitian for more information. ?Questions to ask a health care provider ?Do I need to meet with a certified diabetes care and education specialist? ?Do I need to meet with a dietitian? ?What number can I call if I have questions? ?When are the best times to check my blood glucose? ?Where to  find more information: ?American Diabetes Association: diabetes.org ?Academy of Nutrition and Dietetics: eatright.org ?[employer] of Diabetes and Digestive and Kidney Diseases: AmenCredit.is ?Association of Diabetes Care & Education Specialists: diabeteseducator.org ?Summary ?It is important to have healthy eating habits because your blood sugar (glucose) levels are greatly affected by what you eat and drink. It is important to use alcohol carefully. ?A healthy meal plan will help you manage your blood glucose and lower your risk of heart disease. ?Your health care provider may recommend that you work with a dietitian to make a meal plan that is best for you. ?This information is not intended to replace advice given to you by your health  care provider. Make sure you discuss any questions you have with your health care provider. ?Document Revised: 02/03/2020 Document Reviewed: 02/03/2020 ?Elsevier Patient Education ? [place]. ?  ?

## 2021-10-02 NOTE — Progress Notes (Signed)
? ?Subjective  ?CC:  ?Chief Complaint  ?Patient presents with  ? Diabetes  ?  She is not fasting. She has no concerns. She would like refill on Klonopin.   ? Hypertension  ? Hypothyroidism  ? Gastroesophageal Reflux  ? Hyperlipidemia  ? ? ?HPI: Marissa Thompson is a 71 y.o. female who presents to the office today for follow up of diabetes and problems listed above in the chief complaint.  ?Diabetes follow up: Her diabetic control is reported as Worse. Fastings as high as 200s. On jardiance and toujeo 40 or less. On met bid. Poor diet.  She denies exertional CP or SOB or symptomatic hypoglycemia. She denies foot sores or paresthesias.  ?HTN and HLD: due for lipid recheck on crestor 20,. Bp has been controlled. No cp or sob ?Low thyroid dur for recheck. Reports feels controlled.  ?Mood: improving. Retired. Still on klonpin bid.  ?Overdue for cpe.  ? ?Wt Readings from Last 3 Encounters:  ?10/02/21 156 lb 12.8 oz (71.1 kg)  ?02/03/21 157 lb 6.4 oz (71.4 kg)  ?10/11/20 157 lb 6.4 oz (71.4 kg)  ? ? ?BP Readings from Last 3 Encounters:  ?10/02/21 101/65  ?02/03/21 111/70  ?11/24/20 (!) 123/106  ? ? ?Assessment  ?1. Type 2 diabetes mellitus without complication, with long-term current use of insulin ([place])   ?2. Hypertension associated with diabetes ([place])   ?3. Acquired hypothyroidism   ?4. Mixed hyperlipidemia   ? ?  ?Plan  ?Diabetes is currently  likley uncontrolled. Discuss nutrition and diabetic care education again. Increase toujueo to 45 and titrate up. Continue jardiance and met. May need ozempic. Need regular f/u. Recommend bringing in log.  ?HTN: well controlled.  ?Recheck lipid and cmp on statin.  ?Recheck thyroid levels  ?Pt has diabetic eye exam scheduled.  ? ?Follow up: 3 mo for cpe and dm. ?Orders Placed This Encounter  ?Procedures  ? CBC with Differential/Platelet  ? Comprehensive metabolic panel  ? Lipid panel  ? TSH  ? Hemoglobin A1c  ? POCT HgB A1C  ? ?No orders of the defined types were placed in this  encounter. ? ?  ? ?Immunization History  ?Administered Date(s) Administered  ? Fluad Quad(high Dose 65+) 03/05/2019, 04/04/2020  ? Influenza, High Dose Seasonal PF 08/29/2017, 04/03/2018  ? Influenza-Unspecified 03/16/2013  ? PFIZER(Purple Top)SARS-COV-2 Vaccination 08/08/2019, 09/05/2019, 05/09/2020, 02/16/2021  ? PPD Test 02/08/2014, 03/09/2015, 10/24/2015, 06/19/2016, 09/28/2019  ? Pneumococcal Conjugate-13 08/29/2017  ? Pneumococcal Polysaccharide-23 07/17/2011, 07/31/2018  ? Td 07/17/2003  ? Tdap 12/10/2013  ? ? ?Diabetes Related Lab Review: ?Lab Results  ?Component Value Date  ? HGBA1C 7.2 (A) 02/03/2021  ? HGBA1C 8.9 (H) 10/12/2020  ? HGBA1C 6.7 (A) 07/13/2020  ?  ?Lab Results  ?Component Value Date  ? MICROALBUR 1.0 10/11/2020  ? ?Lab Results  ?Component Value Date  ? CREATININE 0.69 10/11/2020  ? BUN 10 10/11/2020  ? NA 138 10/11/2020  ? K 4.1 10/11/2020  ? CL 100 10/11/2020  ? CO2 28 10/11/2020  ? ?Lab Results  ?Component Value Date  ? CHOL 161 10/11/2020  ? CHOL 177 09/16/2019  ? CHOL 141 07/31/2018  ? ?Lab Results  ?Component Value Date  ? HDL 66.90 10/11/2020  ? HDL 83.90 09/16/2019  ? HDL 71.90 07/31/2018  ? ?Lab Results  ?Component Value Date  ? [place] 62 09/16/2019  ? LDLCALC 46 07/31/2018  ? LDLCALC 46 09/05/2017  ? ?Lab Results  ?Component Value Date  ? TRIG 253.0 (H) 10/11/2020  ?  TRIG 155.0 (H) 09/16/2019  ? TRIG 113.0 07/31/2018  ? ?Lab Results  ?Component Value Date  ? CHOLHDL 2 10/11/2020  ? CHOLHDL 2 09/16/2019  ? CHOLHDL 2 07/31/2018  ? ?Lab Results  ?Component Value Date  ? LDLDIRECT 61.0 10/11/2020  ? ?The 10-year ASCVD risk score (Arnett DK, et al., 2019) is: 12% ?  Values used to calculate the score: ?    Age: 7 years ?    Sex: Female ?    Is Non-Hispanic African American: No ?    Diabetic: Yes ?    Tobacco smoker: No ?    Systolic Blood Pressure: 824 mmHg ?    Is BP treated: Yes ?    HDL Cholesterol: 66.9 mg/dL ?    Total Cholesterol: 161 mg/dL ?I have reviewed the [place]  history. ?Patient Active Problem List  ? Diagnosis Date Noted  ? Benign essential HTN 08/29/2017  ?  Priority: High  ? Major depression, recurrent, chronic ([place]) 08/29/2017  ?  Priority: High  ?  Had been on zoloft for years: changed to paxil 2018 with good results. Added wellbutrin 07/2018 with good results ?  ? Type 2 diabetes mellitus without complications ([place]) 23/53/6144  ?  Priority: High  ? Memory loss 03/08/2015  ?  Priority: High  ? Hypertension associated with diabetes ([place])   ?  Priority: High  ? Mixed hyperlipidemia   ?  Priority: High  ? Hypothyroidism   ?  Priority: High  ? Restless leg syndrome 08/08/2018  ?  Priority: Medium   ? Macular degeneration 07/31/2018  ?  Priority: Medium   ?  2020 ?  ? S/P cervical discectomy 04/28/2018  ?  Priority: Medium   ? Carpal tunnel syndrome on right 08/29/2016  ?  Priority: Medium   ? 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/2016  ?  Priority: Medium   ? Dupuytren's contracture of left hand 12/21/2015  ?  Priority: Medium   ? Dysphagia, pharyngoesophageal phase 12/29/2014  ?  Priority: Medium   ?  Nl EGD 2019 ?  ? GERD (gastroesophageal reflux disease)   ?  Priority: Medium   ? Seborrheic dermatitis of scalp 05/13/2018  ?  Priority: Low  ? Eczema of both external ears 12/19/2017  ?  Priority: Low  ? Vitamin D deficiency 03/03/2014  ?  Priority: Low  ? Allergy   ?  Priority: Low  ? 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  ? History of alcoholism ([place]) 04/28/2018  ? ? ?Social History: ?Patient  reports that she has never smoked. She has never used smokeless tobacco. She reports that she does not drink alcohol and does not use drugs. ? ?Review of Systems: ?Ophthalmic: negative for eye pain, loss of vision or double vision ?Cardiovascular: negative for chest pain ?Respiratory: negative for SOB or persistent cough ?Gastrointestinal: negative for abdominal pain ?Genitourinary: negative for dysuria or gross hematuria ?MSK: negative for foot lesions ?Neurologic: negative for weakness or gait  disturbance ? ?Objective  ?Vitals: BP 101/65   Pulse 85   Temp (!) 97.4 ?F (36.3 ?C) (Temporal)   Ht 4' 11"  (1.499 m)   Wt 156 lb 12.8 oz (71.1 kg)   SpO2 97%   BMI 31.67 kg/m?  ?General: well appearing, no acute distress  ?Psych:  Alert and oriented, normal mood and affect ?HEENT:  Normocephalic, atraumatic, moist mucous membranes, supple neck  ?Cardiovascular:  Nl S1 and S2, RRR without murmur, gallop or rub. no edema ?Respiratory:  Good breath sounds  bilaterally, CTAB with normal effort, no rales ? ? ? ? ?Diabetic education: ongoing education regarding chronic disease management for diabetes was given today. We continue to reinforce the ABC's of diabetic management: A1c (<7 or 8 dependent upon patient), tight blood pressure control, and cholesterol management with goal LDL < 100 minimally. We discuss diet strategies, exercise recommendations, medication options and possible side effects. At each visit, we review recommended immunizations and preventive care recommendations for diabetics and stress that good diabetic control can prevent other problems. See below for this patient's data. ? ? ?Commons side effects, risks, benefits, and alternatives for medications and treatment plan prescribed today were discussed, and the patient expressed understanding of the given instructions. Patient is instructed to call or message via MyChart if he/she has any questions or concerns regarding our treatment plan. No barriers to understanding were identified. We discussed Red Flag symptoms and signs in detail. Patient expressed understanding regarding what to do in case of urgent or emergency type symptoms.  ?Medication list was reconciled, printed and provided to the patient in AVS. Patient instructions and summary information was reviewed with the patient as documented in the AVS. ?This note was prepared with assistance of Systems analyst. Occasional wrong-word or sound-a-like substitutions may have  occurred due to the inherent limitations of voice recognition software ? ?This visit occurred during the SARS-CoV-2 public health emergency.  Safety protocols were in place, including screening questions prior to the visit,

## 2021-10-03 ENCOUNTER — Ambulatory Visit: Payer: Medicare HMO | Admitting: Gastroenterology

## 2021-10-03 ENCOUNTER — Ambulatory Visit: Payer: Medicare HMO | Admitting: Physician Assistant

## 2021-10-03 NOTE — Progress Notes (Deleted)
? ? ? ?10/03/2021 ?Marissa Thompson ?[id number] ?25-Mar-1951 ? ? ?ASSESSMENT AND PLAN:  ?*** ?There are no diagnoses linked to this encounter. ? ? ?Patient Care Team: ?Willow Ora, MD as PCP - General (Family Medicine) ?Patricia Nettle, MD as Consulting Physician (Orthopedic Surgery) ?Dahlia Byes, [facility] as Pharmacist (Pharmacist) ?Lavone Nian, PA-C as Orthopedic Physician Assistant - Certified (Physician Assistant) ? ?HISTORY OF PRESENT ILLNESS: ?71 y.o. female referred by Willow Ora, MD, with a past medical history of hypertension, insulin dependent diabetes, hyperlipidemia, hypothyroidism,GERD with history of esophageal stricture requiring dilatation 2016, personal history of colon cancer resected 2007 and others listed below presents for evaluation of dysphagia.  ?Patient does have history of C3-C7 ACDF 2019. ?Known to Dr. Lavon Paganini.  ?07/25/2017 colonoscopy and endoscopy for dysphagia ?Colonoscopy due to personal history of colon cancer had poor bowel prep, supposed to have repeat colonoscopy at next available appointment with extended bowel prep. ?Endoscopy for dysphagia showed normal stomach, normal duodenum, no reasons to explain dysphagia ?06/2015 MBS showed mild pharyngeal phase dysphagia, mild sensorimotor pharyngeal phase dysphagia due to delayed sensation decreased laryngeal elevation.  Episodes of flash penetration and trace amounts anterior wall of laryngeal vestibule.  Suspected ACDF surgery could possibly be primary indicator of dysphagia.  Moderate aspiration risk ?2016 endoscopy for dysphagia with esophageal stricture dilated to 18 mm ?2013  colonoscopy at [facility] ? ?External labs and notes reviewed this visit: ?CBC  10/02/2021  ?HGB 12.8 MCV 83.2 without evidence of anemia ?WBC 4.6 Platelets 191.0 ?Kidney function 10/02/2021  ?BUN 13 Cr 0.68   ?Potassium 4.0   ?LFTs 10/02/2021  ?AST 18 ALT 20 ?Alkphos 98 TBili 0.3 ?10/02/2021 TSH 0.07 ? ? ?Current Medications:  ? ?Current Outpatient  Medications (Endocrine & Metabolic):  ?  empagliflozin (JARDIANCE) 25 MG TABS tablet, Take 1 tablet (25 mg total) by mouth daily. ?  levothyroxine (SYNTHROID) 112 MCG tablet, TAKE 1 TABLET BY MOUTH EVERY DAY ?  metFORMIN (GLUCOPHAGE) 1000 MG tablet, TAKE 1 TABLET BY MOUTH TWICE A DAY WITH MEALS ?  TOUJEO SOLOSTAR 300 UNIT/ML Solostar Pen, Inject 40 Units into the skin at bedtime. ? ?Current Outpatient Medications (Cardiovascular):  ?  lisinopril (PRINIVIL,ZESTRIL) 5 MG tablet, Take 0.5 tablets (2.5 mg total) by mouth daily. ?  pravastatin (PRAVACHOL) 40 MG tablet, Take 1 tablet (40 mg total) by mouth at bedtime. ? ? ?Current Outpatient Medications (Analgesics):  ?  aspirin EC 81 MG tablet, Take 81 mg by mouth daily. ?  ibuprofen (ADVIL,MOTRIN) 200 MG tablet, Take 800 mg by mouth every 4 (four) hours as needed for fever, headache, moderate pain or cramping. ? ? ?Current Outpatient Medications (Other):  ?  citalopram (CELEXA) 20 MG tablet, TAKE 1 TABLET BY MOUTH EVERY DAY ?  clonazePAM (KLONOPIN) 0.5 MG tablet, TAKE 1 TABLET BY MOUTH TWICE A DAY AS NEEDED FOR ANXIETY ?  cyclobenzaprine (FLEXERIL) 10 MG tablet, Take 1 tablet (10 mg total) by mouth 3 (three) times daily as needed for muscle spasms. ?  gabapentin (NEURONTIN) 600 MG tablet, Take 2 tablets (1,200 mg total) by mouth 3 (three) times daily. ?  Insulin Pen Needle (CAREFINE PEN NEEDLES) 32G X 4 MM MISC, Use as directed (Patient not taking: Reported on 10/02/2021) ?  Insulin Pen Needle (FIFTY50 PEN NEEDLES) 32G X 4 MM MISC, Use as directed (Patient not taking: Reported on 10/02/2021) ?  MELATONIN PO, Take by mouth. ?  Multiple Vitamins-Minerals (PRESERVISION AREDS 2 PO), Take 1 tablet by mouth 2 (two) times  daily. ?  pantoprazole (PROTONIX) 40 MG tablet, TAKE 1 TABLET BY MOUTH EVERY DAY ?  PREMARIN vaginal cream, PLACE 0.25 APPLICATORFULS VAGINALLY 3 (THREE) TIMES A WEEK. (Patient not taking: Reported on 10/02/2021) ? ?Medical History:  ?Past Medical History:   ?Diagnosis Date  ? Allergy   ? Anxiety 2003  ? Cough   ? for over a year per pt-  ? Depression 1998  ? Diabetes mellitus without complication (HCC) 1998  ? GERD (gastroesophageal reflux disease) 1980  ? HLD (hyperlipidemia)   ? Hyperlipidemia 1990  ? Hypothyroidism 1998  ? Macular degeneration 07/31/2018  ? 2020  ? Macular degeneration 12/2019  ? PONV (postoperative nausea and vomiting)   ? 1 time per pt in 2001  ? SOB (shortness of breath) on exertion   ? ?Allergies:  ?Allergies  ?Allergen Reactions  ? Toradol [Ketorolac Tromethamine] Other (See Comments)  ?  Slurred speech, confusion  ? Baclofen Other (See Comments)  ?  Memory Loss and shakes  ? Morphine And Related Itching  ? Penicillins Rash  ?  Has patient had a PCN reaction causing immediate rash, facial/tongue/throat swelling, SOB or lightheadedness with hypotension: Yes ?Has patient had a PCN reaction causing severe rash involving mucus membranes or skin necrosis: No ?Has patient had a PCN reaction that required hospitalization: No ?Has patient had a PCN reaction occurring within the last 10 years: No ?If all of the above answers are "NO", then may proceed with Cephalosporin use. ?  ? Talwin [Pentazocine] Itching  ?  ? ?Surgical History:  ?She  has a past surgical history that includes Tonsillectomy; Ectopic pregnancy surgery; Back surgery; Hysteroscopy with D & C (N/A, 08/17/2013); Cervical polypectomy (N/A, 08/17/2013); Neck surgery; Carpal tunnel release (Left, 03/08/2016); Trigger finger release (Left, 03/08/2016); Colon surgery (2007); Upper gastrointestinal endoscopy; Esophageal dilation; Anterior cervical discectomy (02/2018); Eye surgery; and Cataract extraction (6/21-7/21). ?Family History:  ?Her family history includes Alcohol abuse in her brother and father; Arthritis in her father; Cancer (age of onset: 69) in her father; Depression in her mother; Diabetes in her mother; Hypertension in her mother. ?Social History:  ? reports that she has never smoked.  She has never used smokeless tobacco. She reports that she does not drink alcohol and does not use drugs. ? ?REVIEW OF SYSTEMS  : All other systems reviewed and negative except where noted in the History of Present Illness. ? ? ?PHYSICAL EXAM: ?There were no vitals taken for this visit. ?General:   Pleasant, well developed female in no acute distress ?Head:  Normocephalic and atraumatic. ?Eyes: {sclerae:26738},conjunctive {conjuctiva:26739}  ?Heart:  {HEART EXAM HEM/ONC:21750} ?Pulm: Clear anteriorly; no wheezing ?Abdomen:  {BlankSingle:19197::"Distended","Ridged","Soft"}, {BlankSingle:19197::"Flat","Obese"} AB, skin exam {ABDOMEN SKIN EXAM:22649}, {BlankSingle:19197::"Absent","Hyperactive, tinkling","Hypoactive","Sluggish","Normal"} bowel sounds. {Desc; pc desc - abdomen tenderness:5168} tenderness {anatomy; site abdomen:5010}. {BlankMultiple:19196::"Without guarding","With guarding","Without rebound","With rebound"}, {Exam; abdomen organomegaly:15152}. ?Extremities:  {With/Without:304960234} edema. ?Msk:  Symmetrical without gross deformities. Peripheral pulses intact.  ?Neurologic:  Alert and  oriented x4;  grossly normal neurologically. ?Skin:   Dry and intact without significant lesions or rashes. ?Psychiatric: Demonstrates good judgement and reason without abnormal affect or behaviors. ? ? ?Doree Albee, PA-C ?12:24 PM ? ? ?

## 2021-10-11 ENCOUNTER — Telehealth: Payer: Self-pay | Admitting: Family Medicine

## 2021-10-11 ENCOUNTER — Other Ambulatory Visit: Payer: Self-pay

## 2021-10-11 ENCOUNTER — Other Ambulatory Visit: Payer: Self-pay | Admitting: Family Medicine

## 2021-10-11 MED ORDER — LEVOTHYROXINE SODIUM 100 MCG PO TABS: 100.0000 ug | ORAL_TABLET | Freq: Every day | ORAL | 3 refills | Status: DC

## 2021-10-11 NOTE — Telephone Encounter (Signed)
Returning a call and asking for a call back ?

## 2021-10-11 NOTE — Telephone Encounter (Signed)
Please see lab result message.  

## 2021-10-11 NOTE — Telephone Encounter (Signed)
Please call patient back- returning your call.  ?

## 2021-10-15 ENCOUNTER — Other Ambulatory Visit: Payer: Self-pay | Admitting: Family Medicine

## 2021-10-30 ENCOUNTER — Other Ambulatory Visit: Payer: Self-pay | Admitting: Family Medicine

## 2021-11-07 ENCOUNTER — Other Ambulatory Visit: Payer: Self-pay | Admitting: Family Medicine

## 2021-11-07 DIAGNOSIS — E119 Type 2 diabetes mellitus without complications: Secondary | ICD-10-CM

## 2021-11-08 DIAGNOSIS — H353132 Nonexudative age-related macular degeneration, bilateral, intermediate dry stage: Secondary | ICD-10-CM | POA: Diagnosis not present

## 2021-11-08 DIAGNOSIS — H26491 Other secondary cataract, right eye: Secondary | ICD-10-CM | POA: Diagnosis not present

## 2021-11-08 DIAGNOSIS — E119 Type 2 diabetes mellitus without complications: Secondary | ICD-10-CM | POA: Diagnosis not present

## 2021-11-08 DIAGNOSIS — H52203 Unspecified astigmatism, bilateral: Secondary | ICD-10-CM | POA: Diagnosis not present

## 2021-11-15 ENCOUNTER — Encounter: Payer: Medicare HMO | Admitting: Family Medicine

## 2021-11-22 ENCOUNTER — Encounter: Payer: Self-pay | Admitting: Podiatry

## 2021-11-22 ENCOUNTER — Ambulatory Visit (INDEPENDENT_AMBULATORY_CARE_PROVIDER_SITE_OTHER): Payer: Medicare HMO

## 2021-11-22 ENCOUNTER — Ambulatory Visit (INDEPENDENT_AMBULATORY_CARE_PROVIDER_SITE_OTHER): Payer: Medicare HMO | Admitting: Podiatry

## 2021-11-22 DIAGNOSIS — M2010 Hallux valgus (acquired), unspecified foot: Secondary | ICD-10-CM

## 2021-11-22 DIAGNOSIS — M2011 Hallux valgus (acquired), right foot: Secondary | ICD-10-CM | POA: Diagnosis not present

## 2021-11-22 DIAGNOSIS — Q66221 Congenital metatarsus adductus, right foot: Secondary | ICD-10-CM

## 2021-11-22 DIAGNOSIS — M2012 Hallux valgus (acquired), left foot: Secondary | ICD-10-CM

## 2021-11-22 DIAGNOSIS — Q66222 Congenital metatarsus adductus, left foot: Secondary | ICD-10-CM

## 2021-11-22 NOTE — H&P (View-Only) (Signed)
? ?Subjective: 71 y.o. female presents today for new complaint regarding pain and tenderness associated to a long history of bunion deformity.  Patient states that she has developed bunions ever since she was a young child and they have been symptomatic ever since she was a child.  She states that she was never in a position to have them corrected for surgically and they only progressively became worse over time.  She states that she is getting ready to retire and is finally in a position to have her bunions corrected for.  She experiences constant pain and tenderness especially to the left foot despite different shoe gear modifications throughout the years and arch supports.  She presents for further treatment and evaluation ? ? ?Past Medical History:  ?Diagnosis Date  ? Allergy   ? Anxiety 2003  ? Cough   ? for over a year per pt-  ? Depression 1998  ? Diabetes mellitus without complication (HCC) 1998  ? GERD (gastroesophageal reflux disease) 1980  ? HLD (hyperlipidemia)   ? Hyperlipidemia 1990  ? Hypothyroidism 1998  ? Macular degeneration 07/31/2018  ? 2020  ? Macular degeneration 12/2019  ? PONV (postoperative nausea and vomiting)   ? 1 time per pt in 2001  ? SOB (shortness of breath) on exertion   ? ?Past Surgical History:  ?Procedure Laterality Date  ? ANTERIOR CERVICAL DISCECTOMY  02/2018  ? BACK SURGERY    ? x3 Lumbar  ? CARPAL TUNNEL RELEASE Left 03/08/2016  ? Procedure: LEFT CARPAL TUNNEL RELEASE;  Surgeon: Kevin Kuzma, MD;  Location: [facility];  Service: Orthopedics;  Laterality: Left;  ? CATARACT EXTRACTION  6/21-7/21  ? CERVICAL POLYPECTOMY N/A 08/17/2013  ? Procedure: CERVICAL POLYPECTOMY;  Surgeon: Richard D Kaplan, MD;  Location: [facility];  Service: Gynecology;  Laterality: N/A;  ? COLON SURGERY  2007  ? benign mass  ? ECTOPIC PREGNANCY SURGERY    ? ESOPHAGEAL DILATION    ? x3  ? EYE SURGERY    ? HYSTEROSCOPY WITH D & C N/A 08/17/2013  ? Procedure: DILATATION AND CURETTAGE /HYSTEROSCOPY;   Surgeon: Richard D Kaplan, MD;  Location: [facility];  Service: Gynecology;  Laterality: N/A;  YAG LASER  ? NECK SURGERY    ? c5-7 ACDF 3/15/has screws and plate in neck  ? TONSILLECTOMY    ? TRIGGER FINGER RELEASE Left 03/08/2016  ? Procedure: LEFT LONG TRIGGER RELEASE AND LEFT RING TRIGGER RELEASE;  Surgeon: Kevin Kuzma, MD;  Location: [facility];  Service: Orthopedics;  Laterality: Left;  ? UPPER GASTROINTESTINAL ENDOSCOPY    ? ?Allergies  ?Allergen Reactions  ? Toradol [Ketorolac Tromethamine] Other (See Comments)  ?  Slurred speech, confusion  ? Baclofen Other (See Comments)  ?  Memory Loss and shakes  ? Morphine And Related Itching  ? Penicillins Rash  ?  Has patient had a PCN reaction causing immediate rash, facial/tongue/throat swelling, SOB or lightheadedness with hypotension: Yes ?Has patient had a PCN reaction causing severe rash involving mucus membranes or skin necrosis: No ?Has patient had a PCN reaction that required hospitalization: No ?Has patient had a PCN reaction occurring within the last 10 years: No ?If all of the above answers are "NO", then may proceed with Cephalosporin use. ?  ? Talwin [Pentazocine] Itching  ? ?Objective: ?Physical Exam ?General: The patient is alert and oriented x3 in no acute distress. ? ?Dermatology: Skin is cool, dry and supple bilateral lower extremities. Negative for open lesions or   macerations. ? ?Vascular: Palpable pedal pulses bilaterally. No edema or erythema noted. Capillary refill within normal limits. ? ?Neurological: Epicritic and protective threshold grossly intact bilaterally.  ? ?Musculoskeletal Exam: Clinical evidence of bunion deformity noted to the respective foot. There is moderate pain on palpation range of motion of the first MPJ. Lateral deviation of the hallux noted consistent with hallux abductovalgus. ?To the left foot there is crossover deformity of the second and third digit overlapping the hallux. ? ?Radiographic Exam: Increased  intermetatarsal angle greater than 15? with a hallux abductus angle greater than 30? noted on AP view. Moderate degenerative changes noted within the first MPJ. ?Metatarsus adductus is also noted with medial deviation of the lesser metatarsals also noted ? ?Assessment: ?1. HAV w/ bunion deformity bilateral. LT is most symptomatic.  ?2.  Metatarsus adductus bilateral ? ? ?Plan of Care:  ?1. Patient was evaluated. X-Rays reviewed and explained to the patient. ?2. Today we discussed the conservative versus surgical management of the presenting pathology.  I also explained in detail the Lapidus bunionectomy procedure as well as the need to address for the metatarsus adductus to allow for better correction and reduction of the IM angle and a more rectus foot alignment postoperatively.  The patient opts for surgical management. All possible complications and details of the procedure were explained. All patient questions were answered. No guarantees were expressed or implied. ?3. Authorization for surgery was initiated today. Surgery will consist of Lapiplasty Lapidus type bunionectomy left. Adductoplasty tarsometatarsal arthrodesis two, three left.  ?4.  Patient states that she does live alone however she has friends and family nearby who can come and stay with her postoperatively.  Postoperative course was explained in detail explaining that she cannot weight-bear or put pressure on the foot for about 1 month postoperatively.  Patient understands and states that she will have plenty of help postoperatively at home ?5.  Patient is also diabetic.  Last A1c 9.0 on 10/02/2021.  We cannot proceed with surgery until A1c levels are within an acceptable surgical range less than 8.0.  Patient will require medical clearance from PCP prior to surgery ?6.  Return to clinic 1 week postop ? ? ? ?Clarence Cogswell M. Mansur Patti Levee, DPM ?[facility] ? ?Dr. Armend Hochstatter M. Lyanne Kates, DPM  ?  ?[address]                                         ?[place], [place] [address]                ?Office [phone]  ?Fax [phone] ? ? ? ? ? ?

## 2021-11-22 NOTE — Progress Notes (Signed)
? ?Subjective: 71 y.o. female presents today for new complaint regarding pain and tenderness associated to a long history of bunion deformity.  Patient states that she has developed bunions ever since she was a young child and they have been symptomatic ever since she was a child.  She states that she was never in a position to have them corrected for surgically and they only progressively became worse over time.  She states that she is getting ready to retire and is finally in a position to have her bunions corrected for.  She experiences constant pain and tenderness especially to the left foot despite different shoe gear modifications throughout the years and arch supports.  She presents for further treatment and evaluation ? ? ?Past Medical History:  ?Diagnosis Date  ? Allergy   ? Anxiety 2003  ? Cough   ? for over a year per pt-  ? Depression 1998  ? Diabetes mellitus without complication (HCC) 1998  ? GERD (gastroesophageal reflux disease) 1980  ? HLD (hyperlipidemia)   ? Hyperlipidemia 1990  ? Hypothyroidism 1998  ? Macular degeneration 07/31/2018  ? 2020  ? Macular degeneration 12/2019  ? PONV (postoperative nausea and vomiting)   ? 1 time per pt in 2001  ? SOB (shortness of breath) on exertion   ? ?Past Surgical History:  ?Procedure Laterality Date  ? ANTERIOR CERVICAL DISCECTOMY  02/2018  ? BACK SURGERY    ? x3 Lumbar  ? CARPAL TUNNEL RELEASE Left 03/08/2016  ? Procedure: LEFT CARPAL TUNNEL RELEASE;  Surgeon: Betha Loa, MD;  Location: [facility];  Service: Orthopedics;  Laterality: Left;  ? CATARACT EXTRACTION  6/21-7/21  ? CERVICAL POLYPECTOMY N/A 08/17/2013  ? Procedure: CERVICAL POLYPECTOMY;  Surgeon: Mickel Baas, MD;  Location: [facility];  Service: Gynecology;  Laterality: N/A;  ? COLON SURGERY  2007  ? benign mass  ? ECTOPIC PREGNANCY SURGERY    ? ESOPHAGEAL DILATION    ? x3  ? EYE SURGERY    ? HYSTEROSCOPY WITH D & C N/A 08/17/2013  ? Procedure: DILATATION AND CURETTAGE /HYSTEROSCOPY;   Surgeon: Mickel Baas, MD;  Location: [facility];  Service: Gynecology;  Laterality: N/A;  YAG LASER  ? NECK SURGERY    ? c5-7 ACDF 3/15/has screws and plate in neck  ? TONSILLECTOMY    ? TRIGGER FINGER RELEASE Left 03/08/2016  ? Procedure: LEFT LONG TRIGGER RELEASE AND LEFT RING TRIGGER RELEASE;  Surgeon: Betha Loa, MD;  Location: [facility];  Service: Orthopedics;  Laterality: Left;  ? UPPER GASTROINTESTINAL ENDOSCOPY    ? ?Allergies  ?Allergen Reactions  ? Toradol [Ketorolac Tromethamine] Other (See Comments)  ?  Slurred speech, confusion  ? Baclofen Other (See Comments)  ?  Memory Loss and shakes  ? Morphine And Related Itching  ? Penicillins Rash  ?  Has patient had a PCN reaction causing immediate rash, facial/tongue/throat swelling, SOB or lightheadedness with hypotension: Yes ?Has patient had a PCN reaction causing severe rash involving mucus membranes or skin necrosis: No ?Has patient had a PCN reaction that required hospitalization: No ?Has patient had a PCN reaction occurring within the last 10 years: No ?If all of the above answers are "NO", then may proceed with Cephalosporin use. ?  ? Talwin [Pentazocine] Itching  ? ?Objective: ?Physical Exam ?General: The patient is alert and oriented x3 in no acute distress. ? ?Dermatology: Skin is cool, dry and supple bilateral lower extremities. Negative for open lesions or  macerations. ? ?Vascular: Palpable pedal pulses bilaterally. No edema or erythema noted. Capillary refill within normal limits. ? ?Neurological: Epicritic and protective threshold grossly intact bilaterally.  ? ?Musculoskeletal Exam: Clinical evidence of bunion deformity noted to the respective foot. There is moderate pain on palpation range of motion of the first MPJ. Lateral deviation of the hallux noted consistent with hallux abductovalgus. ?To the left foot there is crossover deformity of the second and third digit overlapping the hallux. ? ?Radiographic Exam: Increased  intermetatarsal angle greater than 15? with a hallux abductus angle greater than 30? noted on AP view. Moderate degenerative changes noted within the first MPJ. ?Metatarsus adductus is also noted with medial deviation of the lesser metatarsals also noted ? ?Assessment: ?1. HAV w/ bunion deformity bilateral. LT is most symptomatic.  ?2.  Metatarsus adductus bilateral ? ? ?Plan of Care:  ?1. Patient was evaluated. X-Rays reviewed and explained to the patient. ?2. Today we discussed the conservative versus surgical management of the presenting pathology.  I also explained in detail the Lapidus bunionectomy procedure as well as the need to address for the metatarsus adductus to allow for better correction and reduction of the IM angle and a more rectus foot alignment postoperatively.  The patient opts for surgical management. All possible complications and details of the procedure were explained. All patient questions were answered. No guarantees were expressed or implied. ?3. Authorization for surgery was initiated today. Surgery will consist of Lapiplasty Lapidus type bunionectomy left. Adductoplasty tarsometatarsal arthrodesis two, three left.  ?4.  Patient states that she does live alone however she has friends and family nearby who can come and stay with her postoperatively.  Postoperative course was explained in detail explaining that she cannot weight-bear or put pressure on the foot for about 1 month postoperatively.  Patient understands and states that she will have plenty of help postoperatively at home ?5.  Patient is also diabetic.  Last A1c 9.0 on 10/02/2021.  We cannot proceed with surgery until A1c levels are within an acceptable surgical range less than 8.0.  Patient will require medical clearance from PCP prior to surgery ?6.  Return to clinic 1 week postop ? ? ? ?Felecia Shelling, DPM ?[facility] ? ?Dr. Felecia Shelling, DPM  ?  ?[address]                                         ?[place], [place] [address]                ?Office [phone]  ?Fax [phone] ? ? ? ? ? ?

## 2021-11-23 ENCOUNTER — Encounter: Payer: Self-pay | Admitting: Podiatry

## 2021-11-27 ENCOUNTER — Telehealth: Payer: Self-pay

## 2021-11-27 NOTE — Telephone Encounter (Signed)
DOS 12/21/2021 ? ?LAPIDUS PROCEDURE INC BUNIONECTOMY LT - 30865 ?ARTHRODESIS LIS [facility] LT - 28740 ? ?AETNA EFFECTIVE DATE - 11/13/2021 ? ?PLAN DEDUCTIBLE - WAIVED ?OUT OF POCKET - $5500.00 W/$5480.00 REMAINING ?COPAY $250.00 ?COINSURANCE - 100% ? ?BENEFIT CALL REF# [id number] ? ?SPOKE TO SARA AT [facility] STATED NO PRECERT IS REQUIRED FOR CPT [id number] & [phone], CALL UVO#[id number] ? ? ? ?

## 2021-12-06 ENCOUNTER — Ambulatory Visit (INDEPENDENT_AMBULATORY_CARE_PROVIDER_SITE_OTHER): Payer: Medicare HMO | Admitting: Family Medicine

## 2021-12-06 ENCOUNTER — Encounter: Payer: Self-pay | Admitting: Family Medicine

## 2021-12-06 VITALS — BP 116/76 | HR 81 | Temp 98.3°F | Ht 59.0 in | Wt 162.0 lb

## 2021-12-06 DIAGNOSIS — I10 Essential (primary) hypertension: Secondary | ICD-10-CM | POA: Diagnosis not present

## 2021-12-06 DIAGNOSIS — Z1231 Encounter for screening mammogram for malignant neoplasm of breast: Secondary | ICD-10-CM

## 2021-12-06 DIAGNOSIS — R69 Illness, unspecified: Secondary | ICD-10-CM | POA: Diagnosis not present

## 2021-12-06 DIAGNOSIS — F339 Major depressive disorder, recurrent, unspecified: Secondary | ICD-10-CM

## 2021-12-06 DIAGNOSIS — Z01818 Encounter for other preprocedural examination: Secondary | ICD-10-CM | POA: Diagnosis not present

## 2021-12-06 DIAGNOSIS — E039 Hypothyroidism, unspecified: Secondary | ICD-10-CM | POA: Diagnosis not present

## 2021-12-06 DIAGNOSIS — Z794 Long term (current) use of insulin: Secondary | ICD-10-CM | POA: Diagnosis not present

## 2021-12-06 DIAGNOSIS — E119 Type 2 diabetes mellitus without complications: Secondary | ICD-10-CM

## 2021-12-06 DIAGNOSIS — F1021 Alcohol dependence, in remission: Secondary | ICD-10-CM

## 2021-12-06 LAB — HEMOGLOBIN A1C: Hgb A1c MFr Bld: 7.6 % — ABNORMAL HIGH (ref 4.6–6.5)

## 2021-12-06 LAB — MICROALBUMIN / CREATININE URINE RATIO
Creatinine,U: 21.4 mg/dL
Microalb Creat Ratio: 3.3 mg/g (ref 0.0–30.0)
Microalb, Ur: <0.7 mg/dL (ref 0.0–1.9)

## 2021-12-06 LAB — TSH: TSH: 0.56 u[IU]/mL (ref 0.35–5.50)

## 2021-12-06 MED ORDER — METFORMIN HCL 1000 MG PO TABS: 1000.0000 mg | ORAL_TABLET | Freq: Two times a day (BID) | ORAL | 3 refills | Status: DC

## 2021-12-06 NOTE — Progress Notes (Signed)
Subjective  CC:  Chief Complaint  Patient presents with   ringing in the head    Pt stated that she has some humming noise in here head x3 weeks that is constant and also a red spot on her left calf.    HPI: Marissa Thompson is a 71 y.o. female who presents to the office today for follow up of diabetes and problems listed above in the chief complaint.  Diabetes follow up: Her diabetic control is reported as Improved..  She is taking about 35 units nightly.  Reports her fasting readings between 80 and 120.  She does not check postprandial.  She is feeling much better.  She continues on Jardiance and metformin. She denies exertional CP or SOB or symptomatic hypoglycemia. She denies foot sores or paresthesias.  Bunion: She is scheduled for foot surgery but needs medical preop clearance.  Diabetes has been uncontrolled.  She does not have heart disease or lung disease. Hypothyroidism: We reduced her levothyroxine to 100 mcg from 112.  She is asymptomatic.  Due for recheck. Her mood is well controlled.  Stressors are less in her new home.  Now living in [place]. Reports a humming noise ongoing mainly in the back of her head with some neck pain.  No true tinnitus.  No neurologic symptoms.  Has chronic neck pain. Health maintenance: Overdue for mammogram.  Would like to get in [place] if possible. Wt Readings from Last 3 Encounters:  12/06/21 162 lb (73.5 kg)  10/02/21 156 lb 12.8 oz (71.1 kg)  02/03/21 157 lb 6.4 oz (71.4 kg)    BP Readings from Last 3 Encounters:  12/06/21 116/76  10/02/21 101/65  02/03/21 111/70    Assessment  1. Type 2 diabetes mellitus without complication, with long-term current use of insulin (HCC)   2. Acquired hypothyroidism   3. Essential hypertension   4. Preoperative clearance   5. History of alcoholism (HCC) Chronic  6. Major depression, recurrent, chronic (HCC) Chronic  7. Encounter for screening mammogram for breast cancer      Plan  Diabetes: We  will recheck A1c today.  Should be trending downwards.  Recommend deferring surgery until A1c is at least less than 8 but preferably less than 7.  Patient will continue checking her sugars.  Recommend postprandial checks.  Continue metformin, Toujeo and Jardiance.  Need to clarify if she still taking her lisinopril: Renal dose 2.5 daily.  From our records it looks like it has been out for a while. Recheck thyroid levels.  She reports she is compliant with levothyroxine 100 mcg daily Hypertension is controlled. No longer drinking Mood is good Refer for mammogram  Follow up: 3 months for recheck. Orders Placed This Encounter  Procedures   MM DIGITAL SCREENING BILATERAL   Hemoglobin A1c   TSH   Microalbumin / creatinine urine ratio   Meds ordered this encounter  Medications   metFORMIN (GLUCOPHAGE) 1000 MG tablet    Sig: Take 1 tablet (1,000 mg total) by mouth 2 (two) times daily with a meal.    Dispense:  180 tablet    Refill:  3      Immunization History  Administered Date(s) Administered   Fluad Quad(high Dose 65+) 03/05/2019, 04/04/2020   Influenza, High Dose Seasonal PF 08/29/2017, 04/03/2018   Influenza-Unspecified 03/16/2013   PFIZER(Purple Top)SARS-COV-2 Vaccination 08/08/2019, 09/05/2019, 05/09/2020, 02/16/2021   PPD Test 02/08/2014, 03/09/2015, 10/24/2015, 06/19/2016, 09/28/2019   Pneumococcal Conjugate-13 08/29/2017   Pneumococcal Polysaccharide-23 07/17/2011, 07/31/2018  Td 07/17/2003   Tdap 12/10/2013    Diabetes Related Lab Review: Lab Results  Component Value Date   HGBA1C 9.0 (H) 10/02/2021   HGBA1C 7.2 (A) 02/03/2021   HGBA1C 8.9 (H) 10/12/2020    Lab Results  Component Value Date   MICROALBUR 1.0 10/11/2020   Lab Results  Component Value Date   CREATININE 0.68 10/02/2021   BUN 13 10/02/2021   NA 137 10/02/2021   K 4.0 10/02/2021   CL 103 10/02/2021   CO2 23 10/02/2021   Lab Results  Component Value Date   CHOL 142 10/02/2021   CHOL 161  10/11/2020   CHOL 177 09/16/2019   Lab Results  Component Value Date   HDL 70.80 10/02/2021   HDL 66.90 10/11/2020   HDL 83.90 09/16/2019   Lab Results  Component Value Date   LDLCALC 35 10/02/2021   LDLCALC 62 09/16/2019   LDLCALC 46 07/31/2018   Lab Results  Component Value Date   TRIG 179.0 (H) 10/02/2021   TRIG 253.0 (H) 10/11/2020   TRIG 155.0 (H) 09/16/2019   Lab Results  Component Value Date   CHOLHDL 2 10/02/2021   CHOLHDL 2 10/11/2020   CHOLHDL 2 09/16/2019   Lab Results  Component Value Date   LDLDIRECT 61.0 10/11/2020   The 10-year ASCVD risk score (Arnett DK, et al., 2019) is: 14.7%   Values used to calculate the score:     Age: [age] years     Sex: Female     Is Non-Hispanic African American: No     Diabetic: Yes     Tobacco smoker: No     Systolic Blood Pressure: 116 mmHg     Is BP treated: Yes     HDL Cholesterol: 70.8 mg/dL     Total Cholesterol: 142 mg/dL I have reviewed the PMH, Fam and Soc history. Patient Active Problem List   Diagnosis Date Noted   Benign essential HTN 08/29/2017    Priority: High   Major depression, recurrent, chronic (HCC) 08/29/2017    Priority: High    Had been on zoloft for years: changed to paxil 2018 with good results. 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 She has never used smokeless tobacco. She reports that she does not drink alcohol and does not use drugs.  Review of Systems: Ophthalmic: negative for eye pain, loss of vision or double vision Cardiovascular: negative for chest pain Respiratory: negative for SOB or persistent cough Gastrointestinal: negative for abdominal pain Genitourinary: negative for dysuria or gross hematuria MSK: negative for foot lesions Neurologic: negative for weakness or gait disturbance  Objective  Vitals: BP 116/76   Pulse 81   Temp 98.3 F (36.8 C)   Ht 4\' 11"  (1.499 m)   Wt 162 lb (73.5 kg)   SpO2 97%   BMI 32.72 kg/m  General: well appearing, no acute distress  Psych:  Alert and oriented, normal mood and affect HEENT:  Normocephalic, atraumatic, moist mucous membranes, supple neck, normal TMs bilaterally Cardiovascular:  Nl S1 and S2, RRR without murmur, gallop or rub. no edema Respiratory:  Good breath sounds bilaterally, CTAB with normal effort, no rales     Diabetic education: ongoing education regarding chronic disease management for diabetes was given today. We continue to reinforce the ABC's of diabetic management: A1c (<7 or 8 dependent upon patient), tight blood pressure control, and cholesterol management with goal LDL < 100 minimally. We discuss diet strategies, exercise recommendations, medication options and possible side effects. At each visit, we review recommended immunizations and preventive care  recommendations for diabetics and stress that good diabetic control can prevent other problems. See below for this patient's data.   Commons side effects, risks, benefits, and alternatives for medications and treatment plan prescribed today were discussed, and the patient expressed understanding of the given instructions. Patient is instructed to call or message via MyChart if he/she has any questions or concerns regarding our treatment plan. No barriers to understanding were identified. We discussed Red Flag symptoms and signs in detail. Patient expressed understanding regarding what to do in case of urgent or emergency type symptoms.  Medication list was reconciled, printed and provided to the patient in AVS. Patient instructions and summary information was reviewed with the patient as documented in the AVS. This note was prepared with assistance of Dragon voice recognition software. Occasional wrong-word or sound-a-like substitutions may have occurred due to the inherent limitations of voice recognition software  This visit occurred during the SARS-CoV-2 public health emergency.  Safety protocols were in place, including screening questions prior to the visit, additional usage of staff PPE, and extensive cleaning of exam room while observing appropriate contact time as indicated for disinfecting solutions.

## 2021-12-06 NOTE — Patient Instructions (Signed)
Please return in 3 months for diabetes follow up   Please check your medication bottles and let me know if you are taking your lisinopril daily.  I have refilled your metformin.   I will release your lab results to you on your MyChart account with further instructions. You may see the results before I do, but when I review them I will send you a message with my report or have my assistant call you if things need to be discussed. Please reply to my message with any questions. Thank you!   If you have any questions or concerns, please don't hesitate to send me a message via MyChart or call the office at [phone]. Thank you for visiting with Marissa Thompson today! It's our pleasure caring for you.

## 2021-12-07 ENCOUNTER — Telehealth: Payer: Self-pay | Admitting: Family Medicine

## 2021-12-07 NOTE — Telephone Encounter (Signed)
Pt states [facility] is needing surgery clearance from Dr Jonni Sanger. She states her surgery is scheduled for 6/8. Please advise

## 2021-12-07 NOTE — Progress Notes (Signed)
Great to hear. Thanks.  

## 2021-12-08 ENCOUNTER — Telehealth: Payer: Self-pay | Admitting: Family Medicine

## 2021-12-08 NOTE — Progress Notes (Signed)
Note, this should be fine.  Thank so much and have a great weekend! -Kipp Brood

## 2021-12-08 NOTE — Telephone Encounter (Signed)
Copied from CRM [id number]. Topic: Medicare AWV >> Dec 08, 2021 10:43 AM Harris-Coley, Avon Gully wrote: Reason for CRM: Left message for patient to schedule Annual Wellness Visit.  Please schedule with Nurse Health Advisor Lanier Ensign, RN at [facility].  Please call [phone] ask for [facility]

## 2021-12-08 NOTE — Telephone Encounter (Signed)
Spoke with pt to let her know that Dr. Amalia Hailey has everything that he needs for her surgery.

## 2021-12-18 ENCOUNTER — Inpatient Hospital Stay (HOSPITAL_COMMUNITY): Admission: RE | Admit: 2021-12-18 | Payer: Medicare HMO | Source: Ambulatory Visit

## 2021-12-20 ENCOUNTER — Encounter (HOSPITAL_COMMUNITY): Payer: Self-pay | Admitting: Podiatry

## 2021-12-20 ENCOUNTER — Other Ambulatory Visit: Payer: Self-pay

## 2021-12-20 NOTE — Progress Notes (Signed)
For Short Stay: COVID SWAB appointment date:  N/A Date of COVID positive in last 90 days: N/A  Bowel Prep reminder:  N/A   For Anesthesia: PCP - Willow Ora, MD last office visit note and pre op clearance 12/06/21 in epic Cardiologist - N/A  Chest x-ray -  N/A EKG -  N/A Stress Test -  greater than 2 years ago ECHO -  N/A Cardiac Cath -  Pacemaker/ICD device last checked: N/A Pacemaker orders received: N/A Device Rep notified: N/A  Spinal Cord Stimulator: N/A  Sleep Study - N/A CPAP - N/A  Fasting Blood Sugar - 85-130 Checks Blood Sugar _1____ times a day Date and result of last Hgb A1c-  Blood Thinner Instructions:  N/A Aspirin Instructions: ASA 81 mg Last Dose: 12/08/21  Activity level:  Able to exercise without chest pain and/or shortness of breath       Anesthesia review:  N/A  Patient denies shortness of breath, fever, cough and chest pain at PAT appointment   Patient verbalized understanding of instructions that were given to them at the PAT appointment. Patient was also instructed that they will need to review over the PAT instructions again at home before surgery.

## 2021-12-21 ENCOUNTER — Other Ambulatory Visit: Payer: Self-pay

## 2021-12-21 ENCOUNTER — Ambulatory Visit (HOSPITAL_COMMUNITY): Payer: Medicare HMO | Admitting: Anesthesiology

## 2021-12-21 ENCOUNTER — Encounter (HOSPITAL_COMMUNITY)
Admission: RE | Disposition: A | Discharge status: Home / Self Care | Payer: Self-pay | Source: Home / Self Care | Attending: Podiatry

## 2021-12-21 ENCOUNTER — Encounter (HOSPITAL_COMMUNITY): Payer: Self-pay | Admitting: Podiatry

## 2021-12-21 ENCOUNTER — Ambulatory Visit (HOSPITAL_COMMUNITY)
Admission: RE | Admit: 2021-12-21 | Discharge: 2021-12-21 | Disposition: A | Discharge status: Home / Self Care | Payer: Medicare HMO | Attending: Podiatry | Admitting: Podiatry

## 2021-12-21 ENCOUNTER — Ambulatory Visit (HOSPITAL_BASED_OUTPATIENT_CLINIC_OR_DEPARTMENT_OTHER): Payer: Medicare HMO | Admitting: Anesthesiology

## 2021-12-21 DIAGNOSIS — E119 Type 2 diabetes mellitus without complications: Secondary | ICD-10-CM | POA: Diagnosis not present

## 2021-12-21 DIAGNOSIS — M216X2 Other acquired deformities of left foot: Secondary | ICD-10-CM | POA: Diagnosis not present

## 2021-12-21 DIAGNOSIS — I10 Essential (primary) hypertension: Secondary | ICD-10-CM | POA: Diagnosis not present

## 2021-12-21 DIAGNOSIS — Q66222 Congenital metatarsus adductus, left foot: Secondary | ICD-10-CM | POA: Diagnosis not present

## 2021-12-21 DIAGNOSIS — M2042 Other hammer toe(s) (acquired), left foot: Secondary | ICD-10-CM | POA: Diagnosis not present

## 2021-12-21 DIAGNOSIS — M2012 Hallux valgus (acquired), left foot: Secondary | ICD-10-CM | POA: Insufficient documentation

## 2021-12-21 DIAGNOSIS — F1021 Alcohol dependence, in remission: Secondary | ICD-10-CM

## 2021-12-21 DIAGNOSIS — G8918 Other acute postprocedural pain: Secondary | ICD-10-CM | POA: Diagnosis not present

## 2021-12-21 HISTORY — PX: FOOT ARTHRODESIS: SHX1655

## 2021-12-21 HISTORY — DX: Alcohol dependence, in remission: F10.21

## 2021-12-21 HISTORY — DX: Other amnesia: R41.3

## 2021-12-21 HISTORY — PX: HALLUX VALGUS LAPIDUS: SHX6626

## 2021-12-21 HISTORY — DX: Essential (primary) hypertension: I10

## 2021-12-21 HISTORY — DX: Carpal tunnel syndrome, bilateral upper limbs: G56.03

## 2021-12-21 HISTORY — DX: Palmar fascial fibromatosis (dupuytren): M72.0

## 2021-12-21 LAB — GLUCOSE, CAPILLARY: Glucose-Capillary: 132 mg/dL — ABNORMAL HIGH (ref 70–99)

## 2021-12-21 LAB — COMPREHENSIVE METABOLIC PANEL
ALT: 16 U/L (ref 0–44)
AST: 22 U/L (ref 15–41)
Albumin: 4 g/dL (ref 3.5–5.0)
Alkaline Phosphatase: 101 U/L (ref 38–126)
Anion gap: 9 (ref 5–15)
BUN: 10 mg/dL (ref 8–23)
CO2: 26 mmol/L (ref 22–32)
Calcium: 9.1 mg/dL (ref 8.9–10.3)
Chloride: 108 mmol/L (ref 98–111)
Creatinine, Ser: 0.72 mg/dL (ref 0.44–1.00)
GFR, Estimated: >60 mL/min (ref >60)
Glucose, Bld: 127 mg/dL — ABNORMAL HIGH (ref 70–99)
Potassium: 3.8 mmol/L (ref 3.5–5.1)
Sodium: 143 mmol/L (ref 135–145)
Total Bilirubin: 0.6 mg/dL (ref 0.3–1.2)
Total Protein: 6.7 g/dL (ref 6.5–8.1)

## 2021-12-21 SURGERY — BUNIONECTOMY, LAPIDUS
Anesthesia: General | Site: Foot | Laterality: Left

## 2021-12-21 MED ORDER — MIDAZOLAM HCL 2 MG/2ML IJ SOLN
INTRAMUSCULAR | Status: AC
  Filled 2021-12-21: qty 2

## 2021-12-21 MED ORDER — OXYCODONE HCL 5 MG PO TABS: 5.0000 mg | ORAL_TABLET | Freq: Once | ORAL | Status: DC | PRN

## 2021-12-21 MED ORDER — PHENYLEPHRINE 80 MCG/ML (10ML) SYRINGE FOR IV PUSH (FOR BLOOD PRESSURE SUPPORT)
PREFILLED_SYRINGE | INTRAVENOUS | Status: AC
  Filled 2021-12-21: qty 10

## 2021-12-21 MED ORDER — DEXAMETHASONE SODIUM PHOSPHATE 10 MG/ML IJ SOLN
INTRAMUSCULAR | Status: DC | PRN
  Administered 2021-12-21: 4 mg via INTRAVENOUS

## 2021-12-21 MED ORDER — ONDANSETRON HCL 4 MG/2ML IJ SOLN
INTRAMUSCULAR | Status: AC
Start: 2021-12-21 — End: ?
  Filled 2021-12-21: qty 2

## 2021-12-21 MED ORDER — ONDANSETRON HCL 4 MG/2ML IJ SOLN
INTRAMUSCULAR | Status: DC | PRN
  Administered 2021-12-21: 4 mg via INTRAVENOUS

## 2021-12-21 MED ORDER — MIDAZOLAM HCL 2 MG/2ML IJ SOLN
INTRAMUSCULAR | Status: DC | PRN
  Administered 2021-12-21 (×2): 1 mg via INTRAVENOUS

## 2021-12-21 MED ORDER — LACTATED RINGERS IV SOLN: INTRAVENOUS | Status: DC

## 2021-12-21 MED ORDER — DEXAMETHASONE SODIUM PHOSPHATE 10 MG/ML IJ SOLN
INTRAMUSCULAR | Status: AC
  Filled 2021-12-21: qty 1

## 2021-12-21 MED ORDER — OXYCODONE-ACETAMINOPHEN 5-325 MG PO TABS: 1.0000 | ORAL_TABLET | ORAL | 0 refills | Status: DC | PRN

## 2021-12-21 MED ORDER — LIDOCAINE 2% (20 MG/ML) 5 ML SYRINGE
INTRAMUSCULAR | Status: DC | PRN
  Administered 2021-12-21: 100 mg via INTRAVENOUS

## 2021-12-21 MED ORDER — OXYCODONE HCL 5 MG/5ML PO SOLN: 5.0000 mg | Freq: Once | ORAL | Status: DC | PRN

## 2021-12-21 MED ORDER — CHLORHEXIDINE GLUCONATE 0.12 % MT SOLN
15.0000 mL | Freq: Once | OROMUCOSAL | Status: AC
  Administered 2021-12-21: 15 mL via OROMUCOSAL

## 2021-12-21 MED ORDER — FENTANYL CITRATE (PF) 100 MCG/2ML IJ SOLN
INTRAMUSCULAR | Status: AC
  Filled 2021-12-21: qty 2

## 2021-12-21 MED ORDER — FENTANYL CITRATE (PF) 100 MCG/2ML IJ SOLN
INTRAMUSCULAR | Status: DC | PRN
  Administered 2021-12-21 (×3): 50 ug via INTRAVENOUS

## 2021-12-21 MED ORDER — ORAL CARE MOUTH RINSE: 15.0000 mL | Freq: Once | OROMUCOSAL | Status: AC

## 2021-12-21 MED ORDER — VANCOMYCIN HCL IN DEXTROSE 1-5 GM/200ML-% IV SOLN
1000.0000 mg | INTRAVENOUS | Status: AC
  Administered 2021-12-21: 1000 mg via INTRAVENOUS
  Filled 2021-12-21: qty 200

## 2021-12-21 MED ORDER — ROPIVACAINE HCL 5 MG/ML IJ SOLN
INTRAMUSCULAR | Status: DC | PRN
  Administered 2021-12-21: 30 mL via PERINEURAL

## 2021-12-21 MED ORDER — PROPOFOL 10 MG/ML IV BOLUS
INTRAVENOUS | Status: AC
  Filled 2021-12-21: qty 20

## 2021-12-21 MED ORDER — LIDOCAINE HCL (PF) 2 % IJ SOLN
INTRAMUSCULAR | Status: AC
  Filled 2021-12-21: qty 5

## 2021-12-21 MED ORDER — DROPERIDOL 2.5 MG/ML IJ SOLN
INTRAMUSCULAR | Status: DC | PRN
  Administered 2021-12-21: .625 mg via INTRAVENOUS

## 2021-12-21 MED ORDER — PROPOFOL 10 MG/ML IV BOLUS
INTRAVENOUS | Status: DC | PRN
  Administered 2021-12-21: 50 mg via INTRAVENOUS
  Administered 2021-12-21: 150 mg via INTRAVENOUS

## 2021-12-21 MED ORDER — ACETAMINOPHEN 10 MG/ML IV SOLN: 1000.0000 mg | Freq: Once | INTRAVENOUS | Status: DC | PRN

## 2021-12-21 MED ORDER — 0.9 % SODIUM CHLORIDE (POUR BTL) OPTIME
TOPICAL | Status: DC | PRN
  Administered 2021-12-21: 1000 mL

## 2021-12-21 MED ORDER — ONDANSETRON HCL 4 MG/2ML IJ SOLN
4.0000 mg | Freq: Once | INTRAMUSCULAR | Status: DC | PRN
Start: 2021-12-21 — End: 2021-12-21

## 2021-12-21 MED ORDER — FENTANYL CITRATE PF 50 MCG/ML IJ SOSY: 25.0000 ug | PREFILLED_SYRINGE | INTRAMUSCULAR | Status: DC | PRN

## 2021-12-21 MED ORDER — PHENYLEPHRINE 80 MCG/ML (10ML) SYRINGE FOR IV PUSH (FOR BLOOD PRESSURE SUPPORT)
PREFILLED_SYRINGE | INTRAVENOUS | Status: DC | PRN
  Administered 2021-12-21: 80 ug via INTRAVENOUS

## 2021-12-21 SURGICAL SUPPLY — 66 items
BAG COUNTER SPONGE SURGICOUNT (BAG) IMPLANT
BLADE AVERAGE 25X9 (BLADE) ×1 IMPLANT
BLADE HEX COATED 2.75 (ELECTRODE) ×3 IMPLANT
BLADE SAW LAPIPLASTY 40X11 (BLADE) ×1 IMPLANT
BLADE SURG 15 STRL LF DISP TIS (BLADE) ×2 IMPLANT
BLADE SURG 15 STRL SS (BLADE) ×6
BNDG ELASTIC 3X5.8 VLCR STR LF (GAUZE/BANDAGES/DRESSINGS) ×3 IMPLANT
BNDG ELASTIC 4X5.8 VLCR STR LF (GAUZE/BANDAGES/DRESSINGS) IMPLANT
BNDG ESMARK 4X9 LF (GAUZE/BANDAGES/DRESSINGS) ×3 IMPLANT
BNDG GAUZE ELAST 4 BULKY (GAUZE/BANDAGES/DRESSINGS) ×3 IMPLANT
CHLORAPREP W/TINT 26 (MISCELLANEOUS) ×3 IMPLANT
COVER BACK TABLE 60X90IN (DRAPES) ×3 IMPLANT
CUFF TOURN SGL QUICK 18X4 (TOURNIQUET CUFF) IMPLANT
CUFF TOURN SGL QUICK 34 (TOURNIQUET CUFF)
CUFF TRNQT CYL 34X4X40X1 (TOURNIQUET CUFF) IMPLANT
DRAPE EXTREMITY T 121X128X90 (DISPOSABLE) ×3 IMPLANT
DRAPE IMP U-DRAPE 54X76 (DRAPES) ×2 IMPLANT
DRAPE OEC MINIVIEW 54X84 (DRAPES) ×1 IMPLANT
DRAPE U-SHAPE 47X51 STRL (DRAPES) ×3 IMPLANT
DRSG EMULSION OIL 3X3 NADH (GAUZE/BANDAGES/DRESSINGS) ×3 IMPLANT
DRSG PAD ABDOMINAL 8X10 ST (GAUZE/BANDAGES/DRESSINGS) IMPLANT
ELECT REM PT RETURN 15FT ADLT (MISCELLANEOUS) ×3 IMPLANT
GAUZE 4X4 16PLY ~~LOC~~+RFID DBL (SPONGE) ×1 IMPLANT
GAUZE SPONGE 4X4 12PLY STRL (GAUZE/BANDAGES/DRESSINGS) ×3 IMPLANT
GAUZE XEROFORM 1X8 LF (GAUZE/BANDAGES/DRESSINGS) ×1 IMPLANT
GLOVE BIO SURGEON STRL SZ8 (GLOVE) ×3 IMPLANT
GLOVE BIOGEL PI IND STRL 8 (GLOVE) ×2 IMPLANT
GLOVE BIOGEL PI INDICATOR 8 (GLOVE) ×1
GOWN L4 XXLG W/PAP TWL (GOWN DISPOSABLE) ×3 IMPLANT
IMPL LAPIPLASTY LESSER TMT FIX (Orthopedic Implant) ×2 IMPLANT
INST GUIDED SPEEDRELEASE (INSTRUMENTS) ×3
INST TRITOME TRIPLE EDGE REL (INSTRUMENTS) ×3
INSTRUMENT GUIDED SPEEDRELEASE (INSTRUMENTS) IMPLANT
INSTRUMENT TRITOM TRPL EDG REL (INSTRUMENTS) IMPLANT
KIT BASIN OR (CUSTOM PROCEDURE TRAY) ×3 IMPLANT
KIT TURNOVER KIT A (KITS) ×1 IMPLANT
LAPIPLASTY SYSTEM 2 (Orthopedic Implant) ×3 IMPLANT
NDL HYPO 25X1 1.5 SAFETY (NEEDLE) ×2 IMPLANT
NEEDLE HYPO 25X1 1.5 SAFETY (NEEDLE) ×3 IMPLANT
NS IRRIG 1000ML POUR BTL (IV SOLUTION) IMPLANT
PADDING CAST ABS 4INX4YD NS (CAST SUPPLIES) ×1
PADDING CAST ABS COTTON 4X4 ST (CAST SUPPLIES) ×2 IMPLANT
PENCIL SMOKE EVACUATOR (MISCELLANEOUS) ×3 IMPLANT
PUTTY DBM STAGRAFT PLUS 5CC (Putty) ×1 IMPLANT
SCREW 2.7 HIGH PITCH LOCKING (Screw) ×2 IMPLANT
SLEEVE SCD COMPRESS KNEE MED (STOCKING) ×3 IMPLANT
STOCKINETTE 4X48 STRL (DRAPES) ×3 IMPLANT
STOCKINETTE 6  STRL (DRAPES) ×3
STOCKINETTE 6 STRL (DRAPES) ×2 IMPLANT
SUCTION FRAZIER HANDLE 10FR (MISCELLANEOUS) ×3
SUCTION TUBE FRAZIER 10FR DISP (MISCELLANEOUS) IMPLANT
SUT ETHILON 4 0 PS 2 18 (SUTURE) IMPLANT
SUT MNCRL AB 3-0 PS2 18 (SUTURE) IMPLANT
SUT MNCRL AB 4-0 PS2 18 (SUTURE) IMPLANT
SUT MON AB 5-0 PS2 18 (SUTURE) IMPLANT
SUT PROLENE 4 0 PS 2 18 (SUTURE) ×3 IMPLANT
SUT VIC AB 3-0 FS2 27 (SUTURE) ×3 IMPLANT
SUT VICRYL 4-0 PS2 18IN ABS (SUTURE) ×3 IMPLANT
SYR BULB EAR ULCER 3OZ GRN STR (SYRINGE) ×3 IMPLANT
SYR CONTROL 10ML LL (SYRINGE) ×3 IMPLANT
SYSTEM LAPIPLASTY 2 (Orthopedic Implant) IMPLANT
TOWEL OR 17X26 10 PK STRL BLUE (TOWEL DISPOSABLE) ×3 IMPLANT
TriTome Triple-edge release instrument ×1 IMPLANT
UNDERPAD 30X36 HEAVY ABSORB (UNDERPADS AND DIAPERS) ×3 IMPLANT
YANKAUER SUCT BULB TIP 10FT TU (MISCELLANEOUS) ×3 IMPLANT
YANKAUER SUCT BULB TIP NO VENT (SUCTIONS) ×3 IMPLANT

## 2021-12-21 NOTE — Discharge Instructions (Signed)
Non-weight bearing surgical extremity. Keep dressings clean and dry. Ice behind knee 67min/hr for the first 48 hours. Elevate leg.

## 2021-12-21 NOTE — Anesthesia Postprocedure Evaluation (Signed)
Anesthesia Post Note  Patient: Marissa Thompson  Procedure(s) Performed: HALLUX VALGUS LAPIDUS (Left) ARTHRODESIS LIS FRANC (Left: Foot)     Patient location during evaluation: PACU Anesthesia Type: General Level of consciousness: awake and alert Pain management: pain level controlled Vital Signs Assessment: post-procedure vital signs reviewed and stable Respiratory status: spontaneous breathing, nonlabored ventilation, respiratory function stable and patient connected to nasal cannula oxygen Cardiovascular status: blood pressure returned to baseline and stable Postop Assessment: no apparent nausea or vomiting Anesthetic complications: no   No notable events documented.  Last Vitals:  Vitals:   12/21/21 1220 12/21/21 1245  BP: (!) 114/57 106/61  Pulse: 89 87  Resp: 13 14  Temp: 36.7 C   SpO2: 97% 99%    Last Pain:  Vitals:   12/21/21 1245  TempSrc:   PainSc: 0-No pain                 Navada Osterhout S

## 2021-12-21 NOTE — Interval H&P Note (Signed)
History and Physical Interval Note:  12/21/2021 7:17 AM  Marissa Thompson  has presented today for surgery, with the diagnosis of HALLUX VALGUS,METATARSIS ADDECTUS LEFT FOOT.  The various methods of treatment have been discussed with the patient and family. After consideration of risks, benefits and other options for treatment, the patient has consented to  Procedure(s): HALLUX VALGUS LAPIDUS (Left) ARTHRODESIS LIS FRANC (Left) as a surgical intervention.  The patient's history has been reviewed, patient examined, no change in status, stable for surgery.  I have reviewed the patient's chart and labs.  Questions were answered to the patient's satisfaction.     Felecia Shelling

## 2021-12-21 NOTE — Anesthesia Procedure Notes (Signed)
Procedure Name: LMA Insertion Date/Time: 12/21/2021 9:21 AM  Performed by: Lollie Sails, CRNAPre-anesthesia Checklist: Patient identified, Emergency Drugs available, Suction available, Patient being monitored and Timeout performed Patient Re-evaluated:Patient Re-evaluated prior to induction Oxygen Delivery Method: Circle system utilized Preoxygenation: Pre-oxygenation with 100% oxygen Induction Type: IV induction LMA: LMA inserted and LMA with gastric port inserted LMA Size: 3.0 Number of attempts: 2 Placement Confirmation: positive ETCO2 and breath sounds checked- equal and bilateral Tube secured with: Tape Dental Injury: Teeth and Oropharynx as per pre-operative assessment  Comments: Inserted LMA Supreme #4 - unable to position well with edentulous mouth.   Switched to LMA Proseal #3.

## 2021-12-21 NOTE — Anesthesia Preprocedure Evaluation (Signed)
Anesthesia Evaluation  Patient identified by MRN, date of birth, ID band Patient awake    Reviewed: Allergy & Precautions, NPO status , Patient's Chart, lab work & pertinent test results  History of Anesthesia Complications (+) history of anesthetic complications  Airway Mallampati: II  TM Distance: >3 FB Neck ROM: Full    Dental no notable dental hx.    Pulmonary neg pulmonary ROS,    Pulmonary exam normal breath sounds clear to auscultation       Cardiovascular hypertension, Pt. on medications Normal cardiovascular exam Rhythm:Regular Rate:Normal     Neuro/Psych negative neurological ROS  negative psych ROS   GI/Hepatic negative GI ROS, Neg liver ROS,   Endo/Other  diabetesHypothyroidism   Renal/GU negative Renal ROS  negative genitourinary   Musculoskeletal negative musculoskeletal ROS (+)   Abdominal   Peds negative pediatric ROS (+)  Hematology negative hematology ROS (+)   Anesthesia Other Findings   Reproductive/Obstetrics negative OB ROS                             Anesthesia Physical Anesthesia Plan  ASA: 2  Anesthesia Plan: General   Post-op Pain Management: Regional block*   Induction: Intravenous  PONV Risk Score and Plan: 3 and Ondansetron, Dexamethasone, Droperidol and Treatment may vary due to age or medical condition  Airway Management Planned: LMA  Additional Equipment:   Intra-op Plan:   Post-operative Plan: Extubation in OR  Informed Consent: I have reviewed the patients History and Physical, chart, labs and discussed the procedure including the risks, benefits and alternatives for the proposed anesthesia with the patient or authorized representative who has indicated his/her understanding and acceptance.     Dental advisory given  Plan Discussed with: CRNA and Surgeon  Anesthesia Plan Comments:         Anesthesia Quick Evaluation

## 2021-12-21 NOTE — Brief Op Note (Signed)
12/21/2021  12:21 PM  PATIENT:  Marissa Thompson  71 y.o. female  PRE-OPERATIVE DIAGNOSIS:  HALLUX VALGUS,METATARSIS ADDECTUS LEFT FOOT  POST-OPERATIVE DIAGNOSIS:  HALLUX VALGUS,METATARSIS ADDECTUS LEFT FOOT  PROCEDURE:  Procedure(s): HALLUX VALGUS LAPIDUS (Left) ARTHRODESIS LIS FRANC (Left)  SURGEON:  Surgeon(s) and Role:    Felecia Shelling, DPM - Primary  PHYSICIAN ASSISTANT: none  ASSISTANTS: none   ANESTHESIA:   regional and general  EBL:  25 mL   BLOOD ADMINISTERED:none  DRAINS: none   LOCAL MEDICATIONS USED:  NONE  SPECIMEN:  No Specimen  DISPOSITION OF SPECIMEN:  N/A  COUNTS:  YES  TOURNIQUET:   Total Tourniquet Time Documented: Calf (Left) - 120 minutes Total: Calf (Left) - 120 minutes   DICTATION: .Dragon Dictation  PLAN OF CARE: Discharge to home after PACU  PATIENT DISPOSITION:  PACU - hemodynamically stable.   Delay start of Pharmacological VTE agent (>24hrs) due to surgical blood loss or risk of bleeding: not applicable

## 2021-12-21 NOTE — Anesthesia Procedure Notes (Signed)
Anesthesia Procedure Image    

## 2021-12-21 NOTE — Anesthesia Procedure Notes (Signed)
Anesthesia Regional Block: Popliteal block   Pre-Anesthetic Checklist: , timeout performed,  Correct Patient, Correct Site, Correct Laterality,  Correct Procedure, Correct Position, site marked,  Risks and benefits discussed,  Surgical consent,  Pre-op evaluation,  At surgeon's request and post-op pain management  Laterality: Left  Prep: chloraprep       Needles:  Injection technique: Single-shot  Needle Type: Echogenic Needle     Needle Length: 9cm      Additional Needles:   Procedures:,,,, ultrasound used (permanent image in chart),,    Narrative:  Start time: 12/21/2021 8:22 AM End time: 12/21/2021 8:31 AM Injection made incrementally with aspirations every 5 mL.  Performed by: Personally  Anesthesiologist: Eilene Ghazi, MD  Additional Notes: Patient tolerated the procedure well without complications

## 2021-12-21 NOTE — Progress Notes (Signed)
Orthopedic Tech Progress Note Patient Details:  Marissa Thompson 1951-03-13 [id number]  Patient ID: Marissa Thompson, female   DOB: [birth year], 71 y.o.   MRN: [id number]  Marissa Thompson 12/21/2021, 12:29 PM Cam boot applied to left leg in PACU

## 2021-12-21 NOTE — Transfer of Care (Signed)
Immediate Anesthesia Transfer of Care Note  Patient: Marissa Thompson  Procedure(s) Performed: HALLUX VALGUS LAPIDUS (Left) ARTHRODESIS LIS FRANC (Left: Foot)  Patient Location: PACU  Anesthesia Type:GA combined with regional for post-op pain  Level of Consciousness: awake, alert  and patient cooperative  Airway & Oxygen Therapy: Patient Spontanous Breathing and Patient connected to face mask oxygen  Post-op Assessment: Report given to RN and Post -op Vital signs reviewed and stable  Post vital signs: Reviewed and stable  Last Vitals:  Vitals Value Taken Time  BP 114/57 12/21/21 1220  Temp    Pulse 89 12/21/21 1222  Resp 13 12/21/21 1222  SpO2 95 % 12/21/21 1222  Vitals shown include unvalidated device data.  Last Pain:  Vitals:   12/21/21 0722  TempSrc: Oral         Complications: No notable events documented.

## 2021-12-22 ENCOUNTER — Encounter (HOSPITAL_COMMUNITY): Payer: Self-pay | Admitting: Podiatry

## 2021-12-22 NOTE — Op Note (Signed)
OPERATIVE REPORT Patient name: Marissa CornerJudith M Iturralde MRN: [id number] DOB: 08/03/1950  DOS: 12/22/21  Preop Dx: Hallux valgus left.  Metatarsus adductus left. Postop Dx: same  Procedure:  1.  Tarsometatarsal arthrodesis 2, 3 left 2.  Lapidus bunionectomy left 3.  Akin proximal phalangeal osteotomy left  Surgeon: Felecia ShellingBrent M. Neelah Mannings DPM  Anesthesia: General with preoperative popliteal block provided by anesthesia  Hemostasis: Calf tourniquet inflated to a pressure of 300mmHg after esmarch exsanguination   EBL: 50 mL Materials: [employer] arthrodesis plate x4 w/ respective screws.  Synthes 2.7 mm solid cortical screw x1 Injectables: None Pathology: None  Condition: The patient tolerated the procedure and anesthesia well. No complications noted or reported   Justification for procedure: The patient is a 71 y.o. female who presents today for surgical correction of symptomatic metatarsus adductus and hallux valgus deformity of the left foot. All conservative modalities of been unsuccessful in providing any sort of satisfactory alleviation of symptoms with the patient. The patient was told benefits as well as possible side effects of the surgery. The patient consented for surgical correction. The patient consent form was reviewed. All patient questions were answered. No guarantees were expressed or implied. The patient and the surgeon both signed the patient consent form with the witness present and placed in the patient's chart.   Procedure in Detail: The patient was brought to the operating room, placed in the operating table in the supine position at which time an aseptic scrub and drape were performed about the patient's respective lower extremity after anesthesia was induced as described above. Attention was then directed to the surgical area where procedure number one commenced.  Procedure #1: Tarsometatarsal arthrodesis 2,3 left foot 4 cm linear longitudinal skin incision was planned  and made overlying the third TMT of the left foot.  Incision was carefully dissected down to the level of joint capsule with care taken to cut clamp ligate and retract away all small neurovascular structures traversing the incision site.  At this time the second and third tarsometatarsal joints were identified and dorsal capsulotomy was performed.  Lateral release of the lateral portion of the third TMT was also performed using the provided lateral release instrument in the [facility] for mobilization of the second and third TMT's.  After appropriate dissection and visualization of the second and third TMT, joint preparation was performed with the associated cut guides provided in the surgical tray.  The cartilaginous surfaces of the joint were resected away using the cut guides which were verified by intraoperative x-ray fluoroscopy.  Joint distractor was applied and the joint was distracted in order to allow for good resection of the cartilaginous surfaces followed by joint preparation consisting of subchondral drilling to allow for more optimal potential for arthrodesis.  Prior to subchondral drilling copious irrigation was utilized.  The second and third TMT was reduced after appropriate joint preparation and verified by intraoperative x-ray fluoroscopy.  Temporary fixation utilized using all of wires.  Permanent internal fixation was performed with the [employer] arthrodesis plate and respective screws.  Temporary fixation removed.  Again, verified by intraoperative x-ray fluoroscopy.  Inserted in the standard AO fashion.  Attention was then directed to the first TMT joint where procedure #2 commenced  Procedure #2: Lapidus bunionectomy left foot A 0.5 cm small incision was planned and made to the lateral portion of the first MTP joint in order to create the lateral release and allow better mobilization and reduction of of the IM  angle.  A lateral release instrament provided within the  surgical tray was utilized to release the lateral capsule and collateral ligaments of the first MTP.  Another 4 cm linear longitudinal skin incision was planned and made overlying the first TMT of the left foot.  Incision carefully dissected down to the level of joint capsule with care taken to cut clamp ligate and retract away all small neurovascular structures traversing the incision site.  Capsulotomy was performed of the first TMT joint and surrounding capsular tissue was released in order to allow for good mobilization and reduction of the metatarsal.  Cut guides provided within the surgical tray were utilized to resect away the cartilaginous surfaces of the cuneiform and metatarsal which were verified by intraoperative x-ray fluoroscopy.  The joint was then distracted and cartilaginous surfaces were removed followed by joint preparation consisting of subchondral drilling.  Copious irrigation was utilized prior to subchondral drilling.  1 mL of DBX bone putty was placed within the joint to allow for more optimal arthrodesis of the joint.  Stepwise approach to reducing the IM angle of the hallux valgus deformity was performed as indicated in the surgical technique guide.  Verified by intraoperative x-ray fluoroscopy.  Temporary fixation was achieved using all of wires followed by permanent internal fixation using 2 arthrodesis plates with respective screws which were inserted in the standard AO fashion.  Temporary fixation removed.  After completion of the Lapidus bunionectomy there was still some residual lateral deviation of the hallux warranting an Akin  osteotomy to better align the first ray of the foot.  Attention was then directed to the proximal phalanx of the toe where procedure #3 commence  Procedure #3: Akin proximal phalangeal osteotomy left foot A separate 3 cm linear longitudinal skin incision was planned and made overlying the first MTP and proximal phalanx of the left hallux.  Incision carried  down to the level of joint capsule and bone with care taken to cut clamp ligate and retract away all small neurovascular structures traversing the incision site as well as identification of the EHL tendon which was retracted and preserved.  A sagittal blade mounted on sagittal saw was utilized to resect away the hypertrophic medial eminence of the first MTP joint which was verified by intraoperative x-ray fluoroscopy.  Attention was then directed to the proximal phalanx where an oblique wedge osteotomy was performed using a sagittal blade mounted a sagittal saw.  The wedge portion of bone was removed and the phalanx was reduced which created better alignment of the first ray and hallux.  Irrigation was utilized and permanent internal fixation was achieved using a 2.7 mm solid Synthes screw which was inserted in the standard AO fashion.  Verified by intraoperative x-ray fluoroscopy.  Copious irrigation of all incision sites was performed followed by routine primary closure consisting of 3-0 Vicryl, 4-0 Vicryl, and 4-0 Prolene to reapproximate superficial skin edges. Dry sterile compressive dressings were then applied to all previously mentioned incision sites about the patient's lower extremity. The tourniquet which was used for hemostasis was deflated. All normal neurovascular responses including pink color and warmth returned all the digits of patient's lower extremity.  The patient was then transferred from the operating room to the recovery room having tolerated the procedure and anesthesia well. All vital signs are stable. After a brief stay in the recovery room the patient was discharged with adequate prescriptions for analgesia. Verbal as well as written instructions were provided for the patient regarding wound care. The  patient is to keep the dressings clean dry and intact until they are to follow surgeon Dr. Gala Lewandowsky in the office upon discharge.   Felecia Shelling, DPM [facility]  Dr. Felecia Shelling, DPM    [address] [place], [place] [address]                Office [phone]  Fax [phone]

## 2021-12-27 ENCOUNTER — Encounter: Payer: Medicare HMO | Admitting: Podiatry

## 2021-12-27 ENCOUNTER — Ambulatory Visit (INDEPENDENT_AMBULATORY_CARE_PROVIDER_SITE_OTHER): Payer: Medicare HMO

## 2021-12-27 ENCOUNTER — Ambulatory Visit (INDEPENDENT_AMBULATORY_CARE_PROVIDER_SITE_OTHER): Payer: Medicare HMO | Admitting: Podiatry

## 2021-12-27 DIAGNOSIS — M2012 Hallux valgus (acquired), left foot: Secondary | ICD-10-CM | POA: Diagnosis not present

## 2021-12-27 DIAGNOSIS — Z9889 Other specified postprocedural states: Secondary | ICD-10-CM

## 2021-12-27 DIAGNOSIS — M2011 Hallux valgus (acquired), right foot: Secondary | ICD-10-CM

## 2021-12-27 MED ORDER — OXYCODONE-ACETAMINOPHEN 5-325 MG PO TABS: 1.0000 | ORAL_TABLET | ORAL | 0 refills | Status: DC | PRN

## 2021-12-27 MED ORDER — DOXYCYCLINE HYCLATE 100 MG PO TABS: 100.0000 mg | ORAL_TABLET | Freq: Two times a day (BID) | ORAL | 0 refills | Status: DC

## 2021-12-27 NOTE — Progress Notes (Signed)
Dg  

## 2021-12-27 NOTE — Progress Notes (Signed)
   Subjective:  Patient presents today status post left foot tarsometatarsal arthrodesis with Lapidus type bunionectomy LT foot. DOS: 12/21/2021.  Patient states that the pain is controlled with the pain medication.  She has had a significant amount of pain over the past week.  She has been nonweightbearing in the cam boot as instructed.  No new complaints at this time  Past Medical History:  Diagnosis Date   Allergy    Anxiety 2003   Carpal tunnel syndrome, bilateral    Cough    for over a year per pt-   Depression 1998   Diabetes mellitus without complication (HCC) 1998   Dupuytren's contracture of left hand    DVT (deep venous thrombosis) (HCC) 2007   left calf x2   GERD (gastroesophageal reflux disease) 1980   History of alcoholism (HCC)    Chronic   HLD (hyperlipidemia)    Hyperlipidemia 1990   Hypertension    Hypothyroidism 1998   Macular degeneration 07/31/2018   2020   Macular degeneration 12/2019   PONV (postoperative nausea and vomiting)    1 time per pt in 2001   Short-term memory loss    SOB (shortness of breath) on exertion       Objective/Physical Exam Neurovascular status intact.  Skin incisions appear to be well coapted with sutures intact.  There is some mild erythema localized around the surgical incision sites. No dehiscence. No active bleeding noted. Moderate edema noted to the surgical extremity.  There is also some pooling ecchymosis noted to the toes from surgery  Radiographic Exam:  Orthopedic hardware and osteotomies sites appear to be stable with routine healing.  Orthopedic arthrodesis plates are intact  Assessment: 1. s/p tarsometatarsal arthrodesis with Lapidus and Aiken type bunionectomy LT. DOS: 12/21/2021   Plan of Care:  1. Patient was evaluated. X-rays reviewed 2.  Dressings changed.  Clean dry and intact x1 week 3.  Due to the mild erythema I did call in a prescription for doxycycline 100 mg 2 times daily #20 4.  Refill prescription for  Percocet 10/325 mg 5.  Continue strict nonweightbearing to the surgical extremity. 6.  Patient states that the cam boot was digging into her leg and causing significant pain.  Today I dispensed a surgical shoe however the patient understands that she needs to continue nonweightbearing 7.  Return to clinic 1 week   Felecia Shelling, DPM [facility]  Dr. Felecia Shelling, DPM    [address] [place], [place] [address]                Office [phone]  Fax [phone]

## 2021-12-28 ENCOUNTER — Telehealth: Payer: Self-pay | Admitting: *Deleted

## 2021-12-28 NOTE — Telephone Encounter (Signed)
Patient is calling she started throwing up after taking 2 doses of antibiotics(doxycycline) has been taking for 2 days. Please advise.

## 2021-12-29 ENCOUNTER — Other Ambulatory Visit: Payer: Self-pay | Admitting: Podiatry

## 2021-12-29 MED ORDER — SULFAMETHOXAZOLE-TRIMETHOPRIM 800-160 MG PO TABS: 1.0000 | ORAL_TABLET | Freq: Two times a day (BID) | ORAL | 0 refills | Status: DC

## 2021-12-29 NOTE — Telephone Encounter (Signed)
Spoke with patient verbalized understanding.

## 2022-01-03 ENCOUNTER — Ambulatory Visit (INDEPENDENT_AMBULATORY_CARE_PROVIDER_SITE_OTHER): Payer: Medicare HMO

## 2022-01-03 ENCOUNTER — Ambulatory Visit (INDEPENDENT_AMBULATORY_CARE_PROVIDER_SITE_OTHER): Payer: Medicare HMO | Admitting: Podiatry

## 2022-01-03 ENCOUNTER — Encounter: Payer: Medicare HMO | Admitting: Podiatry

## 2022-01-03 DIAGNOSIS — Z9889 Other specified postprocedural states: Secondary | ICD-10-CM

## 2022-01-03 MED ORDER — HYDROCODONE-ACETAMINOPHEN 10-325 MG PO TABS: 1.0000 | ORAL_TABLET | ORAL | 0 refills | Status: AC | PRN

## 2022-01-03 NOTE — Progress Notes (Signed)
   Subjective:  Patient presents today status post left foot tarsometatarsal arthrodesis with Lapidus type bunionectomy LT foot. DOS: 12/21/2021.  Patient states that last night while sleeping in her bed she rolled over and fell out of her bed.  She injured her right shoulder and knee.  She was concerned that she possibly injured her surgical foot as well.  She presents today as an urgent work in for evaluation.  Past Medical History:  Diagnosis Date   Allergy    Anxiety 2003   Carpal tunnel syndrome, bilateral    Cough    for over a year per pt-   Depression 1998   Diabetes mellitus without complication (HCC) 1998   Dupuytren's contracture of left hand    DVT (deep venous thrombosis) (HCC) 2007   left calf x2   GERD (gastroesophageal reflux disease) 1980   History of alcoholism (HCC)    Chronic   HLD (hyperlipidemia)    Hyperlipidemia 1990   Hypertension    Hypothyroidism 1998   Macular degeneration 07/31/2018   2020   Macular degeneration 12/2019   PONV (postoperative nausea and vomiting)    1 time per pt in 2001   Short-term memory loss    SOB (shortness of breath) on exertion       Objective/Physical Exam Neurovascular status intact.  Skin incisions appear to be well coapted with sutures intact.  Erythema appears significantly improved and mostly resolved.  No dehiscence.  No drainage.  There continues to be heavy edema to the surgical foot.  Radiographic Exam:  Orthopedic hardware and osteotomies sites appear to be stable with routine healing.  Orthopedic arthrodesis plates are intact  Assessment: 1. s/p tarsometatarsal arthrodesis with Lapidus and Aiken type bunionectomy LT. DOS: 12/21/2021   Plan of Care:  1. Patient was evaluated. X-rays reviewed which were negative for any acute change or injury due to the patient's fall last night.  Orthopedic hardware is intact 2.  Sutures were removed today 3.  Significant improvement of the erythema.  Continue the Bactrim DS  until completed 4.  To address the swelling, multilayer Unna boot compression wrap was applied to the left lower extremity. 5.  Continue nonweightbearing in the cam boot.  I did explain to the patient that if she needs to she can apply some slight pressure partial weightbearing with the assistance of a walker 6.  Prescription for Vicodin 10/325 mg.  Patient states that the oxycodone does create nauseousness.  She has been known to tolerate Vicodin in the past.   7.  Return to clinic in 1 week for dressing change under the nurse schedule.  I will not be here next week so we will have the nurse changed out her multilayer Unna boot compression wrap.  I will see her in 2 weeks from today   Felecia Shelling, DPM [facility]  Dr. Felecia Shelling, DPM    [address] [place], [place] [address]                Office [phone]  Fax [phone]

## 2022-01-10 ENCOUNTER — Inpatient Hospital Stay (HOSPITAL_COMMUNITY)
Admission: EM | Admit: 2022-01-10 | Discharge: 2022-01-18 | DRG: 863 | Disposition: A | Discharge status: Skilled Nursing Facility | Payer: Medicare HMO | Attending: Student | Admitting: Student

## 2022-01-10 ENCOUNTER — Other Ambulatory Visit: Payer: Medicare HMO

## 2022-01-10 ENCOUNTER — Encounter (HOSPITAL_COMMUNITY): Payer: Self-pay

## 2022-01-10 ENCOUNTER — Emergency Department (HOSPITAL_COMMUNITY): Payer: Medicare HMO

## 2022-01-10 DIAGNOSIS — Z808 Family history of malignant neoplasm of other organs or systems: Secondary | ICD-10-CM

## 2022-01-10 DIAGNOSIS — E1169 Type 2 diabetes mellitus with other specified complication: Secondary | ICD-10-CM | POA: Diagnosis present

## 2022-01-10 DIAGNOSIS — E039 Hypothyroidism, unspecified: Secondary | ICD-10-CM | POA: Diagnosis present

## 2022-01-10 DIAGNOSIS — E872 Acidosis, unspecified: Secondary | ICD-10-CM | POA: Diagnosis present

## 2022-01-10 DIAGNOSIS — M86172 Other acute osteomyelitis, left ankle and foot: Secondary | ICD-10-CM | POA: Diagnosis not present

## 2022-01-10 DIAGNOSIS — R69 Illness, unspecified: Secondary | ICD-10-CM | POA: Diagnosis not present

## 2022-01-10 DIAGNOSIS — Z811 Family history of alcohol abuse and dependence: Secondary | ICD-10-CM

## 2022-01-10 DIAGNOSIS — E782 Mixed hyperlipidemia: Secondary | ICD-10-CM | POA: Diagnosis not present

## 2022-01-10 DIAGNOSIS — Z8249 Family history of ischemic heart disease and other diseases of the circulatory system: Secondary | ICD-10-CM

## 2022-01-10 DIAGNOSIS — E1159 Type 2 diabetes mellitus with other circulatory complications: Secondary | ICD-10-CM | POA: Diagnosis present

## 2022-01-10 DIAGNOSIS — I152 Hypertension secondary to endocrine disorders: Secondary | ICD-10-CM | POA: Diagnosis present

## 2022-01-10 DIAGNOSIS — Z794 Long term (current) use of insulin: Secondary | ICD-10-CM | POA: Diagnosis not present

## 2022-01-10 DIAGNOSIS — F418 Other specified anxiety disorders: Secondary | ICD-10-CM

## 2022-01-10 DIAGNOSIS — M869 Osteomyelitis, unspecified: Secondary | ICD-10-CM | POA: Diagnosis present

## 2022-01-10 DIAGNOSIS — E1142 Type 2 diabetes mellitus with diabetic polyneuropathy: Secondary | ICD-10-CM | POA: Diagnosis present

## 2022-01-10 DIAGNOSIS — D519 Vitamin B12 deficiency anemia, unspecified: Secondary | ICD-10-CM | POA: Diagnosis present

## 2022-01-10 DIAGNOSIS — H3530 Unspecified macular degeneration: Secondary | ICD-10-CM | POA: Diagnosis present

## 2022-01-10 DIAGNOSIS — Z6832 Body mass index (BMI) 32.0-32.9, adult: Secondary | ICD-10-CM

## 2022-01-10 DIAGNOSIS — Z86718 Personal history of other venous thrombosis and embolism: Secondary | ICD-10-CM

## 2022-01-10 DIAGNOSIS — E669 Obesity, unspecified: Secondary | ICD-10-CM | POA: Diagnosis present

## 2022-01-10 DIAGNOSIS — Z79899 Other long term (current) drug therapy: Secondary | ICD-10-CM

## 2022-01-10 DIAGNOSIS — K219 Gastro-esophageal reflux disease without esophagitis: Secondary | ICD-10-CM | POA: Diagnosis present

## 2022-01-10 DIAGNOSIS — G8929 Other chronic pain: Secondary | ICD-10-CM | POA: Diagnosis present

## 2022-01-10 DIAGNOSIS — Z833 Family history of diabetes mellitus: Secondary | ICD-10-CM

## 2022-01-10 DIAGNOSIS — G2581 Restless legs syndrome: Secondary | ICD-10-CM | POA: Diagnosis present

## 2022-01-10 DIAGNOSIS — T8141XA Infection following a procedure, superficial incisional surgical site, initial encounter: Principal | ICD-10-CM | POA: Diagnosis present

## 2022-01-10 DIAGNOSIS — D649 Anemia, unspecified: Secondary | ICD-10-CM | POA: Diagnosis present

## 2022-01-10 DIAGNOSIS — R296 Repeated falls: Secondary | ICD-10-CM | POA: Diagnosis present

## 2022-01-10 DIAGNOSIS — L03116 Cellulitis of left lower limb: Secondary | ICD-10-CM | POA: Diagnosis not present

## 2022-01-10 DIAGNOSIS — Z79890 Hormone replacement therapy (postmenopausal): Secondary | ICD-10-CM

## 2022-01-10 DIAGNOSIS — E119 Type 2 diabetes mellitus without complications: Secondary | ICD-10-CM

## 2022-01-10 DIAGNOSIS — F339 Major depressive disorder, recurrent, unspecified: Secondary | ICD-10-CM | POA: Diagnosis present

## 2022-01-10 DIAGNOSIS — Z818 Family history of other mental and behavioral disorders: Secondary | ICD-10-CM

## 2022-01-10 DIAGNOSIS — E1165 Type 2 diabetes mellitus with hyperglycemia: Secondary | ICD-10-CM

## 2022-01-10 DIAGNOSIS — M7989 Other specified soft tissue disorders: Secondary | ICD-10-CM | POA: Diagnosis not present

## 2022-01-10 DIAGNOSIS — F419 Anxiety disorder, unspecified: Secondary | ICD-10-CM | POA: Diagnosis present

## 2022-01-10 DIAGNOSIS — E8809 Other disorders of plasma-protein metabolism, not elsewhere classified: Secondary | ICD-10-CM | POA: Diagnosis present

## 2022-01-10 DIAGNOSIS — S92412A Displaced fracture of proximal phalanx of left great toe, initial encounter for closed fracture: Secondary | ICD-10-CM | POA: Diagnosis not present

## 2022-01-10 DIAGNOSIS — K5900 Constipation, unspecified: Secondary | ICD-10-CM | POA: Diagnosis present

## 2022-01-10 DIAGNOSIS — Y848 Other medical procedures as the cause of abnormal reaction of the patient, or of later complication, without mention of misadventure at the time of the procedure: Secondary | ICD-10-CM | POA: Diagnosis present

## 2022-01-10 DIAGNOSIS — Z9889 Other specified postprocedural states: Secondary | ICD-10-CM | POA: Diagnosis not present

## 2022-01-10 LAB — CBC WITH DIFFERENTIAL/PLATELET
Abs Immature Granulocytes: 0.01 10*3/uL (ref 0.00–0.07)
Basophils Absolute: 0 10*3/uL (ref 0.0–0.1)
Basophils Relative: 1 %
Eosinophils Absolute: 0.1 10*3/uL (ref 0.0–0.5)
Eosinophils Relative: 2 %
HCT: 38.9 % (ref 36.0–46.0)
Hemoglobin: 11.8 g/dL — ABNORMAL LOW (ref 12.0–15.0)
Immature Granulocytes: 0 %
Lymphocytes Relative: 30 %
Lymphs Abs: 1.2 10*3/uL (ref 0.7–4.0)
MCH: 25.7 pg — ABNORMAL LOW (ref 26.0–34.0)
MCHC: 30.3 g/dL (ref 30.0–36.0)
MCV: 84.7 fL (ref 80.0–100.0)
Monocytes Absolute: 0.3 10*3/uL (ref 0.1–1.0)
Monocytes Relative: 8 %
Neutro Abs: 2.4 10*3/uL (ref 1.7–7.7)
Neutrophils Relative %: 59 %
Platelets: 335 10*3/uL (ref 150–400)
RBC: 4.59 MIL/uL (ref 3.87–5.11)
RDW: 16.8 % — ABNORMAL HIGH (ref 11.5–15.5)
WBC: 4.1 10*3/uL (ref 4.0–10.5)
nRBC: 0 % (ref 0.0–0.2)

## 2022-01-10 LAB — BASIC METABOLIC PANEL
Anion gap: 9 (ref 5–15)
BUN: 10 mg/dL (ref 8–23)
CO2: 24 mmol/L (ref 22–32)
Calcium: 9 mg/dL (ref 8.9–10.3)
Chloride: 104 mmol/L (ref 98–111)
Creatinine, Ser: 0.63 mg/dL (ref 0.44–1.00)
GFR, Estimated: >60 mL/min (ref >60)
Glucose, Bld: 231 mg/dL — ABNORMAL HIGH (ref 70–99)
Potassium: 4.1 mmol/L (ref 3.5–5.1)
Sodium: 137 mmol/L (ref 135–145)

## 2022-01-10 LAB — LACTIC ACID, PLASMA
Lactic Acid, Venous: 2.6 mmol/L (ref 0.5–1.9)
Lactic Acid, Venous: 2.4 mmol/L (ref 0.5–1.9)

## 2022-01-10 LAB — SEDIMENTATION RATE: Sed Rate: 11 mm/hr (ref 0–22)

## 2022-01-10 LAB — URIC ACID: Uric Acid, Serum: 4.4 mg/dL (ref 2.5–7.1)

## 2022-01-10 MED ORDER — CITALOPRAM HYDROBROMIDE 20 MG PO TABS
20.0000 mg | ORAL_TABLET | Freq: Every day | ORAL | Status: DC
  Administered 2022-01-11 – 2022-01-18 (×8): 20 mg via ORAL
  Filled 2022-01-10 (×2): qty 2
  Filled 2022-01-10: qty 1
  Filled 2022-01-10 (×2): qty 2
  Filled 2022-01-10: qty 1
  Filled 2022-01-10 (×2): qty 2

## 2022-01-10 MED ORDER — INSULIN ASPART 100 UNIT/ML IJ SOLN
0.0000 [IU] | Freq: Every day | INTRAMUSCULAR | Status: DC
  Administered 2022-01-12 – 2022-01-14 (×3): 2 [IU] via SUBCUTANEOUS
  Administered 2022-01-17: 5 [IU] via SUBCUTANEOUS

## 2022-01-10 MED ORDER — HYDROMORPHONE HCL 1 MG/ML IJ SOLN
1.0000 mg | Freq: Once | INTRAMUSCULAR | Status: AC
  Administered 2022-01-10: 1 mg via INTRAVENOUS
  Filled 2022-01-10: qty 1

## 2022-01-10 MED ORDER — SODIUM CHLORIDE 0.9% FLUSH
3.0000 mL | Freq: Two times a day (BID) | INTRAVENOUS | Status: DC
Start: 2022-01-10 — End: 2022-01-18
  Administered 2022-01-10 – 2022-01-17 (×13): 3 mL via INTRAVENOUS

## 2022-01-10 MED ORDER — GABAPENTIN 400 MG PO CAPS
1200.0000 mg | ORAL_CAPSULE | Freq: Two times a day (BID) | ORAL | Status: DC
  Administered 2022-01-10 – 2022-01-18 (×16): 1200 mg via ORAL
  Filled 2022-01-10 (×16): qty 3

## 2022-01-10 MED ORDER — PRAVASTATIN SODIUM 20 MG PO TABS
40.0000 mg | ORAL_TABLET | Freq: Every day | ORAL | Status: DC
  Administered 2022-01-11 – 2022-01-18 (×8): 40 mg via ORAL
  Filled 2022-01-10 (×9): qty 2

## 2022-01-10 MED ORDER — PANTOPRAZOLE SODIUM 40 MG PO TBEC
40.0000 mg | DELAYED_RELEASE_TABLET | Freq: Every day | ORAL | Status: DC
  Administered 2022-01-11 – 2022-01-18 (×8): 40 mg via ORAL
  Filled 2022-01-10 (×8): qty 1

## 2022-01-10 MED ORDER — INSULIN GLARGINE-YFGN 100 UNIT/ML ~~LOC~~ SOLN
30.0000 [IU] | Freq: Every day | SUBCUTANEOUS | Status: DC
  Administered 2022-01-11 – 2022-01-14 (×5): 30 [IU] via SUBCUTANEOUS
  Filled 2022-01-10 (×9): qty 0.3

## 2022-01-10 MED ORDER — LISINOPRIL 5 MG PO TABS
2.5000 mg | ORAL_TABLET | Freq: Every day | ORAL | Status: DC
  Administered 2022-01-11 – 2022-01-18 (×7): 2.5 mg via ORAL
  Filled 2022-01-10 (×7): qty 1

## 2022-01-10 MED ORDER — LACTATED RINGERS IV SOLN
INTRAVENOUS | Status: AC
Start: 2022-01-10 — End: 2022-01-11

## 2022-01-10 MED ORDER — CLONAZEPAM 0.5 MG PO TABS
0.5000 mg | ORAL_TABLET | Freq: Every evening | ORAL | Status: DC | PRN
  Administered 2022-01-11 – 2022-01-17 (×6): 0.5 mg via ORAL
  Filled 2022-01-10 (×6): qty 1

## 2022-01-10 MED ORDER — INSULIN ASPART 100 UNIT/ML IJ SOLN
0.0000 [IU] | Freq: Three times a day (TID) | INTRAMUSCULAR | Status: DC
  Administered 2022-01-11: 3 [IU] via SUBCUTANEOUS
  Administered 2022-01-12: 2 [IU] via SUBCUTANEOUS
  Administered 2022-01-12 – 2022-01-13 (×2): 3 [IU] via SUBCUTANEOUS
  Administered 2022-01-14: 2 [IU] via SUBCUTANEOUS
  Administered 2022-01-14 – 2022-01-15 (×2): 3 [IU] via SUBCUTANEOUS
  Administered 2022-01-16: 5 [IU] via SUBCUTANEOUS
  Administered 2022-01-17: 3 [IU] via SUBCUTANEOUS
  Administered 2022-01-18: 2 [IU] via SUBCUTANEOUS

## 2022-01-10 MED ORDER — LEVOTHYROXINE SODIUM 100 MCG PO TABS
100.0000 ug | ORAL_TABLET | Freq: Every day | ORAL | Status: DC
  Administered 2022-01-11 – 2022-01-17 (×7): 100 ug via ORAL
  Filled 2022-01-10 (×8): qty 1

## 2022-01-10 MED ORDER — LACTATED RINGERS IV BOLUS
1000.0000 mL | Freq: Once | INTRAVENOUS | Status: AC
  Administered 2022-01-10: 1000 mL via INTRAVENOUS

## 2022-01-10 MED ORDER — ACETAMINOPHEN 650 MG RE SUPP: 650.0000 mg | Freq: Four times a day (QID) | RECTAL | Status: DC | PRN

## 2022-01-10 MED ORDER — ACETAMINOPHEN 325 MG PO TABS
650.0000 mg | ORAL_TABLET | Freq: Four times a day (QID) | ORAL | Status: DC | PRN
  Administered 2022-01-11 – 2022-01-13 (×2): 650 mg via ORAL
  Filled 2022-01-10 (×2): qty 2

## 2022-01-10 MED ORDER — LEVOTHYROXINE SODIUM 100 MCG PO TABS: 100.0000 ug | ORAL_TABLET | Freq: Every day | ORAL | Status: DC

## 2022-01-10 NOTE — ED Notes (Signed)
Pt reports having fall last Wednesday. Bruising observed/noted on right shoulder and hip.

## 2022-01-10 NOTE — H&P (Signed)
History and Physical   Marissa Thompson:[id number] DOB: 05-02-1951 DOA: 01/10/2022  PCP: Leamon Arnt, MD   Patient coming from: Home  Chief Complaint: Left foot pain and swelling  HPI: Marissa Thompson is a 71 y.o. female with medical history significant of hypertension, diabetes, GERD, hyperlipidemia, hypothyroidism, depression, RLS, macular degeneration presenting with a week of foot pain redness and swelling.  Patient underwent surgical intervention on her left foot for bunionectomy on the eighth of this month.  Since that procedure she did develop a superficial infection and was placed on doxycycline which she was unable to complete due to nausea and vomiting.  She was then switched to Bactrim with improvement initially.  She then had a fall on her back where she hit her foot and had some swelling and was placed on a Unna boot at that time but due to discomfort she had to remove portions of this until she had to take it off completely.  She apparently was healing appropriately a week ago during podiatry visit with nonconcerning x-rays at that time.  She is now presenting with a week of increasing edema, redness and pain.  She is also reporting chills and mild nausea.  She denies fevers, chest pain, shortness of breath, abdominal pain, constipation, diarrhea, vomiting.  ED Course: Vital signs in the ED stable.  Lab work-up included BMP with glucose 231.  CBC with hemoglobin stable 11.8.  CRP, ESR pending.  Lactic acid mildly elevated 2.6 with repeat pending.  Uric acid pending.  Blood cultures pending.  Left foot x-ray showed postsurgical changes and evidence of possible osteomyelitis at the base of the first metatarsal.  Patient received a dose of Dilaudid and a liter of fluids in the ED.  Podiatry was consulted and recommended admission and to hold off on antibiotics for attempted culture with surgical intervention tomorrow.  Review of Systems: As per HPI otherwise all other systems  reviewed and are negative.  Past Medical History:  Diagnosis Date   Allergy    Anxiety 2003   Carpal tunnel syndrome, bilateral    Cough    for over a year per pt-   Depression 1998   Diabetes mellitus without complication ([place]) 2505   Dupuytren's contracture of left hand    DVT (deep venous thrombosis) ([place]) 2007   left calf x2   GERD (gastroesophageal reflux disease) 1980   History of alcoholism ([place])    Chronic   HLD (hyperlipidemia)    Hyperlipidemia 1990   Hypertension    Hypothyroidism 1998   Macular degeneration 07/31/2018   2020   Macular degeneration 12/2019   PONV (postoperative nausea and vomiting)    1 time per pt in 2001   Short-term memory loss    SOB (shortness of breath) on exertion     Past Surgical History:  Procedure Laterality Date   ANTERIOR CERVICAL DISCECTOMY  02/2018   BACK SURGERY     x3 Lumbar   CARPAL TUNNEL RELEASE Left 03/08/2016   Procedure: LEFT CARPAL TUNNEL RELEASE;  Surgeon: Leanora Cover, MD;  Location: [facility];  Service: Orthopedics;  Laterality: Left;   CATARACT EXTRACTION Bilateral 6/21-7/21   CERVICAL POLYPECTOMY N/A 08/17/2013   Procedure: CERVICAL POLYPECTOMY;  Surgeon: Sharene Butters, MD;  Location: [facility];  Service: Gynecology;  Laterality: N/A;   COLON SURGERY  2007   benign mass   Dupuytren's release Left 2017   ECTOPIC PREGNANCY SURGERY  1979   ESOPHAGEAL DILATION  x3   EYE SURGERY     FOOT ARTHRODESIS Left 12/21/2021   Procedure: ARTHRODESIS LIS FRANC;  Surgeon: Edrick Kins, DPM;  Location: [facility];  Service: Podiatry;  Laterality: Left;   HALLUX VALGUS LAPIDUS Left 12/21/2021   Procedure: HALLUX VALGUS LAPIDUS;  Surgeon: Edrick Kins, DPM;  Location: [facility];  Service: Podiatry;  Laterality: Left;   HYSTEROSCOPY WITH D & C N/A 08/17/2013   Procedure: DILATATION AND CURETTAGE /HYSTEROSCOPY;  Surgeon: Sharene Butters, MD;  Location: [facility];  Service: Gynecology;  Laterality: N/A;  YAG LASER   NECK  SURGERY     c5-7 ACDF 3/15/has screws and plate in neck   TONSILLECTOMY     TRIGGER FINGER RELEASE Left 03/08/2016   Procedure: LEFT LONG TRIGGER RELEASE AND LEFT RING TRIGGER RELEASE;  Surgeon: Leanora Cover, MD;  Location: [facility];  Service: Orthopedics;  Laterality: Left;   UPPER GASTROINTESTINAL ENDOSCOPY      Social History  reports that she has never smoked. She has never used smokeless tobacco. She reports that she does not currently use alcohol. She reports that she does not use drugs.  Allergies  Allergen Reactions   Toradol [Ketorolac Tromethamine] Other (See Comments)    Slurred speech, confusion   Doxycycline Nausea And Vomiting   Baclofen Other (See Comments)    Memory Loss and shakes   Morphine And Related Itching   Penicillins Rash    Has patient had a PCN reaction causing immediate rash, facial/tongue/throat swelling, SOB or lightheadedness with hypotension: Yes Has patient had a PCN reaction causing severe rash involving mucus membranes or skin necrosis: No Has patient had a PCN reaction that required hospitalization: No Has patient had a PCN reaction occurring within the last 10 years: No If all of the above answers are "NO", then may proceed with Cephalosporin use.    Talwin [Pentazocine] Itching    Family History  Problem Relation Age of Onset   Depression Mother    Diabetes Mother    Hypertension Mother    Alcohol abuse Father    Arthritis Father    Cancer Father 59       Oral Cancer- had tongue, jaw resection--smoker and alcohol   Alcohol abuse Brother   Reviewed on admission  Prior to Admission medications   Medication Sig Start Date End Date Taking? Authorizing Provider  acetaminophen (TYLENOL) 500 MG tablet Take 1,000 mg by mouth at bedtime.    [provider]  Apoaequorin (PREVAGEN EXTRA STRENGTH) 20 MG CAPS Take 20 mg by mouth daily.    [provider]  aspirin EC 81 MG tablet Take 81 mg by mouth daily.     [provider]  Calcium Carbonate Antacid (TUMS PO) Take 1 tablet by mouth daily as needed (acid reflux).    [provider]  citalopram (CELEXA) 20 MG tablet TAKE 1 TABLET BY MOUTH EVERY DAY Patient taking differently: Take 20 mg by mouth daily. 08/26/21   Leamon Arnt, MD  clonazePAM (KLONOPIN) 0.5 MG tablet TAKE 1 TABLET BY MOUTH TWICE A DAY AS NEEDED FOR ANXIETY Patient taking differently: Take 0.5 mg by mouth at bedtime. 10/02/21   Leamon Arnt, MD  cyclobenzaprine (FLEXERIL) 10 MG tablet Take 1 tablet (10 mg total) by mouth 3 (three) times daily as needed for muscle spasms. Patient taking differently: Take 10 mg by mouth See admin instructions. Take 10 mg by mouth at twice daily as needed for muscle spasms 07/03/18  Leamon Arnt, MD  diphenhydrAMINE (BENADRYL) 25 MG tablet Take 50-75 mg by mouth at bedtime as needed for sleep.    [provider]  doxycycline (VIBRA-TABS) 100 MG tablet Take 1 tablet (100 mg total) by mouth 2 (two) times daily. 12/27/21   Edrick Kins, DPM  gabapentin (NEURONTIN) 600 MG tablet Take 2 tablets (1,200 mg total) by mouth 3 (three) times daily. Patient taking differently: Take 1,200 mg by mouth 2 (two) times daily. 05/29/21   Leamon Arnt, MD  ibuprofen (ADVIL,MOTRIN) 200 MG tablet Take 800 mg by mouth 2 (two) times daily as needed (back pain/sleep).    [provider]  JARDIANCE 25 MG TABS tablet TAKE 1 TABLET (25 MG TOTAL) BY MOUTH DAILY. Patient taking differently: Take 25 mg by mouth daily. 10/23/21   Leamon Arnt, MD  levothyroxine (SYNTHROID) 100 MCG tablet Take 1 tablet (100 mcg total) by mouth daily before breakfast. Patient taking differently: Take 100 mcg by mouth at bedtime. 10/11/21   Leamon Arnt, MD  lisinopril (PRINIVIL,ZESTRIL) 5 MG tablet Take 0.5 tablets (2.5 mg total) by mouth daily. 05/13/18   Leamon Arnt, MD  Melatonin 10 MG CAPS Take 20 mg by mouth at bedtime.    [provider]   metFORMIN (GLUCOPHAGE) 1000 MG tablet Take 1 tablet (1,000 mg total) by mouth 2 (two) times daily with a meal. 12/06/21   Leamon Arnt, MD  OVER THE COUNTER MEDICATION Take 2 tablets by mouth daily. vision shield    [provider]  OVER THE COUNTER MEDICATION Apply 1 application. topically at bedtime. veterinary liniment gel    [provider]  pantoprazole (PROTONIX) 40 MG tablet TAKE 1 TABLET BY MOUTH EVERY DAY Patient taking differently: Take 40 mg by mouth daily. 07/11/21   Leamon Arnt, MD  pravastatin (PRAVACHOL) 40 MG tablet TAKE 1 TABLET BY MOUTH EVERYDAY AT BEDTIME Patient taking differently: Take 40 mg by mouth daily. 10/30/21   Leamon Arnt, MD  sulfamethoxazole-trimethoprim (BACTRIM DS) 800-160 MG tablet Take 1 tablet by mouth 2 (two) times daily. 12/29/21   Felipa Furnace, DPM  TOUJEO SOLOSTAR 300 UNIT/ML Solostar Pen INJECT 45 UNITS INTO THE SKIN AT BEDTIME. Patient taking differently: Inject 30-40 Units into the skin at bedtime. 11/09/21   Leamon Arnt, MD    Physical Exam: Vitals:   01/10/22 2000 01/10/22 2015 01/10/22 2030 01/10/22 2130  BP: 130/66  (!) 119/92 130/72  Pulse: 73 77 80 72  Resp:   16 16  Temp:      TempSrc:      SpO2: 100% 99% 100% 100%    Physical Exam Constitutional:      General: She is not in acute distress.    Appearance: Normal appearance.  HENT:     Head: Normocephalic and atraumatic.     Mouth/Throat:     Mouth: Mucous membranes are moist.     Pharynx: Oropharynx is clear.  Eyes:     Extraocular Movements: Extraocular movements intact.     Pupils: Pupils are equal, round, and reactive to light.  Cardiovascular:     Rate and Rhythm: Normal rate and regular rhythm.     Pulses: Normal pulses.     Heart sounds: Normal heart sounds.  Pulmonary:     Effort: Pulmonary effort is normal. No respiratory distress.     Breath sounds: Normal breath sounds.  Abdominal:     General: Bowel sounds are normal. There  is no  distension.     Palpations: Abdomen is soft.     Tenderness: There is no abdominal tenderness.  Musculoskeletal:        General: Swelling present. No deformity.     Right lower leg: Edema present.     Comments: Right foot erythema, edema, warmth  Skin:    General: Skin is warm and dry.  Neurological:     General: No focal deficit present.     Mental Status: Mental status is at baseline.        Labs on Admission: I have personally reviewed following labs and imaging studies  CBC: Recent Labs  Lab 01/10/22 1633  WBC 4.1  NEUTROABS 2.4  HGB 11.8*  HCT 38.9  MCV 84.7  PLT 517    Basic Metabolic Panel: Recent Labs  Lab 01/10/22 1633  NA 137  K 4.1  CL 104  CO2 24  GLUCOSE 231*  BUN 10  CREATININE 0.63  CALCIUM 9.0    GFR: CrCl cannot be calculated (Unknown ideal weight.).  Liver Function Tests: No results for input(s): "AST", "ALT", "ALKPHOS", "BILITOT", "PROT", "ALBUMIN" in the last 168 hours.  Urine analysis:    Component Value Date/Time   COLORURINE YELLOW 07/13/2020 1408   APPEARANCEUR Sl Cloudy (A) 07/13/2020 1408   LABSPEC <=1.005 (A) 07/13/2020 1408   PHURINE 6.0 07/13/2020 1408   GLUCOSEU >=1000 (A) 07/13/2020 1408   HGBUR MODERATE (A) 07/13/2020 1408   BILIRUBINUR NEGATIVE 07/13/2020 1408   BILIRUBINUR positive 01/04/2020 1121   KETONESUR NEGATIVE 07/13/2020 1408   PROTEINUR Positive (A) 01/04/2020 1121   PROTEINUR NEGATIVE 03/21/2017 1129   UROBILINOGEN 0.2 07/13/2020 1408   NITRITE NEGATIVE 07/13/2020 1408   LEUKOCYTESUR SMALL (A) 07/13/2020 1408    Radiological Exams on Admission: DG Foot Complete Left  Result Date: 01/10/2022 CLINICAL DATA:  Foot pain following surgery EXAM: LEFT FOOT - COMPLETE 3+ VIEW COMPARISON:  01/03/2022, 12/27/2021, 11/22/2021 FINDINGS: Stable screw fixation of the first proximal phalanx across fracture or osteotomy without significant interval bridging callus. Surgical plate and multiple screw fixation across  the first second and third TMT joints. Multiple calcific densities or osseous fragments between the bases of the first and second metatarsals as before. Postsurgical changes at the head of the first metatarsal. Question mild resorptive change at the lateral base of first metatarsal. Persistent soft tissue swelling without emphysema. Possible small cortical erosion at the head of the first metatarsal. IMPRESSION: 1. Postsurgical changes of the first metatarsal and first through third TMT joints with similar positioning of orthopedic hardware. 2. Stable alignment of fracture osteotomy of the first proximal phalanx without significant interval bridging callus 3. Question mild resorptive changes along the lateral base of first metatarsal and possible small erosion at the head of the first metatarsal, question secondary to infection/osteomyelitis versus posttraumatic. Electronically Signed   By: Donavan Foil M.D.   On: 01/10/2022 17:15    EKG: Not performed in the emergency department  Assessment/Plan Principal Problem:   Osteomyelitis ([place]) Active Problems:   Hypertension associated with diabetes ([place])   GERD (gastroesophageal reflux disease)   Mixed hyperlipidemia   Hypothyroidism   Type 2 diabetes mellitus without complications ([place])   Major depression, recurrent, chronic ([facility])   Osteomyelitis > Patient presenting with a week of worsening swelling, warmth, redness in the setting of surgical intervention earlier this month for bunion and postoperative superficial infection initially responding to p.o. antibiotics but patient unable to complete course due to nausea and vomiting. >  Imaging suspicious for osteomyelitis.  Podiatry has been consulted and plan for procedural intervention tomorrow. > Hold off on antibiotics for now as there is no systemic symptoms currently with normal white count and normal vital signs, will hopefully be able to get intraoperative culture tomorrow. - We will monitor on  telemetry to ensure we will know if systemic symptoms develop - Hold off on antibiotics for now - Appreciate podiatry recommendations - Follow-up ESR, CRP, uric acid - Supportive care  Hypertension - Continue home lisinopril  Hyperlipidemia - Continue home pravastatin  Depression Anxiety - Continue home Celexa and as needed Klonopin  RLS - Continue home gabapentin  Hypothyroidism - Continue home Synthroid  Diabetes > 45 units long-acting insulin outpatient - We will continue with 30 units long-acting insulin and SSI  GERD - Continue home PPI  DVT prophylaxis: SCDs Code Status:   Full Home Family Communication:  Friend updated at bedside  Disposition Plan:   Patient is from:  Home  Anticipated DC to:  Home  Anticipated DC date:  1 to 3 days  Anticipated DC barriers: None  Consults called:  podiatry, consulted in the ED, plan for procedure tomorrow   Admission status:  Observation, telemetry  Severity of Illness: The appropriate patient status for this patient is OBSERVATION. Observation status is judged to be reasonable and necessary in order to provide the required intensity of service to ensure the patient's safety. The patient's presenting symptoms, physical exam findings, and initial radiographic and laboratory data in the context of their medical condition is felt to place them at decreased risk for further clinical deterioration. Furthermore, it is anticipated that the patient will be medically stable for discharge from the hospital within 2 midnights of admission.    Marcelyn Bruins MD [facility]  How to contact the [facility] Attending or Consulting provider [place] or covering provider during after hours [place], for this patient?   Check the care team in [facility] and look for a) attending/consulting [facility] provider listed and b) the [facility] team listed Log into [email or web address] and use [place]'s universal password to access. If you do not have the password, please  contact the hospital operator. Locate the [facility] provider you are looking for under [facility] and page to a number that you can be directly reached. If you still have difficulty reaching the provider, please page the [facility] (Director on Call) for the Hospitalists listed on amion for assistance.  01/10/2022, 9:38 PM

## 2022-01-10 NOTE — ED Provider Triage Note (Signed)
Emergency Medicine Provider Triage Evaluation Note  Marissa Thompson , a 71 y.o. female  was evaluated in triage.  Pt complains of left foot pain and swelling.  She is postop from a bunionectomy and hardware placement.  Reports this was previously infected and she was started on antibiotics.  Then placed in a Unna boot.  She states symptoms have worsened since.  Denies fever, chills or other complaints.  Review of Systems  Positive: As above Negative: As above  Physical Exam  BP 133/68 (BP Location: Right Arm)   Pulse 80   Temp 98.4 F (36.9 C) (Oral)   Resp 14   SpO2 100%  Gen:   Awake, no distress   Resp:  Normal effort  MSK:   Moves extremities without difficulty  Other:  Erythema, swelling, and significant tenderness noted.  Strong dopplerable left DP pulse present.  Medical Decision Making  Medically screening exam initiated at 4:17 PM.  Appropriate orders placed.  QUINTELLA MURA was informed that the remainder of the evaluation will be completed by another provider, this initial triage assessment does not replace that evaluation, and the importance of remaining in the ED until their evaluation is complete.     Marita Kansas, PA-C 01/10/22 1621

## 2022-01-10 NOTE — ED Provider Notes (Signed)
[facility]-EMERGENCY DEPT Provider Note  , HLD, HTN, CSN: [id number] Arrival date & time: 01/10/22  1534     History {Add pertinent medical, surgical, social history, OB history to HPI:1} Chief Complaint  Patient presents with   Post-op Problem   Foot Swelling    Marissa Thompson is a 71 y.o. female.    Patient as above with significant medical history as below, including anxiety, depression, DM, HTN, HLD  who presents to the ED with complaint of foot pain.  Patient underwent surgery with Dr. Logan Bores with podiatry 6/8, she developed a superficial infection following this, initially was on Doxy and Bactrim.  Patient did have nausea and vomiting with antibiotics and verbal completion of her antibiotic regimen.  Seen in the office a week ago and the wound appeared to be healing appropriately.  X-rays taken at that time 6/21 reviewed by podiatrist and appear within normal limits.  Patient presents to the ED today for worsening pain to her left foot over the past week.  Worsening swelling, redness.  Difficulty with ambulation secondary to foot pain.  Difficult to control pain at home with oral medications.  She reports chills, no fevers.  Mild nausea no vomiting.     Past Medical History:  Diagnosis Date   Allergy    Anxiety 2003   Carpal tunnel syndrome, bilateral    Cough    for over a year per pt-   Depression 1998   Diabetes mellitus without complication (HCC) 1998   Dupuytren's contracture of left hand    DVT (deep venous thrombosis) (HCC) 2007   left calf x2   GERD (gastroesophageal reflux disease) 1980   History of alcoholism (HCC)    Chronic   HLD (hyperlipidemia)    Hyperlipidemia 1990   Hypertension    Hypothyroidism 1998   Macular degeneration 07/31/2018   2020   Macular degeneration 12/2019   PONV (postoperative nausea and vomiting)    1 time per pt in 2001   Short-term memory loss    SOB (shortness of breath) on exertion     Past Surgical  History:  Procedure Laterality Date   ANTERIOR CERVICAL DISCECTOMY  02/2018   BACK SURGERY     x3 Lumbar   CARPAL TUNNEL RELEASE Left 03/08/2016   Procedure: LEFT CARPAL TUNNEL RELEASE;  Surgeon: Betha Loa, MD;  Location: [facility];  Service: Orthopedics;  Laterality: Left;   CATARACT EXTRACTION Bilateral 6/21-7/21   CERVICAL POLYPECTOMY N/A 08/17/2013   Procedure: CERVICAL POLYPECTOMY;  Surgeon: Mickel Baas, MD;  Location: [facility];  Service: Gynecology;  Laterality: N/A;   COLON SURGERY  2007   benign mass   Dupuytren's release Left 2017   ECTOPIC PREGNANCY SURGERY  1979   ESOPHAGEAL DILATION     x3   EYE SURGERY     FOOT ARTHRODESIS Left 12/21/2021   Procedure: ARTHRODESIS LIS FRANC;  Surgeon: Felecia Shelling, DPM;  Location: [facility];  Service: Podiatry;  Laterality: Left;   HALLUX VALGUS LAPIDUS Left 12/21/2021   Procedure: HALLUX VALGUS LAPIDUS;  Surgeon: Felecia Shelling, DPM;  Location: [facility];  Service: Podiatry;  Laterality: Left;   HYSTEROSCOPY WITH D & C N/A 08/17/2013   Procedure: DILATATION AND CURETTAGE /HYSTEROSCOPY;  Surgeon: Mickel Baas, MD;  Location: [facility];  Service: Gynecology;  Laterality: N/A;  YAG LASER   NECK SURGERY     c5-7 ACDF 3/15/has screws and plate in neck   TONSILLECTOMY  TRIGGER FINGER RELEASE Left 03/08/2016   Procedure: LEFT LONG TRIGGER RELEASE AND LEFT RING TRIGGER RELEASE;  Surgeon: Betha Loa, MD;  Location: [facility];  Service: Orthopedics;  Laterality: Left;   UPPER GASTROINTESTINAL ENDOSCOPY       HPI     Home Medications Prior to Admission medications   Medication Sig Start Date End Date Taking? Authorizing Provider  acetaminophen (TYLENOL) 500 MG tablet Take 1,000 mg by mouth at bedtime.    [provider]  Apoaequorin (PREVAGEN EXTRA STRENGTH) 20 MG CAPS Take 20 mg by mouth daily.    [provider]  aspirin EC 81 MG tablet Take 81 mg by mouth daily.    [provider]  Calcium Carbonate Antacid (TUMS PO) Take 1 tablet by mouth daily as needed (acid reflux).    [provider]  citalopram (CELEXA) 20 MG tablet TAKE 1 TABLET BY MOUTH EVERY DAY Patient taking differently: Take 20 mg by mouth daily. 08/26/21   Willow Ora, MD  clonazePAM (KLONOPIN) 0.5 MG tablet TAKE 1 TABLET BY MOUTH TWICE A DAY AS NEEDED FOR ANXIETY Patient taking differently: Take 0.5 mg by mouth at bedtime. 10/02/21   Willow Ora, MD  cyclobenzaprine (FLEXERIL) 10 MG tablet Take 1 tablet (10 mg total) by mouth 3 (three) times daily as needed for muscle spasms. Patient taking differently: Take 10 mg by mouth See admin instructions. Take 10 mg by mouth at twice daily as needed for muscle spasms 07/03/18   Willow Ora, MD  diphenhydrAMINE (BENADRYL) 25 MG tablet Take 50-75 mg by mouth at bedtime as needed for sleep.    [provider]  doxycycline (VIBRA-TABS) 100 MG tablet Take 1 tablet (100 mg total) by mouth 2 (two) times daily. 12/27/21   Felecia Shelling, DPM  gabapentin (NEURONTIN) 600 MG tablet Take 2 tablets (1,200 mg total) by mouth 3 (three) times daily. Patient taking differently: Take 1,200 mg by mouth 2 (two) times daily. 05/29/21   Willow Ora, MD  ibuprofen (ADVIL,MOTRIN) 200 MG tablet Take 800 mg by mouth 2 (two) times daily as needed (back pain/sleep).    [provider]  JARDIANCE 25 MG TABS tablet TAKE 1 TABLET (25 MG TOTAL) BY MOUTH DAILY. Patient taking differently: Take 25 mg by mouth daily. 10/23/21   Willow Ora, MD  levothyroxine (SYNTHROID) 100 MCG tablet Take 1 tablet (100 mcg total) by mouth daily before breakfast. Patient taking differently: Take 100 mcg by mouth at bedtime. 10/11/21   Willow Ora, MD  lisinopril (PRINIVIL,ZESTRIL) 5 MG tablet Take 0.5 tablets (2.5 mg total) by mouth daily. 05/13/18   Willow Ora, MD  Melatonin 10 MG CAPS Take 20 mg by mouth at bedtime.    [provider]  metFORMIN  (GLUCOPHAGE) 1000 MG tablet Take 1 tablet (1,000 mg total) by mouth 2 (two) times daily with a meal. 12/06/21   Willow Ora, MD  OVER THE COUNTER MEDICATION Take 2 tablets by mouth daily. vision shield    [provider]  OVER THE COUNTER MEDICATION Apply 1 application. topically at bedtime. veterinary liniment gel    [provider]  pantoprazole (PROTONIX) 40 MG tablet TAKE 1 TABLET BY MOUTH EVERY DAY Patient taking differently: Take 40 mg by mouth daily. 07/11/21   Willow Ora, MD  pravastatin (PRAVACHOL) 40 MG tablet TAKE 1 TABLET BY MOUTH EVERYDAY AT BEDTIME Patient taking differently: Take 40 mg by mouth daily.  10/30/21   Willow Ora, MD  sulfamethoxazole-trimethoprim (BACTRIM DS) 800-160 MG tablet Take 1 tablet by mouth 2 (two) times daily. 12/29/21   Candelaria Stagers, DPM  TOUJEO SOLOSTAR 300 UNIT/ML Solostar Pen INJECT 45 UNITS INTO THE SKIN AT BEDTIME. Patient taking differently: Inject 30-40 Units into the skin at bedtime. 11/09/21   Willow Ora, MD      Allergies    Toradol [ketorolac tromethamine], Doxycycline, Baclofen, Morphine and related, Penicillins, and Talwin [pentazocine]    Review of Systems   Review of Systems  Constitutional:  Negative for chills and fever.  HENT:  Negative for facial swelling and trouble swallowing.   Eyes:  Negative for photophobia and visual disturbance.  Respiratory:  Negative for cough and shortness of breath.   Cardiovascular:  Negative for chest pain and palpitations.  Gastrointestinal:  Negative for abdominal pain, nausea and vomiting.  Endocrine: Negative for polydipsia and polyuria.  Genitourinary:  Negative for difficulty urinating and hematuria.  Musculoskeletal:  Positive for arthralgias and gait problem. Negative for joint swelling.  Skin:  Positive for wound. Negative for pallor and rash.  Neurological:  Negative for syncope and headaches.  Psychiatric/Behavioral:  Negative for agitation and confusion.      Physical Exam Updated Vital Signs BP 128/75   Pulse 77   Temp 98.4 F (36.9 C) (Oral)   Resp 18   SpO2 100%  Physical Exam Vitals and nursing note reviewed.  Constitutional:      General: She is not in acute distress.    Appearance: Normal appearance. She is not ill-appearing or diaphoretic.  HENT:     Head: Normocephalic and atraumatic.     Right Ear: External ear normal.     Left Ear: External ear normal.     Nose: Nose normal.     Mouth/Throat:     Mouth: Mucous membranes are moist.  Eyes:     General: No scleral icterus.       Right eye: No discharge.        Left eye: No discharge.  Cardiovascular:     Rate and Rhythm: Normal rate and regular rhythm.     Pulses: Normal pulses.     Heart sounds: Normal heart sounds.  Pulmonary:     Effort: Pulmonary effort is normal. No respiratory distress.     Breath sounds: Normal breath sounds.  Abdominal:     General: Abdomen is flat.     Palpations: Abdomen is soft.     Tenderness: There is no abdominal tenderness.  Musculoskeletal:        General: Normal range of motion.     Cervical back: Normal range of motion.     Right lower leg: No edema.     Left lower leg: No edema.  Feet:     Comments: Erythema to lateral margin or left foot distally, see photo No crepitus, no drainage from wounds. Tender palpation along dorsum and distal portion of left foot.  Sensation intact distally.  DP pulses palpable equal bilateral Skin:    General: Skin is warm and dry.     Capillary Refill: Capillary refill takes less than 2 seconds.  Neurological:     Mental Status: She is alert and oriented to person, place, and time.     GCS: GCS eye subscore is 4. GCS verbal subscore is 5. GCS motor subscore is 6.  Psychiatric:        Mood and Affect: Mood normal.  Behavior: Behavior normal.          ED Results / Procedures / Treatments   Labs (all labs ordered are listed, but only abnormal results are displayed) Labs Reviewed   CBC WITH DIFFERENTIAL/PLATELET - Abnormal; Notable for the following components:      Result Value   Hemoglobin 11.8 (*)    MCH 25.7 (*)    RDW 16.8 (*)    All other components within normal limits  BASIC METABOLIC PANEL - Abnormal; Notable for the following components:   Glucose, Bld 231 (*)    All other components within normal limits  LACTIC ACID, PLASMA - Abnormal; Notable for the following components:   Lactic Acid, Venous 2.6 (*)    All other components within normal limits  LACTIC ACID, PLASMA    EKG None  Radiology DG Foot Complete Left  Result Date: 01/10/2022 CLINICAL DATA:  Foot pain following surgery EXAM: LEFT FOOT - COMPLETE 3+ VIEW COMPARISON:  01/03/2022, 12/27/2021, 11/22/2021 FINDINGS: Stable screw fixation of the first proximal phalanx across fracture or osteotomy without significant interval bridging callus. Surgical plate and multiple screw fixation across the first second and third TMT joints. Multiple calcific densities or osseous fragments between the bases of the first and second metatarsals as before. Postsurgical changes at the head of the first metatarsal. Question mild resorptive change at the lateral base of first metatarsal. Persistent soft tissue swelling without emphysema. Possible small cortical erosion at the head of the first metatarsal. IMPRESSION: 1. Postsurgical changes of the first metatarsal and first through third TMT joints with similar positioning of orthopedic hardware. 2. Stable alignment of fracture osteotomy of the first proximal phalanx without significant interval bridging callus 3. Question mild resorptive changes along the lateral base of first metatarsal and possible small erosion at the head of the first metatarsal, question secondary to infection/osteomyelitis versus posttraumatic. Electronically Signed   By: Jasmine PangKim  Fujinaga M.D.   On: 01/10/2022 17:15    Procedures Procedures  {Document cardiac monitor, telemetry assessment procedure  when appropriate:1}  Medications Ordered in ED Medications - No data to display  ED Course/ Medical Decision Making/ A&P                           Medical Decision Making Risk Prescription drug management.    CC: Left foot pain  This patient presents to the Emergency Department for the above complaint. This involves an extensive number of treatment options and is a complaint that carries with it a high risk of complications and morbidity. Vital signs were reviewed. Serious etiologies considered.  DDx includes not limited to osteomyelitis, cellulitis, necrotizing fasciitis, MSK, sprain, strain, superficial infection, fracture, other acute etiologies are considered  Record review:  Previous records obtained and reviewed  Recent upper evaluation, podiatry office notes, prior labs and imaging  Additional history obtained from daughter in law  Medical and surgical history as noted above.   Work up as above, notable for:  Labs & imaging results that were available during my care of the patient were visualized by me and considered in my medical decision making.   I ordered imaging studies which included foot left x-ray. I visualized the imaging, interpreted images, and I agree with radiologist interpretation.  Concerning for osteomyelitis of lateral base of first metatarsal and head of first metatarsal.    Personally discussed patient care with consultant; Dr. Ardelle AntonWagoner podiatry; recommends admission to hospitalist service, plan for surgery tomorrow.  Hold antibiotics at this time so we can obtain biopsy during surgery tomorrow.  Management: Analgesics, IV fluids  ED Course:     Reassessment:  Symptoms improved following intervention.  Admission was considered.                Social determinants of health include -  Social History   Socioeconomic History   Marital status: Married    Spouse name: Not on file   Number of children: 1   Years of education: Not on  file   Highest education level: Not on file  Occupational History   Occupation: Nurse  Tobacco Use   Smoking status: Never   Smokeless tobacco: Never  Vaping Use   Vaping Use: Never used  Substance and Sexual Activity   Alcohol use: Not Currently   Drug use: No   Sexual activity: Not Currently    Birth control/protection: Post-menopausal  Other Topics Concern   Not on file  Social History Narrative   Separated. Son lives with her.    On Disability--secondary to back. On disability since 2012.   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    Never smoked.    Social Determinants of Health   Financial Resource Strain: Not on file  Food Insecurity: Not on file  Transportation Needs: Not on file  Physical Activity: Not on file  Stress: Not on file  Social Connections: Not on file  Intimate Partner Violence: Not on file      This chart was dictated using voice recognition software.  Despite best efforts to proofread,  errors can occur which can change the documentation meaning.   {Document critical care time when appropriate:1} {Document review of labs and clinical decision tools ie heart score, Chads2Vasc2 etc:1}  {Document your independent review of radiology images, and any outside records:1} {Document your discussion with family members, caretakers, and with consultants:1} {Document social determinants of health affecting pt's care:1} {Document your decision making why or why not admission, treatments were needed:1} Final Clinical Impression(s) / ED Diagnoses Final diagnoses:  None    Rx / DC Orders ED Discharge Orders     None

## 2022-01-10 NOTE — Progress Notes (Signed)
Consult received. I have ordered bone scan to further evaluate for osteomyelitis. On x-ray it could be infection but also post-surgical changes. Also ordered ESR, CRP, uric acid level.   I will see the patient tomorrow. If clinically stable would hold antibiotics for now in case of bone biopsy but I would like to get the above testing done prior to see if needed.

## 2022-01-11 ENCOUNTER — Observation Stay (HOSPITAL_COMMUNITY): Payer: Medicare HMO

## 2022-01-11 ENCOUNTER — Encounter (HOSPITAL_BASED_OUTPATIENT_CLINIC_OR_DEPARTMENT_OTHER): Payer: Medicare HMO | Admitting: General Surgery

## 2022-01-11 DIAGNOSIS — E1169 Type 2 diabetes mellitus with other specified complication: Secondary | ICD-10-CM | POA: Diagnosis not present

## 2022-01-11 DIAGNOSIS — E1159 Type 2 diabetes mellitus with other circulatory complications: Secondary | ICD-10-CM | POA: Diagnosis not present

## 2022-01-11 DIAGNOSIS — Z811 Family history of alcohol abuse and dependence: Secondary | ICD-10-CM | POA: Diagnosis not present

## 2022-01-11 DIAGNOSIS — K2100 Gastro-esophageal reflux disease with esophagitis, without bleeding: Secondary | ICD-10-CM | POA: Diagnosis not present

## 2022-01-11 DIAGNOSIS — K219 Gastro-esophageal reflux disease without esophagitis: Secondary | ICD-10-CM | POA: Diagnosis not present

## 2022-01-11 DIAGNOSIS — G2581 Restless legs syndrome: Secondary | ICD-10-CM | POA: Diagnosis not present

## 2022-01-11 DIAGNOSIS — L03116 Cellulitis of left lower limb: Secondary | ICD-10-CM | POA: Diagnosis not present

## 2022-01-11 DIAGNOSIS — H3530 Unspecified macular degeneration: Secondary | ICD-10-CM | POA: Diagnosis not present

## 2022-01-11 DIAGNOSIS — E782 Mixed hyperlipidemia: Secondary | ICD-10-CM | POA: Diagnosis not present

## 2022-01-11 DIAGNOSIS — E1142 Type 2 diabetes mellitus with diabetic polyneuropathy: Secondary | ICD-10-CM | POA: Diagnosis not present

## 2022-01-11 DIAGNOSIS — E119 Type 2 diabetes mellitus without complications: Secondary | ICD-10-CM | POA: Diagnosis not present

## 2022-01-11 DIAGNOSIS — Z794 Long term (current) use of insulin: Secondary | ICD-10-CM | POA: Diagnosis not present

## 2022-01-11 DIAGNOSIS — D519 Vitamin B12 deficiency anemia, unspecified: Secondary | ICD-10-CM | POA: Diagnosis not present

## 2022-01-11 DIAGNOSIS — I152 Hypertension secondary to endocrine disorders: Secondary | ICD-10-CM | POA: Diagnosis not present

## 2022-01-11 DIAGNOSIS — T8141XA Infection following a procedure, superficial incisional surgical site, initial encounter: Secondary | ICD-10-CM | POA: Diagnosis not present

## 2022-01-11 DIAGNOSIS — Z86718 Personal history of other venous thrombosis and embolism: Secondary | ICD-10-CM | POA: Diagnosis not present

## 2022-01-11 DIAGNOSIS — L03119 Cellulitis of unspecified part of limb: Secondary | ICD-10-CM | POA: Diagnosis not present

## 2022-01-11 DIAGNOSIS — M79675 Pain in left toe(s): Secondary | ICD-10-CM | POA: Diagnosis not present

## 2022-01-11 DIAGNOSIS — D509 Iron deficiency anemia, unspecified: Secondary | ICD-10-CM | POA: Diagnosis not present

## 2022-01-11 DIAGNOSIS — Y848 Other medical procedures as the cause of abnormal reaction of the patient, or of later complication, without mention of misadventure at the time of the procedure: Secondary | ICD-10-CM | POA: Diagnosis not present

## 2022-01-11 DIAGNOSIS — F339 Major depressive disorder, recurrent, unspecified: Secondary | ICD-10-CM | POA: Diagnosis present

## 2022-01-11 DIAGNOSIS — E538 Deficiency of other specified B group vitamins: Secondary | ICD-10-CM | POA: Diagnosis not present

## 2022-01-11 DIAGNOSIS — R52 Pain, unspecified: Secondary | ICD-10-CM | POA: Diagnosis not present

## 2022-01-11 DIAGNOSIS — F419 Anxiety disorder, unspecified: Secondary | ICD-10-CM | POA: Diagnosis not present

## 2022-01-11 DIAGNOSIS — K5900 Constipation, unspecified: Secondary | ICD-10-CM | POA: Diagnosis not present

## 2022-01-11 DIAGNOSIS — E1165 Type 2 diabetes mellitus with hyperglycemia: Secondary | ICD-10-CM | POA: Diagnosis not present

## 2022-01-11 DIAGNOSIS — E11628 Type 2 diabetes mellitus with other skin complications: Secondary | ICD-10-CM | POA: Diagnosis not present

## 2022-01-11 DIAGNOSIS — Z808 Family history of malignant neoplasm of other organs or systems: Secondary | ICD-10-CM | POA: Diagnosis not present

## 2022-01-11 DIAGNOSIS — E8809 Other disorders of plasma-protein metabolism, not elsewhere classified: Secondary | ICD-10-CM | POA: Diagnosis not present

## 2022-01-11 DIAGNOSIS — D649 Anemia, unspecified: Secondary | ICD-10-CM | POA: Diagnosis not present

## 2022-01-11 DIAGNOSIS — E039 Hypothyroidism, unspecified: Secondary | ICD-10-CM | POA: Diagnosis not present

## 2022-01-11 DIAGNOSIS — R69 Illness, unspecified: Secondary | ICD-10-CM | POA: Diagnosis not present

## 2022-01-11 DIAGNOSIS — Z79899 Other long term (current) drug therapy: Secondary | ICD-10-CM | POA: Diagnosis not present

## 2022-01-11 DIAGNOSIS — M79672 Pain in left foot: Secondary | ICD-10-CM | POA: Diagnosis not present

## 2022-01-11 DIAGNOSIS — Z6832 Body mass index (BMI) 32.0-32.9, adult: Secondary | ICD-10-CM | POA: Diagnosis not present

## 2022-01-11 DIAGNOSIS — Z981 Arthrodesis status: Secondary | ICD-10-CM | POA: Diagnosis not present

## 2022-01-11 DIAGNOSIS — E872 Acidosis, unspecified: Secondary | ICD-10-CM | POA: Diagnosis not present

## 2022-01-11 DIAGNOSIS — M86172 Other acute osteomyelitis, left ankle and foot: Secondary | ICD-10-CM | POA: Diagnosis not present

## 2022-01-11 DIAGNOSIS — E669 Obesity, unspecified: Secondary | ICD-10-CM | POA: Diagnosis not present

## 2022-01-11 LAB — CBC
HCT: 34.4 % — ABNORMAL LOW (ref 36.0–46.0)
Hemoglobin: 10.7 g/dL — ABNORMAL LOW (ref 12.0–15.0)
MCH: 26.1 pg (ref 26.0–34.0)
MCHC: 31.1 g/dL (ref 30.0–36.0)
MCV: 83.9 fL (ref 80.0–100.0)
Platelets: 294 10*3/uL (ref 150–400)
RBC: 4.1 MIL/uL (ref 3.87–5.11)
RDW: 16.4 % — ABNORMAL HIGH (ref 11.5–15.5)
WBC: 5.3 10*3/uL (ref 4.0–10.5)
nRBC: 0 % (ref 0.0–0.2)

## 2022-01-11 LAB — COMPREHENSIVE METABOLIC PANEL
ALT: 15 U/L (ref 0–44)
AST: 16 U/L (ref 15–41)
Albumin: 3.1 g/dL — ABNORMAL LOW (ref 3.5–5.0)
Alkaline Phosphatase: 102 U/L (ref 38–126)
Anion gap: 6 (ref 5–15)
BUN: 8 mg/dL (ref 8–23)
CO2: 28 mmol/L (ref 22–32)
Calcium: 8.9 mg/dL (ref 8.9–10.3)
Chloride: 109 mmol/L (ref 98–111)
Creatinine, Ser: 0.61 mg/dL (ref 0.44–1.00)
GFR, Estimated: >60 mL/min (ref >60)
Glucose, Bld: 129 mg/dL — ABNORMAL HIGH (ref 70–99)
Potassium: 3.8 mmol/L (ref 3.5–5.1)
Sodium: 143 mmol/L (ref 135–145)
Total Bilirubin: 0.2 mg/dL — ABNORMAL LOW (ref 0.3–1.2)
Total Protein: 5.5 g/dL — ABNORMAL LOW (ref 6.5–8.1)

## 2022-01-11 LAB — GLUCOSE, CAPILLARY
Glucose-Capillary: 164 mg/dL — ABNORMAL HIGH (ref 70–99)
Glucose-Capillary: 165 mg/dL — ABNORMAL HIGH (ref 70–99)
Glucose-Capillary: 200 mg/dL — ABNORMAL HIGH (ref 70–99)
Glucose-Capillary: 80 mg/dL (ref 70–99)
Glucose-Capillary: 89 mg/dL (ref 70–99)

## 2022-01-11 LAB — C-REACTIVE PROTEIN: CRP: 0.6 mg/dL (ref <1.0)

## 2022-01-11 LAB — HIV ANTIBODY (ROUTINE TESTING W REFLEX): HIV Screen 4th Generation wRfx: NONREACTIVE

## 2022-01-11 MED ORDER — MELATONIN 3 MG PO TABS
3.0000 mg | ORAL_TABLET | Freq: Every evening | ORAL | Status: DC | PRN
Start: 2022-01-11 — End: 2022-01-18
  Administered 2022-01-12 – 2022-01-17 (×6): 3 mg via ORAL
  Filled 2022-01-11 (×6): qty 1

## 2022-01-11 MED ORDER — HYDROMORPHONE HCL 1 MG/ML IJ SOLN
0.5000 mg | Freq: Once | INTRAMUSCULAR | Status: AC
  Administered 2022-01-11: 0.5 mg via INTRAVENOUS
  Filled 2022-01-11: qty 0.5

## 2022-01-11 MED ORDER — SODIUM CHLORIDE 0.9 % IV SOLN
1.0000 g | INTRAVENOUS | Status: DC
  Administered 2022-01-11 – 2022-01-17 (×7): 1 g via INTRAVENOUS
  Filled 2022-01-11 (×8): qty 10

## 2022-01-11 MED ORDER — TECHNETIUM TC 99M MEDRONATE IV KIT
21.9000 | PACK | Freq: Once | INTRAVENOUS | Status: AC
  Administered 2022-01-11: 21.9 via INTRAVENOUS

## 2022-01-11 MED ORDER — VANCOMYCIN HCL 750 MG/150ML IV SOLN
750.0000 mg | INTRAVENOUS | Status: DC
  Administered 2022-01-12 – 2022-01-17 (×6): 750 mg via INTRAVENOUS
  Filled 2022-01-11 (×7): qty 150

## 2022-01-11 MED ORDER — LACTATED RINGERS IV SOLN: INTRAVENOUS | Status: AC

## 2022-01-11 MED ORDER — HYDROMORPHONE HCL 1 MG/ML IJ SOLN
0.5000 mg | INTRAMUSCULAR | Status: DC | PRN
  Administered 2022-01-11 – 2022-01-17 (×23): 0.5 mg via INTRAVENOUS
  Filled 2022-01-11 (×24): qty 0.5

## 2022-01-11 MED ORDER — VANCOMYCIN HCL IN DEXTROSE 1-5 GM/200ML-% IV SOLN
1000.0000 mg | Freq: Once | INTRAVENOUS | Status: AC
  Administered 2022-01-11: 1000 mg via INTRAVENOUS
  Filled 2022-01-11: qty 200

## 2022-01-11 NOTE — Consult Note (Signed)
Reason for Consult: Infection  Referring Physician: Dr. Hazeline Junkeryan Grunz, MD  Marissa Thompson is an 71 y.o. female.  HPI:  71 y.o. female with medical history significant of hypertension, diabetes, GERD, hyperlipidemia, hypothyroidism, depression, RLS, macular degeneration presenting with a week of foot pain redness and swelling. She previously had bunion surgery with Dr. Logan BoresEvans on 12/21/2021.  She followed up on December 27, 2021.  She was placed on doxycycline.  At that time she was found to have some mild redness around the incision.  States that she had increased pain.  She was prescribed pain medicine at that time.  She is placed into a surgical shoe as the cam boot is causing discomfort.  She then followed up on June 21 as an urgent work in.  Compression wrap was applied.  Patient states that the compression wrap causes quite a bit of discomfort.  She said that she has had some drainage up until about a week ago. She has had a fall since surgery apparently.   X-ray were done in the ER which was concerning for osteomyelitis. WBC is normal.  I ordered a uric acid level which was normal as well as sed rate of 11 and CRP of 0.6.  Bone scan was ordered to evaluate for possible osteomyelitis.  She is currently n.p.o. for potential surgery, bone biopsy later today.  Past Medical History:  Diagnosis Date   Allergy    Anxiety 2003   Carpal tunnel syndrome, bilateral    Cough    for over a year per pt-   Depression 1998   Diabetes mellitus without complication (HCC) 1998   Dupuytren's contracture of left hand    DVT (deep venous thrombosis) (HCC) 2007   left calf x2   GERD (gastroesophageal reflux disease) 1980   History of alcoholism (HCC)    Chronic   HLD (hyperlipidemia)    Hyperlipidemia 1990   Hypertension    Hypothyroidism 1998   Macular degeneration 07/31/2018   2020   Macular degeneration 12/2019   PONV (postoperative nausea and vomiting)    1 time per pt in 2001   Short-term memory loss     SOB (shortness of breath) on exertion     Past Surgical History:  Procedure Laterality Date   ANTERIOR CERVICAL DISCECTOMY  02/2018   BACK SURGERY     x3 Lumbar   CARPAL TUNNEL RELEASE Left 03/08/2016   Procedure: LEFT CARPAL TUNNEL RELEASE;  Surgeon: Betha LoaKevin Kuzma, MD;  Location: [facility];  Service: Orthopedics;  Laterality: Left;   CATARACT EXTRACTION Bilateral 6/21-7/21   CERVICAL POLYPECTOMY N/A 08/17/2013   Procedure: CERVICAL POLYPECTOMY;  Surgeon: Mickel Baasichard D Kaplan, MD;  Location: [facility];  Service: Gynecology;  Laterality: N/A;   COLON SURGERY  2007   benign mass   Dupuytren's release Left 2017   ECTOPIC PREGNANCY SURGERY  1979   ESOPHAGEAL DILATION     x3   EYE SURGERY     FOOT ARTHRODESIS Left 12/21/2021   Procedure: ARTHRODESIS LIS FRANC;  Surgeon: Felecia ShellingEvans, Brent M, DPM;  Location: [facility];  Service: Podiatry;  Laterality: Left;   HALLUX VALGUS LAPIDUS Left 12/21/2021   Procedure: HALLUX VALGUS LAPIDUS;  Surgeon: Felecia ShellingEvans, Brent M, DPM;  Location: [facility];  Service: Podiatry;  Laterality: Left;   HYSTEROSCOPY WITH D & C N/A 08/17/2013   Procedure: DILATATION AND CURETTAGE /HYSTEROSCOPY;  Surgeon: Mickel Baasichard D Kaplan, MD;  Location: [facility];  Service: Gynecology;  Laterality: N/A;  YAG LASER  NECK SURGERY     c5-7 ACDF 3/15/has screws and plate in neck   TONSILLECTOMY     TRIGGER FINGER RELEASE Left 03/08/2016   Procedure: LEFT LONG TRIGGER RELEASE AND LEFT RING TRIGGER RELEASE;  Surgeon: Betha Loa, MD;  Location: [facility];  Service: Orthopedics;  Laterality: Left;   UPPER GASTROINTESTINAL ENDOSCOPY      Family History  Problem Relation Age of Onset   Depression Mother    Diabetes Mother    Hypertension Mother    Alcohol abuse Father    Arthritis Father    Cancer Father 29       Oral Cancer- had tongue, jaw resection--smoker and alcohol   Alcohol abuse Brother     Social History:  reports that she has never smoked. She has never used smokeless  tobacco. She reports that she does not currently use alcohol. She reports that she does not use drugs.  Allergies:  Allergies  Allergen Reactions   Toradol [Ketorolac Tromethamine] Other (See Comments)    Slurred speech, confusion   Doxycycline Nausea And Vomiting   Baclofen Other (See Comments)    Memory Loss and shakes   Morphine And Related Itching   Penicillins Rash    Has patient had a PCN reaction causing immediate rash, facial/tongue/throat swelling, SOB or lightheadedness with hypotension: Yes Has patient had a PCN reaction causing severe rash involving mucus membranes or skin necrosis: No Has patient had a PCN reaction that required hospitalization: No Has patient had a PCN reaction occurring within the last 10 years: No If all of the above answers are "NO", then may proceed with Cephalosporin use.    Talwin [Pentazocine] Itching    Medications: I have reviewed the patient's current medications.  Results for orders placed or performed during the hospital encounter of 01/10/22 (from the past 48 hour(s))  CBC with Differential     Status: Abnormal   Collection Time: 01/10/22  4:33 PM  Result Value Ref Range   WBC 4.1 4.0 - 10.5 K/uL   RBC 4.59 3.87 - 5.11 MIL/uL   Hemoglobin 11.8 (L) 12.0 - 15.0 g/dL   HCT 24.5 80.9 - 98.3 %   MCV 84.7 80.0 - 100.0 fL   MCH 25.7 (L) 26.0 - 34.0 pg   MCHC 30.3 30.0 - 36.0 g/dL   RDW 38.2 (H) 50.5 - 39.7 %   Platelets 335 150 - 400 K/uL   nRBC 0.0 0.0 - 0.2 %   Neutrophils Relative % 59 %   Neutro Abs 2.4 1.7 - 7.7 K/uL   Lymphocytes Relative 30 %   Lymphs Abs 1.2 0.7 - 4.0 K/uL   Monocytes Relative 8 %   Monocytes Absolute 0.3 0.1 - 1.0 K/uL   Eosinophils Relative 2 %   Eosinophils Absolute 0.1 0.0 - 0.5 K/uL   Basophils Relative 1 %   Basophils Absolute 0.0 0.0 - 0.1 K/uL   Immature Granulocytes 0 %   Abs Immature Granulocytes 0.01 0.00 - 0.07 K/uL    Comment: Performed at [facility], [address],  [place], [place] [address]  Basic metabolic panel     Status: Abnormal   Collection Time: 01/10/22  4:33 PM  Result Value Ref Range   Sodium 137 135 - 145 mmol/L   Potassium 4.1 3.5 - 5.1 mmol/L   Chloride 104 98 - 111 mmol/L   CO2 24 22 - 32 mmol/L   Glucose, Bld 231 (H) 70 - 99 mg/dL  Comment: Glucose reference range applies only to samples taken after fasting for at least 8 hours.   BUN 10 8 - 23 mg/dL   Creatinine, Ser 8.92 0.44 - 1.00 mg/dL   Calcium 9.0 8.9 - 11.9 mg/dL   GFR, Estimated >41 >74 mL/min    Comment: (NOTE) Calculated using the CKD-EPI Creatinine Equation (2021)    Anion gap 9 5 - 15    Comment: Performed at [facility], [address] [place], [place] [address]  Lactic acid, plasma     Status: Abnormal   Collection Time: 01/10/22  4:33 PM  Result Value Ref Range   Lactic Acid, Venous 2.6 (HH) 0.5 - 1.9 mmol/L    Comment: CRITICAL RESULT CALLED TO, READ BACK BY AND VERIFIED WITH:  DOWD,P RN @1740  01/10/22 EDENSCA Performed at [facility], [address] [place], [place]   Lactic acid, plasma     Status: Abnormal   Collection Time: 01/10/22  7:48 PM  Result Value Ref Range   Lactic Acid, Venous 2.4 (HH) 0.5 - 1.9 mmol/L    Comment: CRITICAL RESULT CALLED TO, READ BACK BY AND VERIFIED WITH: CALLED TO HARRIS,A ON 01/10/22 AT 2127 BY VAZQUEZ,J Performed at [facility], [address], [place], [place]   Sedimentation rate     Status: None   Collection Time: 01/10/22 10:16 PM  Result Value Ref Range   Sed Rate 11 0 - 22 mm/hr    Comment: Performed at [facility], [address] [place], [place]  C-reactive protein     Status: None   Collection Time: 01/10/22 10:16 PM  Result Value Ref Range   CRP 0.6 <1.0 mg/dL    Comment: Performed at [facility], [address], [place], [place]  Uric acid     Status: None   Collection Time: 01/10/22  10:16 PM  Result Value Ref Range   Uric Acid, Serum 4.4 2.5 - 7.1 mg/dL    Comment: Performed at [facility], [address], [place], [place]  HIV Antibody (routine testing w rflx)     Status: None   Collection Time: 01/10/22 10:16 PM  Result Value Ref Range   HIV Screen 4th Generation wRfx Non Reactive Non Reactive    Comment: Performed at [facility], [address], [place], [place]  Glucose, capillary     Status: Abnormal   Collection Time: 01/11/22 12:05 AM  Result Value Ref Range   Glucose-Capillary 165 (H) 70 - 99 mg/dL    Comment: Glucose reference range applies only to samples taken after fasting for at least 8 hours.  Comprehensive metabolic panel     Status: Abnormal   Collection Time: 01/11/22  4:09 AM  Result Value Ref Range   Sodium 143 135 - 145 mmol/L   Potassium 3.8 3.5 - 5.1 mmol/L   Chloride 109 98 - 111 mmol/L   CO2 28 22 - 32 mmol/L   Glucose, Bld 129 (H) 70 - 99 mg/dL    Comment: Glucose reference range applies only to samples taken after fasting for at least 8 hours.   BUN 8 8 - 23 mg/dL   Creatinine, Ser 01/13/22 0.44 - 1.00 mg/dL   Calcium 8.9 8.9 - 6.76 mg/dL   Total Protein 5.5 (L) 6.5 - 8.1 g/dL   Albumin 3.1 (L) 3.5 - 5.0 g/dL   AST 16 15 -  41 U/L   ALT 15 0 - 44 U/L   Alkaline Phosphatase 102 38 - 126 U/L   Total Bilirubin 0.2 (L) 0.3 - 1.2 mg/dL   GFR, Estimated >81 >85 mL/min    Comment: (NOTE) Calculated using the CKD-EPI Creatinine Equation (2021)    Anion gap 6 5 - 15    Comment: Performed at [facility], [address] [place], [place] [address]  CBC     Status: Abnormal   Collection Time: 01/11/22  4:09 AM  Result Value Ref Range   WBC 5.3 4.0 - 10.5 K/uL   RBC 4.10 3.87 - 5.11 MIL/uL   Hemoglobin 10.7 (L) 12.0 - 15.0 g/dL   HCT 70.2 (L) 63.7 - 85.8 %   MCV 83.9 80.0 - 100.0 fL   MCH 26.1 26.0 - 34.0 pg   MCHC 31.1 30.0 - 36.0 g/dL   RDW 85.0 (H) 27.7 - 41.2 %    Platelets 294 150 - 400 K/uL   nRBC 0.0 0.0 - 0.2 %    Comment: Performed at [facility], [address] [place], [place] [address]  Glucose, capillary     Status: None   Collection Time: 01/11/22  9:22 AM  Result Value Ref Range   Glucose-Capillary 89 70 - 99 mg/dL    Comment: Glucose reference range applies only to samples taken after fasting for at least 8 hours.  Glucose, capillary     Status: None   Collection Time: 01/11/22 11:44 AM  Result Value Ref Range   Glucose-Capillary 80 70 - 99 mg/dL    Comment: Glucose reference range applies only to samples taken after fasting for at least 8 hours.    DG Foot Complete Left  Result Date: 01/10/2022 CLINICAL DATA:  Foot pain following surgery EXAM: LEFT FOOT - COMPLETE 3+ VIEW COMPARISON:  01/03/2022, 12/27/2021, 11/22/2021 FINDINGS: Stable screw fixation of the first proximal phalanx across fracture or osteotomy without significant interval bridging callus. Surgical plate and multiple screw fixation across the first second and third TMT joints. Multiple calcific densities or osseous fragments between the bases of the first and second metatarsals as before. Postsurgical changes at the head of the first metatarsal. Question mild resorptive change at the lateral base of first metatarsal. Persistent soft tissue swelling without emphysema. Possible small cortical erosion at the head of the first metatarsal. IMPRESSION: 1. Postsurgical changes of the first metatarsal and first through third TMT joints with similar positioning of orthopedic hardware. 2. Stable alignment of fracture osteotomy of the first proximal phalanx without significant interval bridging callus 3. Question mild resorptive changes along the lateral base of first metatarsal and possible small erosion at the head of the first metatarsal, question secondary to infection/osteomyelitis versus posttraumatic. Electronically Signed   By: Jasmine Pang M.D.   On: 01/10/2022  17:15    Review of Systems Blood pressure 140/68, pulse 71, temperature 98 F (36.7 C), temperature source Oral, resp. rate 20, height 4\' 11"  (1.499 m), weight 72.1 kg, SpO2 97 %. Physical Exam General: AAO x3, NAD  Dermatological: Incisions healing and there is currently no drainage.  Some scabbing is present.  There is mild erythema edema is present.  There is no fluctuation crepitation but there is no malodor.  Vascular: Dorsalis Pedis artery and Posterior Tibial artery pedal pulses are 2/4 bilateral with immedate capillary fill time. There is no pain with calf compression, swelling, warmth, erythema.   Neruologic: Grossly intact via light touch bilateral.  Musculoskeletal: Mild discomfort in surgical site.  Assessment/Plan: Postop infection, possible osteomyelitis  Although x-rays were concerned for osteomyelitis blood work is within normal limits.  Given normal CRP and sed rate I doubt deeper infection, osteomyelitis but ordered a bone scan to further evaluate this.  Physical first part of this done already.  To the second part at 3:00.  I discussed with her that if the bone scan is inconclusive or concerning for osteomyelitis we will proceed with bone biopsy hopefully later today.  We discussed the surgery as well as postoperative course.  I still would hold antibiotics until surgery determination is made for potential bone biopsy.  Clinically she stable at this point but will reevaluate if any symptoms were to change.  Podiatry will continue to follow.  Ovid Curd, DPM  Vivi Barrack 01/11/2022, 1:15 PM

## 2022-01-11 NOTE — TOC Initial Note (Signed)
Transition of Care [facility]) - Initial/Assessment Note    Patient Details  Name: Marissa Thompson MRN: [id number] Date of Birth: Dec 14, 1950  Transition of Care [facility]) CM/SW Contact:    Golda Acre, RN Phone Number: 01/11/2022, 8:40 AM  Clinical Narrative:                  Transition of Care [facility]) Screening Note   Patient Details  Name: Marissa Thompson Date of Birth: [birth year]   Transition of Care [facility]) CM/SW Contact:    Golda Acre, RN Phone Number: 01/11/2022, 8:40 AM    Transition of Care Department [facility]) has reviewed patient and no TOC needs have been identified at this time. We will continue to monitor patient advancement through interdisciplinary progression rounds. If new patient transition needs arise, please place a TOC consult.    Expected Discharge Plan: Home w Home Health Services Barriers to Discharge: Continued Medical Work up   Patient Goals and CMS Choice Patient states their goals for this hospitalization and ongoing recovery are:: to go home CMS Medicare.gov Compare Post Acute Care list provided to:: Patient    Expected Discharge Plan and Services Expected Discharge Plan: Home w Home Health Services   Discharge Planning Services: CM Consult   Living arrangements for the past 2 months: Single Family Home                                      Prior Living Arrangements/Services Living arrangements for the past 2 months: Single Family Home Lives with:: Spouse Patient language and need for interpreter reviewed:: Yes Do you feel safe going back to the place where you live?: Yes      Need for Family Participation in Patient Care: Yes (Comment) Care giver support system in place?: Yes (comment)   Criminal Activity/Legal Involvement Pertinent to Current Situation/Hospitalization: No - Comment as needed  Activities of Daily Living Home Assistive Devices/Equipment: CBG Meter, Eyeglasses, Dentures (specify type) ADL Screening  (condition at time of admission) Patient's cognitive ability adequate to safely complete daily activities?: Yes Is the patient deaf or have difficulty hearing?: No Does the patient have difficulty seeing, even when wearing glasses/contacts?: No Does the patient have difficulty concentrating, remembering, or making decisions?: No Patient able to express need for assistance with ADLs?: Yes Does the patient have difficulty dressing or bathing?: No Independently performs ADLs?: Yes (appropriate for developmental age) Does the patient have difficulty walking or climbing stairs?: Yes Weakness of Legs: Left Weakness of Arms/Hands: None  Permission Sought/Granted                  Emotional Assessment Appearance:: Appears stated age     Orientation: : Oriented to Self, Oriented to Place, Oriented to  Time, Oriented to Situation Alcohol / Substance Use: Not Applicable Psych Involvement: No (comment)  Admission diagnosis:  Osteomyelitis (HCC) [M86.9] Osteomyelitis of left foot, unspecified type [facility]) [M86.9] Patient Active Problem List   Diagnosis Date Noted   Osteomyelitis (HCC) 01/10/2022   Restless leg syndrome 08/08/2018   Macular degeneration 07/31/2018   Seborrheic dermatitis of scalp 05/13/2018   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   History of alcoholism (HCC) 04/28/2018   S/P cervical discectomy 04/28/2018   Eczema of both external ears 12/19/2017   Major depression, recurrent, chronic (HCC) 08/29/2017   Carpal tunnel syndrome on right 08/29/2016   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/2016   Dupuytren's contracture of  left hand 12/21/2015   Type 2 diabetes mellitus without complications (HCC) 03/09/2015   Memory loss 03/08/2015   Dysphagia, pharyngoesophageal phase 12/29/2014   Vitamin D deficiency 03/03/2014   Allergy    Hypertension associated with diabetes (HCC)    GERD (gastroesophageal reflux disease)    Mixed hyperlipidemia    Hypothyroidism    PCP:  Willow Ora,  MD Pharmacy:   [facility] [phone] [place], [place] - [address] [place] [address] Phone: [phone] Fax: [phone]  [facility] [phone] [place], [place] - [address] [place] [address] Phone: [phone] Fax: [phone]     Social Determinants of Health (SDOH) Interventions    Readmission Risk Interventions     No data to display

## 2022-01-11 NOTE — Progress Notes (Signed)
Pharmacy Antibiotic Note  Marissa Thompson is a 71 y.o. female admitted on 01/10/2022 with cellulitis.  Pharmacy has been consulted for Vanco dosing.  Active Problem(s): week of foot pain redness and swelling. She was admitted for concern of postoperative infection vs. osteomyelitis with bone scan and podiatry consult pending.  PMH: hypertension, diabetes, GERD, hyperlipidemia, hypothyroidism, depression, RLS, macular degeneration, bunionectomy 6/8  ID: Cellulitis. postoperative infection vs. Osteomyelitis. - Afebrile, WBC WNL, Scr <1  Vanco 6/29>> Rocephin 6/29>>  Plan: Vanco 1000mg  IV q24h Vancomycin 750 mg IV Q 24 hrs. Goal AUC 400-550. Expected AUC: 473 SCr used: 0.8    Height: 4\' 11"  (149.9 cm) Weight: 72.1 kg (158 lb 15.2 oz) IBW/kg (Calculated) : 43.2  Temp (24hrs), Avg:98.2 F (36.8 C), Min:97.9 F (36.6 C), Max:98.4 F (36.9 C)  Recent Labs  Lab 01/10/22 1633 01/10/22 1948 01/11/22 0409  WBC 4.1  --  5.3  CREATININE 0.63  --  0.61  LATICACIDVEN 2.6* 2.4*  --     Estimated Creatinine Clearance: 57.4 mL/min (by C-G formula based on SCr of 0.61 mg/dL).    Allergies  Allergen Reactions   Toradol [Ketorolac Tromethamine] Other (See Comments)    Slurred speech, confusion   Doxycycline Nausea And Vomiting   Baclofen Other (See Comments)    Memory Loss and shakes   Morphine And Related Itching   Penicillins Rash    Has patient had a PCN reaction causing immediate rash, facial/tongue/throat swelling, SOB or lightheadedness with hypotension: Yes Has patient had a PCN reaction causing severe rash involving mucus membranes or skin necrosis: No Has patient had a PCN reaction that required hospitalization: No Has patient had a PCN reaction occurring within the last 10 years: No If all of the above answers are "NO", then may proceed with Cephalosporin use.    Talwin [Pentazocine] Itching    [name] S. 01/12/22, PharmD, BCPS Clinical Staff  Pharmacist [email or web address]  01/13/22 01/11/2022 6:45 PM

## 2022-01-11 NOTE — Progress Notes (Signed)
I met with the patient tonight and we reviewed at length the bone scan results and all of her testing. Based on her blood work I doubt osteomyelitis at this time and it is likely post-operative changes. She also reported a fall that occurred last week which also could have lead to some of the changes.   We are going to start IV antibiotics for soft tissue infection and will discharge with oral.   Patient expressed concern about going home and something happening. She has fallen out of her bed 2 times. We will have PT come see her tomorrow. If she goes home will likely need home PT but will defer to PT evaluation tomorrow. We discussed they will evaluate to see about any needs she may have.   She is WB in surgical shoe. I would like for her to be in a boot, but she states "it killed me". She has concern about moving her toes and she does not have normal ROM. We discussed right now we need to get the bones to heal before doing aggressive PT on the foot.   I answered all of her questions and she had no further questions or concerns for me. Upon discharge she will need close follow up.

## 2022-01-11 NOTE — Progress Notes (Signed)
Progress Note  Patient: Marissa Thompson VQM:[id number] DOB: 09-06-1950  DOA: 01/10/2022  DOS: 01/11/2022    Brief hospital course: Marissa Thompson is a 71 y.o. female with medical history significant of hypertension, diabetes, GERD, hyperlipidemia, hypothyroidism, depression, RLS, macular degeneration, bunionectomy 6/8 presenting with a week of foot pain redness and swelling. She was admitted for concern of postoperative infection vs. osteomyelitis with bone scan and podiatry consult pending.  Assessment and Plan: Postoperative infection, possible osteomyelitis: Systemic inflammatory markers thus far are reassuring, bone scan pending. Per discussion with podiatry, If it is negative then would like to give her a day of IV antibiotics then send home with oral. If positive or need to do biopsy will do it tomorrow. Recommends still holding antibiotics to improve yield of potential biopsy. I will make her NPO p MN. Continue pain control as ordered.  Hypertension - Continue home lisinopril  Hyperlipidemia - Continue home pravastatin  Depression Anxiety - Continue home celexa and as needed clonazepam  RLS - Continue home gabapentin  Hypothyroidism - Continue home synthroid  IDT2DM:  - Continue with 30 units long-acting insulin (45u as outpatient but will be NPO tomorrow) and SSI  GERD - Continue home PPI  Obesity: Estimated body mass index is 32.1 kg/m as calculated from the following:   Height as of this encounter: 4\' 11"  (1.499 m).   Weight as of this encounter: 72.1 kg.  Subjective: Pain in left foot is moderate-severe, stable. No fever.   Objective: Vitals:   01/11/22 0500 01/11/22 0615 01/11/22 1028 01/11/22 1446  BP:  (!) 111/55 140/68 123/61  Pulse:  72 71 76  Resp:  18 20 20   Temp:  97.9 F (36.6 C) 98 F (36.7 C) 98.3 F (36.8 C)  TempSrc:  Oral Oral Oral  SpO2:  95% 97% 100%  Weight: 72.1 kg     Height:       Gen:  71 y.o. female in no distress Pulm:  Nonlabored breathing room air. Clear CV: Regular rate and rhythm. No murmur, rub, or gallop. No JVD, no dependent edema. GI: Abdomen soft, non-tender, non-distended, with normoactive bowel sounds.  Ext: Warm, dry Skin: No new rashes, lesions or ulcers on visualized skin. Erythematous dorsal forefoot with healed surgical scars as pictured below. Neuro: Alert and oriented. No focal neurological deficits. Psych: Judgement and insight appear fair. Mood euthymic & affect congruent. Behavior is appropriate.       Data Personally reviewed: CBC: Recent Labs  Lab 01/10/22 1633 01/11/22 0409  WBC 4.1 5.3  NEUTROABS 2.4  --   HGB 11.8* 10.7*  HCT 38.9 34.4*  MCV 84.7 83.9  PLT 335 294   Basic Metabolic Panel: Recent Labs  Lab 01/10/22 1633 01/11/22 0409  NA 137 143  K 4.1 3.8  CL 104 109  CO2 24 28  GLUCOSE 231* 129*  BUN 10 8  CREATININE 0.63 0.61  CALCIUM 9.0 8.9   GFR: Estimated Creatinine Clearance: 57.4 mL/min (by C-G formula based on SCr of 0.61 mg/dL). Liver Function Tests: Recent Labs  Lab 01/11/22 0409  AST 16  ALT 15  ALKPHOS 102  BILITOT 0.2*  PROT 5.5*  ALBUMIN 3.1*   No results for input(s): "LIPASE", "AMYLASE" in the last 168 hours. No results for input(s): "AMMONIA" in the last 168 hours. Coagulation Profile: No results for input(s): "INR", "PROTIME" in the last 168 hours. Cardiac Enzymes: No results for input(s): "CKTOTAL", "CKMB", "CKMBINDEX", "TROPONINI" in the last 168 hours. BNP (last  3 results) No results for input(s): "PROBNP" in the last 8760 hours. HbA1C: No results for input(s): "HGBA1C" in the last 72 hours. CBG: Recent Labs  Lab 01/11/22 0005 01/11/22 0922 01/11/22 1144 01/11/22 1710  GLUCAP 165* 89 80 164*   Lipid Profile: No results for input(s): "CHOL", "HDL", "LDLCALC", "TRIG", "CHOLHDL", "LDLDIRECT" in the last 72 hours. Thyroid Function Tests: No results for input(s): "TSH", "T4TOTAL", "FREET4", "T3FREE", "THYROIDAB" in  the last 72 hours. Anemia Panel: No results for input(s): "VITAMINB12", "FOLATE", "FERRITIN", "TIBC", "IRON", "RETICCTPCT" in the last 72 hours. Urine analysis:    Component Value Date/Time   COLORURINE YELLOW 07/13/2020 1408   APPEARANCEUR Sl Cloudy (A) 07/13/2020 1408   LABSPEC <=1.005 (A) 07/13/2020 1408   PHURINE 6.0 07/13/2020 1408   GLUCOSEU >=1000 (A) 07/13/2020 1408   HGBUR MODERATE (A) 07/13/2020 1408   BILIRUBINUR NEGATIVE 07/13/2020 1408   BILIRUBINUR positive 01/04/2020 1121   KETONESUR NEGATIVE 07/13/2020 1408   PROTEINUR Positive (A) 01/04/2020 1121   PROTEINUR NEGATIVE 03/21/2017 1129   UROBILINOGEN 0.2 07/13/2020 1408   NITRITE NEGATIVE 07/13/2020 1408   LEUKOCYTESUR SMALL (A) 07/13/2020 1408   No results found for this or any previous visit (from the past 240 hour(s)).   NM Bone Scan 3 Phase Lower Extremity  Result Date: 01/11/2022 CLINICAL DATA:  LEFT foot arthrodesis and great toe surgery, pain, treated for infection, question osteomyelitis EXAM: NUCLEAR MEDICINE 3-PHASE BONE SCAN TECHNIQUE: Radionuclide angiographic images, immediate static blood pool images, and 3-hour delayed static images were obtained of the feet/ankles after intravenous injection of radiopharmaceutical. RADIOPHARMACEUTICALS:  21.9 mCi Tc-72m MDP IV COMPARISON:  LEFT foot radiographs FINDINGS: Vascular phase: Increased blood flow to mid left foot and great toe Blood pool phase: Increased blood pool at mid LEFT foot and great toe Delayed phase: Minimal uptake of tracer at mid RIGHT foot likely degenerative. In LEFT foot, markedly increased uptake in mid LEFT foot at sites of surgery of of the first through third metatarsals. Additional uptake at the LEFT great toe at the distal first metatarsal and proximal phalanx at sites of prior bunionectomy and osteotomy. Observed uptake is consistent with postoperative change at both the metatarsals and the great toe. Unable to confirm/exclude osteomyelitis by  scintigraphy at this interval post surgery. IMPRESSION: Increased blood flow, blood pool, and delayed uptake of tracer at operative sites in the LEFT foot consistent with postoperative changes. Due to the recent time of surgery, unable to exclude or confirm osteomyelitis at the operative sites. Electronically Signed   By: Ulyses Southward M.D.   On: 01/11/2022 13:43   DG Foot Complete Left  Result Date: 01/10/2022 CLINICAL DATA:  Foot pain following surgery EXAM: LEFT FOOT - COMPLETE 3+ VIEW COMPARISON:  01/03/2022, 12/27/2021, 11/22/2021 FINDINGS: Stable screw fixation of the first proximal phalanx across fracture or osteotomy without significant interval bridging callus. Surgical plate and multiple screw fixation across the first second and third TMT joints. Multiple calcific densities or osseous fragments between the bases of the first and second metatarsals as before. Postsurgical changes at the head of the first metatarsal. Question mild resorptive change at the lateral base of first metatarsal. Persistent soft tissue swelling without emphysema. Possible small cortical erosion at the head of the first metatarsal. IMPRESSION: 1. Postsurgical changes of the first metatarsal and first through third TMT joints with similar positioning of orthopedic hardware. 2. Stable alignment of fracture osteotomy of the first proximal phalanx without significant interval bridging callus 3. Question mild resorptive changes along  the lateral base of first metatarsal and possible small erosion at the head of the first metatarsal, question secondary to infection/osteomyelitis versus posttraumatic. Electronically Signed   By: Jasmine Pang M.D.   On: 01/10/2022 17:15     Family Communication: None at bedside  Disposition: Status is: Inpatient Remains inpatient appropriate because: Possible need for surgery and IV antibiotics Planned Discharge Destination: Home once cleared by podiatry.   Tyrone Nine, MD 01/11/2022 5:40  PM Page by [email or web address]

## 2022-01-12 DIAGNOSIS — K2100 Gastro-esophageal reflux disease with esophagitis, without bleeding: Secondary | ICD-10-CM

## 2022-01-12 DIAGNOSIS — E039 Hypothyroidism, unspecified: Secondary | ICD-10-CM | POA: Diagnosis not present

## 2022-01-12 DIAGNOSIS — E1159 Type 2 diabetes mellitus with other circulatory complications: Secondary | ICD-10-CM | POA: Diagnosis not present

## 2022-01-12 DIAGNOSIS — F339 Major depressive disorder, recurrent, unspecified: Secondary | ICD-10-CM | POA: Diagnosis not present

## 2022-01-12 LAB — GLUCOSE, CAPILLARY
Glucose-Capillary: 113 mg/dL — ABNORMAL HIGH (ref 70–99)
Glucose-Capillary: 156 mg/dL — ABNORMAL HIGH (ref 70–99)
Glucose-Capillary: 143 mg/dL — ABNORMAL HIGH (ref 70–99)
Glucose-Capillary: 206 mg/dL — ABNORMAL HIGH (ref 70–99)

## 2022-01-12 MED ORDER — SENNOSIDES-DOCUSATE SODIUM 8.6-50 MG PO TABS
3.0000 | ORAL_TABLET | Freq: Every day | ORAL | Status: DC
  Filled 2022-01-12: qty 3

## 2022-01-12 NOTE — Hospital Course (Signed)
Marissa Thompson is a 71 y.o. female with medical history significant of hypertension, diabetes, GERD, hyperlipidemia, hypothyroidism, depression, RLS, macular degeneration, bunionectomy 6/8 presenting with a week of foot pain redness and swelling. She was admitted for concern of postoperative infection vs. osteomyelitis with bone scan and podiatry consult.  Podiatry evaluated and reviewed the bone scans and based on her blood work doubted that she has osteomyelitis at this time and it is secondary to most likely postoperative changes.  Patient had a fall last week and podiatry recommend starting IV antibiotics for soft tissue infection for now and then discharging on oral antibiotics.  After further review will likely change her to cefadroxil plus Bactrim.  PT evaluating and recommending SNF given her multiple falls.  We feel she would benefit from skilled nursing facility for further rehabilitative efforts.

## 2022-01-12 NOTE — Evaluation (Signed)
Physical Therapy Evaluation Patient Details Name: Marissa Thompson MRN: [id number] DOB: 02/14/1951 Today's Date: 01/12/2022  History of Present Illness     Clinical Impression  Pt presents with mobility and balance deficits due to above HPI. Pt ambulated 180 ft with RW without breaks. 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 Currently pt requires Supervision-Min guard for bed mobility and Supervision for transfers and gait. Pt will benefit from skilled therapy to address above impairments by practicing gait and increasing strength with exercises. Pt reports wanting to go to SNF upon DC from hospital. Recommending SNF, if pt is not approved for SNF, recommend HHPT. Will continue to treat in acute setting.      Recommendations for follow up therapy are one component of a multi-disciplinary discharge planning process, led by the attending physician.  Recommendations may be updated based on patient status, additional functional criteria and insurance authorization.  Follow Up Recommendations        Assistance Recommended at Discharge    Patient can return home with the following       Equipment Recommendations    Recommendations for Other Services       Functional Status Assessment       Precautions / Restrictions        Mobility  Bed Mobility Overal bed mobility: Needs Assistance Bed Mobility: Supine to Sit     Supine to sit: HOB elevated, Min guard, Supervision     General bed mobility comments: Pt relied on VC's for hand placement with bedrail to scoot to EOB. Pt able to transition Lt leg to contact floor with increased time.    Transfers Overall transfer level: Needs assistance Equipment used: Rolling walker (2 wheels) Transfers: Sit to/from Stand Sit to Stand: Supervision           General transfer comment: Pt able to hand place properly from bed to RW with no physical assist needed. Pt stood from toilet to standing without needing  VC's.     Ambulation/Gait Ambulation/Gait assistance: Supervision Gait Distance (Feet): 180 Feet Assistive device: Rolling walker (2 wheels) Gait Pattern/deviations: WFL(Within Functional Limits), Step-through pattern, Decreased stride length, Trunk flexed Gait velocity: decr     General Gait Details: Having increased time, pt needed assist for safety and ruling out possible dizziness.Otherwise ambulated well.  Stairs            Wheelchair Mobility    Modified Rankin (Stroke Patients Only)       Balance Overall balance assessment: Needs assistance Sitting-balance support: No upper extremity supported Sitting balance-Leahy Scale: Good Sitting balance - Comments: Pt sits to EOB and maintains balance.   Standing balance support: Bilateral upper extremity supported, During functional activity, Reliant on assistive device for balance Standing balance-Leahy Scale: Fair Standing balance comment: Pt relies on RW for external support as she continues to adjust WBing to Lt foot.                             Pertinent Vitals/Pain Pain Assessment Pain Assessment: 0-10 Pain Score: 7  Pain Location: Lt foot Pain Descriptors / Indicators: Aching, Discomfort, Tender Pain Intervention(s): Monitored during session    Home Living                          Prior Function  Hand Dominance        Extremity/Trunk Assessment                Communication      Cognition Arousal/Alertness: Awake/alert Behavior During Therapy: WFL for tasks assessed/performed Overall Cognitive Status: Within Functional Limits for tasks assessed                                 General Comments: Pt is sharp minded and compliant with therapy.        General Comments      Exercises General Exercises - Lower Extremity Ankle Circles/Pumps: AROM, 5 reps, Right, Seated   Assessment/Plan    PT Assessment    PT Problem List          PT Treatment Interventions      PT Goals (Current goals can be found in the Care Plan section)       Frequency       Co-evaluation               AM-PAC PT "6 Clicks" Mobility  Outcome Measure                  End of Session              Time:  -      Charges:             Lenn Cal, SPT [facility] 01/12/2022, 4:25 PM

## 2022-01-12 NOTE — Progress Notes (Addendum)
Subjective: She is doing well today.  She was able to walk with physical therapy today in the surgical shoe.  She sat in the chair today.  She had some discomfort after this but otherwise been doing well.  No fevers or chills.  She seems to be in better spirits today.  Objective: AAO x3, NAD DP/PT pulses palpable bilaterally, CRT less than 3 seconds Incision from surgery coapted.  There is some slight erythema dorsal aspect of foot and some peeling skin is present.  The erythema is blanchable.  There is no fluctuance or crepitation.  No drainage or pus noted today. No pain with calf compression, swelling, warmth, erythema  Assessment: Status post left foot surgery with improving cellulitis  Plan: I can discuss the plan with her.  Inflammatory markers are reassuring.  She still has some swelling discomfort and we discussed that she is only 3 weeks postop after surgery on her foot.  We still need to make sure limited activity, icing, elevating.  Orders for icing placed today as well as elevation.  She is pending SNF placement.  Continue IV antibiotics for now and likely discharge with oral antibiotics for a week.  Podiatry continue to follow for now.  Ovid Curd, DPM

## 2022-01-12 NOTE — Progress Notes (Signed)
PROGRESS NOTE    Marissa Thompson  LGX:[id number] DOB: 1950-12-25 DOA: 01/10/2022 PCP: Willow Ora, MD   Brief Narrative:  Marissa Thompson is a 71 y.o. female with medical history significant of hypertension, diabetes, GERD, hyperlipidemia, hypothyroidism, depression, RLS, macular degeneration, bunionectomy 6/8 presenting with a week of foot pain redness and swelling. She was admitted for concern of postoperative infection vs. osteomyelitis with bone scan and podiatry consult.  Podiatry evaluated and reviewed the bone scans and based on her blood work doubted that she has osteomyelitis at this time and it is secondary to most likely postoperative changes.  Patient had a fall last week and podiatry recommend starting IV antibiotics for soft tissue infection for now and then discharging on oral antibiotics.  After further review will likely change her to cefadroxil plus Bactrim.  PT evaluating and recommending SNF given her multiple falls.  We feel she would benefit from skilled nursing facility for further rehabilitative efforts.   Assessment and Plan: No notes have been filed under this hospital service. Service: Hospitalist  Postoperative soft tissue infection, possible osteomyelitis -Systemic inflammatory markers thus far are reassuring, bone scan done and showed "Increased blood flow, blood pool, and delayed uptake of tracer at operative sites in the LEFT foot consistent with postoperative changes. Due to the recent time of surgery, unable to exclude or confirm osteomyelitis at the operative sites."  -Per discussion with podiatry, If it is negative then would like to give her a day of IV antibiotics then send home with oral.  -If positive or need to do biopsy  but bone Scan was negative -Recommends still holding antibiotics to improve yield of potential biopsy.  -Blood cultures x2 are pending -CRP 0.6 -Lactic acid level went from 2.6 is now 2.4 -Continue pain control as ordered. -Per  podiatry she is weightbearing in a surgical shoe but they prefer to be in a boot -PT OT ordered and recommending SNF   Hypertension -Continue home Lisinopril 2.5 mg p.o. daily   Hyperlipidemia -Continue home Pravastatin 40 mg p.o. daily   Depression Anxiety - Continue home Citalopram, 20 mg po daily and as needed clonazepam 0.5 milligram p.o. every 8 hours as as needed   RLS - Continue home gabapentin 1200 mg p.o. twice daily   Hypothyroidism - Continue home levothyroxine 100 mcg p.o. nightly  Hypoalbuminemia -Patient's albumin level is 3.1 -Continue monitor and trend and repeat CMP in the a.m.  Normocytic Anemia -Patient's hemoglobin/hematocrit went from 11.8/38.9 and went to 10.7/34.4 -Check anemia panel in a.m. -Continue to monitor for signs and symptoms bleeding; no overt bleeding noted -Repeat CBC in the AM   IDT2DM:  -Continue with 30 units long-acting insulin and SSI -CBG's ranging from 80-200 -Continue to monitor blood sugars carefully and adjust insulin regimen as necessary   GERD - Continue home PPI with Pantoprazole 40 mg p.o. daily  Obesity -Complicates overall prognosis and care -Estimated body mass index is 32.24 kg/m as calculated from the following:   Height as of this encounter: 4\' 11"  (1.499 m).   Weight as of this encounter: 72.4 kg.  -Weight Loss and Dietary Counseling given  DVT prophylaxis: SCDs Start: 01/10/22 2137    Code Status: Full Code Family Communication: No family currently at bedside  Disposition Plan:  Level of care: Med-Surg Status is: Inpatient Remains inpatient appropriate because: Needs SNF and needs insurance authorization and bed availability   Consultants:  Podiatry  Procedures:  Bone scan  Antimicrobials:  Anti-infectives (From  admission, onward)    Start     Dose/Rate Route Frequency Ordered Stop   01/12/22 2000  vancomycin (VANCOREADY) IVPB 750 mg/150 mL        750 mg 150 mL/hr over 60 Minutes Intravenous  Every 24 hours 01/11/22 1844     01/11/22 1930  cefTRIAXone (ROCEPHIN) 1 g in sodium chloride 0.9 % 100 mL IVPB       Note to Pharmacy: has tolerated keflex   1 g 200 mL/hr over 30 Minutes Intravenous Every 24 hours 01/11/22 1824     01/11/22 1930  vancomycin (VANCOCIN) IVPB 1000 mg/200 mL premix        1,000 mg 200 mL/hr over 60 Minutes Intravenous  Once 01/11/22 1844 01/11/22 2024       Subjective: Seen and examined at bedside.  States that she is having pain in her foot still.  No nausea or vomiting.  States that she felt okay.  Concerned about her foot swelling and working with therapy.  No chest pain or shortness of breath.  No other concerns or complaints at this time.  Objective: Vitals:   01/11/22 1446 01/11/22 2048 01/12/22 0458 01/12/22 1317  BP: 123/61 (!) 123/52 (!) 110/57 134/63  Pulse: 76 81 78 81  Resp: 20 20 20 20   Temp: 98.3 F (36.8 C) 99.2 F (37.3 C) 98.4 F (36.9 C)   TempSrc: Oral Oral Oral   SpO2: 100% 97% 99% 97%  Weight:   72.4 kg   Height:        Intake/Output Summary (Last 24 hours) at 01/12/2022 1518 Last data filed at 01/12/2022 0501 Gross per 24 hour  Intake 1643.73 ml  Output 1000 ml  Net 643.73 ml   Filed Weights   01/10/22 2249 01/11/22 0500 01/12/22 0458  Weight: 71 kg 72.1 kg 72.4 kg   Examination: Physical Exam:  Constitutional: WN/WD obese Caucasian female currently no acute distress Respiratory: Diminished to auscultation bilaterally, no wheezing, rales, rhonchi or crackles. Normal respiratory effort and patient is not tachypenic. No accessory muscle use.  Unlabored breathing Cardiovascular: RRR, no murmurs / rubs / gallops. S1 and S2 auscultated.  Has mild lower extremity edema Abdomen: Soft, non-tender, distended secondary body habitus.  Bowel sounds positive.  GU: Deferred. Musculoskeletal: No clubbing / cyanosis of digits/nails. No joint deformity upper and lower extremities. Good ROM, no contractures. Normal strength and muscle  tone.  Unlabored breathing Skin: Left foot is little warm and erythematous as well as swollen Neurologic: CN 2-12 grossly intact with no focal deficits. Romberg sign and cerebellar reflexes not assessed.  Psychiatric: Normal judgment and insight. Alert and oriented x 3. Normal mood and appropriate affect.   Data Reviewed: I have personally reviewed following labs and imaging studies  CBC: Recent Labs  Lab 01/10/22 1633 01/11/22 0409  WBC 4.1 5.3  NEUTROABS 2.4  --   HGB 11.8* 10.7*  HCT 38.9 34.4*  MCV 84.7 83.9  PLT 335 294   Basic Metabolic Panel: Recent Labs  Lab 01/10/22 1633 01/11/22 0409  NA 137 143  K 4.1 3.8  CL 104 109  CO2 24 28  GLUCOSE 231* 129*  BUN 10 8  CREATININE 0.63 0.61  CALCIUM 9.0 8.9   GFR: Estimated Creatinine Clearance: 57.5 mL/min (by C-G formula based on SCr of 0.61 mg/dL). Liver Function Tests: Recent Labs  Lab 01/11/22 0409  AST 16  ALT 15  ALKPHOS 102  BILITOT 0.2*  PROT 5.5*  ALBUMIN 3.1*   No  results for input(s): "LIPASE", "AMYLASE" in the last 168 hours. No results for input(s): "AMMONIA" in the last 168 hours. Coagulation Profile: No results for input(s): "INR", "PROTIME" in the last 168 hours. Cardiac Enzymes: No results for input(s): "CKTOTAL", "CKMB", "CKMBINDEX", "TROPONINI" in the last 168 hours. BNP (last 3 results) No results for input(s): "PROBNP" in the last 8760 hours. HbA1C: No results for input(s): "HGBA1C" in the last 72 hours. CBG: Recent Labs  Lab 01/11/22 1144 01/11/22 1710 01/11/22 2057 01/12/22 0744 01/12/22 1133  GLUCAP 80 164* 200* 113* 156*   Lipid Profile: No results for input(s): "CHOL", "HDL", "LDLCALC", "TRIG", "CHOLHDL", "LDLDIRECT" in the last 72 hours. Thyroid Function Tests: No results for input(s): "TSH", "T4TOTAL", "FREET4", "T3FREE", "THYROIDAB" in the last 72 hours. Anemia Panel: No results for input(s): "VITAMINB12", "FOLATE", "FERRITIN", "TIBC", "IRON", "RETICCTPCT" in the last  72 hours. Sepsis Labs: Recent Labs  Lab 01/10/22 1633 01/10/22 1948  LATICACIDVEN 2.6* 2.4*    Recent Results (from the past 240 hour(s))  Blood culture (routine x 2)     Status: None (Preliminary result)   Collection Time: 01/10/22 10:16 PM   Specimen: BLOOD  Result Value Ref Range Status   Specimen Description   Final    BLOOD LEFT ANTECUBITAL Performed at [facility], [address], [place], [place] [address]    Special Requests   Final    BOTTLES DRAWN AEROBIC AND ANAEROBIC Blood Culture results may not be optimal due to an inadequate volume of blood received in culture bottles Performed at [facility], [address] [place], [place] [address]    Culture   Final    NO GROWTH 1 DAY Performed at [facility], [address] [place], [place] [address]    Report Status PENDING  Incomplete  Blood culture (routine x 2)     Status: None (Preliminary result)   Collection Time: 01/11/22  4:09 AM   Specimen: BLOOD  Result Value Ref Range Status   Specimen Description   Final    BLOOD LEFT ANTECUBITAL Performed at [facility], [address], [place], [place] [address]    Special Requests   Final    BOTTLES DRAWN AEROBIC ONLY Blood Culture adequate volume Performed at [facility], [address], [place], [place] [address]    Culture   Final    NO GROWTH 1 DAY Performed at [facility], [address] [place], [place] [address]    Report Status PENDING  Incomplete     Radiology Studies: NM Bone Scan 3 Phase Lower Extremity  Result Date: 01/11/2022 CLINICAL DATA:  LEFT foot arthrodesis and great toe surgery, pain, treated for infection, question osteomyelitis EXAM: NUCLEAR MEDICINE 3-PHASE BONE SCAN TECHNIQUE: Radionuclide angiographic images, immediate static blood pool images, and 3-hour delayed static images were obtained of the feet/ankles after intravenous injection of  radiopharmaceutical. RADIOPHARMACEUTICALS:  21.9 mCi Tc-74m MDP IV COMPARISON:  LEFT foot radiographs FINDINGS: Vascular phase: Increased blood flow to mid left foot and great toe Blood pool phase: Increased blood pool at mid LEFT foot and great toe Delayed phase: Minimal uptake of tracer at mid RIGHT foot likely degenerative. In LEFT foot, markedly increased uptake in mid LEFT foot at sites of surgery of of the first through third metatarsals. Additional uptake at the LEFT great toe at the distal first metatarsal and proximal phalanx at sites of prior bunionectomy and osteotomy. Observed uptake is consistent with postoperative  change at both the metatarsals and the great toe. Unable to confirm/exclude osteomyelitis by scintigraphy at this interval post surgery. IMPRESSION: Increased blood flow, blood pool, and delayed uptake of tracer at operative sites in the LEFT foot consistent with postoperative changes. Due to the recent time of surgery, unable to exclude or confirm osteomyelitis at the operative sites. Electronically Signed   By: Ulyses Southward M.D.   On: 01/11/2022 13:43   DG Foot Complete Left  Result Date: 01/10/2022 CLINICAL DATA:  Foot pain following surgery EXAM: LEFT FOOT - COMPLETE 3+ VIEW COMPARISON:  01/03/2022, 12/27/2021, 11/22/2021 FINDINGS: Stable screw fixation of the first proximal phalanx across fracture or osteotomy without significant interval bridging callus. Surgical plate and multiple screw fixation across the first second and third TMT joints. Multiple calcific densities or osseous fragments between the bases of the first and second metatarsals as before. Postsurgical changes at the head of the first metatarsal. Question mild resorptive change at the lateral base of first metatarsal. Persistent soft tissue swelling without emphysema. Possible small cortical erosion at the head of the first metatarsal. IMPRESSION: 1. Postsurgical changes of the first metatarsal and first through third  TMT joints with similar positioning of orthopedic hardware. 2. Stable alignment of fracture osteotomy of the first proximal phalanx without significant interval bridging callus 3. Question mild resorptive changes along the lateral base of first metatarsal and possible small erosion at the head of the first metatarsal, question secondary to infection/osteomyelitis versus posttraumatic. Electronically Signed   By: Jasmine Pang M.D.   On: 01/10/2022 17:15     Scheduled Meds:  citalopram  20 mg Oral Daily   gabapentin  1,200 mg Oral BID   insulin aspart  0-15 Units Subcutaneous TID WC   insulin aspart  0-5 Units Subcutaneous QHS   insulin glargine-yfgn  30 Units Subcutaneous QHS   levothyroxine  100 mcg Oral QHS   lisinopril  2.5 mg Oral Daily   pantoprazole  40 mg Oral Daily   pravastatin  40 mg Oral Daily   sodium chloride flush  3 mL Intravenous Q12H   Continuous Infusions:  cefTRIAXone (ROCEPHIN)  IV 1 g (01/11/22 1844)   vancomycin      LOS: 1 day   Marguerita Merles, DO [facility] Available via Epic secure chat 7am-7pm After these hours, please refer to coverage provider listed on [email or web address] 01/12/2022, 3:18 PM

## 2022-01-13 DIAGNOSIS — F339 Major depressive disorder, recurrent, unspecified: Secondary | ICD-10-CM | POA: Diagnosis not present

## 2022-01-13 DIAGNOSIS — K2100 Gastro-esophageal reflux disease with esophagitis, without bleeding: Secondary | ICD-10-CM | POA: Diagnosis not present

## 2022-01-13 DIAGNOSIS — E11628 Type 2 diabetes mellitus with other skin complications: Secondary | ICD-10-CM

## 2022-01-13 DIAGNOSIS — L03119 Cellulitis of unspecified part of limb: Secondary | ICD-10-CM

## 2022-01-13 DIAGNOSIS — E1159 Type 2 diabetes mellitus with other circulatory complications: Secondary | ICD-10-CM | POA: Diagnosis not present

## 2022-01-13 DIAGNOSIS — E039 Hypothyroidism, unspecified: Secondary | ICD-10-CM | POA: Diagnosis not present

## 2022-01-13 LAB — COMPREHENSIVE METABOLIC PANEL
ALT: 17 U/L (ref 0–44)
AST: 21 U/L (ref 15–41)
Albumin: 3.3 g/dL — ABNORMAL LOW (ref 3.5–5.0)
Alkaline Phosphatase: 117 U/L (ref 38–126)
Anion gap: 6 (ref 5–15)
BUN: 9 mg/dL (ref 8–23)
CO2: 31 mmol/L (ref 22–32)
Calcium: 8.8 mg/dL — ABNORMAL LOW (ref 8.9–10.3)
Chloride: 107 mmol/L (ref 98–111)
Creatinine, Ser: 0.62 mg/dL (ref 0.44–1.00)
GFR, Estimated: >60 mL/min (ref >60)
Glucose, Bld: 108 mg/dL — ABNORMAL HIGH (ref 70–99)
Potassium: 3.8 mmol/L (ref 3.5–5.1)
Sodium: 144 mmol/L (ref 135–145)
Total Bilirubin: 0.5 mg/dL (ref 0.3–1.2)
Total Protein: 5.7 g/dL — ABNORMAL LOW (ref 6.5–8.1)

## 2022-01-13 LAB — CBC WITH DIFFERENTIAL/PLATELET
Abs Immature Granulocytes: 0.01 10*3/uL (ref 0.00–0.07)
Basophils Absolute: 0 10*3/uL (ref 0.0–0.1)
Basophils Relative: 1 %
Eosinophils Absolute: 0.1 10*3/uL (ref 0.0–0.5)
Eosinophils Relative: 2 %
HCT: 33.9 % — ABNORMAL LOW (ref 36.0–46.0)
Hemoglobin: 10.7 g/dL — ABNORMAL LOW (ref 12.0–15.0)
Immature Granulocytes: 0 %
Lymphocytes Relative: 37 %
Lymphs Abs: 1.7 10*3/uL (ref 0.7–4.0)
MCH: 26.3 pg (ref 26.0–34.0)
MCHC: 31.6 g/dL (ref 30.0–36.0)
MCV: 83.3 fL (ref 80.0–100.0)
Monocytes Absolute: 0.5 10*3/uL (ref 0.1–1.0)
Monocytes Relative: 11 %
Neutro Abs: 2.3 10*3/uL (ref 1.7–7.7)
Neutrophils Relative %: 49 %
Platelets: 282 10*3/uL (ref 150–400)
RBC: 4.07 MIL/uL (ref 3.87–5.11)
RDW: 16.4 % — ABNORMAL HIGH (ref 11.5–15.5)
WBC: 4.7 10*3/uL (ref 4.0–10.5)
nRBC: 0 % (ref 0.0–0.2)

## 2022-01-13 LAB — PHOSPHORUS: Phosphorus: 4 mg/dL (ref 2.5–4.6)

## 2022-01-13 LAB — GLUCOSE, CAPILLARY
Glucose-Capillary: 95 mg/dL (ref 70–99)
Glucose-Capillary: 106 mg/dL — ABNORMAL HIGH (ref 70–99)
Glucose-Capillary: 192 mg/dL — ABNORMAL HIGH (ref 70–99)
Glucose-Capillary: 207 mg/dL — ABNORMAL HIGH (ref 70–99)

## 2022-01-13 LAB — MAGNESIUM: Magnesium: 1.8 mg/dL (ref 1.7–2.4)

## 2022-01-13 MED ORDER — OXYCODONE HCL 5 MG PO TABS
5.0000 mg | ORAL_TABLET | Freq: Four times a day (QID) | ORAL | Status: DC | PRN
  Administered 2022-01-13 – 2022-01-17 (×11): 5 mg via ORAL
  Filled 2022-01-13 (×11): qty 1

## 2022-01-13 MED ORDER — MAGNESIUM SULFATE 2 GM/50ML IV SOLN
2.0000 g | Freq: Once | INTRAVENOUS | Status: AC
  Administered 2022-01-13: 2 g via INTRAVENOUS
  Filled 2022-01-13: qty 50

## 2022-01-13 MED ORDER — SENNOSIDES-DOCUSATE SODIUM 8.6-50 MG PO TABS
3.0000 | ORAL_TABLET | Freq: Every day | ORAL | Status: DC
  Administered 2022-01-13 – 2022-01-18 (×5): 3 via ORAL
  Filled 2022-01-13 (×6): qty 3

## 2022-01-13 NOTE — TOC Progression Note (Signed)
Transition of Care [facility]) - Progression Note    Patient Details  Name: Marissa Thompson MRN: [id number] Date of Birth: 03-21-1951  [facility] CM/SW Contact  Geni Bers, RN Phone Number: 01/13/2022, 3:54 PM  Clinical Narrative:    PASRR and FL2 started. Pt was faxed out to SNF, however need PASRR and insurance authorization for SNF.    Expected Discharge Plan: Home w Home Health Services Barriers to Discharge: Continued Medical Work up  Expected Discharge Plan and Services Expected Discharge Plan: Home w Home Health Services   Discharge Planning Services: CM Consult   Living arrangements for the past 2 months: Single Family Home                                       Social Determinants of Health (SDOH) Interventions    Readmission Risk Interventions     No data to display

## 2022-01-13 NOTE — Plan of Care (Signed)

## 2022-01-13 NOTE — TOC CM/SW Note (Signed)
30 Day Passar Note  RE:   [id number]      Date of Birth: __1952-04-14   Date:  01/13/2022      To Whom It May Concern:  Please be advised that the above-named patient will require a short-term nursing home stay - anticipated 30 days or less for rehabilitation and strengthening.  The plan is for return home.   Geni Bers, RN, CM [phone]

## 2022-01-13 NOTE — NC FL2 (Signed)
[place] MEDICAID FL2 LEVEL OF CARE SCREENING TOOL     IDENTIFICATION  Patient Name: Marissa Thompson Birthdate: [birth year] Sex: female Admission Date (Current Location): 01/10/2022  [facility] and [place] Number:  Producer, television/film/video and Address:  [facility],  [address] [place], [place] [address]      Provider Number: [id number]  Attending Physician Name and Address:  Merlene Laughter, DO  Relative Name and Phone Number:  Winifred Olive Relative [phone], [place],Barbara Sister [phone]    Current Level of Care: Hospital Recommended Level of Care: Skilled Nursing Facility Prior Approval Number:    Date Approved/Denied:   PASRR Number:    Discharge Plan: SNF    Current Diagnoses: Patient Active Problem List   Diagnosis Date Noted   Osteomyelitis (HCC) 01/10/2022   Restless leg syndrome 08/08/2018   Macular degeneration 07/31/2018   Seborrheic dermatitis of scalp 05/13/2018   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   History of alcoholism (HCC) 04/28/2018   S/P cervical discectomy 04/28/2018   Eczema of both external ears 12/19/2017   Major depression, recurrent, chronic (HCC) 08/29/2017   Carpal tunnel syndrome on right 08/29/2016   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/2016   Dupuytren's contracture of left hand 12/21/2015   Type 2 diabetes mellitus without complications (HCC) 03/09/2015   Memory loss 03/08/2015   Dysphagia, pharyngoesophageal phase 12/29/2014   Vitamin D deficiency 03/03/2014   Allergy    Hypertension associated with diabetes (HCC)    GERD (gastroesophageal reflux disease)    Mixed hyperlipidemia    Hypothyroidism     Orientation RESPIRATION BLADDER Height & Weight     Self, Time, Place, Situation  Normal Continent Weight: 71 kg Height:  4\' 11"  (149.9 cm)  BEHAVIORAL SYMPTOMS/MOOD NEUROLOGICAL BOWEL NUTRITION STATUS      Continent Diet  AMBULATORY STATUS COMMUNICATION OF NEEDS Skin   Limited Assist Verbally Normal                        Personal Care Assistance Level of Assistance  Bathing, Feeding, Dressing Bathing Assistance: Limited assistance Feeding assistance: Independent Dressing Assistance: Independent     Functional Limitations Info  Sight, Hearing, Speech Sight Info: Impaired ) Hearing Info: Adequate Speech Info: Adequate    SPECIAL CARE FACTORS FREQUENCY  PT (By licensed PT), OT (By licensed OT)     PT Frequency: x5 week OT Frequency: x5 week            Contractures Contractures Info: Present (Dupuytren's contracture of left hand)    Additional Factors Info  Code Status, Allergies, Psychotropic Code Status Info: Heart Healthy and Carb Modified Allergies Info: Toradol (Ketorolac Tromethamine), Doxycycline, Baclofen, Morphine And Related, Penicillins, Talwin (Pentazocine) Psychotropic Info: Celexa 20mg  PO Daily         Current Medications (01/13/2022):  This is the current hospital active medication list Current Facility-Administered Medications  Medication Dose Route Frequency Provider Last Rate Last Admin   acetaminophen (TYLENOL) tablet 650 mg  650 mg Oral Q6H PRN , MD   650 mg at 01/11/22 0001   Or   acetaminophen (TYLENOL) suppository 650 mg  650 mg Rectal Q6H PRN Synetta Fail, MD       cefTRIAXone (ROCEPHIN) 1 g in sodium chloride 0.9 % 100 mL IVPB  1 g Intravenous Q24H 01/13/22 B, MD 200 mL/hr at 01/12/22 1823 1 g at 01/12/22 1823   citalopram (CELEXA) tablet 20 mg  20 mg Oral Daily 01/14/22  B, MD   20 mg at 01/13/22 0946   clonazePAM (KLONOPIN) tablet 0.5 mg  0.5 mg Oral QHS PRN Synetta Fail, MD   0.5 mg at 01/12/22 2138   gabapentin (NEURONTIN) capsule 1,200 mg  1,200 mg Oral BID Synetta Fail, MD   1,200 mg at 01/13/22 0946   HYDROmorphone (DILAUDID) injection 0.5 mg  0.5 mg Intravenous Q3H PRN Hazeline Junker B, MD   0.5 mg at 01/13/22 1324   insulin aspart (novoLOG) injection 0-15 Units  0-15 Units Subcutaneous TID [facility]  Synetta Fail, MD   2 Units at 01/12/22 1824   insulin aspart (novoLOG) injection 0-5 Units  0-5 Units Subcutaneous QHS Synetta Fail, MD   2 Units at 01/12/22 2159   insulin glargine-yfgn (SEMGLEE) injection 30 Units  30 Units Subcutaneous QHS Synetta Fail, MD   30 Units at 01/12/22 2200   levothyroxine (SYNTHROID) tablet 100 mcg  100 mcg Oral QHS Synetta Fail, MD   100 mcg at 01/12/22 2138   lisinopril (ZESTRIL) tablet 2.5 mg  2.5 mg Oral Daily Synetta Fail, MD   2.5 mg at 01/13/22 0946   melatonin tablet 3 mg  3 mg Oral QHS PRN Briscoe Deutscher, MD   3 mg at 01/12/22 2138   oxyCODONE (Oxy IR/ROXICODONE) immediate release tablet 5 mg  5 mg Oral Q6H PRN Sheikh, Omair Latif, DO       pantoprazole (PROTONIX) EC tablet 40 mg  40 mg Oral Daily Synetta Fail, MD   40 mg at 01/13/22 0946   pravastatin (PRAVACHOL) tablet 40 mg  40 mg Oral Daily Synetta Fail, MD   40 mg at 01/13/22 0946   senna-docusate (Senokot-S) tablet 3 tablet  3 tablet Oral Q0600 Marguerita Merles [place], DO   3 tablet at 01/13/22 1217   sodium chloride flush (NS) 0.9 % injection 3 mL  3 mL Intravenous Q12H Synetta Fail, MD   3 mL at 01/13/22 0950   vancomycin (VANCOREADY) IVPB 750 mg/150 mL  750 mg Intravenous Q24H Norva Pavlov, RPH 150 mL/hr at 01/12/22 2100 750 mg at 01/12/22 2100     Discharge Medications: Please see discharge summary for a list of discharge medications.  Relevant Imaging Results:  Relevant Lab Results:   Additional Information SS#[id number]  Geni Bers, RN

## 2022-01-13 NOTE — Progress Notes (Signed)
Subjective: States that she is having some pain today.  No fevers or chills.   Objective: AAO x3, NAD-seems in better spirits today. DP/PT pulses palpable bilaterally, CRT less than 3 seconds Incision from surgery coapted.  There is some slight erythema dorsal aspect of foot and some peeling skin is present.  Clinically appears to be about the same compared to yesterday.  There is no areas of fluctuation crepitation.  No malodor.  No drainage. No pain with calf compression, swelling, warmth, erythema  Assessment: Status post left foot surgery with improving cellulitis  Plan: She is currently on IV antibiotics, ceftriaxone and vancomycin.  We can continue that for now but discussed switching her to oral antibiotics for soft tissue coverage for another week.  I encouraged her to keep the foot iced and elevated and stay off as much as possible given her recent surgery.  When she is ambulating should wear surgical shoe.  She is not able to tolerate a cam boot.  Podiatry will continue to follow for now.  Ovid Curd, DPM

## 2022-01-13 NOTE — Progress Notes (Signed)
PROGRESS NOTE    Marissa Thompson  [id number] DOB: 06-19-1951 DOA: 01/10/2022 PCP: Leamon Arnt, MD   Brief Narrative:  Marissa Thompson is a 71 y.o. female with medical history significant of hypertension, diabetes, GERD, hyperlipidemia, hypothyroidism, depression, RLS, macular degeneration, bunionectomy 6/8 presenting with a week of foot pain redness and swelling. She was admitted for concern of postoperative infection vs. osteomyelitis with bone scan and podiatry consult.  Podiatry evaluated and reviewed the bone scans and based on her blood work doubted that she has osteomyelitis at this time and it is secondary to most likely postoperative changes.  Patient had a fall last week and podiatry recommend starting IV antibiotics for soft tissue infection for now and then discharging on oral antibiotics.  After further review will likely change her to cefadroxil plus Bactrim.  PT evaluating and recommending SNF given her multiple falls.  We feel she would benefit from skilled nursing facility for further rehabilitative efforts.   Assessment and Plan: No notes have been filed under this hospital service. Service: Hospitalist  Postoperative soft tissue infection and Cellulitis, possible osteomyelitis ruled out  -Systemic inflammatory markers thus far are reassuring, bone scan done and showed "Increased blood flow, blood pool, and delayed uptake of tracer at operative sites in the LEFT foot consistent with postoperative changes. Due to the recent time of surgery, unable to exclude or confirm osteomyelitis at the operative sites."  -Per discussion with podiatry, If it is negative then would like to give her IV antibiotics while hospitalized then send home with oral.  -If positive or need to do biopsy  but bone Scan was negative -Blood cultures x2 show NO Growth at 2 Days  -CRP 0.6 -Lactic acid level went from 2.6 is now 2.4 -Continue pain control as ordered with Acetaminophen 650 mg po/RC and  Hydromorphone 0.5 mg IV q3hprn Severe and Moderate Pain. Will add Oxycodone IR 5 mg po q6hprn for Moderate Pain for po pain Control  -Per podiatry she is weightbearing in a surgical shoe but they prefer to be in a boot -PT OT ordered and recommending SNF   Hypertension -Continue home Lisinopril 2.5 mg p.o. daily -Continue to Monitor BP per Protocol -Last BP reading is 124/59   Hyperlipidemia -Continue home Pravastatin 40 mg p.o. daily   Depression Anxiety - Continue home Citalopram, 20 mg po daily and as needed clonazepam 0.5 milligram p.o. every 8 hours as as needed   RLS - Continue home Gabapentin 1200 mg p.o. twice daily   Hypothyroidism - Continue home Levothyroxine 100 mcg p.o. nightly   Hypoalbuminemia -Patient's albumin level went from 3.1 -> 3.3 -Continue monitor and trend and repeat CMP in the a.m.   Normocytic Anemia -Patient's hemoglobin/hematocrit went from 11.8/38.9 -> 10.7/34.4 -> 10.7/33.9 -Check anemia panel in a.m. -Continue to monitor for signs and symptoms bleeding; no overt bleeding noted -Repeat CBC in the AM   IDT2DM:  -Continue with 30 units long-acting insulin and SSI -CBG's ranging from 95-206 -Continue to monitor blood sugars carefully and adjust insulin regimen as necessary   GERD/GI Prophylaxis  - Continue home PPI with Pantoprazole 40 mg p.o. daily   Obesity -Complicates overall prognosis and care -Estimated body mass index is 31.61 kg/m as calculated from the following:   Height as of this encounter: 4\' 11"  (1.499 m).   Weight as of this encounter: 71 kg.  -Weight Loss and Dietary Counseling given  DVT prophylaxis: SCDs Start: 01/10/22 2137    Code Status:  Full Code Family Communication: No family present at bedside   Disposition Plan:  Level of care: Med-Surg Status is: Inpatient Remains inpatient appropriate because: Pending SNF placement   Consultants:  Podiatry   Procedures:  None   Antimicrobials:  Anti-infectives  (From admission, onward)    Start     Dose/Rate Route Frequency Ordered Stop   01/12/22 2000  vancomycin (VANCOREADY) IVPB 750 mg/150 mL        750 mg 150 mL/hr over 60 Minutes Intravenous Every 24 hours 01/11/22 1844     01/11/22 1930  cefTRIAXone (ROCEPHIN) 1 g in sodium chloride 0.9 % 100 mL IVPB       Note to Pharmacy: has tolerated keflex   1 g 200 mL/hr over 30 Minutes Intravenous Every 24 hours 01/11/22 1824     01/11/22 1930  vancomycin (VANCOCIN) IVPB 1000 mg/200 mL premix        1,000 mg 200 mL/hr over 60 Minutes Intravenous  Once 01/11/22 1844 01/11/22 2024       Subjective: Seen and examined at bedside and states that she was having a lot of pain today in her foot.  She states it is throbbing more so than yesterday.  She denies any lightheadedness or dizziness.  K.  Work with therapy and they are recommending SNF and she states that she would like to speak with Child psychotherapist.  No other concerns or complaints at this time.  Objective: Vitals:   01/12/22 1317 01/12/22 2011 01/13/22 0533 01/13/22 0544  BP: 134/63 (!) 148/56 (!) 124/59   Pulse: 81 85 72   Resp: 20 18 16    Temp:  97.6 F (36.4 C) 98.3 F (36.8 C)   TempSrc:  Oral Oral   SpO2: 97% 96% 94%   Weight:    71 kg  Height:        Intake/Output Summary (Last 24 hours) at 01/13/2022 1237 Last data filed at 01/13/2022 1005 Gross per 24 hour  Intake 730 ml  Output 1800 ml  Net -1070 ml   Filed Weights   01/11/22 0500 01/12/22 0458 01/13/22 0544  Weight: 72.1 kg 72.4 kg 71 kg   Examination: Physical Exam:  Constitutional: WN/WD obese Caucasian female in no acute distress appears mildly uncomfortable and in slight pain Respiratory: Diminished to auscultation bilaterally with coarse breath sounds, no wheezing, rales, rhonchi or crackles. Normal respiratory effort and patient is not tachypenic. No accessory muscle use.  Unlabored breathing Cardiovascular: RRR, no murmurs / rubs / gallops. S1 and S2 auscultated.   Has some left foot swelling which is improving and erythema is improving as well Abdomen: Soft, non-tender, distended secondary body habitus. Bowel sounds positive.  GU: Deferred. Musculoskeletal: Has postoperative changes from her bunionectomy on her left foot.  Neurologic: CN 2-12 grossly intact with no focal deficits.  Romberg sign and cerebellar reflexes not assessed.  Psychiatric: Normal judgment and insight. Alert and oriented x 3. Normal mood and appropriate affect.   Data Reviewed: I have personally reviewed following labs and imaging studies  CBC: Recent Labs  Lab 01/10/22 1633 01/11/22 0409 01/13/22 0407  WBC 4.1 5.3 4.7  NEUTROABS 2.4  --  2.3  HGB 11.8* 10.7* 10.7*  HCT 38.9 34.4* 33.9*  MCV 84.7 83.9 83.3  PLT 335 294 282   Basic Metabolic Panel: Recent Labs  Lab 01/10/22 1633 01/11/22 0409 01/13/22 0407  NA 137 143 144  K 4.1 3.8 3.8  CL 104 109 107  CO2 24 28 31  GLUCOSE 231* 129* 108*  BUN 10 8 9   CREATININE 0.63 0.61 0.62  CALCIUM 9.0 8.9 8.8*  MG  --   --  1.8  PHOS  --   --  4.0   GFR: Estimated Creatinine Clearance: 56.9 mL/min (by C-G formula based on SCr of 0.62 mg/dL). Liver Function Tests: Recent Labs  Lab 01/11/22 0409 01/13/22 0407  AST 16 21  ALT 15 17  ALKPHOS 102 117  BILITOT 0.2* 0.5  PROT 5.5* 5.7*  ALBUMIN 3.1* 3.3*   No results for input(s): "LIPASE", "AMYLASE" in the last 168 hours. No results for input(s): "AMMONIA" in the last 168 hours. Coagulation Profile: No results for input(s): "INR", "PROTIME" in the last 168 hours. Cardiac Enzymes: No results for input(s): "CKTOTAL", "CKMB", "CKMBINDEX", "TROPONINI" in the last 168 hours. BNP (last 3 results) No results for input(s): "PROBNP" in the last 8760 hours. HbA1C: No results for input(s): "HGBA1C" in the last 72 hours. CBG: Recent Labs  Lab 01/12/22 1133 01/12/22 1648 01/12/22 2158 01/13/22 0756 01/13/22 1150  GLUCAP 156* 143* 206* 95 106*   Lipid Profile: No  results for input(s): "CHOL", "HDL", "LDLCALC", "TRIG", "CHOLHDL", "LDLDIRECT" in the last 72 hours. Thyroid Function Tests: No results for input(s): "TSH", "T4TOTAL", "FREET4", "T3FREE", "THYROIDAB" in the last 72 hours. Anemia Panel: No results for input(s): "VITAMINB12", "FOLATE", "FERRITIN", "TIBC", "IRON", "RETICCTPCT" in the last 72 hours. Sepsis Labs: Recent Labs  Lab 01/10/22 1633 01/10/22 1948  LATICACIDVEN 2.6* 2.4*    Recent Results (from the past 240 hour(s))  Blood culture (routine x 2)     Status: None (Preliminary result)   Collection Time: 01/10/22 10:16 PM   Specimen: BLOOD  Result Value Ref Range Status   Specimen Description   Final    BLOOD LEFT ANTECUBITAL Performed at [facility], [address], [place], [place]    Special Requests   Final    BOTTLES DRAWN AEROBIC AND ANAEROBIC Blood Culture results may not be optimal due to an inadequate volume of blood received in culture bottles Performed at [facility], [address] [place], [place]    Culture   Final    NO GROWTH 2 DAYS Performed at [facility], [address], [place], [place]    Report Status PENDING  Incomplete  Blood culture (routine x 2)     Status: None (Preliminary result)   Collection Time: 01/11/22  4:09 AM   Specimen: BLOOD  Result Value Ref Range Status   Specimen Description   Final    BLOOD LEFT ANTECUBITAL Performed at [facility], [address], [place], [place]    Special Requests   Final    BOTTLES DRAWN AEROBIC ONLY Blood Culture adequate volume Performed at [facility], [address] [place], [place]    Culture   Final    NO GROWTH 2 DAYS Performed at [facility], [address] [place], [place]    Report Status PENDING  Incomplete    Radiology Studies: NM Bone Scan 3 Phase Lower Extremity  Result Date:  01/11/2022 CLINICAL DATA:  LEFT foot arthrodesis and great toe surgery, pain, treated for infection, question osteomyelitis EXAM: NUCLEAR MEDICINE 3-PHASE BONE SCAN TECHNIQUE: Radionuclide angiographic images, immediate static blood pool images, and 3-hour delayed static images were obtained of the feet/ankles after intravenous injection of radiopharmaceutical. RADIOPHARMACEUTICALS:  21.9 mCi Tc-80m MDP IV COMPARISON:  LEFT foot radiographs FINDINGS: Vascular phase: Increased blood flow to mid left foot and great toe Blood pool phase: Increased blood pool at mid LEFT foot and great toe Delayed phase: Minimal uptake of tracer at mid RIGHT foot likely degenerative. In LEFT foot, markedly increased uptake in mid LEFT foot at sites of surgery of of the first through third metatarsals. Additional uptake at the LEFT great toe at the distal first metatarsal and proximal phalanx at sites of prior bunionectomy and osteotomy. Observed uptake is consistent with postoperative change at both the metatarsals and the great toe. Unable to confirm/exclude osteomyelitis by scintigraphy at this interval post surgery. IMPRESSION: Increased blood flow, blood pool, and delayed uptake of tracer at operative sites in the LEFT foot consistent with postoperative changes. Due to the recent time of surgery, unable to exclude or confirm osteomyelitis at the operative sites. Electronically Signed   By: Lavonia Dana M.D.   On: 01/11/2022 13:43    Scheduled Meds:  citalopram  20 mg Oral Daily   gabapentin  1,200 mg Oral BID   insulin aspart  0-15 Units Subcutaneous TID WC   insulin aspart  0-5 Units Subcutaneous QHS   insulin glargine-yfgn  30 Units Subcutaneous QHS   levothyroxine  100 mcg Oral QHS   lisinopril  2.5 mg Oral Daily   pantoprazole  40 mg Oral Daily   pravastatin  40 mg Oral Daily   senna-docusate  3 tablet Oral Q0600   sodium chloride flush  3 mL Intravenous Q12H   Continuous Infusions:  cefTRIAXone (ROCEPHIN)  IV 1 g  (01/12/22 1823)   vancomycin 750 mg (01/12/22 2100)    LOS: 2 days   Raiford Noble, DO [facility] Available via Epic secure chat 7am-7pm After these hours, please refer to coverage provider listed on [email or web address] 01/13/2022, 12:37 PM

## 2022-01-14 ENCOUNTER — Encounter (HOSPITAL_COMMUNITY): Payer: Self-pay | Admitting: Family Medicine

## 2022-01-14 ENCOUNTER — Other Ambulatory Visit: Payer: Self-pay

## 2022-01-14 ENCOUNTER — Inpatient Hospital Stay (HOSPITAL_COMMUNITY): Payer: Medicare HMO

## 2022-01-14 DIAGNOSIS — R52 Pain, unspecified: Secondary | ICD-10-CM | POA: Diagnosis not present

## 2022-01-14 DIAGNOSIS — E1159 Type 2 diabetes mellitus with other circulatory complications: Secondary | ICD-10-CM | POA: Diagnosis not present

## 2022-01-14 DIAGNOSIS — F339 Major depressive disorder, recurrent, unspecified: Secondary | ICD-10-CM | POA: Diagnosis not present

## 2022-01-14 DIAGNOSIS — K2100 Gastro-esophageal reflux disease with esophagitis, without bleeding: Secondary | ICD-10-CM | POA: Diagnosis not present

## 2022-01-14 DIAGNOSIS — E039 Hypothyroidism, unspecified: Secondary | ICD-10-CM | POA: Diagnosis not present

## 2022-01-14 LAB — GLUCOSE, CAPILLARY
Glucose-Capillary: 83 mg/dL (ref 70–99)
Glucose-Capillary: 155 mg/dL — ABNORMAL HIGH (ref 70–99)
Glucose-Capillary: 122 mg/dL — ABNORMAL HIGH (ref 70–99)
Glucose-Capillary: 236 mg/dL — ABNORMAL HIGH (ref 70–99)

## 2022-01-14 LAB — COMPREHENSIVE METABOLIC PANEL
ALT: 17 U/L (ref 0–44)
AST: 22 U/L (ref 15–41)
Albumin: 3.2 g/dL — ABNORMAL LOW (ref 3.5–5.0)
Alkaline Phosphatase: 119 U/L (ref 38–126)
Anion gap: 6 (ref 5–15)
BUN: 9 mg/dL (ref 8–23)
CO2: 28 mmol/L (ref 22–32)
Calcium: 8.6 mg/dL — ABNORMAL LOW (ref 8.9–10.3)
Chloride: 107 mmol/L (ref 98–111)
Creatinine, Ser: 0.59 mg/dL (ref 0.44–1.00)
GFR, Estimated: >60 mL/min (ref >60)
Glucose, Bld: 94 mg/dL (ref 70–99)
Potassium: 3.7 mmol/L (ref 3.5–5.1)
Sodium: 141 mmol/L (ref 135–145)
Total Bilirubin: 0.5 mg/dL (ref 0.3–1.2)
Total Protein: 5.6 g/dL — ABNORMAL LOW (ref 6.5–8.1)

## 2022-01-14 LAB — PHOSPHORUS: Phosphorus: 3.8 mg/dL (ref 2.5–4.6)

## 2022-01-14 LAB — CBC WITH DIFFERENTIAL/PLATELET
Abs Immature Granulocytes: 0.01 10*3/uL (ref 0.00–0.07)
Basophils Absolute: 0 10*3/uL (ref 0.0–0.1)
Basophils Relative: 1 %
Eosinophils Absolute: 0.1 10*3/uL (ref 0.0–0.5)
Eosinophils Relative: 2 %
HCT: 34.6 % — ABNORMAL LOW (ref 36.0–46.0)
Hemoglobin: 10.4 g/dL — ABNORMAL LOW (ref 12.0–15.0)
Immature Granulocytes: 0 %
Lymphocytes Relative: 35 %
Lymphs Abs: 1.8 10*3/uL (ref 0.7–4.0)
MCH: 25.4 pg — ABNORMAL LOW (ref 26.0–34.0)
MCHC: 30.1 g/dL (ref 30.0–36.0)
MCV: 84.4 fL (ref 80.0–100.0)
Monocytes Absolute: 0.7 10*3/uL (ref 0.1–1.0)
Monocytes Relative: 13 %
Neutro Abs: 2.5 10*3/uL (ref 1.7–7.7)
Neutrophils Relative %: 49 %
Platelets: 273 10*3/uL (ref 150–400)
RBC: 4.1 MIL/uL (ref 3.87–5.11)
RDW: 16.7 % — ABNORMAL HIGH (ref 11.5–15.5)
WBC: 5.1 10*3/uL (ref 4.0–10.5)
nRBC: 0 % (ref 0.0–0.2)

## 2022-01-14 LAB — FOLATE: Folate: 26.2 ng/mL (ref >5.9)

## 2022-01-14 LAB — FERRITIN: Ferritin: 11 ng/mL (ref 11–307)

## 2022-01-14 LAB — RETICULOCYTES
Immature Retic Fract: 27.2 % — ABNORMAL HIGH (ref 2.3–15.9)
RBC.: 4.06 MIL/uL (ref 3.87–5.11)
Retic Count, Absolute: 80 10*3/uL (ref 19.0–186.0)
Retic Ct Pct: 2 % (ref 0.4–3.1)

## 2022-01-14 LAB — IRON AND TIBC
Iron: 24 ug/dL — ABNORMAL LOW (ref 28–170)
Saturation Ratios: 7 % — ABNORMAL LOW (ref 10.4–31.8)
TIBC: 339 ug/dL (ref 250–450)
UIBC: 315 ug/dL

## 2022-01-14 LAB — MAGNESIUM: Magnesium: 2.3 mg/dL (ref 1.7–2.4)

## 2022-01-14 LAB — VITAMIN B12: Vitamin B-12: 78 pg/mL — ABNORMAL LOW (ref 180–914)

## 2022-01-14 MED ORDER — VITAMIN B-12 1000 MCG PO TABS
1000.0000 ug | ORAL_TABLET | Freq: Every day | ORAL | Status: DC
  Administered 2022-01-15 – 2022-01-18 (×4): 1000 ug via ORAL
  Filled 2022-01-14 (×4): qty 1

## 2022-01-14 MED ORDER — ASPIRIN 81 MG PO TBEC
81.0000 mg | DELAYED_RELEASE_TABLET | Freq: Every day | ORAL | Status: DC
  Administered 2022-01-14 – 2022-01-18 (×5): 81 mg via ORAL
  Filled 2022-01-14 (×5): qty 1

## 2022-01-14 MED ORDER — ENOXAPARIN SODIUM 40 MG/0.4ML IJ SOSY
40.0000 mg | PREFILLED_SYRINGE | INTRAMUSCULAR | Status: DC
  Administered 2022-01-14 – 2022-01-17 (×4): 40 mg via SUBCUTANEOUS
  Filled 2022-01-14 (×4): qty 0.4

## 2022-01-14 MED ORDER — OCUVITE-LUTEIN PO CAPS
1.0000 | ORAL_CAPSULE | Freq: Every day | ORAL | Status: DC
  Filled 2022-01-14: qty 1

## 2022-01-14 MED ORDER — PROSIGHT PO TABS
1.0000 | ORAL_TABLET | Freq: Every day | ORAL | Status: DC
Start: 2022-01-14 — End: 2022-01-18
  Administered 2022-01-14 – 2022-01-18 (×5): 1 via ORAL
  Filled 2022-01-14 (×5): qty 1

## 2022-01-14 MED ORDER — CYANOCOBALAMIN 1000 MCG/ML IJ SOLN
1000.0000 ug | Freq: Once | INTRAMUSCULAR | Status: AC
  Administered 2022-01-14: 1000 ug via INTRAMUSCULAR
  Filled 2022-01-14: qty 1

## 2022-01-14 MED ORDER — POLYSACCHARIDE IRON COMPLEX 150 MG PO CAPS
150.0000 mg | ORAL_CAPSULE | Freq: Every day | ORAL | Status: DC
  Administered 2022-01-14 – 2022-01-18 (×5): 150 mg via ORAL
  Filled 2022-01-14 (×5): qty 1

## 2022-01-14 NOTE — Plan of Care (Signed)

## 2022-01-14 NOTE — Progress Notes (Signed)
Pharmacy Antibiotic Note  Marissa Thompson is a 71 y.o. female admitted on 01/10/2022 with foot pain, redness, swelling, cellulitis.  Admitted for concern of postoperative infection vs. osteomyelitis with bone scan and podiatry consulted.  Pharmacy has been consulted for Vancomycin dosing.    Plan: Continue ceftriaxone per MD Continue Vancomycin 750 mg IV Q 24 hrs. Goal AUC 400-550. Expected AUC: 473, SCr used: 0.8 Follow up renal function, culture results, and clinical course.    Height: 4\' 11"  (149.9 cm) Weight: 72.4 kg (159 lb 9.8 oz) IBW/kg (Calculated) : 43.2  Temp (24hrs), Avg:98.3 F (36.8 C), Min:98.2 F (36.8 C), Max:98.4 F (36.9 C)  Recent Labs  Lab 01/10/22 1633 01/10/22 1948 01/11/22 0409 01/13/22 0407 01/14/22 0421  WBC 4.1  --  5.3 4.7 5.1  CREATININE 0.63  --  0.61 0.62 0.59  LATICACIDVEN 2.6* 2.4*  --   --   --      Estimated Creatinine Clearance: 57.5 mL/min (by C-G formula based on SCr of 0.59 mg/dL).    Allergies  Allergen Reactions   Toradol [Ketorolac Tromethamine] Other (See Comments)    Slurred speech, confusion   Doxycycline Nausea And Vomiting   Baclofen Other (See Comments)    Memory Loss and shakes   Morphine And Related Itching   Penicillins Rash    Has patient had a PCN reaction causing immediate rash, facial/tongue/throat swelling, SOB or lightheadedness with hypotension: Yes Has patient had a PCN reaction causing severe rash involving mucus membranes or skin necrosis: No Has patient had a PCN reaction that required hospitalization: No Has patient had a PCN reaction occurring within the last 10 years: No If all of the above answers are "NO", then may proceed with Cephalosporin use.    Talwin [Pentazocine] Itching   Antimicrobials this admission:  Vanco 6/29>> Rocephin 6/29>>    Dose adjustments this admission:    Microbiology results:   6/28 BCx: ngtd    7/28 PharmD, BCPS Clinical Pharmacist [facility] main pharmacy  [phone] 01/14/2022 1:31 PM

## 2022-01-14 NOTE — Progress Notes (Signed)
Physical Therapy Treatment Patient Details Name: Marissa Thompson MRN: [id number] DOB: 11/29/1950 Today's Date: 01/14/2022   History of Present Illness Patient is 71 y.o. female admitted 01/10/22 wtih cellulitis. Pt admitted with concern for postoperative infection vs. osteo, podietry consulted and feel most likely post operative change as pt is s/p Lt foot bunionectomy on 12/21/21. PMH significant for HTN, DM, HLD, GERD, hypothyroidism, depression, RLS, macular degeneration.    PT Comments    Pt continues to participate well. Increased pain in L foot with ambulation. Pt does well keeping foot elevated when not mobilizing. She reports she cannot tolerate the weight of ice packs on her foot and she doesn't feel like the ones we have get cold. Discussed d/c plan on today-pt stated her plan is still for ST rehab at [facility].     Recommendations for follow up therapy are one component of a multi-disciplinary discharge planning process, led by the attending physician.  Recommendations may be updated based on patient status, additional functional criteria and insurance authorization.  Follow Up Recommendations  Skilled nursing-short term rehab (<3 hours/day) Can patient physically be transported by private vehicle: Yes   Assistance Recommended at Discharge Intermittent Supervision/Assistance  Patient can return home with the following A little help with walking and/or transfers;A little help with bathing/dressing/bathroom;Assist for transportation   Equipment Recommendations  None recommended by PT    Recommendations for Other Services       Precautions / Restrictions Precautions Precautions: Fall Required Braces or Orthoses:  (post op shoe L foot) Restrictions Weight Bearing Restrictions: Yes LLE Weight Bearing: Weight bearing as tolerated Other Position/Activity Restrictions: Per podiatrist pt is WBAT with surgical shoe.     Mobility  Bed Mobility Overal bed mobility: Needs Assistance Bed  Mobility: Supine to Sit, Sit to Supine     Supine to sit: Supervision Sit to supine: Supervision   General bed mobility comments: Supv for safety.    Transfers Overall transfer level: Needs assistance Equipment used: Rolling walker (2 wheels) Transfers: Sit to/from Stand Sit to Stand: Supervision           General transfer comment: Supv for safety.    Ambulation/Gait Ambulation/Gait assistance: Supervision Gait Distance (Feet): 125 Feet Assistive device: Rolling walker (2 wheels) Gait Pattern/deviations: Step-through pattern, Decreased stance time - left       General Gait Details: Gait is antalgic especially as distance increases. Supv for safety.   Stairs             Wheelchair Mobility    Modified Rankin (Stroke Patients Only)       Balance Overall balance assessment: Mild deficits observed, not formally tested         Standing balance support: Bilateral upper extremity supported, During functional activity, Reliant on assistive device for balance                                Cognition Arousal/Alertness: Awake/alert Behavior During Therapy: WFL for tasks assessed/performed Overall Cognitive Status: Within Functional Limits for tasks assessed                                          Exercises      General Comments        Pertinent Vitals/Pain Pain Assessment Pain Assessment: 0-10 Pain Score: 7  Pain Location: 5/10 at  rest; 7/10 with activity Pain Descriptors / Indicators: Aching, Discomfort, Tender, Sore Pain Intervention(s): Monitored during session    Home Living                          Prior Function            PT Goals (current goals can now be found in the care plan section) Progress towards PT goals: Progressing toward goals    Frequency    Min 3X/week      PT Plan Current plan remains appropriate    Co-evaluation              AM-PAC PT "6 Clicks" Mobility    Outcome Measure  Help needed turning from your back to your side while in a flat bed without using bedrails?: None Help needed moving from lying on your back to sitting on the side of a flat bed without using bedrails?: None Help needed moving to and from a bed to a chair (including a wheelchair)?: None Help needed standing up from a chair using your arms (e.g., wheelchair or bedside chair)?: None Help needed to walk in hospital room?: A Little Help needed climbing 3-5 steps with a railing? : A Little 6 Click Score: 22    End of Session   Activity Tolerance: Patient limited by pain Patient left: in bed;with call bell/phone within reach (with L LE elevated) Equipment used: post op shoe  PT Visit Diagnosis: Unsteadiness on feet (R26.81);Other abnormalities of gait and mobility (R26.89);Pain Pain - Right/Left: Left Pain - part of body: Ankle and joints of foot     Time: 1520-1536 PT Time Calculation (min) (ACUTE ONLY): 16 min  Charges:  $Gait Training: 8-22 mins                         Faye Ramsay, PT Acute Rehabilitation  Office: [phone] Pager: [phone]

## 2022-01-14 NOTE — Progress Notes (Signed)
PROGRESS NOTE    Marissa BERTRAM  Thompson:[id number] DOB: Jun 27, 1951 DOA: 01/10/2022 PCP: Willow Ora, MD   Brief Narrative:  Marissa Thompson is a 71 y.o. female with medical history significant of hypertension, diabetes, GERD, hyperlipidemia, hypothyroidism, depression, RLS, macular degeneration, bunionectomy 6/8 presenting with a week of foot pain redness and swelling. She was admitted for concern of postoperative infection vs. osteomyelitis with bone scan and podiatry consult.  Podiatry evaluated and reviewed the bone scans and based on her blood work doubted that she has osteomyelitis at this time and it is secondary to most likely postoperative changes.  Patient had a fall last week and podiatry recommend starting IV antibiotics for soft tissue infection for now and then discharging on oral antibiotics.  After further review will likely change her to cefadroxil plus Bactrim.  PT evaluating and recommending SNF given her multiple falls.  We feel she would benefit from skilled nursing facility for further rehabilitative efforts and she is working with physical therapy and podiatry evaluated and feels that her inflammatory markers are reassuring and recommending that she is limiting her activity icing and elevating her foot and leg.  They are recommending continue IV antibiotics for now and discharging with oral antibiotics given that her cellulitis is improving.  Patient is stable for discharge to SNF and will change to p.o. cefadroxil and Bactrim at discharge however insurance authorization and PASRR is still pending.    Assessment and Plan: No notes have been filed under this hospital service. Service: Hospitalist  Postoperative soft tissue infection and Cellulitis, possible osteomyelitis ruled out  -Systemic inflammatory markers thus far are reassuring, bone scan done and showed "Increased blood flow, blood pool, and delayed uptake of tracer at operative sites in the LEFT foot consistent with  postoperative changes. Due to the recent time of surgery, unable to exclude or confirm osteomyelitis at the operative sites."  -Per discussion with podiatry, If it is negative then would like to give her IV antibiotics while hospitalized then send home with oral.  -If positive or need to do biopsy  but bone Scan was negative -Blood cultures x2 show NO Growth at 3 Days  -CRP 0.6 -Lactic acid level went from 2.6 is now 2.4 -Continue pain control as ordered with Acetaminophen 650 mg po/RC and Hydromorphone 0.5 mg IV q3hprn Severe and Moderate Pain. Will add Oxycodone IR 5 mg po q6hprn for Moderate Pain for po pain Control  -Per podiatry she is weightbearing in a surgical shoe but they prefer to be in a boot -Patient continued to be afebrile had no leukocytosis -Given that she has been immobile and given her recent surgery venous duplex was ordered and showed no evidence of DVT and Dr. Loreta Ave recommended DVT prophylaxis so we have initiated Lovenox injections and resumed her aspirin -Podiatry recommends that she is weightbearing as tolerated in surgical shoe but they have discussed the importance of trying to limit weightbearing and also discussed that when she is up and walking that she needs to wear her surgical shoe.  From a podiatry parents point they are recommending oral antibiotics and consider switching to Bactrim and Cipro however we will change to Bactrim and cefadroxil likely -PT/OT ordered and recommending SNF and pending placement   Hypertension -Continue home Lisinopril 2.5 mg p.o. daily -Continue to Monitor BP per Protocol -Last BP reading is 106/51   Hyperlipidemia -Continue home Pravastatin 40 mg p.o. daily   Depression Anxiety - Continue home Citalopram, 20 mg po daily  and as needed Clonazepam 0.5 milligram p.o. every 8 hours as as needed   RLS - Continue home Gabapentin 1200 mg p.o. twice daily   Hypothyroidism - Continue home Levothyroxine 100 mcg p.o. nightly    Hypoalbuminemia -Patient's albumin level went from 3.1 -> 3.3 and is now 3.2 -Continue monitor and trend and repeat CMP in the a.m.   Normocytic Anemia -Patient's hemoglobin/hematocrit went from 11.8/38.9 -> 10.7/34.4 -> 10.7/33.9 -> 10.4/34.6 -Anemia panel done and showed an iron level of 24, U IBC of 315, TIBC of 339, saturation of 7%, ferritin level 11%, follow-up 26.2 and a vitamin B12 of 78 -We will start repletion and start Niferex 150 mg p.o. daily and she will receive oh vitamin B-12 1000 mcg injection today and then start 1000 mcg p.o. tomorrow -Continue to monitor for signs and symptoms bleeding; no overt bleeding noted -Repeat CBC in the AM   IDT2DM:  -Continue with 30 units long-acting insulin and SSI -CBG's ranging from 83-207 -Continue to monitor blood sugars carefully and adjust insulin regimen as necessary   GERD/GI Prophylaxis  - Continue home PPI with Pantoprazole 40 mg p.o. daily   Obesity -Complicates overall prognosis and care -Estimated body mass index is 32.24 kg/m as calculated from the following:   Height as of this encounter: 4\' 11"  (1.499 m).   Weight as of this encounter: 72.4 kg.  -Weight Loss and Dietary Counseling given  DVT prophylaxis: enoxaparin (LOVENOX) injection 40 mg Start: 01/14/22 1600 SCDs Start: 01/10/22 2137    Code Status: Full Code Family Communication: No family currently at bedside  Disposition Plan:  Level of care: Med-Surg Status is: Inpatient Remains inpatient appropriate because: Needs SNF and this is pending   Consultants:  Podiatry  Procedures:  Lower extremity venous duplex on the left Summary:  RIGHT:  - No evidence of common femoral vein obstruction.     LEFT:  - There is no evidence of deep vein thrombosis in the lower extremity.     - No cystic structure found in the popliteal fossa.      Antimicrobials:  Anti-infectives (From admission, onward)    Start     Dose/Rate Route Frequency Ordered Stop    01/12/22 2000  vancomycin (VANCOREADY) IVPB 750 mg/150 mL        750 mg 150 mL/hr over 60 Minutes Intravenous Every 24 hours 01/11/22 1844     01/11/22 1930  cefTRIAXone (ROCEPHIN) 1 g in sodium chloride 0.9 % 100 mL IVPB       Note to Pharmacy: has tolerated keflex   1 g 200 mL/hr over 30 Minutes Intravenous Every 24 hours 01/11/22 1824     01/11/22 1930  vancomycin (VANCOCIN) IVPB 1000 mg/200 mL premix        1,000 mg 200 mL/hr over 60 Minutes Intravenous  Once 01/11/22 1844 01/11/22 2024       Subjective: Seen and examined at bedside and she was frustrated.  States that she wants to get up and ambulate but there is no nursing staff.  Also complains of not getting her pain meds.  Frustrated and wanting to go to SNF however social worker has faxed her out and pending bed availability.  States that her pain is still there.  Just feels frustrated that she came in for a bunionectomy and has been hospitalized for this long.  Denies any other concerns or complaints at this time.  Objective: Vitals:   01/13/22 2356 01/14/22 0500 01/14/22 0510 01/14/22 1242  BP: (!) 118/53  (!) 122/55 (!) 106/51  Pulse: 80  78 81  Resp: 18  18 20   Temp: 98.4 F (36.9 C)  98.4 F (36.9 C) 98.2 F (36.8 C)  TempSrc: Oral  Oral Oral  SpO2: 94%  92%   Weight:  72.4 kg    Height:        Intake/Output Summary (Last 24 hours) at 01/14/2022 1555 Last data filed at 01/14/2022 1300 Gross per 24 hour  Intake 820 ml  Output --  Net 820 ml   Filed Weights   01/12/22 0458 01/13/22 0544 01/14/22 0500  Weight: 72.4 kg 71 kg 72.4 kg    Examination: Physical Exam:  Constitutional: WN/WD obese Caucasian female in no acute distress but is frustrated Respiratory: Diminished to auscultation bilaterally with coarse breath sounds, no wheezing, rales, rhonchi or crackles. Normal respiratory effort and patient is not tachypenic. No accessory muscle use.  Unlabored breathing Cardiovascular: RRR, no murmurs / rubs /  gallops. S1 and S2 auscultated.  Has some slight lower extremity edema on the left Abdomen: Soft, non-tender, distended secondary body habitus. Bowel sounds positive.  GU: Deferred. Musculoskeletal: Bunionectomy scars noted Skin: Left foot is slightly warm and swollen but not as erythematous as erythema is improving Neurologic: CN 2-12 grossly intact with no focal deficits. Romberg sign and cerebellar reflexes not assessed.  Psychiatric: Normal judgment and insight. Alert and oriented x 3. Normal mood and appropriate affect.   Data Reviewed: I have personally reviewed following labs and imaging studies  CBC: Recent Labs  Lab 01/10/22 1633 01/11/22 0409 01/13/22 0407 01/14/22 0421  WBC 4.1 5.3 4.7 5.1  NEUTROABS 2.4  --  2.3 2.5  HGB 11.8* 10.7* 10.7* 10.4*  HCT 38.9 34.4* 33.9* 34.6*  MCV 84.7 83.9 83.3 84.4  PLT 335 294 282 273   Basic Metabolic Panel: Recent Labs  Lab 01/10/22 1633 01/11/22 0409 01/13/22 0407 01/14/22 0421  NA 137 143 144 141  K 4.1 3.8 3.8 3.7  CL 104 109 107 107  CO2 24 28 31 28   GLUCOSE 231* 129* 108* 94  BUN 10 8 9 9   CREATININE 0.63 0.61 0.62 0.59  CALCIUM 9.0 8.9 8.8* 8.6*  MG  --   --  1.8 2.3  PHOS  --   --  4.0 3.8   GFR: Estimated Creatinine Clearance: 57.5 mL/min (by C-G formula based on SCr of 0.59 mg/dL). Liver Function Tests: Recent Labs  Lab 01/11/22 0409 01/13/22 0407 01/14/22 0421  AST 16 21 22   ALT 15 17 17   ALKPHOS 102 117 119  BILITOT 0.2* 0.5 0.5  PROT 5.5* 5.7* 5.6*  ALBUMIN 3.1* 3.3* 3.2*   No results for input(s): "LIPASE", "AMYLASE" in the last 168 hours. No results for input(s): "AMMONIA" in the last 168 hours. Coagulation Profile: No results for input(s): "INR", "PROTIME" in the last 168 hours. Cardiac Enzymes: No results for input(s): "CKTOTAL", "CKMB", "CKMBINDEX", "TROPONINI" in the last 168 hours. BNP (last 3 results) No results for input(s): "PROBNP" in the last 8760 hours. HbA1C: No results for  input(s): "HGBA1C" in the last 72 hours. CBG: Recent Labs  Lab 01/13/22 1150 01/13/22 1633 01/13/22 2044 01/14/22 0732 01/14/22 1215  GLUCAP 106* 192* 207* 83 155*   Lipid Profile: No results for input(s): "CHOL", "HDL", "LDLCALC", "TRIG", "CHOLHDL", "LDLDIRECT" in the last 72 hours. Thyroid Function Tests: No results for input(s): "TSH", "T4TOTAL", "FREET4", "T3FREE", "THYROIDAB" in the last 72 hours. Anemia Panel: Recent Labs  01/14/22 0421  VITAMINB12 78*  FOLATE 26.2  FERRITIN 11  TIBC 339  IRON 24*  RETICCTPCT 2.0   Sepsis Labs: Recent Labs  Lab 01/10/22 1633 01/10/22 1948  LATICACIDVEN 2.6* 2.4*    Recent Results (from the past 240 hour(s))  Blood culture (routine x 2)     Status: None (Preliminary result)   Collection Time: 01/10/22 10:16 PM   Specimen: BLOOD  Result Value Ref Range Status   Specimen Description   Final    BLOOD LEFT ANTECUBITAL Performed at [facility], [address], [place], [place] [address]    Special Requests   Final    BOTTLES DRAWN AEROBIC AND ANAEROBIC Blood Culture results may not be optimal due to an inadequate volume of blood received in culture bottles Performed at [facility], [address] [place], [place] [address]    Culture   Final    NO GROWTH 3 DAYS Performed at [facility], [address] [place], [place] [address]    Report Status PENDING  Incomplete  Blood culture (routine x 2)     Status: None (Preliminary result)   Collection Time: 01/11/22  4:09 AM   Specimen: BLOOD  Result Value Ref Range Status   Specimen Description   Final    BLOOD LEFT ANTECUBITAL Performed at [facility], [address] [place], [place] [address]    Special Requests   Final    BOTTLES DRAWN AEROBIC ONLY Blood Culture adequate volume Performed at [facility], [address], [place], [place] [address]    Culture   Final    NO GROWTH 3  DAYS Performed at [facility], [address], [place], [place] [address]    Report Status PENDING  Incomplete     Radiology Studies: VAS [place] LOWER EXTREMITY VENOUS (DVT)  Result Date: 01/14/2022  Lower Venous DVT Study Patient Name:  LASHUNTA FRIEDEN  Date of Exam:   01/14/2022 Medical Rec #: [id number]         Accession #:    [id number] Date of Birth: July 21, 1950         Patient Gender: F Patient Age:   67 years Exam Location:  [facility] Procedure:      VAS [place] LOWER EXTREMITY VENOUS (DVT) Referring Phys: Ovid Curd --------------------------------------------------------------------------------  Indications: Pain.  Risk Factors: Surgery. Limitations: Poor ultrasound/tissue interface. Comparison Study: No prior studies. Performing Technologist: Chanda Busing RVT  Examination Guidelines: A complete evaluation includes B-mode imaging, spectral Doppler, color Doppler, and power Doppler as needed of all accessible portions of each vessel. Bilateral testing is considered an integral part of a complete examination. Limited examinations for reoccurring indications may be performed as noted. The reflux portion of the exam is performed with the patient in reverse Trendelenburg.  +-----+---------------+---------+-----------+----------+--------------+ RIGHTCompressibilityPhasicitySpontaneityPropertiesThrombus Aging +-----+---------------+---------+-----------+----------+--------------+ CFV  Full           Yes      Yes                                 +-----+---------------+---------+-----------+----------+--------------+   +---------+---------------+---------+-----------+----------+--------------+ LEFT     CompressibilityPhasicitySpontaneityPropertiesThrombus Aging +---------+---------------+---------+-----------+----------+--------------+ CFV      Full           Yes      Yes                                  +---------+---------------+---------+-----------+----------+--------------+  SFJ      Full                                                        +---------+---------------+---------+-----------+----------+--------------+ FV Prox  Full                                                        +---------+---------------+---------+-----------+----------+--------------+ FV Mid   Full                                                        +---------+---------------+---------+-----------+----------+--------------+ FV DistalFull                                                        +---------+---------------+---------+-----------+----------+--------------+ PFV      Full                                                        +---------+---------------+---------+-----------+----------+--------------+ POP      Full           Yes      Yes                                 +---------+---------------+---------+-----------+----------+--------------+ PTV      Full                                                        +---------+---------------+---------+-----------+----------+--------------+ PERO     Full                                                        +---------+---------------+---------+-----------+----------+--------------+    Summary: RIGHT: - No evidence of common femoral vein obstruction.  LEFT: - There is no evidence of deep vein thrombosis in the lower extremity.  - No cystic structure found in the popliteal fossa.  *See table(s) above for measurements and observations. Electronically signed by Heath Lark on 01/14/2022 at 12:22:45 PM.    Final     Scheduled Meds:  aspirin EC  81 mg Oral Daily   citalopram  20 mg Oral Daily   enoxaparin (LOVENOX) injection  40 mg Subcutaneous Q24H   gabapentin  1,200 mg Oral BID   insulin aspart  0-15 Units Subcutaneous TID WC   insulin  aspart  0-5 Units Subcutaneous QHS   insulin glargine-yfgn  30 Units Subcutaneous QHS    iron polysaccharides  150 mg Oral Daily   levothyroxine  100 mcg Oral QHS   lisinopril  2.5 mg Oral Daily   multivitamin-lutein  1 capsule Oral Daily   pantoprazole  40 mg Oral Daily   pravastatin  40 mg Oral Daily   senna-docusate  3 tablet Oral Q0600   sodium chloride flush  3 mL Intravenous Q12H   [START ON 01/15/2022] vitamin B-12  1,000 mcg Oral Daily   Continuous Infusions:  cefTRIAXone (ROCEPHIN)  IV Stopped (01/13/22 1854)   vancomycin 750 mg (01/13/22 2131)    LOS: 3 days   Marguerita Merles, DO [facility] Available via Epic secure chat 7am-7pm After these hours, please refer to coverage provider listed on [email or web address] 01/14/2022, 3:55 PM

## 2022-01-14 NOTE — Progress Notes (Signed)
Left lower extremity venous duplex has been completed. Preliminary results can be found in CV Proc through chart review.   01/14/22 9:35 AM Olen Cordial RVT

## 2022-01-14 NOTE — Progress Notes (Signed)
Subjective: States she is still having pain, pain medication helps. States occasionally she will get chills but no fever. She did get up to go to the bathroom without the surgical shoe on. She also reports some left calf pain and she is concerned as she has a history of DVT.    Objective: AAO x3, NAD-seems in better spirits today. DP/PT pulses palpable bilaterally, CRT less than 3 seconds Incisions coapted.  Still some slight erythema dorsal aspect of foot.  There is no erythema plantar aspect or going up to the ankle.  There is minimal increased temperature compared to the contralateral extremity.  There is no fluctuation or crepitation but there is no malodor. No pain with calf compression, swelling, warmth, erythema  Assessment: Status post left foot surgery with improving cellulitis  Plan: -WBC is 5.1, afebrile.  -Venous duplex ordered. Given surgery and immobilization recommend DVT prophylaxis.  -Encouraged ice, elevation. -She is weightbearing as tolerated in surgical shoe but we discussed the importance of trying to limit weightbearing.  Also discussed when she is up and walking she needs to wear the surgical shoe. -From podiatry standpoint able be discharged with oral antibiotics.  Consider switching to Bactrim, Cipro for a week. Currently pending SNF.  She will follow-up with [place] in the office upon discharge.  Ovid Curd, DPM

## 2022-01-15 DIAGNOSIS — F339 Major depressive disorder, recurrent, unspecified: Secondary | ICD-10-CM | POA: Diagnosis not present

## 2022-01-15 DIAGNOSIS — K2100 Gastro-esophageal reflux disease with esophagitis, without bleeding: Secondary | ICD-10-CM | POA: Diagnosis not present

## 2022-01-15 DIAGNOSIS — E1159 Type 2 diabetes mellitus with other circulatory complications: Secondary | ICD-10-CM | POA: Diagnosis not present

## 2022-01-15 DIAGNOSIS — E039 Hypothyroidism, unspecified: Secondary | ICD-10-CM | POA: Diagnosis not present

## 2022-01-15 LAB — CBC WITH DIFFERENTIAL/PLATELET
Abs Immature Granulocytes: 0.01 10*3/uL (ref 0.00–0.07)
Basophils Absolute: 0 10*3/uL (ref 0.0–0.1)
Basophils Relative: 1 %
Eosinophils Absolute: 0.1 10*3/uL (ref 0.0–0.5)
Eosinophils Relative: 2 %
HCT: 36.3 % (ref 36.0–46.0)
Hemoglobin: 11 g/dL — ABNORMAL LOW (ref 12.0–15.0)
Immature Granulocytes: 0 %
Lymphocytes Relative: 42 %
Lymphs Abs: 2.1 10*3/uL (ref 0.7–4.0)
MCH: 25.8 pg — ABNORMAL LOW (ref 26.0–34.0)
MCHC: 30.3 g/dL (ref 30.0–36.0)
MCV: 85.2 fL (ref 80.0–100.0)
Monocytes Absolute: 0.6 10*3/uL (ref 0.1–1.0)
Monocytes Relative: 12 %
Neutro Abs: 2.1 10*3/uL (ref 1.7–7.7)
Neutrophils Relative %: 43 %
Platelets: 261 10*3/uL (ref 150–400)
RBC: 4.26 MIL/uL (ref 3.87–5.11)
RDW: 16.7 % — ABNORMAL HIGH (ref 11.5–15.5)
WBC: 4.9 10*3/uL (ref 4.0–10.5)
nRBC: 0 % (ref 0.0–0.2)

## 2022-01-15 LAB — COMPREHENSIVE METABOLIC PANEL
ALT: 19 U/L (ref 0–44)
AST: 20 U/L (ref 15–41)
Albumin: 3.6 g/dL (ref 3.5–5.0)
Alkaline Phosphatase: 132 U/L — ABNORMAL HIGH (ref 38–126)
Anion gap: 6 (ref 5–15)
BUN: 10 mg/dL (ref 8–23)
CO2: 29 mmol/L (ref 22–32)
Calcium: 8.8 mg/dL — ABNORMAL LOW (ref 8.9–10.3)
Chloride: 104 mmol/L (ref 98–111)
Creatinine, Ser: 0.55 mg/dL (ref 0.44–1.00)
GFR, Estimated: >60 mL/min (ref >60)
Glucose, Bld: 134 mg/dL — ABNORMAL HIGH (ref 70–99)
Potassium: 4.1 mmol/L (ref 3.5–5.1)
Sodium: 139 mmol/L (ref 135–145)
Total Bilirubin: 0.4 mg/dL (ref 0.3–1.2)
Total Protein: 6.5 g/dL (ref 6.5–8.1)

## 2022-01-15 LAB — GLUCOSE, CAPILLARY
Glucose-Capillary: 113 mg/dL — ABNORMAL HIGH (ref 70–99)
Glucose-Capillary: 132 mg/dL — ABNORMAL HIGH (ref 70–99)
Glucose-Capillary: 199 mg/dL — ABNORMAL HIGH (ref 70–99)
Glucose-Capillary: 123 mg/dL — ABNORMAL HIGH (ref 70–99)

## 2022-01-15 LAB — MAGNESIUM: Magnesium: 2.1 mg/dL (ref 1.7–2.4)

## 2022-01-15 LAB — PHOSPHORUS: Phosphorus: 4.2 mg/dL (ref 2.5–4.6)

## 2022-01-15 NOTE — TOC Progression Note (Signed)
Transition of Care [facility]) - Progression Note    Patient Details  Name: ASYA DERRYBERRY MRN: [id number] Date of Birth: August 23, 1950  [facility]) CM/SW Contact  Golda Acre, RN Phone Number: 01/15/2022, 8:22 AM  Clinical Narrative:    Will need snf placement.  Unable to get passar number until [phone].  Passar office closed until [phone]   Expected Discharge Plan: Home w Home Health Services Barriers to Discharge: Continued Medical Work up  Expected Discharge Plan and Services Expected Discharge Plan: Home w Home Health Services   Discharge Planning Services: CM Consult   Living arrangements for the past 2 months: Single Family Home                                       Social Determinants of Health (SDOH) Interventions    Readmission Risk Interventions     No data to display

## 2022-01-15 NOTE — TOC Progression Note (Addendum)
Transition of Care [facility]) - Progression Note    Patient Details  Name: MILINA PAGETT MRN: [id number] Date of Birth: [birth year]  Transition of Care [facility]) CM/SW Contact  Golda Acre, RN Phone Number: 01/15/2022, 12:10 PM  Clinical Narrative:    Discussed and reviewed what snf beds are available to the patient.  She would [facility] to [facility] possible or [facility] [place].  [facility] a bed by Wednesday will check and call back. [phone][facility].  Can make bed auth.  Will get auth for insurance started.  Will be Wednesday [phone] before bed is available   Expected Discharge Plan: Home w Home Health Services Barriers to Discharge: Continued Medical Work up  Expected Discharge Plan and Services Expected Discharge Plan: Home w Home Health Services   Discharge Planning Services: CM Consult   Living arrangements for the past 2 months: Single Family Home                                       Social Determinants of Health (SDOH) Interventions    Readmission Risk Interventions     No data to display

## 2022-01-15 NOTE — TOC Progression Note (Signed)
Transition of Care [facility]) - Progression Note    Patient Details  Name: Marissa Thompson MRN: [id number] Date of Birth: [birth year]  Transition of Care [facility]) CM/SW Contact  Golda Acre, RN Phone Number: 01/15/2022, 11:25 AM  Clinical Narrative:    Linzie Collin at [facility] rehab can bed offer awaiting passar.   Expected Discharge Plan: Home w Home Health Services Barriers to Discharge: Continued Medical Work up  Expected Discharge Plan and Services Expected Discharge Plan: Home w Home Health Services   Discharge Planning Services: CM Consult   Living arrangements for the past 2 months: Single Family Home                                       Social Determinants of Health (SDOH) Interventions    Readmission Risk Interventions     No data to display

## 2022-01-15 NOTE — Progress Notes (Signed)
PODIATRY PROGRESS NOTE  NAME Marissa Thompson MRN [id number] DOB 1951/05/25 DOA 01/10/2022   CC: Postoperative infection LT foot Chief Complaint  Patient presents with   Post-op Problem   Foot Swelling   History of present illness: 71 y.o. female PMHx diabetes; controlled, HTN s/p LT foot surgery 12/21/2021.  Patient states that she is feeling much better.  She states that the redness and swelling to the foot have improved significantly since admission on IV antibiotics.  She also states that the pain has significantly improved.  No new complaints at this time mop mom  Past Medical History:  Diagnosis Date   Allergy    Anxiety 2003   Carpal tunnel syndrome, bilateral    Cough    for over a year per pt-   Depression 1998   Diabetes mellitus without complication (HCC) 1998   Dupuytren's contracture of left hand    DVT (deep venous thrombosis) (HCC) 2007   left calf x2   GERD (gastroesophageal reflux disease) 1980   History of alcoholism (HCC)    Chronic   HLD (hyperlipidemia)    Hyperlipidemia 1990   Hypertension    Hypothyroidism 1998   Macular degeneration 07/31/2018   2020   Macular degeneration 12/2019   PONV (postoperative nausea and vomiting)    1 time per pt in 2001   Short-term memory loss    SOB (shortness of breath) on exertion        Latest Ref Rng & Units 01/15/2022    3:43 AM 01/14/2022    4:21 AM 01/13/2022    4:07 AM  CBC  WBC 4.0 - 10.5 K/uL 4.9  5.1  4.7   Hemoglobin 12.0 - 15.0 g/dL 28.7  68.1  15.7   Hematocrit 36.0 - 46.0 % 36.3  34.6  33.9   Platelets 150 - 400 K/uL 261  273  282        Latest Ref Rng & Units 01/15/2022    3:43 AM 01/14/2022    4:21 AM 01/13/2022    4:07 AM  BMP  Glucose 70 - 99 mg/dL 262  94  035   BUN 8 - 23 mg/dL 10  9  9    Creatinine 0.44 - 1.00 mg/dL  5.97  4.16   Sodium 135 - 145 mmol/L 139  141  144   Potassium 3.5 - 5.1 mmol/L 4.1  3.7  3.8   Chloride 98 - 111 mmol/L 104  107  107   CO2 22 - 32 mmol/L 29  28  31     Calcium 8.9 - 10.3 mg/dL 8.8  8.6  8.8        Physical Exam: General: The patient is alert and oriented x3 in no acute distress.   Dermatology: Skin incisions completely healed.  Vascular: Palpable pedal pulses bilaterally.  Capillary refill immediate.  Greatly improved erythema.  Moderate edema continues throughout the foot  Neurological: Epicritic and protective threshold grossly intact bilaterally.   Musculoskeletal Exam: S/P LT foot surgery 12/21/2021    ASSESSMENT/PLAN OF CARE S/P postoperative infection LT foot.  DOS: 12/21/2021 -Significant improvement.  No dressings required to the foot -Plan to discharge to SNF.  Pending approval -Patient may weight-bear in the postsurgical shoe as tolerated with the assistance of a walker -Discharge on oral p.o. antibiotics, h/o N+V w/ doxycycline. Patient toleratated Bactrim DS well prior to admission.  -Patient okay to be discharged from a podiatry standpoint.  Follow-up in office 2 weeks post discharge  for follow-up x-ray and evaluation   Felecia Shelling, DPM [facility]  Dr. Felecia Shelling, DPM    [address] [place], [place] [address]                Office [phone]  Fax [phone]

## 2022-01-15 NOTE — Care Management Important Message (Signed)
Important Message  Patient Details IM Letter given to the Patient. Name: SHEREDA GRAW MRN: [id number] Date of Birth: [birth year]   Medicare Important Message Given:  Yes     Caren Macadam 01/15/2022, 1:15 PM

## 2022-01-15 NOTE — Progress Notes (Signed)
PROGRESS NOTE    Marissa Thompson  [id number] DOB: Nov 22, 1950 DOA: 01/10/2022 PCP: Leamon Arnt, MD   Brief Narrative:  Marissa Thompson is a 71 y.o. female with medical history significant of hypertension, diabetes, GERD, hyperlipidemia, hypothyroidism, depression, RLS, macular degeneration, bunionectomy 6/8 presenting with a week of foot pain redness and swelling. She was admitted for concern of postoperative infection vs. osteomyelitis with bone scan and podiatry consult.  Podiatry evaluated and reviewed the bone scans and based on her blood work doubted that she has osteomyelitis at this time and it is secondary to most likely postoperative changes.  Patient had a fall last week and podiatry recommend starting IV antibiotics for soft tissue infection for now and then discharging on oral antibiotics.  After further review will likely change her to cefadroxil plus Bactrim.  PT evaluating and recommending SNF given her multiple falls.  We feel she would benefit from skilled nursing facility for further rehabilitative efforts and she is working with physical therapy and podiatry evaluated and feels that her inflammatory markers are reassuring and recommending that she is limiting her activity icing and elevating her foot and leg.  They are recommending continue IV antibiotics for now and discharging with oral antibiotics given that her cellulitis is improving.  Patient is stable for discharge to SNF and will change to p.o. cefadroxil and Bactrim at discharge however insurance authorization and PASRR is still pending.    Assessment and Plan:  Postoperative soft tissue infection and Cellulitis, possible osteomyelitis ruled out  -Systemic inflammatory markers thus far are reassuring, bone scan done and showed "Increased blood flow, blood pool, and delayed uptake of tracer at operative sites in the LEFT foot consistent with postoperative changes. Due to the recent time of surgery, unable to exclude  or confirm osteomyelitis at the operative sites."  -Per discussion with podiatry, If it is negative then would like to give her IV antibiotics while hospitalized then send home with oral.  -If positive or need to do biopsy  but bone Scan was negative -Blood cultures x2 show NO Growth at 3 Days still  -CRP 0.6 -Lactic acid level went from 2.6 is now 2.4 -Continue pain control as ordered with Acetaminophen 650 mg po/RC and Hydromorphone 0.5 mg IV q3hprn Severe and Moderate Pain. Will add Oxycodone IR 5 mg po q6hprn for Moderate Pain for po pain Control  -Per podiatry she is weightbearing in a surgical shoe but they prefer to be in a boot -Patient continued to be afebrile had no leukocytosis -Given that she has been immobile and given her recent surgery venous duplex was ordered and showed no evidence of DVT and Dr. Earleen Newport recommended DVT prophylaxis so we have initiated Lovenox injections and resumed her aspirin -Podiatry recommends that she is weightbearing as tolerated in surgical shoe but they have discussed the importance of trying to limit weightbearing and also discussed that when she is up and walking that she needs to wear her surgical shoe.  From a podiatry parents point they are recommending oral antibiotics and consider switching to Bactrim and Cipro however we will change to Bactrim and cefadroxil likely -PT/OT ordered and recommending SNF and pending placement; Bed Offer accepted but awaiting PASRR   Hypertension -Continue home Lisinopril 2.5 mg p.o. daily -Continue to Monitor BP per Protocol -Last BP reading is 110/52   Hyperlipidemia -Continue home Pravastatin 40 mg p.o. daily   Depression Anxiety - Continue home Citalopram, 20 mg po daily and as needed Clonazepam  0.5 milligram p.o. every 8 hours as as needed   RLS - Continue home Gabapentin 1200 mg p.o. twice daily   Hypothyroidism - Continue home Levothyroxine 100 mcg p.o. nightly   Hypoalbuminemia -Patient's albumin  level went from 3.1 -> 3.3 and is now 3.2 -Continue monitor and trend and repeat CMP in the a.m.   Normocytic Anemia -Patient's hemoglobin/hematocrit went from 11.8/38.9 -> 10.7/34.4 -> 10.7/33.9 -> 10.4/34.6 -> 11.0/36.3 -Anemia panel done and showed an iron level of 24, U IBC of 315, TIBC of 339, saturation of 7%, ferritin level 11%, follow-up 26.2 and a vitamin B12 of 78 -We will start repletion and start Niferex 150 mg p.o. daily and she will receive oh vitamin B-12 1000 mcg injection today and then start 1000 mcg p.o. tomorrow -Continue to monitor for signs and symptoms bleeding; no overt bleeding noted -Repeat CBC in the AM   IDT2DM:  -Continue with 30 units long-acting insulin and SSI -CBG's ranging from 113-236 -Continue to monitor blood sugars carefully and adjust insulin regimen as necessary   GERD/GI Prophylaxis  - Continue home PPI with Pantoprazole 40 mg p.o. daily   Obesity -Complicates overall prognosis and care -Estimated body mass index is 32.19 kg/m as calculated from the following:   Height as of this encounter: 4\' 11"  (1.499 m).   Weight as of this encounter: 72.3 kg.  -Weight Loss and Dietary Counseling given  DVT prophylaxis: enoxaparin (LOVENOX) injection 40 mg Start: 01/14/22 1600 SCDs Start: 01/10/22 2137    Code Status: Full Code Family Communication: No family present at bedside   Disposition Plan:  Level of care: Med-Surg Status is: Inpatient Remains inpatient appropriate because: Needs SNF and PASRR is pending    Consultants:  Podiatry   Procedures:  Lower extremity venous duplex on the left Summary:  RIGHT:  - No evidence of common femoral vein obstruction.     LEFT:  - There is no evidence of deep vein thrombosis in the lower extremity.     - No cystic structure found in the popliteal fossa.   Antimicrobials:  Anti-infectives (From admission, onward)    Start     Dose/Rate Route Frequency Ordered Stop   01/12/22 2000  vancomycin  (VANCOREADY) IVPB 750 mg/150 mL        750 mg 150 mL/hr over 60 Minutes Intravenous Every 24 hours 01/11/22 1844     01/11/22 1930  cefTRIAXone (ROCEPHIN) 1 g in sodium chloride 0.9 % 100 mL IVPB       Note to Pharmacy: has tolerated keflex   1 g 200 mL/hr over 30 Minutes Intravenous Every 24 hours 01/11/22 1824     01/11/22 1930  vancomycin (VANCOCIN) IVPB 1000 mg/200 mL premix        1,000 mg 200 mL/hr over 60 Minutes Intravenous  Once 01/11/22 1844 01/11/22 2024       Subjective: Seen and examined at bedside and states she is feeling better. No Nausea or vomiting. States foot hurts when she wakes up and when she moves it but with analgesics it drops the pain level from a 7/10 to a 3/10. No other concerns or complaints at this time.   Objective: Vitals:   01/14/22 0510 01/14/22 1242 01/14/22 2035 01/15/22 0337  BP: (!) 122/55 (!) 106/51 125/62 (!) 110/52  Pulse: 78 81 77 77  Resp: 18 20 18 18   Temp: 98.4 F (36.9 C) 98.2 F (36.8 C) 98.9 F (37.2 C) 98 F (36.7 C)  TempSrc: Oral  Oral Oral Oral  SpO2: 92%  100% 95%  Weight:    72.3 kg  Height:        Intake/Output Summary (Last 24 hours) at 01/15/2022 1128 Last data filed at 01/14/2022 1800 Gross per 24 hour  Intake 686.67 ml  Output --  Net 686.67 ml   Filed Weights   01/13/22 0544 01/14/22 0500 01/15/22 0337  Weight: 71 kg 72.4 kg 72.3 kg   Examination: Physical Exam:  Constitutional: WN/WD obese Caucasian female in NAD appears better this AM  Respiratory: Diminished to auscultation bilaterally with coarse breath sounds, no wheezing, rales, rhonchi or crackles. Normal respiratory effort and patient is not tachypenic. No accessory muscle use. Unlabored Breathing and not wearing any supplemental O2 via [place] Cardiovascular: RRR, no murmurs / rubs / gallops. S1 and S2 auscultated. Mild edema on the Left foot Abdomen: Soft, non-tender, Distended 2/2 body habitus. Bowel sounds positive.  GU: Deferred. Skin: Left foot is  improving and not as swollen or erythematous  Neurologic: CN 2-12 grossly intact with no focal deficits. Romberg sign and cerebellar reflexes not assessed.  Psychiatric: Normal judgment and insight. Alert and oriented x 3. Normal mood and appropriate affect.   Data Reviewed: I have personally reviewed following labs and imaging studies  CBC: Recent Labs  Lab 01/10/22 1633 01/11/22 0409 01/13/22 0407 01/14/22 0421 01/15/22 0343  WBC 4.1 5.3 4.7 5.1 4.9  NEUTROABS 2.4  --  2.3 2.5 2.1  HGB 11.8* 10.7* 10.7* 10.4* 11.0*  HCT 38.9 34.4* 33.9* 34.6* 36.3  MCV 84.7 83.9 83.3 84.4 85.2  PLT 335 294 282 273 0000000   Basic Metabolic Panel: Recent Labs  Lab 01/10/22 1633 01/11/22 0409 01/13/22 0407 01/14/22 0421 01/15/22 0343  NA 137 143 144 141 139  K 4.1 3.8 3.8 3.7 4.1  CL 104 109 107 107 104  CO2 24 28 31 28 29   GLUCOSE 231* 129* 108* 94 134*  BUN 10 8 9 9 10   CREATININE 0.63 0.61 0.62 0.59 0.55  CALCIUM 9.0 8.9 8.8* 8.6* 8.8*  MG  --   --  1.8 2.3 2.1  PHOS  --   --  4.0 3.8 4.2   GFR: Estimated Creatinine Clearance: 57.4 mL/min (by C-G formula based on SCr of 0.55 mg/dL). Liver Function Tests: Recent Labs  Lab 01/11/22 0409 01/13/22 0407 01/14/22 0421 01/15/22 0343  AST 16 21 22 20   ALT 15 17 17 19   ALKPHOS 102 117 119 132*  BILITOT 0.2* 0.5 0.5 0.4  PROT 5.5* 5.7* 5.6* 6.5  ALBUMIN 3.1* 3.3* 3.2* 3.6   No results for input(s): "LIPASE", "AMYLASE" in the last 168 hours. No results for input(s): "AMMONIA" in the last 168 hours. Coagulation Profile: No results for input(s): "INR", "PROTIME" in the last 168 hours. Cardiac Enzymes: No results for input(s): "CKTOTAL", "CKMB", "CKMBINDEX", "TROPONINI" in the last 168 hours. BNP (last 3 results) No results for input(s): "PROBNP" in the last 8760 hours. HbA1C: No results for input(s): "HGBA1C" in the last 72 hours. CBG: Recent Labs  Lab 01/14/22 0732 01/14/22 1215 01/14/22 1630 01/14/22 2058 01/15/22 0759   GLUCAP 83 155* 122* 236* 113*   Lipid Profile: No results for input(s): "CHOL", "HDL", "LDLCALC", "TRIG", "CHOLHDL", "LDLDIRECT" in the last 72 hours. Thyroid Function Tests: No results for input(s): "TSH", "T4TOTAL", "FREET4", "T3FREE", "THYROIDAB" in the last 72 hours. Anemia Panel: Recent Labs    01/14/22 0421  VITAMINB12 78*  FOLATE 26.2  FERRITIN 11  TIBC 339  IRON 24*  RETICCTPCT 2.0   Sepsis Labs: Recent Labs  Lab 01/10/22 1633 01/10/22 1948  LATICACIDVEN 2.6* 2.4*    Recent Results (from the past 240 hour(s))  Blood culture (routine x 2)     Status: None (Preliminary result)   Collection Time: 01/10/22 10:16 PM   Specimen: BLOOD  Result Value Ref Range Status   Specimen Description   Final    BLOOD LEFT ANTECUBITAL Performed at [facility], [place] [address] [place], [place] [address]    Special Requests   Final    BOTTLES DRAWN AEROBIC AND ANAEROBIC Blood Culture results may not be optimal due to an inadequate volume of blood received in culture bottles Performed at [place] [address] [place], [place] [address]    Culture   Final    NO GROWTH 3 DAYS Performed at [facility], [place] [address] [place], [place] [address]    Report Status PENDING  Incomplete  Blood culture (routine x 2)     Status: None (Preliminary result)   Collection Time: 01/11/22  4:09 AM   Specimen: BLOOD  Result Value Ref Range Status   Specimen Description   Final    BLOOD LEFT ANTECUBITAL Performed at [place] [address], [place], [place] [address]    Special Requests   Final    BOTTLES DRAWN AEROBIC ONLY Blood Culture adequate volume Performed at [facility] [address], [place], [place] [address]    Culture   Final    NO GROWTH 3 DAYS Performed at [facility], [place] [address], [place], [place] [address]    Report Status PENDING  Incomplete     Radiology  Studies: VAS [place] LOWER EXTREMITY VENOUS (DVT)  Result Date: 01/14/2022  Lower Venous DVT Study Patient Name:  BETZAYRA LAFOUNTAINE  Date of Exam:   01/14/2022 Medical Rec #: [id number]         Accession #:    [id number] Date of Birth: 1950/10/28         Patient Gender: F Patient Age:   35 years Exam Location:  [facility] Procedure:      VAS [place] LOWER EXTREMITY VENOUS (DVT) Referring Phys: Celesta Gentile --------------------------------------------------------------------------------  Indications: Pain.  Risk Factors: Surgery. Limitations: Poor ultrasound/tissue interface. Comparison Study: No prior studies. Performing Technologist: Oliver Hum RVT  Examination Guidelines: A complete evaluation includes B-mode imaging, spectral Doppler, color Doppler, and power Doppler as needed of all accessible portions of each vessel. Bilateral testing is considered an integral part of a complete examination. Limited examinations for reoccurring indications may be performed as noted. The reflux portion of the exam is performed with the patient in reverse Trendelenburg.  +-----+---------------+---------+-----------+----------+--------------+ RIGHTCompressibilityPhasicitySpontaneityPropertiesThrombus Aging +-----+---------------+---------+-----------+----------+--------------+ CFV  Full           Yes      Yes                                 +-----+---------------+---------+-----------+----------+--------------+   +---------+---------------+---------+-----------+----------+--------------+ LEFT     CompressibilityPhasicitySpontaneityPropertiesThrombus Aging +---------+---------------+---------+-----------+----------+--------------+ CFV      Full           Yes      Yes                                 +---------+---------------+---------+-----------+----------+--------------+ SFJ  Full                                                         +---------+---------------+---------+-----------+----------+--------------+ FV Prox  Full                                                        +---------+---------------+---------+-----------+----------+--------------+ FV Mid   Full                                                        +---------+---------------+---------+-----------+----------+--------------+ FV DistalFull                                                        +---------+---------------+---------+-----------+----------+--------------+ PFV      Full                                                        +---------+---------------+---------+-----------+----------+--------------+ POP      Full           Yes      Yes                                 +---------+---------------+---------+-----------+----------+--------------+ PTV      Full                                                        +---------+---------------+---------+-----------+----------+--------------+ PERO     Full                                                        +---------+---------------+---------+-----------+----------+--------------+    Summary: RIGHT: - No evidence of common femoral vein obstruction.  LEFT: - There is no evidence of deep vein thrombosis in the lower extremity.  - No cystic structure found in the popliteal fossa.  *See table(s) above for measurements and observations. Electronically signed by Jamelle Haring on 01/14/2022 at 12:22:45 PM.    Final      Scheduled Meds:  aspirin EC  81 mg Oral Daily   citalopram  20 mg Oral Daily   enoxaparin (LOVENOX) injection  40 mg Subcutaneous Q24H   gabapentin  1,200 mg Oral BID   insulin aspart  0-15 Units Subcutaneous TID WC   insulin aspart  0-5 Units  Subcutaneous QHS   insulin glargine-yfgn  30 Units Subcutaneous QHS   iron polysaccharides  150 mg Oral Daily   levothyroxine  100 mcg Oral QHS   lisinopril  2.5 mg Oral Daily   multivitamin  1 tablet Oral Daily    pantoprazole  40 mg Oral Daily   pravastatin  40 mg Oral Daily   senna-docusate  3 tablet Oral Q0600   sodium chloride flush  3 mL Intravenous Q12H   vitamin B-12  1,000 mcg Oral Daily   Continuous Infusions:  cefTRIAXone (ROCEPHIN)  IV Stopped (01/14/22 1906)   vancomycin 750 mg (01/14/22 2120)     LOS: 4 days   Marguerita Merles, DO [facility] Available via Epic secure chat 7am-7pm After these hours, please refer to coverage provider listed on [email or web address] 01/15/2022, 11:28 AM

## 2022-01-16 DIAGNOSIS — K2100 Gastro-esophageal reflux disease with esophagitis, without bleeding: Secondary | ICD-10-CM | POA: Diagnosis not present

## 2022-01-16 DIAGNOSIS — E039 Hypothyroidism, unspecified: Secondary | ICD-10-CM | POA: Diagnosis not present

## 2022-01-16 DIAGNOSIS — E1159 Type 2 diabetes mellitus with other circulatory complications: Secondary | ICD-10-CM | POA: Diagnosis not present

## 2022-01-16 DIAGNOSIS — F339 Major depressive disorder, recurrent, unspecified: Secondary | ICD-10-CM | POA: Diagnosis not present

## 2022-01-16 LAB — CULTURE, BLOOD (ROUTINE X 2)
Culture: NO GROWTH
Culture: NO GROWTH
Special Requests: ADEQUATE

## 2022-01-16 LAB — COMPREHENSIVE METABOLIC PANEL
ALT: 16 U/L (ref 0–44)
AST: 18 U/L (ref 15–41)
Albumin: 3.4 g/dL — ABNORMAL LOW (ref 3.5–5.0)
Alkaline Phosphatase: 121 U/L (ref 38–126)
Anion gap: 8 (ref 5–15)
BUN: 11 mg/dL (ref 8–23)
CO2: 28 mmol/L (ref 22–32)
Calcium: 8.7 mg/dL — ABNORMAL LOW (ref 8.9–10.3)
Chloride: 106 mmol/L (ref 98–111)
Creatinine, Ser: 0.52 mg/dL (ref 0.44–1.00)
GFR, Estimated: >60 mL/min (ref >60)
Glucose, Bld: 118 mg/dL — ABNORMAL HIGH (ref 70–99)
Potassium: 3.9 mmol/L (ref 3.5–5.1)
Sodium: 142 mmol/L (ref 135–145)
Total Bilirubin: 0.4 mg/dL (ref 0.3–1.2)
Total Protein: 5.8 g/dL — ABNORMAL LOW (ref 6.5–8.1)

## 2022-01-16 LAB — CBC WITH DIFFERENTIAL/PLATELET
Abs Immature Granulocytes: 0.01 10*3/uL (ref 0.00–0.07)
Basophils Absolute: 0 10*3/uL (ref 0.0–0.1)
Basophils Relative: 1 %
Eosinophils Absolute: 0.1 10*3/uL (ref 0.0–0.5)
Eosinophils Relative: 2 %
HCT: 33.3 % — ABNORMAL LOW (ref 36.0–46.0)
Hemoglobin: 10.2 g/dL — ABNORMAL LOW (ref 12.0–15.0)
Immature Granulocytes: 0 %
Lymphocytes Relative: 40 %
Lymphs Abs: 1.9 10*3/uL (ref 0.7–4.0)
MCH: 25.7 pg — ABNORMAL LOW (ref 26.0–34.0)
MCHC: 30.6 g/dL (ref 30.0–36.0)
MCV: 83.9 fL (ref 80.0–100.0)
Monocytes Absolute: 0.6 10*3/uL (ref 0.1–1.0)
Monocytes Relative: 12 %
Neutro Abs: 2.2 10*3/uL (ref 1.7–7.7)
Neutrophils Relative %: 45 %
Platelets: 236 10*3/uL (ref 150–400)
RBC: 3.97 MIL/uL (ref 3.87–5.11)
RDW: 16.5 % — ABNORMAL HIGH (ref 11.5–15.5)
WBC: 4.8 10*3/uL (ref 4.0–10.5)
nRBC: 0 % (ref 0.0–0.2)

## 2022-01-16 LAB — GLUCOSE, CAPILLARY
Glucose-Capillary: 96 mg/dL (ref 70–99)
Glucose-Capillary: 142 mg/dL — ABNORMAL HIGH (ref 70–99)
Glucose-Capillary: 202 mg/dL — ABNORMAL HIGH (ref 70–99)
Glucose-Capillary: 188 mg/dL — ABNORMAL HIGH (ref 70–99)

## 2022-01-16 LAB — PHOSPHORUS: Phosphorus: 3.5 mg/dL (ref 2.5–4.6)

## 2022-01-16 LAB — MAGNESIUM: Magnesium: 2 mg/dL (ref 1.7–2.4)

## 2022-01-16 MED ORDER — BISACODYL 10 MG RE SUPP
10.0000 mg | Freq: Every day | RECTAL | Status: DC | PRN
  Administered 2022-01-16: 10 mg via RECTAL
  Filled 2022-01-16: qty 1

## 2022-01-16 MED ORDER — MAGNESIUM HYDROXIDE 400 MG/5ML PO SUSP
15.0000 mL | Freq: Every day | ORAL | Status: DC | PRN
  Administered 2022-01-16: 15 mL via ORAL
  Filled 2022-01-16: qty 30

## 2022-01-16 NOTE — Progress Notes (Signed)
Physical Therapy Treatment Patient Details Name: Marissa Thompson MRN: [id number] DOB: 1951-07-06 Today's Date: 01/16/2022  History of Present Illness  Patient is 71 y.o. female admitted 01/10/22 wtih cellulitis. Pt admitted with concern for postoperative infection vs. osteo, podietry consulted and feel most likely post operative change as pt is s/p Lt foot bunionectomy on 12/21/21. PMH significant for HTN, DM, HLD, GERD, hypothyroidism, depression, RLS, macular degeneration.  Clinical Impression  Pt is progressing well with therapy. She completed ambulation and exercises. Currently pt requires Supervision for bed mobility, transfers, and gait. Pt is limited by muscle weakness and recovering infection from left foot. Pt will benefit from continued skilled therapy. Recommending SNF or  HHPT to address above impairments. Will continue to follow in acute setting.       Recommendations for follow up therapy are one component of a multi-disciplinary discharge planning process, led by the attending physician.  Recommendations may be updated based on patient status, additional functional criteria and insurance authorization.  Follow Up Recommendations Skilled nursing-short term rehab (<3 hours/day) Can patient physically be transported by private vehicle: Yes    Assistance Recommended at Discharge Intermittent Supervision/Assistance  Patient can return home with the following  A little help with walking and/or transfers;A little help with bathing/dressing/bathroom;Assist for transportation    Equipment Recommendations None recommended by PT  Recommendations for Other Services       Functional Status Assessment       Precautions / Restrictions Restrictions Weight Bearing Restrictions: No LLE Weight Bearing: Weight bearing as tolerated      Mobility  Bed Mobility Overal bed mobility: Needs Assistance Bed Mobility: Supine to Sit     Supine to sit: Supervision Sit to supine: Supervision    General bed mobility comments: Supervision for safety. Needed assistance getting boot on.    Transfers Overall transfer level: Needs assistance Equipment used: Rolling walker (2 wheels) Transfers: Sit to/from Stand Sit to Stand: Supervision           General transfer comment: Supervision for safety, no cues needed.     Ambulation/Gait Ambulation/Gait assistance: Supervision Gait Distance (Feet): 200 Feet Assistive device: Rolling walker (2 wheels) Gait Pattern/deviations: WFL(Within Functional Limits), Step-through pattern, Decreased stride length Gait velocity: decr     General Gait Details: Pt ambulates with increased time due to pain in lt foot. Able to ambulate safely with boot. Pt needed cues to navigate obstacles with RW.   Stairs            Wheelchair Mobility    Modified Rankin (Stroke Patients Only)       Balance Overall balance assessment: Needs assistance Sitting-balance support: No upper extremity supported Sitting balance-Leahy Scale: Good     Standing balance support: Bilateral upper extremity supported, During functional activity, Reliant on assistive device for balance Standing balance-Leahy Scale: Fair Standing balance comment: Pt needs RW to balance in standing,                             Pertinent Vitals/Pain Pain Assessment Pain Assessment: 0-10 Pain Score: 5  Pain Location: Bottom of Lt foot. Pain Descriptors / Indicators: Aching, Discomfort Pain Intervention(s): Monitored during session, Repositioned    Home Living                          Prior Function  Hand Dominance        Extremity/Trunk Assessment                Communication      Cognition Arousal/Alertness: Awake/alert Behavior During Therapy: WFL for tasks assessed/performed Overall Cognitive Status: Within Functional Limits for tasks assessed                                 General  Comments: Pt is compliant with all tasks, and pleasant to talk with.        General Comments      Exercises General Exercises - Lower Extremity Ankle Circles/Pumps: AROM, 20 reps, Seated, Both Quad Sets: AROM, Strengthening, Both, 10 reps Hip ABduction/ADduction: AROM, 10 reps, Seated   Assessment/Plan    PT Assessment    PT Problem List         PT Treatment Interventions      PT Goals (Current goals can be found in the Care Plan section)  Acute Rehab PT Goals Patient Stated Goal: To get better PT Goal Formulation: With patient Time For Goal Achievement: 01/26/22 Potential to Achieve Goals: Good    Frequency Min 3X/week     Co-evaluation               AM-PAC PT "6 Clicks" Mobility  Outcome Measure Help needed turning from your back to your side while in a flat bed without using bedrails?: None Help needed moving from lying on your back to sitting on the side of a flat bed without using bedrails?: None Help needed moving to and from a bed to a chair (including a wheelchair)?: None Help needed standing up from a chair using your arms (e.g., wheelchair or bedside chair)?: None Help needed to walk in hospital room?: A Little Help needed climbing 3-5 steps with a railing? : A Little 6 Click Score: 22    End of Session Equipment Utilized During Treatment: Gait belt;Other (comment) (Boot for lt foot) Activity Tolerance: Patient tolerated treatment well Patient left: in bed;with call bell/phone within reach;with bed alarm set Nurse Communication: Mobility status PT Visit Diagnosis: Unsteadiness on feet (R26.81);Other abnormalities of gait and mobility (R26.89);Pain Pain - Right/Left: Left Pain - part of body: Ankle and joints of foot    Time: 1440-1502 PT Time Calculation (min) (ACUTE ONLY): 22 min   Charges:     PT Treatments $Therapeutic Exercise: 8-22 mins        Lenn Cal, SPT Acute Rehab [facility] 01/16/2022, 3:26  PM

## 2022-01-16 NOTE — Progress Notes (Signed)
PROGRESS NOTE    Marissa Thompson  PJA:[id number] DOB: Jul 11, 1951 DOA: 01/10/2022 PCP: Willow Ora, MD   Brief Narrative:  Marissa Thompson is a 71 y.o. female with medical history significant of hypertension, diabetes, GERD, hyperlipidemia, hypothyroidism, depression, RLS, macular degeneration, bunionectomy 6/8 presenting with a week of foot pain redness and swelling. She was admitted for concern of postoperative infection vs. osteomyelitis with bone scan and podiatry consult.  Podiatry evaluated and reviewed the bone scans and based on her blood work doubted that she has osteomyelitis at this time and it is secondary to most likely postoperative changes.  Patient had a fall last week and podiatry recommend starting IV antibiotics for soft tissue infection for now and then discharging on oral antibiotics.  After further review will likely change her to cefadroxil plus Bactrim.  PT evaluating and recommending SNF given her multiple falls.  We feel she would benefit from skilled nursing facility for further rehabilitative efforts and she is working with physical therapy and podiatry evaluated and feels that her inflammatory markers are reassuring and recommending that she is limiting her activity icing and elevating her foot and leg.  They are recommending continue IV antibiotics for now and discharging with oral antibiotics given that her cellulitis is improving.  Patient is stable for discharge to SNF and will change to p.o. cefadroxil and Bactrim at discharge however insurance authorization and PASRR is still pending.    Assessment and Plan:  Postoperative soft tissue infection and Cellulitis, possible osteomyelitis ruled out  -Systemic inflammatory markers thus far are reassuring, bone scan done and showed "Increased blood flow, blood pool, and delayed uptake of tracer at operative sites in the LEFT foot consistent with postoperative changes. Due to the recent time of surgery, unable to exclude  or confirm osteomyelitis at the operative sites."  -Per discussion with podiatry, If it is negative then would like to give her IV antibiotics while hospitalized then send home with oral.  -If positive or need to do biopsy  but bone Scan was negative -Blood cultures x2 show NO Growth at 3 Days still  -CRP 0.6 -Lactic acid level went from 2.6 is now 2.4 -Continue pain control as ordered with Acetaminophen 650 mg po/RC and Hydromorphone 0.5 mg IV q3hprn Severe and Moderate Pain. Will add Oxycodone IR 5 mg po q6hprn for Moderate Pain for po pain Control  -Per podiatry she is weightbearing in a surgical shoe but they prefer to be in a boot -Patient continued to be afebrile had no leukocytosis -Given that she has been immobile and given her recent surgery venous duplex was ordered and showed no evidence of DVT and Dr. Loreta Ave recommended DVT prophylaxis so we have initiated Lovenox injections and resumed her aspirin -Podiatry recommends that she is weightbearing as tolerated in surgical shoe but they have discussed the importance of trying to limit weightbearing and also discussed that when she is up and walking that she needs to wear her surgical shoe.  From a podiatry parents point they are recommending oral antibiotics and consider switching to Bactrim and Cipro however we will change to Bactrim and cefadroxil likely -PT/OT ordered and recommending SNF and pending placement; Bed Offer accepted but awaiting PASRR   Hypertension -Continue home Lisinopril 2.5 mg p.o. daily -Continue to Monitor BP per Protocol -Last BP reading is 112/50   Hyperlipidemia -Continue home Pravastatin 40 mg p.o. daily   Depression Anxiety -Continue home Citalopram, 20 mg po daily and as needed Clonazepam 0.5  milligram p.o. every 8 hours as as needed   RLS -Continue home Gabapentin 1200 mg p.o. twice daily   Hypothyroidism - Continue home Levothyroxine 100 mcg p.o. nightly  Constipation -C/w Senna-Docusate 3 tab  po Daily -Patient now requesting Milk of [place] so will add 15 mL DailyPRN   Hypoalbuminemia -Patient's albumin level went from 3.1 -> 3.3 and is now 3.2 -> 3.4 -Continue monitor and trend and repeat CMP in the a.m.   Normocytic Anemia -Patient's hemoglobin/hematocrit went from 11.8/38.9 -> 10.7/34.4 -> 10.7/33.9 -> 10.4/34.6 -> 11.0/36.3 -> 10.2/33.3 -Anemia panel done and showed an iron level of 24, U IBC of 315, TIBC of 339, saturation of 7%, ferritin level 11%, follow-up 26.2 and a vitamin B12 of 78 -We will start repletion and start Niferex 150 mg p.o. daily and she will receive oh vitamin B-12 1000 mcg injection today and then start 1000 mcg p.o. tomorrow -Continue to monitor for signs and symptoms bleeding; no overt bleeding noted -Repeat CBC in the AM   IDT2DM:  -Continue with 30 units long-acting insulin and SSI -CBG's ranging from 96-199 -Continue to monitor blood sugars carefully and adjust insulin regimen as necessary   GERD/GI Prophylaxis  - Continue home PPI with Pantoprazole 40 mg p.o. daily   Obesity -Complicates overall prognosis and care -Estimated body mass index is 32.02 kg/m as calculated from the following:   Height as of this encounter: 4\' 11"  (1.499 m).   Weight as of this encounter: 71.9 kg.  -Weight Loss and Dietary Counseling given  DVT prophylaxis: enoxaparin (LOVENOX) injection 40 mg Start: 01/14/22 1600 SCDs Start: 01/10/22 2137    Code Status: Full Code Family Communication: No family present at bedside  Disposition Plan:  Level of care: Med-Surg Status is: Inpatient Remains inpatient appropriate because: Medically stable to be D/C'd but awaiting SNF, Insurance Auth, and PASRR   Consultants:  Podiatry  Procedures:  Lower extremity venous duplex on the left Summary:  RIGHT:  - No evidence of common femoral vein obstruction.     LEFT:  - There is no evidence of deep vein thrombosis in the lower extremity.     - No cystic structure  found in the popliteal fossa.   Antimicrobials:  Anti-infectives (From admission, onward)    Start     Dose/Rate Route Frequency Ordered Stop   01/12/22 2000  vancomycin (VANCOREADY) IVPB 750 mg/150 mL        750 mg 150 mL/hr over 60 Minutes Intravenous Every 24 hours 01/11/22 1844     01/11/22 1930  cefTRIAXone (ROCEPHIN) 1 g in sodium chloride 0.9 % 100 mL IVPB       Note to Pharmacy: has tolerated keflex   1 g 200 mL/hr over 30 Minutes Intravenous Every 24 hours 01/11/22 1824     01/11/22 1930  vancomycin (VANCOCIN) IVPB 1000 mg/200 mL premix        1,000 mg 200 mL/hr over 60 Minutes Intravenous  Once 01/11/22 1844 01/11/22 2024       Subjective: Seen and examined at bedside and states that she is doing okay.  Continues to still have some pain in her foot and states it was worse last night.  States it is about a 6 or 7 out of 10 and had just received pain medication.  She states that she is more constipated and states the senna docusate is not helping so she is requesting something else specifically milk of magnesia or suppository.  She denies any other  concerns or complaints at this time and likely being discharged tomorrow.  No other concerns or complaints at this time  Objective: Vitals:   01/15/22 1337 01/15/22 2109 01/16/22 0443 01/16/22 0500  BP: (!) 127/55 (!) 142/70 (!) 112/50   Pulse: 74 76 70   Resp: 20 16 16    Temp: 98.3 F (36.8 C) 98.5 F (36.9 C) 98.2 F (36.8 C)   TempSrc: Oral Oral Oral   SpO2: 95% 100% 98%   Weight:    71.9 kg  Height:        Intake/Output Summary (Last 24 hours) at 01/16/2022 1118 Last data filed at 01/16/2022 [id number] Gross per 24 hour  Intake 450 ml  Output --  Net 450 ml   Filed Weights   01/14/22 0500 01/15/22 0337 01/16/22 0500  Weight: 72.4 kg 72.3 kg 71.9 kg   Examination: Physical Exam:  Constitutional: WN/WD obese Caucasian female currently no acute distress appears calm but still complains some left foot pain Respiratory:  Slightly diminished to auscultation bilaterally with coarse breath sounds, no wheezing, rales, rhonchi or crackles. Normal respiratory effort and patient is not tachypenic. No accessory muscle use.  Unlabored breathing and is not wearing any supplemental oxygen via nasal cannula Cardiovascular: RRR, no murmurs / rubs / gallops. S1 and S2 auscultated.  Slight lower extremity edema on the left Abdomen: Soft, non-tender, distended secondary body habitus.  Bowel sounds positive.  GU: Deferred. Musculoskeletal: No clubbing / cyanosis of digits/nails. No joint deformity upper and lower extremities but skin changes in foot changes noted from her bunionectomy Skin: Left leg is not as warm or erythematous still remains slightly swollen Neurologic: CN 2-12 grossly intact with no focal deficits. Romberg sign and cerebellar reflexes not assessed.  Psychiatric: Normal judgment and insight. Alert and oriented x 3. Normal mood and appropriate affect.   Data Reviewed: I have personally reviewed following labs and imaging studies  CBC: Recent Labs  Lab 01/10/22 1633 01/11/22 0409 01/13/22 0407 01/14/22 0421 01/15/22 0343 01/16/22 0355  WBC 4.1 5.3 4.7 5.1 4.9 4.8  NEUTROABS 2.4  --  2.3 2.5 2.1 2.2  HGB 11.8* 10.7* 10.7* 10.4* 11.0* 10.2*  HCT 38.9 34.4* 33.9* 34.6* 36.3 33.3*  MCV 84.7 83.9 83.3 84.4 85.2 83.9  PLT 335 294 282 273 261 236   Basic Metabolic Panel: Recent Labs  Lab 01/11/22 0409 01/13/22 0407 01/14/22 0421 01/15/22 0343 01/16/22 0355  NA 143 144 141 139 142  K 3.8 3.8 3.7 4.1 3.9  CL 109 107 107 104 106  CO2 28 31 28 29 28   GLUCOSE 129* 108* 94 134* 118*  BUN 8 9 9 10 11   CREATININE 0.61 0.62 0.59 0.55 0.52  CALCIUM 8.9 8.8* 8.6* 8.8* 8.7*  MG  --  1.8 2.3 2.1 2.0  PHOS  --  4.0 3.8 4.2 3.5   GFR: Estimated Creatinine Clearance: 57.3 mL/min (by C-G formula based on SCr of 0.52 mg/dL). Liver Function Tests: Recent Labs  Lab 01/11/22 0409 01/13/22 0407 01/14/22 0421  01/15/22 0343 01/16/22 0355  AST 16 21 22 20 18   ALT 15 17 17 19 16   ALKPHOS 102 117 119 132* 121  BILITOT 0.2* 0.5 0.5 0.4 0.4  PROT 5.5* 5.7* 5.6* 6.5 5.8*  ALBUMIN 3.1* 3.3* 3.2* 3.6 3.4*   No results for input(s): "LIPASE", "AMYLASE" in the last 168 hours. No results for input(s): "AMMONIA" in the last 168 hours. Coagulation Profile: No results for input(s): "INR", "PROTIME" in the last  168 hours. Cardiac Enzymes: No results for input(s): "CKTOTAL", "CKMB", "CKMBINDEX", "TROPONINI" in the last 168 hours. BNP (last 3 results) No results for input(s): "PROBNP" in the last 8760 hours. HbA1C: No results for input(s): "HGBA1C" in the last 72 hours. CBG: Recent Labs  Lab 01/15/22 0759 01/15/22 1205 01/15/22 1633 01/15/22 2104 01/16/22 0736  GLUCAP 113* 132* 199* 123* 96   Lipid Profile: No results for input(s): "CHOL", "HDL", "LDLCALC", "TRIG", "CHOLHDL", "LDLDIRECT" in the last 72 hours. Thyroid Function Tests: No results for input(s): "TSH", "T4TOTAL", "FREET4", "T3FREE", "THYROIDAB" in the last 72 hours. Anemia Panel: Recent Labs    01/14/22 0421  VITAMINB12 78*  FOLATE 26.2  FERRITIN 11  TIBC 339  IRON 24*  RETICCTPCT 2.0   Sepsis Labs: Recent Labs  Lab 01/10/22 1633 01/10/22 1948  LATICACIDVEN 2.6* 2.4*    Recent Results (from the past 240 hour(s))  Blood culture (routine x 2)     Status: None   Collection Time: 01/10/22 10:16 PM   Specimen: BLOOD  Result Value Ref Range Status   Specimen Description   Final    BLOOD LEFT ANTECUBITAL Performed at [facility], [address] [place], [place] [address]    Special Requests   Final    BOTTLES DRAWN AEROBIC AND ANAEROBIC Blood Culture results may not be optimal due to an inadequate volume of blood received in culture bottles Performed at [facility], [address], [place], [place] [address]    Culture   Final    NO GROWTH 5 DAYS Performed at [facility], [address], [place], [place] [address]    Report Status 01/16/2022 FINAL  Final  Blood culture (routine x 2)     Status: None   Collection Time: 01/11/22  4:09 AM   Specimen: BLOOD  Result Value Ref Range Status   Specimen Description   Final    BLOOD LEFT ANTECUBITAL Performed at [facility], [address], [place], [place] [address]    Special Requests   Final    BOTTLES DRAWN AEROBIC ONLY Blood Culture adequate volume Performed at [facility], [address] [place], [place] [address]    Culture   Final    NO GROWTH 5 DAYS Performed at [facility], [address], [place], [place] [address]    Report Status 01/16/2022 FINAL  Final     Radiology Studies: No results found.  Scheduled Meds:  aspirin EC  81 mg Oral Daily   citalopram  20 mg Oral Daily   enoxaparin (LOVENOX) injection  40 mg Subcutaneous Q24H   gabapentin  1,200 mg Oral BID   insulin aspart  0-15 Units Subcutaneous TID WC   insulin aspart  0-5 Units Subcutaneous QHS   insulin glargine-yfgn  30 Units Subcutaneous QHS   iron polysaccharides  150 mg Oral Daily   levothyroxine  100 mcg Oral QHS   lisinopril  2.5 mg Oral Daily   multivitamin  1 tablet Oral Daily   pantoprazole  40 mg Oral Daily   pravastatin  40 mg Oral Daily   senna-docusate  3 tablet Oral Q0600   sodium chloride flush  3 mL Intravenous Q12H   vitamin B-12  1,000 mcg Oral Daily   Continuous Infusions:  cefTRIAXone (ROCEPHIN)  IV 1 g (01/15/22 1845)   vancomycin 750 mg (01/15/22 2212)    LOS: 5 days   Marguerita Merles, DO [facility] Available  via Epic secure chat 7am-7pm After these hours, please refer to coverage provider listed on [email or web address] 01/16/2022, 11:18 AM

## 2022-01-17 DIAGNOSIS — L03116 Cellulitis of left lower limb: Secondary | ICD-10-CM

## 2022-01-17 DIAGNOSIS — E1165 Type 2 diabetes mellitus with hyperglycemia: Secondary | ICD-10-CM

## 2022-01-17 DIAGNOSIS — F339 Major depressive disorder, recurrent, unspecified: Secondary | ICD-10-CM | POA: Diagnosis not present

## 2022-01-17 DIAGNOSIS — E538 Deficiency of other specified B group vitamins: Secondary | ICD-10-CM

## 2022-01-17 DIAGNOSIS — K2100 Gastro-esophageal reflux disease with esophagitis, without bleeding: Secondary | ICD-10-CM | POA: Diagnosis not present

## 2022-01-17 DIAGNOSIS — D509 Iron deficiency anemia, unspecified: Secondary | ICD-10-CM

## 2022-01-17 LAB — COMPREHENSIVE METABOLIC PANEL
ALT: 15 U/L (ref 0–44)
AST: 15 U/L (ref 15–41)
Albumin: 3.2 g/dL — ABNORMAL LOW (ref 3.5–5.0)
Alkaline Phosphatase: 112 U/L (ref 38–126)
Anion gap: 7 (ref 5–15)
BUN: 9 mg/dL (ref 8–23)
CO2: 26 mmol/L (ref 22–32)
Calcium: 8.6 mg/dL — ABNORMAL LOW (ref 8.9–10.3)
Chloride: 105 mmol/L (ref 98–111)
Creatinine, Ser: 0.36 mg/dL — ABNORMAL LOW (ref 0.44–1.00)
GFR, Estimated: >60 mL/min (ref >60)
Glucose, Bld: 132 mg/dL — ABNORMAL HIGH (ref 70–99)
Potassium: 4.1 mmol/L (ref 3.5–5.1)
Sodium: 138 mmol/L (ref 135–145)
Total Bilirubin: 0.5 mg/dL (ref 0.3–1.2)
Total Protein: 5.8 g/dL — ABNORMAL LOW (ref 6.5–8.1)

## 2022-01-17 LAB — CBC WITH DIFFERENTIAL/PLATELET
Abs Immature Granulocytes: 0.01 10*3/uL (ref 0.00–0.07)
Basophils Absolute: 0.1 10*3/uL (ref 0.0–0.1)
Basophils Relative: 1 %
Eosinophils Absolute: 0.1 10*3/uL (ref 0.0–0.5)
Eosinophils Relative: 3 %
HCT: 32.9 % — ABNORMAL LOW (ref 36.0–46.0)
Hemoglobin: 10 g/dL — ABNORMAL LOW (ref 12.0–15.0)
Immature Granulocytes: 0 %
Lymphocytes Relative: 44 %
Lymphs Abs: 2 10*3/uL (ref 0.7–4.0)
MCH: 25.9 pg — ABNORMAL LOW (ref 26.0–34.0)
MCHC: 30.4 g/dL (ref 30.0–36.0)
MCV: 85.2 fL (ref 80.0–100.0)
Monocytes Absolute: 0.6 10*3/uL (ref 0.1–1.0)
Monocytes Relative: 12 %
Neutro Abs: 1.8 10*3/uL (ref 1.7–7.7)
Neutrophils Relative %: 40 %
Platelets: 211 10*3/uL (ref 150–400)
RBC: 3.86 MIL/uL — ABNORMAL LOW (ref 3.87–5.11)
RDW: 16.6 % — ABNORMAL HIGH (ref 11.5–15.5)
WBC: 4.5 10*3/uL (ref 4.0–10.5)
nRBC: 0 % (ref 0.0–0.2)

## 2022-01-17 LAB — GLUCOSE, CAPILLARY
Glucose-Capillary: 119 mg/dL — ABNORMAL HIGH (ref 70–99)
Glucose-Capillary: 184 mg/dL — ABNORMAL HIGH (ref 70–99)
Glucose-Capillary: 225 mg/dL — ABNORMAL HIGH (ref 70–99)

## 2022-01-17 LAB — PHOSPHORUS: Phosphorus: 3.5 mg/dL (ref 2.5–4.6)

## 2022-01-17 LAB — MAGNESIUM: Magnesium: 2 mg/dL (ref 1.7–2.4)

## 2022-01-17 MED ORDER — OXYCODONE HCL 5 MG PO TABS: 5.0000 mg | ORAL_TABLET | Freq: Four times a day (QID) | ORAL | 0 refills | Status: AC | PRN

## 2022-01-17 MED ORDER — SULFAMETHOXAZOLE-TRIMETHOPRIM 800-160 MG PO TABS: 1.0000 | ORAL_TABLET | Freq: Two times a day (BID) | ORAL | 0 refills | Status: DC

## 2022-01-17 MED ORDER — POLYSACCHARIDE IRON COMPLEX 150 MG PO CAPS: 150.0000 mg | ORAL_CAPSULE | Freq: Every day | ORAL | Status: DC

## 2022-01-17 MED ORDER — CEFADROXIL 500 MG PO CAPS: 1000.0000 mg | ORAL_CAPSULE | Freq: Two times a day (BID) | ORAL | 0 refills | Status: AC

## 2022-01-17 MED ORDER — CYANOCOBALAMIN 1000 MCG PO TABS: 1000.0000 ug | ORAL_TABLET | Freq: Every day | ORAL | Status: DC

## 2022-01-17 MED ORDER — ACETAMINOPHEN 500 MG PO TABS: 1000.0000 mg | ORAL_TABLET | Freq: Three times a day (TID) | ORAL | 0 refills | Status: DC | PRN

## 2022-01-17 MED ORDER — NALOXEGOL OXALATE 25 MG PO TABS
25.0000 mg | ORAL_TABLET | Freq: Every day | ORAL | Status: DC
  Administered 2022-01-17 – 2022-01-18 (×2): 25 mg via ORAL
  Filled 2022-01-17 (×2): qty 1

## 2022-01-17 MED ORDER — OXYCODONE HCL 5 MG PO TABS
5.0000 mg | ORAL_TABLET | Freq: Four times a day (QID) | ORAL | Status: DC | PRN
  Administered 2022-01-17 – 2022-01-18 (×3): 10 mg via ORAL
  Filled 2022-01-17 (×3): qty 2

## 2022-01-17 MED ORDER — SENNOSIDES-DOCUSATE SODIUM 8.6-50 MG PO TABS: 2.0000 | ORAL_TABLET | Freq: Two times a day (BID) | ORAL | Status: DC | PRN

## 2022-01-17 MED ORDER — CEFADROXIL 500 MG PO CAPS: 1000.0000 mg | ORAL_CAPSULE | Freq: Two times a day (BID) | ORAL | 0 refills | Status: DC

## 2022-01-17 MED ORDER — CLONAZEPAM 0.5 MG PO TABS: ORAL_TABLET | ORAL | 0 refills | Status: DC

## 2022-01-17 NOTE — Discharge Summary (Signed)
Physician Discharge Summary  BREENA BEVACQUA GTX:[id number] DOB: Aug 05, 1950 DOA: 01/10/2022  PCP: Leamon Arnt, MD  Admit date: 01/10/2022 Discharge date: 01/17/2022 Admitted From: Home Disposition: SNF Recommendations for Outpatient Follow-up:  Follow up with podiatry in 10 to 14 days Please obtain CBC and CMP in 1 week Please follow up on the following pending results: None   Discharge Condition: Stable CODE STATUS: Full code  Follow-up Information     Trula Slade, DPM. Schedule an appointment as soon as possible for a visit in 10 day(s).   Specialty: Podiatry Contact information: [address] [place] [address] [phone]                 Hospital course 71 year old F with PMH of IDDM-2 with neuropathy, HTN, hypothyroidism, HLD, GERD, RLS, depression, chronic pain and recent left bunionectomy on 12/21/2021 presenting with left foot pain, swelling and redness, and admitted for postoperative soft tissue infection and cellulitis despite oral Bactrim outpatient.  She is hemodynamically stable.  No leukocytosis or fever but lactic acidosis to 2.4.  CRP and ESR within normal.  Blood cultures NGTD.  Lower extremity venous Doppler negative for DVT.  She was started on broad-spectrum antibiotics.  Podiatry was consulted and recommended IV antibiotics in-house and oral antibiotics for 10 to 14 days on discharge.  Podiatry recommended weightbearing as tolerated and outpatient follow-up in 10 to 14 days.  See individual problem list below for more.   Problems addressed during this hospitalization Principal Problem:   Cellulitis of left foot Active Problems:   Hypertension associated with diabetes ([facility])   GERD (gastroesophageal reflux disease)   Mixed hyperlipidemia   Hypothyroidism   Uncontrolled IDDM-2 with hyperglycemia and neuropathy   Major depression, recurrent, chronic (HCC)   Postoperative left foot infection/cellulitis-osteomyelitis ruled  out.  Presents with left foot pain, swelling and redness.  No fever or leukocytosis.  CRP and ESR within normal.  Blood cultures NGTD.  LE venous Doppler negative for DVT.  Patient failed outpatient treatment with Bactrim.  Treated with IV vancomycin and ceftriaxone. -Discharged on cefadroxil and Bactrim-DS for 10 more days -WBAT in surgical boot -Tylenol, gabapentin and oxycodone as needed for pain -Outpatient follow-up with podiatry in 10 to 14 days -Continue leg elevation  Uncontrolled IDDM-2 with hyperglycemia and neuropathy -Continue home medications  Essential hypertension: On low-dose lisinopril -Continue home lisinopril  Mood disorder/depression/restless leg syndrome: Stable -Continue home medications  Hypothyroidism -Continue home Synthroid  Iron and vitamin B12 deficiency anemia: Anemia panel with iron deficiency and vitamin B12 deficiency -Continue ferrous sulfate and vitamin B12  Physical deconditioning -Therapy recommended SNF  Constipation -Bowel regimen  Obesity Body mass index is 32.19 kg/m.  -Encourage lifestyle change to lose weight.       Vital signs Vitals:   01/17/22 0119 01/17/22 0500 01/17/22 0538 01/17/22 0935  BP: (!) 115/50  (!) 108/52 (!) 106/48  Pulse: 73  66 68  Temp: 98.2 F (36.8 C)  97.8 F (36.6 C) 98.1 F (36.7 C)  Resp: _0 Height:      Weight:  72.3 kg    SpO2: 94%  97% 93%  TempSrc: Oral  Oral Oral  BMI (Calculated):  32.18       Discharge exam  GENERAL: No apparent distress.  Nontoxic. HEENT: MMM.  Vision and hearing grossly intact.  NECK: Supple.  No apparent JVD.  RESP:  No IWOB.  Fair aeration bilaterally. CVS:  RRR. Heart  sounds normal.  ABD/GI/GU: BS+. Abd soft, NTND.  MSK/EXT:  Moves extremities. No apparent deformity. No edema.  SKIN: Healed scar over dorsal aspect of left foot.  Slight swelling.  No significant tenderness. NEURO: Awake and alert. Oriented appropriately.  No apparent focal neuro  deficit. PSYCH: Calm. Normal affect.   Discharge Instructions Discharge Instructions     Diet - low sodium heart healthy   Complete by: As directed    Diet Carb Modified   Complete by: As directed    Increase activity slowly   Complete by: As directed    No wound care   Complete by: As directed       Allergies as of 01/17/2022       Reactions   Toradol [ketorolac Tromethamine] Other (See Comments)   Slurred speech, confusion   Doxycycline Nausea And Vomiting   Baclofen Other (See Comments)   Memory Loss and shakes   Morphine And Related Itching   Penicillins Rash   Has patient had a PCN reaction causing immediate rash, facial/tongue/throat swelling, SOB or lightheadedness with hypotension: Yes Has patient had a PCN reaction causing severe rash involving mucus membranes or skin necrosis: No Has patient had a PCN reaction that required hospitalization: No Has patient had a PCN reaction occurring within the last 10 years: No If all of the above answers are "NO", then may proceed with Cephalosporin use.   Talwin [pentazocine] Itching        Medication List     STOP taking these medications    cyclobenzaprine 10 MG tablet Commonly known as: FLEXERIL   diphenhydrAMINE 25 MG tablet Commonly known as: BENADRYL   doxycycline 100 MG tablet Commonly known as: VIBRA-TABS   ibuprofen 200 MG tablet Commonly known as: ADVIL       TAKE these medications    acetaminophen 500 MG tablet Commonly known as: TYLENOL Take 2 tablets (1,000 mg total) by mouth every 8 (eight) hours as needed for mild pain, moderate pain or headache. What changed:  when to take this reasons to take this   aspirin EC 81 MG tablet Take 81 mg by mouth daily.   cefadroxil 500 MG capsule Commonly known as: DURICEF Take 2 capsules (1,000 mg total) by mouth 2 (two) times daily for 10 days.   citalopram 20 MG tablet Commonly known as: CELEXA TAKE 1 TABLET BY MOUTH EVERY DAY   clonazePAM 0.5 MG  tablet Commonly known as: KLONOPIN TAKE 1 TABLET BY MOUTH TWICE A DAY AS NEEDED FOR ANXIETY What changed:  how much to take how to take this when to take this additional instructions   cyanocobalamin 1000 MCG tablet Take 1 tablet (1,000 mcg total) by mouth daily. Start taking on: January 18, 2022   gabapentin 600 MG tablet Commonly known as: NEURONTIN Take 2 tablets (1,200 mg total) by mouth 3 (three) times daily. What changed: when to take this   iron polysaccharides 150 MG capsule Commonly known as: NIFEREX Take 1 capsule (150 mg total) by mouth daily. Start taking on: January 18, 2022   Jardiance 25 MG Tabs tablet Generic drug: empagliflozin TAKE 1 TABLET (25 MG TOTAL) BY MOUTH DAILY. What changed: how much to take   levothyroxine 100 MCG tablet Commonly known as: SYNTHROID Take 1 tablet (100 mcg total) by mouth daily before breakfast. What changed: when to take this   lisinopril 5 MG tablet Commonly known as: ZESTRIL Take 0.5 tablets (2.5 mg total) by mouth daily.   Melatonin 10  MG Caps Take 20 mg by mouth at bedtime.   metFORMIN 1000 MG tablet Commonly known as: GLUCOPHAGE Take 1 tablet (1,000 mg total) by mouth 2 (two) times daily with a meal.   OVER THE COUNTER MEDICATION Take 2 tablets by mouth daily. vision shield   oxyCODONE 5 MG immediate release tablet Commonly known as: Oxy IR/ROXICODONE Take 1 tablet (5 mg total) by mouth every 6 (six) hours as needed for up to 5 days for severe pain.   pantoprazole 40 MG tablet Commonly known as: PROTONIX TAKE 1 TABLET BY MOUTH EVERY DAY   pravastatin 40 MG tablet Commonly known as: PRAVACHOL TAKE 1 TABLET BY MOUTH EVERYDAY AT BEDTIME What changed: See the new instructions.   Prevagen Extra Strength 20 MG Caps Generic drug: Apoaequorin Take 20 mg by mouth daily.   senna-docusate 8.6-50 MG tablet Commonly known as: Senokot-S Take 2 tablets by mouth 2 (two) times daily as needed for mild constipation.    sulfamethoxazole-trimethoprim 800-160 MG tablet Commonly known as: BACTRIM DS Take 1 tablet by mouth 2 (two) times daily.   Toujeo SoloStar 300 UNIT/ML Solostar Pen Generic drug: insulin glargine (1 Unit Dial) INJECT 45 UNITS INTO THE SKIN AT BEDTIME. What changed: See the new instructions.   TUMS PO Take 1 tablet by mouth daily as needed (acid reflux).        Consultations: Podiatry  Procedures/Studies:   VAS [place] LOWER EXTREMITY VENOUS (DVT)  Result Date: 01/14/2022  Lower Venous DVT Study Patient Name:  CORDELL COKE  Date of Exam:   01/14/2022 Medical Rec #: [id number]         Accession #:    [id number] Date of Birth: 07/24/1950         Patient Gender: F Patient Age:   89 years Exam Location:  [facility] Procedure:      VAS [place] LOWER EXTREMITY VENOUS (DVT) Referring Phys: Celesta Gentile --------------------------------------------------------------------------------  Indications: Pain.  Risk Factors: Surgery. Limitations: Poor ultrasound/tissue interface. Comparison Study: No prior studies. Performing Technologist: Oliver Hum RVT  Examination Guidelines: A complete evaluation includes B-mode imaging, spectral Doppler, color Doppler, and power Doppler as needed of all accessible portions of each vessel. Bilateral testing is considered an integral part of a complete examination. Limited examinations for reoccurring indications may be performed as noted. The reflux portion of the exam is performed with the patient in reverse Trendelenburg.  +-----+---------------+---------+-----------+----------+--------------+ RIGHTCompressibilityPhasicitySpontaneityPropertiesThrombus Aging +-----+---------------+---------+-----------+----------+--------------+ CFV  Full           Yes      Yes                                 +-----+---------------+---------+-----------+----------+--------------+   +---------+---------------+---------+-----------+----------+--------------+ LEFT      CompressibilityPhasicitySpontaneityPropertiesThrombus Aging +---------+---------------+---------+-----------+----------+--------------+ CFV      Full           Yes      Yes                                 +---------+---------------+---------+-----------+----------+--------------+ SFJ      Full                                                        +---------+---------------+---------+-----------+----------+--------------+  FV Prox  Full                                                        +---------+---------------+---------+-----------+----------+--------------+ FV Mid   Full                                                        +---------+---------------+---------+-----------+----------+--------------+ FV DistalFull                                                        +---------+---------------+---------+-----------+----------+--------------+ PFV      Full                                                        +---------+---------------+---------+-----------+----------+--------------+ POP      Full           Yes      Yes                                 +---------+---------------+---------+-----------+----------+--------------+ PTV      Full                                                        +---------+---------------+---------+-----------+----------+--------------+ PERO     Full                                                        +---------+---------------+---------+-----------+----------+--------------+    Summary: RIGHT: - No evidence of common femoral vein obstruction.  LEFT: - There is no evidence of deep vein thrombosis in the lower extremity.  - No cystic structure found in the popliteal fossa.  *See table(s) above for measurements and observations. Electronically signed by Jamelle Haring on 01/14/2022 at 12:22:45 PM.    Final    NM Bone Scan 3 Phase Lower Extremity  Result Date: 01/11/2022 CLINICAL DATA:  LEFT foot arthrodesis  and great toe surgery, pain, treated for infection, question osteomyelitis EXAM: NUCLEAR MEDICINE 3-PHASE BONE SCAN TECHNIQUE: Radionuclide angiographic images, immediate static blood pool images, and 3-hour delayed static images were obtained of the feet/ankles after intravenous injection of radiopharmaceutical. RADIOPHARMACEUTICALS:  21.9 mCi Tc-82mMDP IV COMPARISON:  LEFT foot radiographs FINDINGS: Vascular phase: Increased blood flow to mid left foot and great toe Blood pool phase: Increased blood pool at mid LEFT foot and great toe Delayed phase: Minimal uptake of tracer at mid RIGHT foot likely degenerative. In LEFT foot, markedly increased  uptake in mid LEFT foot at sites of surgery of of the first through third metatarsals. Additional uptake at the LEFT great toe at the distal first metatarsal and proximal phalanx at sites of prior bunionectomy and osteotomy. Observed uptake is consistent with postoperative change at both the metatarsals and the great toe. Unable to confirm/exclude osteomyelitis by scintigraphy at this interval post surgery. IMPRESSION: Increased blood flow, blood pool, and delayed uptake of tracer at operative sites in the LEFT foot consistent with postoperative changes. Due to the recent time of surgery, unable to exclude or confirm osteomyelitis at the operative sites. Electronically Signed   By: Lavonia Dana M.D.   On: 01/11/2022 13:43   DG Foot Complete Left  Result Date: 01/10/2022 CLINICAL DATA:  Foot pain following surgery EXAM: LEFT FOOT - COMPLETE 3+ VIEW COMPARISON:  01/03/2022, 12/27/2021, 11/22/2021 FINDINGS: Stable screw fixation of the first proximal phalanx across fracture or osteotomy without significant interval bridging callus. Surgical plate and multiple screw fixation across the first second and third TMT joints. Multiple calcific densities or osseous fragments between the bases of the first and second metatarsals as before. Postsurgical changes at the head of the  first metatarsal. Question mild resorptive change at the lateral base of first metatarsal. Persistent soft tissue swelling without emphysema. Possible small cortical erosion at the head of the first metatarsal. IMPRESSION: 1. Postsurgical changes of the first metatarsal and first through third TMT joints with similar positioning of orthopedic hardware. 2. Stable alignment of fracture osteotomy of the first proximal phalanx without significant interval bridging callus 3. Question mild resorptive changes along the lateral base of first metatarsal and possible small erosion at the head of the first metatarsal, question secondary to infection/osteomyelitis versus posttraumatic. Electronically Signed   By: Donavan Foil M.D.   On: 01/10/2022 17:15       The results of significant diagnostics from this hospitalization (including imaging, microbiology, ancillary and laboratory) are listed below for reference.     Microbiology: Recent Results (from the past 240 hour(s))  Blood culture (routine x 2)     Status: None   Collection Time: 01/10/22 10:16 PM   Specimen: BLOOD  Result Value Ref Range Status   Specimen Description   Final    BLOOD LEFT ANTECUBITAL Performed at [facility] [address], [place], [place] [address]    Special Requests   Final    BOTTLES DRAWN AEROBIC AND ANAEROBIC Blood Culture results may not be optimal due to an inadequate volume of blood received in culture bottles Performed at [facility] [address] [place], [place] [address]    Culture   Final    NO GROWTH 5 DAYS Performed at [facility], [place] [address] [place], [place] [address]    Report Status 01/16/2022 FINAL  Final  Blood culture (routine x 2)     Status: None   Collection Time: 01/11/22  4:09 AM   Specimen: BLOOD  Result Value Ref Range Status   Specimen Description   Final    BLOOD LEFT ANTECUBITAL Performed at [facility]  [address] [place], [place] [address]    Special Requests   Final    BOTTLES DRAWN AEROBIC ONLY Blood Culture adequate volume Performed at [facility] [address], [place],  [address]    Culture   Final    NO GROWTH 5 DAYS Performed at [facility], [place] [address]  [place], [place] [address]    Report Status 01/16/2022 FINAL  Final     Labs:  CBC: Recent Labs  Lab 01/13/22 0407 01/14/22 0421 01/15/22 0343 01/16/22 0355 01/17/22 0322  WBC 4.7 5.1 4.9 4.8 4.5  NEUTROABS 2.3 2.5 2.1 2.2 1.8  HGB 10.7* 10.4* 11.0* 10.2* 10.0*  HCT 33.9* 34.6* 36.3 33.3* 32.9*  MCV 83.3 84.4 85.2 83.9 85.2  PLT 282 273 261 236 211   BMP &GFR Recent Labs  Lab 01/13/22 0407 01/14/22 0421 01/15/22 0343 01/16/22 0355 01/17/22 0322  NA 144 141 139 142 138  K 3.8 3.7 4.1 3.9 4.1  CL 107 107 104 106 105  CO2 _0 GLUCOSE 108* 94 134* 118* 132*  BUN _1 CREATININE 0.62 0.59 0.55 0.52 0.36*  CALCIUM 8.8* 8.6* 8.8* 8.7* 8.6*  MG 1.8 2.3 2.1 2.0 2.0  PHOS 4.0 3.8 4.2 3.5 3.5   Estimated Creatinine Clearance: 57.4 mL/min (A) (by C-G formula based on SCr of 0.36 mg/dL (L)). Liver & Pancreas: Recent Labs  Lab 01/13/22 0407 01/14/22 0421 01/15/22 0343 01/16/22 0355 01/17/22 0322  AST _2 ALT _3 ALKPHOS 117 119 132* 121 112  BILITOT 0.5 0.5 0.4 0.4 0.5  PROT 5.7* 5.6* 6.5 5.8* 5.8*  ALBUMIN 3.3* 3.2* 3.6 3.4* 3.2*   No results for input(s): "LIPASE", "AMYLASE" in the last 168 hours. No results for input(s): "AMMONIA" in the last 168 hours. Diabetic: No results for input(s): "HGBA1C" in the last 72 hours. Recent Labs  Lab 01/16/22 1142 01/16/22 1632 01/16/22 2215 01/17/22 0742 01/17/22 1134  GLUCAP 142* 202* 188* 119* 184*   Cardiac Enzymes: No results for input(s): "CKTOTAL", "CKMB", "CKMBINDEX", "TROPONINI" in the last 168 hours. No results for input(s): "PROBNP" in the last 8760  hours. Coagulation Profile: No results for input(s): "INR", "PROTIME" in the last 168 hours. Thyroid Function Tests: No results for input(s): "TSH", "T4TOTAL", "FREET4", "T3FREE", "THYROIDAB" in the last 72 hours. Lipid Profile: No results for input(s): "CHOL", "HDL", "LDLCALC", "TRIG", "CHOLHDL", "LDLDIRECT" in the last 72 hours. Anemia Panel: No results for input(s): "VITAMINB12", "FOLATE", "FERRITIN", "TIBC", "IRON", "RETICCTPCT" in the last 72 hours. Urine analysis:    Component Value Date/Time   COLORURINE YELLOW 07/13/2020 1408   APPEARANCEUR [place] Cloud[place] (A) 07/13/2020 1408   LABSPEC <=1.005 (A) 07/13/2020 1408   PHURINE 6.0 07/13/2020 1408   GLUCOSEU >=1000 (A) 07/13/2020 1408   HGBUR MODERATE (A) 07/13/2020 1408   BILIRUBINUR NEGATIVE 07/13/2020 1408   BILIRUBINUR positive 01/04/2020 [place] 07/13/2020 1408   PROTEINUR Positive (A) 01/04/2020 1121   PROTEINUR NEGATIVE 03/21/2017 1129   UROBILINOGEN 0.2 07/13/2020 1408   NITRITE NEGATIVE 07/13/2020 1408   LEUKOCYTESUR SMALL (A) 07/13/2020 1408   Sepsis Labs: Invalid input(s): "PROCALCITONIN", "LACTICIDVEN"   SIGNED:  Mercy Riding, MD  [facility] 01/17/2022, 12:34 PM

## 2022-01-17 NOTE — Plan of Care (Signed)

## 2022-01-18 DIAGNOSIS — G2581 Restless legs syndrome: Secondary | ICD-10-CM | POA: Diagnosis not present

## 2022-01-18 DIAGNOSIS — Z794 Long term (current) use of insulin: Secondary | ICD-10-CM | POA: Diagnosis not present

## 2022-01-18 DIAGNOSIS — D509 Iron deficiency anemia, unspecified: Secondary | ICD-10-CM | POA: Diagnosis not present

## 2022-01-18 DIAGNOSIS — E1140 Type 2 diabetes mellitus with diabetic neuropathy, unspecified: Secondary | ICD-10-CM | POA: Diagnosis not present

## 2022-01-18 DIAGNOSIS — E7849 Other hyperlipidemia: Secondary | ICD-10-CM | POA: Diagnosis not present

## 2022-01-18 DIAGNOSIS — E1165 Type 2 diabetes mellitus with hyperglycemia: Secondary | ICD-10-CM | POA: Diagnosis not present

## 2022-01-18 DIAGNOSIS — I152 Hypertension secondary to endocrine disorders: Secondary | ICD-10-CM | POA: Diagnosis not present

## 2022-01-18 DIAGNOSIS — E1159 Type 2 diabetes mellitus with other circulatory complications: Secondary | ICD-10-CM | POA: Diagnosis not present

## 2022-01-18 DIAGNOSIS — G894 Chronic pain syndrome: Secondary | ICD-10-CM | POA: Diagnosis not present

## 2022-01-18 DIAGNOSIS — K2100 Gastro-esophageal reflux disease with esophagitis, without bleeding: Secondary | ICD-10-CM | POA: Diagnosis not present

## 2022-01-18 DIAGNOSIS — Z7401 Bed confinement status: Secondary | ICD-10-CM | POA: Diagnosis not present

## 2022-01-18 DIAGNOSIS — Z4889 Encounter for other specified surgical aftercare: Secondary | ICD-10-CM | POA: Diagnosis not present

## 2022-01-18 DIAGNOSIS — F419 Anxiety disorder, unspecified: Secondary | ICD-10-CM | POA: Diagnosis not present

## 2022-01-18 DIAGNOSIS — M6281 Muscle weakness (generalized): Secondary | ICD-10-CM | POA: Diagnosis not present

## 2022-01-18 DIAGNOSIS — L03116 Cellulitis of left lower limb: Secondary | ICD-10-CM | POA: Diagnosis not present

## 2022-01-18 DIAGNOSIS — Z743 Need for continuous supervision: Secondary | ICD-10-CM | POA: Diagnosis not present

## 2022-01-18 DIAGNOSIS — R531 Weakness: Secondary | ICD-10-CM | POA: Diagnosis not present

## 2022-01-18 DIAGNOSIS — F39 Unspecified mood [affective] disorder: Secondary | ICD-10-CM | POA: Diagnosis not present

## 2022-01-18 DIAGNOSIS — E782 Mixed hyperlipidemia: Secondary | ICD-10-CM | POA: Diagnosis not present

## 2022-01-18 DIAGNOSIS — R41841 Cognitive communication deficit: Secondary | ICD-10-CM | POA: Diagnosis not present

## 2022-01-18 DIAGNOSIS — E039 Hypothyroidism, unspecified: Secondary | ICD-10-CM | POA: Diagnosis not present

## 2022-01-18 DIAGNOSIS — F339 Major depressive disorder, recurrent, unspecified: Secondary | ICD-10-CM | POA: Diagnosis not present

## 2022-01-18 DIAGNOSIS — R69 Illness, unspecified: Secondary | ICD-10-CM | POA: Diagnosis not present

## 2022-01-18 DIAGNOSIS — E538 Deficiency of other specified B group vitamins: Secondary | ICD-10-CM | POA: Diagnosis not present

## 2022-01-18 DIAGNOSIS — I959 Hypotension, unspecified: Secondary | ICD-10-CM | POA: Diagnosis not present

## 2022-01-18 LAB — GLUCOSE, CAPILLARY
Glucose-Capillary: 170 mg/dL — ABNORMAL HIGH (ref 70–99)
Glucose-Capillary: 131 mg/dL — ABNORMAL HIGH (ref 70–99)

## 2022-01-18 NOTE — Plan of Care (Signed)
  Problem: Education: Goal: Knowledge of General Education information will improve Description: Including pain rating scale, medication(s)/side effects and non-pharmacologic comfort measures Outcome: Adequate for Discharge   Problem: Health Behavior/Discharge Planning: Goal: Ability to manage health-related needs will improve Outcome: Adequate for Discharge   Problem: Clinical Measurements: Goal: Ability to maintain clinical measurements within normal limits will improve Outcome: Adequate for Discharge Goal: Will remain free from infection Outcome: Adequate for Discharge Goal: Diagnostic test results will improve Outcome: Adequate for Discharge Goal: Respiratory complications will improve Outcome: Adequate for Discharge Goal: Cardiovascular complication will be avoided Outcome: Adequate for Discharge   Problem: Activity: Goal: Risk for activity intolerance will decrease Outcome: Adequate for Discharge   Problem: Nutrition: Goal: Adequate nutrition will be maintained Outcome: Adequate for Discharge   Problem: Coping: Goal: Level of anxiety will decrease Outcome: Adequate for Discharge   Problem: Elimination: Goal: Will not experience complications related to bowel motility Outcome: Adequate for Discharge Goal: Will not experience complications related to urinary retention Outcome: Adequate for Discharge   Problem: Pain Managment: Goal: General experience of comfort will improve Outcome: Adequate for Discharge   Problem: Safety: Goal: Ability to remain free from injury will improve Outcome: Adequate for Discharge   Problem: Skin Integrity: Goal: Risk for impaired skin integrity will decrease Outcome: Adequate for Discharge   Problem: Education: Goal: Ability to describe self-care measures that may prevent or decrease complications (Diabetes Survival Skills Education) will improve Outcome: Adequate for Discharge Goal: Individualized Educational Video(s) Outcome:  Adequate for Discharge   Problem: Coping: Goal: Ability to adjust to condition or change in health will improve Outcome: Adequate for Discharge   Problem: Fluid Volume: Goal: Ability to maintain a balanced intake and output will improve Outcome: Adequate for Discharge   Problem: Health Behavior/Discharge Planning: Goal: Ability to identify and utilize available resources and services will improve Outcome: Adequate for Discharge Goal: Ability to manage health-related needs will improve Outcome: Adequate for Discharge   Problem: Metabolic: Goal: Ability to maintain appropriate glucose levels will improve Outcome: Adequate for Discharge   Problem: Nutritional: Goal: Maintenance of adequate nutrition will improve Outcome: Adequate for Discharge Goal: Progress toward achieving an optimal weight will improve Outcome: Adequate for Discharge   Problem: Skin Integrity: Goal: Risk for impaired skin integrity will decrease Outcome: Adequate for Discharge   Problem: Tissue Perfusion: Goal: Adequacy of tissue perfusion will improve Outcome: Adequate for Discharge   Problem: Acute Rehab PT Goals(only PT should resolve) Goal: Patient Will Transfer Sit To/From Stand Outcome: Adequate for Discharge Goal: Pt Will Ambulate Outcome: Adequate for Discharge Goal: Pt/caregiver will Perform Home Exercise Program Outcome: Adequate for Discharge

## 2022-01-18 NOTE — Discharge Summary (Signed)
Physician Discharge Summary  Marissa Thompson BWI:[id number] DOB: 12/22/1950 DOA: 01/10/2022  PCP: Leamon Arnt, MD  Admit date: 01/10/2022 Discharge date: 01/18/2022 Admitted From: Home Disposition: SNF Recommendations for Outpatient Follow-up:  Follow up with podiatry in 10 to 14 days Please obtain CBC and CMP in 1 week Please follow up on the following pending results: None   Discharge Condition: Stable CODE STATUS: Full code  Follow-up Information     Trula Slade, DPM. Schedule an appointment as soon as possible for a visit in 10 day(s).   Specialty: Podiatry Contact information: [address] [place] [address] [phone]                 Hospital course 71 year old F with PMH of IDDM-2 with neuropathy, HTN, hypothyroidism, HLD, GERD, RLS, depression, chronic pain and recent left bunionectomy on 12/21/2021 presenting with left foot pain, swelling and redness, and admitted for postoperative soft tissue infection and cellulitis despite oral Bactrim outpatient.  She is hemodynamically stable.  No leukocytosis or fever but lactic acidosis to 2.4.  CRP and ESR within normal.  Blood cultures NGTD.  Lower extremity venous Doppler negative for DVT.  She was started on broad-spectrum antibiotics.  Podiatry was consulted and recommended IV antibiotics in-house and oral antibiotics for 10 to 14 days on discharge.  Podiatry recommended weightbearing as tolerated in surgical shoe and outpatient follow-up in 10 to 14 days.  See individual problem list below for more.   Problems addressed during this hospitalization Principal Problem:   Cellulitis of left foot Active Problems:   Hypertension associated with diabetes ([facility])   GERD (gastroesophageal reflux disease)   Mixed hyperlipidemia   Hypothyroidism   Uncontrolled IDDM-2 with hyperglycemia and neuropathy   Major depression, recurrent, chronic (HCC)   Postoperative left foot  infection/cellulitis-osteomyelitis ruled out.  Presents with left foot pain, swelling and redness.  No fever or leukocytosis.  CRP and ESR within normal.  Blood cultures NGTD.  LE venous Doppler negative for DVT.  Patient failed outpatient treatment with Bactrim.  Treated with IV vancomycin and ceftriaxone. -Discharged on cefadroxil and Bactrim-DS for 10 more days -WBAT in surgical boot -Tylenol, gabapentin and oxycodone as needed for pain -Outpatient follow-up with podiatry in 10 to 14 days -Continue leg elevation  Uncontrolled IDDM-2 with hyperglycemia and neuropathy -Continue home medications  Essential hypertension: On low-dose lisinopril -Continue home lisinopril  Mood disorder/depression/restless leg syndrome: Stable -Continue home medications  Hypothyroidism -Continue home Synthroid  Iron and vitamin B12 deficiency anemia: Anemia panel with iron deficiency and vitamin B12 deficiency -Continue ferrous sulfate and vitamin B12  Physical deconditioning -Therapy recommended SNF  Constipation -Bowel regimen  Obesity Body mass index is 31.75 kg/m.  -Encourage lifestyle change to lose weight.       Vital signs Vitals:   01/17/22 1438 01/17/22 2215 01/18/22 0500 01/18/22 0608  BP: (!) 102/46 (!) 121/54  (!) 132/53  Pulse: 72 65  63  Temp: 98.2 F (36.8 C) 98.3 F (36.8 C)  98.1 F (36.7 C)  Resp: 18 18  18   Height:      Weight:   71.3 kg   SpO2: 98% 95%  96%  TempSrc: Oral Oral  Oral  BMI (Calculated):   31.73      Discharge exam  GENERAL: No apparent distress.  Nontoxic. HEENT: MMM.  Vision and hearing grossly intact.  NECK: Supple.  No apparent JVD.  RESP:  No IWOB.  Fair aeration bilaterally. CVS:  RRR. Heart sounds normal.  ABD/GI/GU: BS+. Abd soft, NTND.  MSK/EXT:  Moves extremities. No apparent deformity. No edema.  SKIN: Healed scar over dorsal aspect of left foot.  Slight swelling.  No significant tenderness. NEURO: Awake and alert. Oriented  appropriately.  No apparent focal neuro deficit. PSYCH: Calm. Normal affect.   Discharge Instructions Discharge Instructions     Diet - low sodium heart healthy   Complete by: As directed    Diet Carb Modified   Complete by: As directed    Increase activity slowly   Complete by: As directed    No wound care   Complete by: As directed       Allergies as of 01/18/2022       Reactions   Toradol [ketorolac Tromethamine] Other (See Comments)   Slurred speech, confusion   Doxycycline Nausea And Vomiting   Baclofen Other (See Comments)   Memory Loss and shakes   Morphine And Related Itching   Penicillins Rash   Has patient had a PCN reaction causing immediate rash, facial/tongue/throat swelling, SOB or lightheadedness with hypotension: Yes Has patient had a PCN reaction causing severe rash involving mucus membranes or skin necrosis: No Has patient had a PCN reaction that required hospitalization: No Has patient had a PCN reaction occurring within the last 10 years: No If all of the above answers are "NO", then may proceed with Cephalosporin use.   Talwin [pentazocine] Itching        Medication List     STOP taking these medications    cyclobenzaprine 10 MG tablet Commonly known as: FLEXERIL   diphenhydrAMINE 25 MG tablet Commonly known as: BENADRYL   doxycycline 100 MG tablet Commonly known as: VIBRA-TABS   ibuprofen 200 MG tablet Commonly known as: ADVIL       TAKE these medications    acetaminophen 500 MG tablet Commonly known as: TYLENOL Take 2 tablets (1,000 mg total) by mouth every 8 (eight) hours as needed for mild pain, moderate pain or headache. What changed:  when to take this reasons to take this   aspirin EC 81 MG tablet Take 81 mg by mouth daily.   cefadroxil 500 MG capsule Commonly known as: DURICEF Take 2 capsules (1,000 mg total) by mouth 2 (two) times daily for 10 days.   citalopram 20 MG tablet Commonly known as: CELEXA TAKE 1 TABLET  BY MOUTH EVERY DAY   clonazePAM 0.5 MG tablet Commonly known as: KLONOPIN TAKE 1 TABLET BY MOUTH TWICE A DAY AS NEEDED FOR ANXIETY What changed:  how much to take how to take this when to take this additional instructions   cyanocobalamin 1000 MCG tablet Take 1 tablet (1,000 mcg total) by mouth daily.   gabapentin 600 MG tablet Commonly known as: NEURONTIN Take 2 tablets (1,200 mg total) by mouth 3 (three) times daily. What changed: when to take this   iron polysaccharides 150 MG capsule Commonly known as: NIFEREX Take 1 capsule (150 mg total) by mouth daily.   Jardiance 25 MG Tabs tablet Generic drug: empagliflozin TAKE 1 TABLET (25 MG TOTAL) BY MOUTH DAILY. What changed: how much to take   levothyroxine 100 MCG tablet Commonly known as: SYNTHROID Take 1 tablet (100 mcg total) by mouth daily before breakfast. What changed: when to take this   lisinopril 5 MG tablet Commonly known as: ZESTRIL Take 0.5 tablets (2.5 mg total) by mouth daily.   Melatonin 10 MG Caps Take 20 mg by mouth at bedtime.  metFORMIN 1000 MG tablet Commonly known as: GLUCOPHAGE Take 1 tablet (1,000 mg total) by mouth 2 (two) times daily with a meal.   OVER THE COUNTER MEDICATION Take 2 tablets by mouth daily. vision shield   oxyCODONE 5 MG immediate release tablet Commonly known as: Oxy IR/ROXICODONE Take 1 tablet (5 mg total) by mouth every 6 (six) hours as needed for up to 5 days for severe pain.   pantoprazole 40 MG tablet Commonly known as: PROTONIX TAKE 1 TABLET BY MOUTH EVERY DAY   pravastatin 40 MG tablet Commonly known as: PRAVACHOL TAKE 1 TABLET BY MOUTH EVERYDAY AT BEDTIME What changed: See the new instructions.   Prevagen Extra Strength 20 MG Caps Generic drug: Apoaequorin Take 20 mg by mouth daily.   senna-docusate 8.6-50 MG tablet Commonly known as: Senokot-S Take 2 tablets by mouth 2 (two) times daily as needed for mild constipation.    sulfamethoxazole-trimethoprim 800-160 MG tablet Commonly known as: BACTRIM DS Take 1 tablet by mouth 2 (two) times daily.   Toujeo SoloStar 300 UNIT/ML Solostar Pen Generic drug: insulin glargine (1 Unit Dial) INJECT 45 UNITS INTO THE SKIN AT BEDTIME. What changed: See the new instructions.   TUMS PO Take 1 tablet by mouth daily as needed (acid reflux).         Consultations: Podiatry  Procedures/Studies:   VAS [place] LOWER EXTREMITY VENOUS (DVT)  Result Date: 01/14/2022  Lower Venous DVT Study Patient Name:  Marissa Thompson  Date of Exam:   01/14/2022 Medical Rec #: [id number]         Accession #:    [id number] Date of Birth: [birth year]         Patient Gender: F Patient Age:   18 years Exam Location:  [facility] Procedure:      VAS [place] LOWER EXTREMITY VENOUS (DVT) Referring Phys: Celesta Gentile --------------------------------------------------------------------------------  Indications: Pain.  Risk Factors: Surgery. Limitations: Poor ultrasound/tissue interface. Comparison Study: No prior studies. Performing Technologist: Oliver Hum RVT  Examination Guidelines: A complete evaluation includes B-mode imaging, spectral Doppler, color Doppler, and power Doppler as needed of all accessible portions of each vessel. Bilateral testing is considered an integral part of a complete examination. Limited examinations for reoccurring indications may be performed as noted. The reflux portion of the exam is performed with the patient in reverse Trendelenburg.  +-----+---------------+---------+-----------+----------+--------------+ RIGHTCompressibilityPhasicitySpontaneityPropertiesThrombus Aging +-----+---------------+---------+-----------+----------+--------------+ CFV  Full           Yes      Yes                                 +-----+---------------+---------+-----------+----------+--------------+   +---------+---------------+---------+-----------+----------+--------------+ LEFT      CompressibilityPhasicitySpontaneityPropertiesThrombus Aging +---------+---------------+---------+-----------+----------+--------------+ CFV      Full           Yes      Yes                                 +---------+---------------+---------+-----------+----------+--------------+ SFJ      Full                                                        +---------+---------------+---------+-----------+----------+--------------+ FV Prox  Full                                                        +---------+---------------+---------+-----------+----------+--------------+  FV Mid   Full                                                        +---------+---------------+---------+-----------+----------+--------------+ FV DistalFull                                                        +---------+---------------+---------+-----------+----------+--------------+ PFV      Full                                                        +---------+---------------+---------+-----------+----------+--------------+ POP      Full           Yes      Yes                                 +---------+---------------+---------+-----------+----------+--------------+ PTV      Full                                                        +---------+---------------+---------+-----------+----------+--------------+ PERO     Full                                                        +---------+---------------+---------+-----------+----------+--------------+    Summary: RIGHT: - No evidence of common femoral vein obstruction.  LEFT: - There is no evidence of deep vein thrombosis in the lower extremity.  - No cystic structure found in the popliteal fossa.  *See table(s) above for measurements and observations. Electronically signed by Jamelle Haring on 01/14/2022 at 12:22:45 PM.    Final    NM Bone Scan 3 Phase Lower Extremity  Result Date: 01/11/2022 CLINICAL DATA:  LEFT foot  arthrodesis and great toe surgery, pain, treated for infection, question osteomyelitis EXAM: NUCLEAR MEDICINE 3-PHASE BONE SCAN TECHNIQUE: Radionuclide angiographic images, immediate static blood pool images, and 3-hour delayed static images were obtained of the feet/ankles after intravenous injection of radiopharmaceutical. RADIOPHARMACEUTICALS:  21.9 mCi Tc-42mMDP IV COMPARISON:  LEFT foot radiographs FINDINGS: Vascular phase: Increased blood flow to mid left foot and great toe Blood pool phase: Increased blood pool at mid LEFT foot and great toe Delayed phase: Minimal uptake of tracer at mid RIGHT foot likely degenerative. In LEFT foot, markedly increased uptake in mid LEFT foot at sites of surgery of of the first through third metatarsals. Additional uptake at the LEFT great toe at the distal first metatarsal and proximal phalanx at sites of prior bunionectomy and osteotomy. Observed uptake is consistent with postoperative change at both the metatarsals and the great toe. Unable to confirm/exclude osteomyelitis by scintigraphy at  this interval post surgery. IMPRESSION: Increased blood flow, blood pool, and delayed uptake of tracer at operative sites in the LEFT foot consistent with postoperative changes. Due to the recent time of surgery, unable to exclude or confirm osteomyelitis at the operative sites. Electronically Signed   By: Lavonia Dana M.D.   On: 01/11/2022 13:43   DG Foot Complete Left  Result Date: 01/10/2022 CLINICAL DATA:  Foot pain following surgery EXAM: LEFT FOOT - COMPLETE 3+ VIEW COMPARISON:  01/03/2022, 12/27/2021, 11/22/2021 FINDINGS: Stable screw fixation of the first proximal phalanx across fracture or osteotomy without significant interval bridging callus. Surgical plate and multiple screw fixation across the first second and third TMT joints. Multiple calcific densities or osseous fragments between the bases of the first and second metatarsals as before. Postsurgical changes at the  head of the first metatarsal. Question mild resorptive change at the lateral base of first metatarsal. Persistent soft tissue swelling without emphysema. Possible small cortical erosion at the head of the first metatarsal. IMPRESSION: 1. Postsurgical changes of the first metatarsal and first through third TMT joints with similar positioning of orthopedic hardware. 2. Stable alignment of fracture osteotomy of the first proximal phalanx without significant interval bridging callus 3. Question mild resorptive changes along the lateral base of first metatarsal and possible small erosion at the head of the first metatarsal, question secondary to infection/osteomyelitis versus posttraumatic. Electronically Signed   By: Donavan Foil M.D.   On: 01/10/2022 17:15       The results of significant diagnostics from this hospitalization (including imaging, microbiology, ancillary and laboratory) are listed below for reference.     Microbiology: Recent Results (from the past 240 hour(s))  Blood culture (routine x 2)     Status: None   Collection Time: 01/10/22 10:16 PM   Specimen: BLOOD  Result Value Ref Range Status   Specimen Description   Final    BLOOD LEFT ANTECUBITAL Performed at [facility] [address] [place], [place] [address]    Special Requests   Final    BOTTLES DRAWN AEROBIC AND ANAEROBIC Blood Culture results may not be optimal due to an inadequate volume of blood received in culture bottles Performed at [facility] [address] [place], [place] [address]    Culture   Final    NO GROWTH 5 DAYS Performed at [facility], [place] [address], [place], [place] [address]    Report Status 01/16/2022 FINAL  Final  Blood culture (routine x 2)     Status: None   Collection Time: 01/11/22  4:09 AM   Specimen: BLOOD  Result Value Ref Range Status   Specimen Description   Final    BLOOD LEFT ANTECUBITAL Performed at [facility] [address], [place], [place] [address]    Special Requests   Final    BOTTLES DRAWN AEROBIC ONLY Blood Culture adequate volume Performed at [facility] [address] [place], [place] [address]    Culture   Final    NO GROWTH 5 DAYS Performed at [facility], [place] [address] [place], [place] [address]    Report Status 01/16/2022 FINAL  Final     Labs:  CBC: Recent Labs  Lab 01/13/22 0407 01/14/22 0421 01/15/22 0343 01/16/22 0355 01/17/22 0322  WBC 4.7 5.1 4.9 4.8 4.5  NEUTROABS 2.3 2.5 2.1 2.2 1.8  HGB 10.7* 10.4* 11.0* 10.2* 10.0*  HCT 33.9* 34.6* 36.3 33.3*  32.9*  MCV 83.3 84.4 85.2 83.9 85.2  PLT 282 273 261 236 211   BMP &GFR Recent Labs  Lab 01/13/22 0407 01/14/22 0421 01/15/22 0343 01/16/22 0355 01/17/22 0322  NA 144 141 139 142 138  K 3.8 3.7 4.1 3.9 4.1  CL 107 107 104 106 105  CO2 31 28 29 28 26   GLUCOSE 108* 94 134* 118* 132*  BUN 9 9 10 11 9   CREATININE 0.62 0.59 0.55 0.52 0.36*  CALCIUM 8.8* 8.6* 8.8* 8.7* 8.6*  MG 1.8 2.3 2.1 2.0 2.0  PHOS 4.0 3.8 4.2 3.5 3.5   Estimated Creatinine Clearance: 57 mL/min (A) (by C-G formula based on SCr of 0.36 mg/dL (L)). Liver & Pancreas: Recent Labs  Lab 01/13/22 0407 01/14/22 0421 01/15/22 0343 01/16/22 0355 01/17/22 0322  AST 21 22 20 18 15   ALT 17 17 19 16 15   ALKPHOS 117 119 132* 121 112  BILITOT 0.5 0.5 0.4 0.4 0.5  PROT 5.7* 5.6* 6.5 5.8* 5.8*  ALBUMIN 3.3* 3.2* 3.6 3.4* 3.2*   No results for input(s): "LIPASE", "AMYLASE" in the last 168 hours. No results for input(s): "AMMONIA" in the last 168 hours. Diabetic: No results for input(s): "HGBA1C" in the last 72 hours. Recent Labs  Lab 01/16/22 2215 01/17/22 0742 01/17/22 1134 01/17/22 2106 01/18/22 0724  GLUCAP 188* 119* 184* 225* 170*   Cardiac Enzymes: No results for input(s): "CKTOTAL", "CKMB", "CKMBINDEX", "TROPONINI" in the last 168 hours. No results for input(s): "PROBNP" in the last 8760  hours. Coagulation Profile: No results for input(s): "INR", "PROTIME" in the last 168 hours. Thyroid Function Tests: No results for input(s): "TSH", "T4TOTAL", "FREET4", "T3FREE", "THYROIDAB" in the last 72 hours. Lipid Profile: No results for input(s): "CHOL", "HDL", "LDLCALC", "TRIG", "CHOLHDL", "LDLDIRECT" in the last 72 hours. Anemia Panel: No results for input(s): "VITAMINB12", "FOLATE", "FERRITIN", "TIBC", "IRON", "RETICCTPCT" in the last 72 hours. Urine analysis:    Component Value Date/Time   COLORURINE YELLOW 07/13/2020 1408   APPEARANCEUR Sl Cloudy (A) 07/13/2020 1408   LABSPEC <=1.005 (A) 07/13/2020 1408   PHURINE 6.0 07/13/2020 1408   GLUCOSEU >=1000 (A) 07/13/2020 1408   HGBUR MODERATE (A) 07/13/2020 1408   BILIRUBINUR NEGATIVE 07/13/2020 1408   BILIRUBINUR positive 01/04/2020 [place] 07/13/2020 1408   PROTEINUR Positive (A) 01/04/2020 1121   PROTEINUR NEGATIVE 03/21/2017 1129   UROBILINOGEN 0.2 07/13/2020 1408   NITRITE NEGATIVE 07/13/2020 1408   LEUKOCYTESUR SMALL (A) 07/13/2020 1408   Sepsis Labs: Invalid input(s): "PROCALCITONIN", "LACTICIDVEN"   SIGNED:  Mercy Riding, MD  [facility] 01/18/2022, 10:09 AM

## 2022-01-18 NOTE — TOC Transition Note (Signed)
Transition of Care [facility]) - CM/SW Discharge Note   Patient Details  Name: Marissa Thompson MRN: [id number] Date of Birth: 08-30-1950  [facility] CM/SW Contact:  Amada Jupiter, LCSW Phone Number: 01/18/2022, 11:05 AM   Clinical Narrative:    Have received insurance authorization for SNF and PASRR clearance.  Pt medically cleared for dc today to [facility] and pt is aware and agreeable.  PTAR called at 11am.  RN to call report to [phone].  No further TOC needs.   Final next level of care: Skilled Nursing Facility Barriers to Discharge: Barriers Resolved   Patient Goals and CMS Choice Patient states their goals for this hospitalization and ongoing recovery are:: to go home CMS Medicare.gov Compare Post Acute Care list provided to:: Patient    Discharge Placement PASRR number recieved: 01/18/22            Patient chooses bed at: Other - please specify in the comment section below: [facility]) Patient to be transferred to facility by: PTAR Name of family member notified: pt is notifiy step-daughter, Helmut Muster Patient and family notified of of transfer: 01/18/22  Discharge Plan and Services   Discharge Planning Services: CM Consult            DME Arranged: N/A DME Agency: NA                  Social Determinants of Health (SDOH) Interventions     Readmission Risk Interventions    01/18/2022   11:03 AM  Readmission Risk Prevention Plan  Post Dischage Appt Complete  Medication Screening Complete  Transportation Screening Complete

## 2022-01-18 NOTE — [place] FL2 (Signed)
[place] MEDICAID FL2 LEVEL OF CARE SCREENING TOOL     IDENTIFICATION  Patient Name: Marissa Thompson Birthdate: [birth year] Sex: female Admission Date (Current Location): 01/10/2022  [facility] and [place] Number:  Producer, television/film/video and Address:  [facility],  [address] [place], [place] [address]      Provider Number: [id number]  Attending Physician Name and Address:  Almon Hercules, MD  Relative Name and Phone Number:  Winifred Olive Relative [phone], [place],Barbara Sister [phone]    Current Level of Care: Hospital Recommended Level of Care: Skilled Nursing Facility Prior Approval Number:    Date Approved/Denied:   PASRR Number: [id number]  Discharge Plan: SNF    Current Diagnoses: Patient Active Problem List   Diagnosis Date Noted   Cellulitis of left foot 01/10/2022   Restless leg syndrome 08/08/2018   Macular degeneration 07/31/2018   Seborrheic dermatitis of scalp 05/13/2018   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   History of alcoholism (HCC) 04/28/2018   S/P cervical discectomy 04/28/2018   Eczema of both external ears 12/19/2017   Major depression, recurrent, chronic (HCC) 08/29/2017   Carpal tunnel syndrome on right 08/29/2016   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/2016   Dupuytren's contracture of left hand 12/21/2015   Uncontrolled IDDM-2 with hyperglycemia and neuropathy 03/09/2015   Memory loss 03/08/2015   Dysphagia, pharyngoesophageal phase 12/29/2014   Vitamin D deficiency 03/03/2014   Allergy    Hypertension associated with diabetes (HCC)    GERD (gastroesophageal reflux disease)    Mixed hyperlipidemia    Hypothyroidism     Orientation RESPIRATION BLADDER Height & Weight     Self, Time, Place, Situation  Normal Continent Weight: 157 lb 3 oz (71.3 kg) Height:  4\' 11"  (149.9 cm)  BEHAVIORAL SYMPTOMS/MOOD NEUROLOGICAL BOWEL NUTRITION STATUS      Continent Diet  AMBULATORY STATUS COMMUNICATION OF NEEDS Skin   Limited Assist Verbally  Normal                       Personal Care Assistance Level of Assistance  Bathing, Feeding, Dressing Bathing Assistance: Limited assistance Feeding assistance: Independent Dressing Assistance: Independent     Functional Limitations Info  Sight, Hearing, Speech Sight Info: Impaired ) Hearing Info: Adequate Speech Info: Adequate    SPECIAL CARE FACTORS FREQUENCY  PT (By licensed PT), OT (By licensed OT)     PT Frequency: x5 week OT Frequency: x5 week            Contractures Contractures Info: Present (Dupuytren's contracture of left hand)    Additional Factors Info  Code Status, Allergies, Psychotropic Code Status Info: Heart Healthy and Carb Modified Allergies Info: Toradol (Ketorolac Tromethamine), Doxycycline, Baclofen, Morphine And Related, Penicillins, Talwin (Pentazocine) Psychotropic Info: Celexa 20mg  PO Daily         Current Medications (01/18/2022):  This is the current hospital active medication list Current Facility-Administered Medications  Medication Dose Route Frequency Provider Last Rate Last Admin   acetaminophen (TYLENOL) tablet 650 mg  650 mg Oral Q6H PRN , MD   650 mg at 01/13/22 2126   Or   acetaminophen (TYLENOL) suppository 650 mg  650 mg Rectal Q6H PRN 03/16/22, MD       aspirin EC tablet 81 mg  81 mg Oral Daily 2[address] [place], DO   81 mg at 01/17/22 Asprogia   bisacodyl (DULCOLAX) suppository 10 mg  10 mg Rectal Daily PRN 03/20/22 Latif, DO   10  mg at 01/16/22 1623   cefTRIAXone (ROCEPHIN) 1 g in sodium chloride 0.9 % 100 mL IVPB  1 g Intravenous Q24H Hazeline Junker B, MD 200 mL/hr at 01/17/22 2001 1 g at 01/17/22 2001   citalopram (CELEXA) tablet 20 mg  20 mg Oral Daily Synetta Fail, MD   20 mg at 01/17/22 0834   clonazePAM (KLONOPIN) tablet 0.5 mg  0.5 mg Oral QHS PRN Synetta Fail, MD   0.5 mg at 01/17/22 2104   enoxaparin (LOVENOX) injection 40 mg  40 mg Subcutaneous Q24H Sheikh,  Omair Clayton, DO   40 mg at 01/17/22 1703   gabapentin (NEURONTIN) capsule 1,200 mg  1,200 mg Oral BID Synetta Fail, MD   1,200 mg at 01/17/22 2101   insulin aspart (novoLOG) injection 0-15 Units  0-15 Units Subcutaneous TID WC Synetta Fail, MD   3 Units at 01/17/22 1236   insulin aspart (novoLOG) injection 0-5 Units  0-5 Units Subcutaneous QHS Synetta Fail, MD   5 Units at 01/17/22 2111   insulin glargine-yfgn (SEMGLEE) injection 30 Units  30 Units Subcutaneous QHS Synetta Fail, MD   30 Units at 01/14/22 2124   iron polysaccharides (NIFEREX) capsule 150 mg  150 mg Oral Daily Marguerita Merles [place], DO   150 mg at 01/17/22 3016   levothyroxine (SYNTHROID) tablet 100 mcg  100 mcg Oral QHS Synetta Fail, MD   100 mcg at 01/17/22 2101   lisinopril (ZESTRIL) tablet 2.5 mg  2.5 mg Oral Daily Synetta Fail, MD   2.5 mg at 01/16/22 0858   magnesium hydroxide (MILK OF MAGNESIA) suspension 15 mL  15 mL Oral Daily PRN Marguerita Merles Latif, DO   15 mL at 01/16/22 1209   melatonin tablet 3 mg  3 mg Oral QHS PRN Opyd, Lavone Neri, MD   3 mg at 01/17/22 2101   multivitamin (PROSIGHT) tablet 1 tablet  1 tablet Oral Daily Marguerita Merles [place], DO   1 tablet at 01/17/22 0109   naloxegol oxalate (MOVANTIK) tablet 25 mg  25 mg Oral Daily Candelaria Stagers T, MD   25 mg at 01/17/22 1236   oxyCODONE (Oxy IR/ROXICODONE) immediate release tablet 5-10 mg  5-10 mg Oral Q6H PRN Candelaria Stagers T, MD   10 mg at 01/18/22 0508   pantoprazole (PROTONIX) EC tablet 40 mg  40 mg Oral Daily Synetta Fail, MD   40 mg at 01/17/22 0835   pravastatin (PRAVACHOL) tablet 40 mg  40 mg Oral Daily Synetta Fail, MD   40 mg at 01/17/22 3235   senna-docusate (Senokot-S) tablet 3 tablet  3 tablet Oral Q0600 Marguerita Merles [place], DO   3 tablet at 01/18/22 5732   sodium chloride flush (NS) 0.9 % injection 3 mL  3 mL Intravenous Q12H Synetta Fail, MD   3 mL at 01/17/22 2100   vancomycin (VANCOREADY) IVPB 750  mg/150 mL  750 mg Intravenous Q24H Norva Pavlov, RPH 150 mL/hr at 01/17/22 2058 750 mg at 01/17/22 2058   vitamin B-12 (CYANOCOBALAMIN) tablet 1,000 mcg  1,000 mcg Oral Daily Marguerita Merles [place], DO   1,000 mcg at 01/17/22 2025     Discharge Medications: Please see discharge summary for a list of discharge medications.  Relevant Imaging Results:  Relevant Lab Results:   Additional Information SS#[id number]  Amada Jupiter, LCSW

## 2022-01-18 NOTE — Progress Notes (Signed)
Report called to [facility] roxanne Rn received report D Susann Givens RN

## 2022-01-19 ENCOUNTER — Telehealth: Payer: Self-pay

## 2022-01-19 NOTE — Telephone Encounter (Signed)
Erroneous encounter, pt is @ [employer] qualify for TCM at this time

## 2022-01-21 DIAGNOSIS — R69 Illness, unspecified: Secondary | ICD-10-CM | POA: Diagnosis not present

## 2022-01-21 DIAGNOSIS — R531 Weakness: Secondary | ICD-10-CM | POA: Diagnosis not present

## 2022-01-21 DIAGNOSIS — E7849 Other hyperlipidemia: Secondary | ICD-10-CM | POA: Diagnosis not present

## 2022-01-21 DIAGNOSIS — E1140 Type 2 diabetes mellitus with diabetic neuropathy, unspecified: Secondary | ICD-10-CM | POA: Diagnosis not present

## 2022-01-22 ENCOUNTER — Encounter: Payer: Medicare HMO | Admitting: Podiatry

## 2022-01-26 DIAGNOSIS — L03116 Cellulitis of left lower limb: Secondary | ICD-10-CM | POA: Diagnosis not present

## 2022-01-26 DIAGNOSIS — M6281 Muscle weakness (generalized): Secondary | ICD-10-CM | POA: Diagnosis not present

## 2022-01-29 ENCOUNTER — Ambulatory Visit (INDEPENDENT_AMBULATORY_CARE_PROVIDER_SITE_OTHER): Payer: Medicare HMO

## 2022-01-29 ENCOUNTER — Ambulatory Visit: Payer: Medicare HMO | Admitting: Podiatry

## 2022-01-29 DIAGNOSIS — Z9889 Other specified postprocedural states: Secondary | ICD-10-CM

## 2022-01-29 MED ORDER — HYDROCODONE-ACETAMINOPHEN 10-325 MG PO TABS: 1.0000 | ORAL_TABLET | ORAL | 0 refills | Status: AC | PRN

## 2022-01-29 NOTE — Progress Notes (Signed)
Subjective:  Patient presents today status post left foot tarsometatarsal arthrodesis with Lapidus type bunionectomy LT foot. DOS: 12/21/2021.  Patient is improving.  She continues to have pain and tenderness with swelling if she is on her foot for prolonged period of time.  She was recently discharged from [facility] health rehab yesterday and is now home.  She has physical therapy coming to the home.  No new complaints at this time.  Past Medical History:  Diagnosis Date   Allergy    Anxiety 2003   Carpal tunnel syndrome, bilateral    Cough    for over a year per pt-   Depression 1998   Diabetes mellitus without complication (HCC) 1998   Dupuytren's contracture of left hand    DVT (deep venous thrombosis) (HCC) 2007   left calf x2   GERD (gastroesophageal reflux disease) 1980   History of alcoholism (HCC)    Chronic   HLD (hyperlipidemia)    Hyperlipidemia 1990   Hypertension    Hypothyroidism 1998   Macular degeneration 07/31/2018   2020   Macular degeneration 12/2019   PONV (postoperative nausea and vomiting)    1 time per pt in 2001   Short-term memory loss    SOB (shortness of breath) on exertion      Past Surgical History:  Procedure Laterality Date   ANTERIOR CERVICAL DISCECTOMY  02/2018   BACK SURGERY     x3 Lumbar   CARPAL TUNNEL RELEASE Left 03/08/2016   Procedure: LEFT CARPAL TUNNEL RELEASE;  Surgeon: Betha Loa, MD;  Location: [facility];  Service: Orthopedics;  Laterality: Left;   CATARACT EXTRACTION Bilateral 6/21-7/21   CERVICAL POLYPECTOMY N/A 08/17/2013   Procedure: CERVICAL POLYPECTOMY;  Surgeon: Mickel Baas, MD;  Location: [facility];  Service: Gynecology;  Laterality: N/A;   COLON SURGERY  2007   benign mass   Dupuytren's release Left 2017   ECTOPIC PREGNANCY SURGERY  1979   ESOPHAGEAL DILATION     x3   EYE SURGERY     FOOT ARTHRODESIS Left 12/21/2021   Procedure: ARTHRODESIS LIS FRANC;  Surgeon: Felecia Shelling, DPM;   Location: [facility];  Service: Podiatry;  Laterality: Left;   HALLUX VALGUS LAPIDUS Left 12/21/2021   Procedure: HALLUX VALGUS LAPIDUS;  Surgeon: Felecia Shelling, DPM;  Location: [facility];  Service: Podiatry;  Laterality: Left;   HYSTEROSCOPY WITH D & C N/A 08/17/2013   Procedure: DILATATION AND CURETTAGE /HYSTEROSCOPY;  Surgeon: Mickel Baas, MD;  Location: [facility];  Service: Gynecology;  Laterality: N/A;  YAG LASER   NECK SURGERY     c5-7 ACDF 3/15/has screws and plate in neck   TONSILLECTOMY     TRIGGER FINGER RELEASE Left 03/08/2016   Procedure: LEFT LONG TRIGGER RELEASE AND LEFT RING TRIGGER RELEASE;  Surgeon: Betha Loa, MD;  Location: [facility];  Service: Orthopedics;  Laterality: Left;   UPPER GASTROINTESTINAL ENDOSCOPY     Allergies  Allergen Reactions   Toradol [Ketorolac Tromethamine] Other (See Comments)    Slurred speech, confusion   Doxycycline Nausea And Vomiting   Baclofen Other (See Comments)    Memory Loss and shakes   Morphine And Related Itching   Penicillins Rash    Has patient had a PCN reaction causing immediate rash, facial/tongue/throat swelling, SOB or lightheadedness with hypotension: Yes Has patient had a PCN reaction causing severe rash involving mucus membranes or skin necrosis: No Has patient had a PCN reaction that  required hospitalization: No Has patient had a PCN reaction occurring within the last 10 years: No If all of the above answers are "NO", then may proceed with Cephalosporin use.    Talwin [Pentazocine] Itching     Objective/Physical Exam Neurovascular status intact.  Skin incisions nicely healed.  Erythema resolved.  Moderate edema noted to the surgical foot Radiographic Exam 01/29/2022 LT foot:  Routine healing noted.  Orthopedic hardware and osteotomies sites appear to be stable with routine healing.  Orthopedic arthrodesis plates are intact  Assessment: 1. s/p tarsometatarsal arthrodesis with Lapidus and Aiken type  bunionectomy LT. DOS: 12/21/2021   Plan of Care:  1. Patient was evaluated. X-rays reviewed which seem to demonstrate routine healing of the arthrodesis sites 2.  Patient may begin to transition out of the postsurgical shoe into good supportive sneakers. Patient prefers sketchers so Best boy were recommended 3.  Patient completed all of her oral antibiotics.  Clinically no evidence of infection 4.  Compression ankle sleeve dispensed.  Wear daily 5.  Patient was just discharged from [facility] health rehab yesterday.  She is now at home.  Continue ambulation with the walker.  She has physical therapy coming to the house today. 6.  Refill prescription for Vicodin 10/325 mg.    7.  Return to clinic 4 weeks   Felecia Shelling, DPM [facility]  Dr. Felecia Shelling, DPM    [address] [place], [place] [address]                Office [phone]  Fax [phone]

## 2022-02-05 ENCOUNTER — Telehealth: Payer: Self-pay

## 2022-02-05 DIAGNOSIS — I10 Essential (primary) hypertension: Secondary | ICD-10-CM | POA: Diagnosis not present

## 2022-02-05 DIAGNOSIS — E782 Mixed hyperlipidemia: Secondary | ICD-10-CM | POA: Diagnosis not present

## 2022-02-05 DIAGNOSIS — R69 Illness, unspecified: Secondary | ICD-10-CM | POA: Diagnosis not present

## 2022-02-05 DIAGNOSIS — M25475 Effusion, left foot: Secondary | ICD-10-CM | POA: Diagnosis not present

## 2022-02-05 DIAGNOSIS — E1169 Type 2 diabetes mellitus with other specified complication: Secondary | ICD-10-CM | POA: Diagnosis not present

## 2022-02-05 DIAGNOSIS — K219 Gastro-esophageal reflux disease without esophagitis: Secondary | ICD-10-CM | POA: Diagnosis not present

## 2022-02-05 DIAGNOSIS — Z6832 Body mass index (BMI) 32.0-32.9, adult: Secondary | ICD-10-CM | POA: Diagnosis not present

## 2022-02-06 DIAGNOSIS — L03116 Cellulitis of left lower limb: Secondary | ICD-10-CM | POA: Diagnosis not present

## 2022-02-06 DIAGNOSIS — R41841 Cognitive communication deficit: Secondary | ICD-10-CM | POA: Diagnosis not present

## 2022-02-06 DIAGNOSIS — M6281 Muscle weakness (generalized): Secondary | ICD-10-CM | POA: Diagnosis not present

## 2022-02-08 ENCOUNTER — Other Ambulatory Visit: Payer: Self-pay | Admitting: Podiatry

## 2022-02-08 MED ORDER — HYDROCODONE-ACETAMINOPHEN 10-325 MG PO TABS: 1.0000 | ORAL_TABLET | Freq: Four times a day (QID) | ORAL | 0 refills | Status: DC | PRN

## 2022-02-08 NOTE — Telephone Encounter (Signed)
Prescription was sent to the pharmacy.  Please notify patient.  Please tell her sorry for the delay.  Thanks, Dr. Logan Bores

## 2022-02-09 DIAGNOSIS — E1169 Type 2 diabetes mellitus with other specified complication: Secondary | ICD-10-CM | POA: Diagnosis not present

## 2022-02-09 DIAGNOSIS — R69 Illness, unspecified: Secondary | ICD-10-CM | POA: Diagnosis not present

## 2022-02-09 DIAGNOSIS — I10 Essential (primary) hypertension: Secondary | ICD-10-CM | POA: Diagnosis not present

## 2022-02-09 DIAGNOSIS — F32A Depression, unspecified: Secondary | ICD-10-CM | POA: Diagnosis not present

## 2022-02-09 DIAGNOSIS — K219 Gastro-esophageal reflux disease without esophagitis: Secondary | ICD-10-CM | POA: Diagnosis not present

## 2022-02-09 DIAGNOSIS — M25475 Effusion, left foot: Secondary | ICD-10-CM | POA: Diagnosis not present

## 2022-02-09 DIAGNOSIS — E782 Mixed hyperlipidemia: Secondary | ICD-10-CM | POA: Diagnosis not present

## 2022-02-09 NOTE — Telephone Encounter (Signed)
Patient calling for status of  pain medicine, explained to patient that medication has been sent to pharmacy on file 07/27.

## 2022-02-10 LAB — LAB REPORT - SCANNED: EGFR: 95

## 2022-02-21 ENCOUNTER — Telehealth: Payer: Self-pay

## 2022-02-21 ENCOUNTER — Other Ambulatory Visit: Payer: Self-pay | Admitting: Podiatry

## 2022-02-21 MED ORDER — HYDROCODONE-ACETAMINOPHEN 10-325 MG PO TABS: 1.0000 | ORAL_TABLET | Freq: Four times a day (QID) | ORAL | 0 refills | Status: AC | PRN

## 2022-02-26 NOTE — Telephone Encounter (Signed)
This message has been sent to the provider to review.

## 2022-03-05 ENCOUNTER — Ambulatory Visit: Payer: Medicare HMO | Admitting: Podiatry

## 2022-03-05 ENCOUNTER — Ambulatory Visit: Payer: Medicare HMO | Admitting: Family Medicine

## 2022-03-05 DIAGNOSIS — R300 Dysuria: Secondary | ICD-10-CM | POA: Diagnosis not present

## 2022-03-05 DIAGNOSIS — N390 Urinary tract infection, site not specified: Secondary | ICD-10-CM | POA: Diagnosis not present

## 2022-03-11 ENCOUNTER — Other Ambulatory Visit: Payer: Self-pay | Admitting: Family Medicine

## 2022-03-12 NOTE — Telephone Encounter (Signed)
This says take a total of 1,200mg  by mouth 3x daily. I am not understand if this is a 600mg  dosage.  Sig: TAKE 2 TABLETS (1,200 MG TOTAL) BY MOUTH 3 (THREE) TIMES DAILY

## 2022-03-16 DIAGNOSIS — M25475 Effusion, left foot: Secondary | ICD-10-CM | POA: Diagnosis not present

## 2022-03-16 DIAGNOSIS — E782 Mixed hyperlipidemia: Secondary | ICD-10-CM | POA: Diagnosis not present

## 2022-03-16 DIAGNOSIS — E1169 Type 2 diabetes mellitus with other specified complication: Secondary | ICD-10-CM | POA: Diagnosis not present

## 2022-03-16 DIAGNOSIS — K219 Gastro-esophageal reflux disease without esophagitis: Secondary | ICD-10-CM | POA: Diagnosis not present

## 2022-03-16 DIAGNOSIS — F32A Depression, unspecified: Secondary | ICD-10-CM | POA: Diagnosis not present

## 2022-03-16 DIAGNOSIS — I10 Essential (primary) hypertension: Secondary | ICD-10-CM | POA: Diagnosis not present

## 2022-03-16 DIAGNOSIS — R69 Illness, unspecified: Secondary | ICD-10-CM | POA: Diagnosis not present

## 2022-03-20 ENCOUNTER — Ambulatory Visit (INDEPENDENT_AMBULATORY_CARE_PROVIDER_SITE_OTHER): Payer: Medicare HMO

## 2022-03-20 ENCOUNTER — Ambulatory Visit: Payer: Medicare HMO | Admitting: Podiatry

## 2022-03-20 DIAGNOSIS — Z9889 Other specified postprocedural states: Secondary | ICD-10-CM | POA: Diagnosis not present

## 2022-03-20 NOTE — Progress Notes (Addendum)
Chief Complaint  Patient presents with   Follow-up    Patient is here for follow-up for left foot, she had surgery in June 2023 still in pain.    Subjective:  Patient presents today status post left foot tarsometatarsal arthrodesis with Lapidus type bunionectomy LT foot. DOS: 12/21/2021.  Patient states that she is doing well.  She is weightbearing in tennis shoes today.  She does admit to going around barefoot at her home over the past month.  She has minimal pain during walking.  No new complaints at this time  Past Medical History:  Diagnosis Date   Allergy    Anxiety 2003   Carpal tunnel syndrome, bilateral    Cough    for over a year per pt-   Depression 1998   Diabetes mellitus without complication (HCC) 1998   Dupuytren's contracture of left hand    DVT (deep venous thrombosis) (HCC) 2007   left calf x2   GERD (gastroesophageal reflux disease) 1980   History of alcoholism (HCC)    Chronic   HLD (hyperlipidemia)    Hyperlipidemia 1990   Hypertension    Hypothyroidism 1998   Macular degeneration 07/31/2018   2020   Macular degeneration 12/2019   PONV (postoperative nausea and vomiting)    1 time per pt in 2001   Short-term memory loss    SOB (shortness of breath) on exertion      Past Surgical History:  Procedure Laterality Date   ANTERIOR CERVICAL DISCECTOMY  02/2018   BACK SURGERY     x3 Lumbar   CARPAL TUNNEL RELEASE Left 03/08/2016   Procedure: LEFT CARPAL TUNNEL RELEASE;  Surgeon: Betha Loa, MD;  Location: [facility];  Service: Orthopedics;  Laterality: Left;   CATARACT EXTRACTION Bilateral 6/21-7/21   CERVICAL POLYPECTOMY N/A 08/17/2013   Procedure: CERVICAL POLYPECTOMY;  Surgeon: Mickel Baas, MD;  Location: [facility];  Service: Gynecology;  Laterality: N/A;   COLON SURGERY  2007   benign mass   Dupuytren's release Left 2017   ECTOPIC PREGNANCY SURGERY  1979   ESOPHAGEAL DILATION     x3   EYE SURGERY     FOOT ARTHRODESIS Left  12/21/2021   Procedure: ARTHRODESIS LIS FRANC;  Surgeon: Felecia Shelling, DPM;  Location: [facility];  Service: Podiatry;  Laterality: Left;   HALLUX VALGUS LAPIDUS Left 12/21/2021   Procedure: HALLUX VALGUS LAPIDUS;  Surgeon: Felecia Shelling, DPM;  Location: [facility];  Service: Podiatry;  Laterality: Left;   HYSTEROSCOPY WITH D & C N/A 08/17/2013   Procedure: DILATATION AND CURETTAGE /HYSTEROSCOPY;  Surgeon: Mickel Baas, MD;  Location: [facility];  Service: Gynecology;  Laterality: N/A;  YAG LASER   NECK SURGERY     c5-7 ACDF 3/15/has screws and plate in neck   TONSILLECTOMY     TRIGGER FINGER RELEASE Left 03/08/2016   Procedure: LEFT LONG TRIGGER RELEASE AND LEFT RING TRIGGER RELEASE;  Surgeon: Betha Loa, MD;  Location: [facility];  Service: Orthopedics;  Laterality: Left;   UPPER GASTROINTESTINAL ENDOSCOPY     Allergies  Allergen Reactions   Toradol [Ketorolac Tromethamine] Other (See Comments)    Slurred speech, confusion   Doxycycline Nausea And Vomiting   Baclofen Other (See Comments)    Memory Loss and shakes   Morphine And Related Itching   Penicillins Rash    Has patient had a PCN reaction causing immediate rash, facial/tongue/throat swelling, SOB or lightheadedness with hypotension: Yes Has patient  had a PCN reaction causing severe rash involving mucus membranes or skin necrosis: No Has patient had a PCN reaction that required hospitalization: No Has patient had a PCN reaction occurring within the last 10 years: No If all of the above answers are "NO", then may proceed with Cephalosporin use.    Talwin [Pentazocine] Itching     Objective/Physical Exam Neurovascular status intact.  Skin incisions nicely healed.  No erythema.  Minimal edema noted.  Patient has no pain with palpation or with ambulation  Radiographic Exam 03/20/2022 LT foot:  Today there is a difference compared to x-rays taken prior.  There is callus formation around the diaphysis of the first  metatarsal with dorsal angulation to the distal part of the metatarsal.  This is at the very distal end of the internal fixation plates.  Findings are consistent with fracture of the diaphysis of the first metatarsal postoperatively.  There continues to be some radiolucency across the arthrodesis sites and she is now 90 days postoperative with no osseous consolidation across the surgical arthrodesis sites  Assessment: 1. s/p tarsometatarsal arthrodesis with Lapidus and Aiken type bunionectomy LT. DOS: 12/21/2021 2.  Fracture first metatarsal left foot   Plan of Care:  1. Patient was evaluated.  X-rays reviewed with the patient demonstrating the dorsal displacement of the distal half of the first metatarsal.   2.  The patient is now about 3 months postop.  Stressed the importance of wearing good supportive tennis shoes and wearing the cam boot if she knows that she is going to be on her feet for prolonged period of time.  She no longer has a cam boot.  New cam boot dispensed 3.  Patient admits to going around barefoot over the past month.  Advised against this 4.  Return to clinic 6 weeks for follow-up x-ray specifically for the first metatarsal  Felecia Shelling, DPM [facility]  Dr. Felecia Shelling, DPM    [address] [place], [place] [address]                Office [phone]  Fax [phone]

## 2022-03-21 ENCOUNTER — Encounter (INDEPENDENT_AMBULATORY_CARE_PROVIDER_SITE_OTHER): Payer: Self-pay | Admitting: *Deleted

## 2022-03-30 ENCOUNTER — Telehealth: Payer: Self-pay | Admitting: *Deleted

## 2022-03-30 NOTE — Patient Outreach (Signed)
  Care Coordination   03/30/2022 Name: Marissa Thompson MRN: [id number] DOB: May 03, 1951   Care Coordination Outreach Attempts:  An unsuccessful telephone outreach was attempted today to offer the patient information about available care coordination services as a benefit of their health plan.   Follow Up Plan:  Additional outreach attempts will be made to offer the patient care coordination information and services.   Encounter Outcome:  No Answer  Care Coordination Interventions Activated:  No   Care Coordination Interventions:  No, not indicated    Elliot Cousin, RN Care Management Coordinator [facility] Main Office [phone]

## 2022-04-09 ENCOUNTER — Other Ambulatory Visit: Payer: Self-pay | Admitting: Family Medicine

## 2022-04-09 ENCOUNTER — Ambulatory Visit: Payer: Self-pay

## 2022-04-09 DIAGNOSIS — F418 Other specified anxiety disorders: Secondary | ICD-10-CM

## 2022-04-09 NOTE — Patient Instructions (Signed)
Visit Information  Thank you for taking time to visit with me today. Please don't hesitate to contact me if I can be of assistance to you.   Following are the goals we discussed today:   Goals Addressed             This Visit's Progress    COMPLETED: Care Coordination Activities       Care Coordination Interventions: SDoH screening performed - no acute resource challenges identified at this time Determined the patient does not have concerns with medication costs and/or adherence Reviewed the patient has switched primary care providers from Dr. Jonni Sanger to Dr. Sherrie Sport since it is closer to home Instructed the patient to contact her primary care provider as needed        If you are experiencing a Mental Health or [place] or need someone to talk to, please call the [facility]: [phone]  Patient verbalizes understanding of instructions and care plan provided today and agrees to view in [place]. Active MyChart status and patient understanding of how to access instructions and care plan via MyChart confirmed with patient.     No further follow up required: Please contact your primary care provider as needed  Daneen Schick, Arita Miss, CDP Social Worker, Certified Dementia Practitioner [place] [facility] [phone]

## 2022-04-09 NOTE — Patient Outreach (Signed)
  Care Coordination   Initial Visit Note   04/09/2022 Name: Marissa Thompson MRN: [id number] DOB: 06-19-1951  Marissa Thompson is a 71 y.o. year old female who sees Hasanaj, Samul Dada, MD for primary care. I spoke with  Marissa Thompson by phone today.  What matters to the patients health and wellness today?  No concerns, doing well at this time    Goals Addressed             This Visit's Progress    COMPLETED: Care Coordination Activities       Care Coordination Interventions: SDoH screening performed - no acute resource challenges identified at this time Determined the patient does not have concerns with medication costs and/or adherence Reviewed the patient has switched primary care providers from Dr. Jonni Sanger to Dr. Sherrie Sport since it is closer to home Instructed the patient to contact her primary care provider as needed        SDOH assessments and interventions completed:  Yes  SDOH Interventions Today    Flowsheet Row Most Recent Value  SDOH Interventions   Food Insecurity Interventions Intervention Not Indicated  Housing Interventions Intervention Not Indicated  Transportation Interventions Intervention Not Indicated  Utilities Interventions Intervention Not Indicated        Care Coordination Interventions Activated:  Yes  Care Coordination Interventions:  Yes, provided   Follow up plan: No further intervention required.   Encounter Outcome:  Pt. Visit Completed   Daneen Schick, BSW, CDP Social Worker, Certified Dementia Practitioner [place] [facility]  Care Coordination [phone]

## 2022-04-20 ENCOUNTER — Other Ambulatory Visit: Payer: Self-pay | Admitting: Podiatry

## 2022-04-20 DIAGNOSIS — M960 Pseudarthrosis after fusion or arthrodesis: Secondary | ICD-10-CM

## 2022-05-02 ENCOUNTER — Ambulatory Visit: Payer: Medicare HMO | Admitting: Podiatry

## 2022-05-03 DIAGNOSIS — J40 Bronchitis, not specified as acute or chronic: Secondary | ICD-10-CM | POA: Diagnosis not present

## 2022-05-03 DIAGNOSIS — Z0000 Encounter for general adult medical examination without abnormal findings: Secondary | ICD-10-CM | POA: Diagnosis not present

## 2022-05-03 DIAGNOSIS — Z6830 Body mass index (BMI) 30.0-30.9, adult: Secondary | ICD-10-CM | POA: Diagnosis not present

## 2022-05-07 ENCOUNTER — Other Ambulatory Visit: Payer: Self-pay | Admitting: Podiatry

## 2022-05-10 ENCOUNTER — Ambulatory Visit (INDEPENDENT_AMBULATORY_CARE_PROVIDER_SITE_OTHER): Payer: Medicare HMO | Admitting: Orthopaedic Surgery

## 2022-05-10 ENCOUNTER — Encounter: Payer: Self-pay | Admitting: Orthopaedic Surgery

## 2022-05-10 ENCOUNTER — Ambulatory Visit (INDEPENDENT_AMBULATORY_CARE_PROVIDER_SITE_OTHER): Payer: Medicare HMO

## 2022-05-10 VITALS — Ht 59.0 in | Wt 159.0 lb

## 2022-05-10 DIAGNOSIS — M79672 Pain in left foot: Secondary | ICD-10-CM

## 2022-05-11 NOTE — Progress Notes (Signed)
Office Visit Note   Patient: Marissa Thompson           Date of Birth: 09-20-1950           MRN: [id number] Visit Date: 05/10/2022              Requested by: Neale Burly, MD [place],  [place] [id number] PCP: Neale Burly, MD   Assessment & Plan: Visit Diagnoses:  1. Pain in left foot   2.     First MT shaft fracture with nonunion   Plan: Reviewed x-rays patient has a nonunion she states she really does not want more surgery likely she probably would do well with rigid shoe with some arch support for the midfoot and rigid shoe last to decrease stress on the first metatarsal head with her nonunion.  We will have her see Dr.Duda in [place] and he can discuss options with her.  Follow-Up Instructions: No follow-ups on file.   Orders:  Orders Placed This Encounter  Procedures   XR Foot Complete Left   No orders of the defined types were placed in this encounter.     Procedures: No procedures performed   Clinical Data: No additional findings.   Subjective: Chief Complaint  Patient presents with   Left Foot - Pain    HPI 71 year old female seen with left foot pain and problems.  She had surgery by podiatrist Dr. Amalia Hailey 12/21/2021 for bunions and had second and third cuneiform metatarsal fusion as well as first cuneiform first metatarsal fusion with double plates on the first metatarsal.  She states she was on crutches then later a walker and then a cane was in a boot for period time 2 different ones and has pain in her great toe.  Original problem was bunion and also some flatfeet problems.  She states she has had persistent pain in the great toe and points to the mid metatarsal region.  X-rays show fracture distal to the plates on the first metatarsal shaft with nonunion.  Patient denies numbness problems in her feet.  Patient referred by Dr.Hasanaj.  Review of Systems positive for hypertension diabetes without significant elevations of A1c.  She has had  Dupuytren's in her left hand carpal tunnel in the right some problems with depression alcoholism cervical fusion without myelopathic changes and there was no cord myelopathy.   Objective: Vital Signs: Ht 4\' 11"  (1.499 m)   Wt 159 lb (72.1 kg)   BMI 32.11 kg/m   Physical Exam Constitutional:      Appearance: She is well-developed.  HENT:     Head: Normocephalic.     Right Ear: External ear normal.     Left Ear: External ear normal. There is no impacted cerumen.  Eyes:     Pupils: Pupils are equal, round, and reactive to light.  Neck:     Thyroid: No thyromegaly.     Trachea: No tracheal deviation.  Cardiovascular:     Rate and Rhythm: Normal rate.  Pulmonary:     Effort: Pulmonary effort is normal.  Abdominal:     Palpations: Abdomen is soft.  Musculoskeletal:     Cervical back: No rigidity.  Skin:    General: Skin is warm and dry.  Neurological:     Mental Status: She is alert and oriented to person, place, and time.  Psychiatric:        Behavior: Behavior normal.     Ortho Exam patient ambulates in sandals  she has trace motion of first metatarsal midshaft left foot and tenderness.  Good capillary refill.  2 incisions over the dorsum of the foot 1 between the second and third metatarsals and tarsal joint and the other over the first.  Specialty Comments:  No specialty comments available.  Imaging: XR Foot Complete Left  Result Date: 05/11/2022 Three-view x-rays left foot demonstrates tarsometatarsal fusions second and third with plate that appears healed screws and plates are intact.  2 plates on the first metatarsocuneiform fusion with nonunion distal to the plates with either stress fracture or acute fracture with hypertrophic changes.  Patient also has a screw from proximal phalanx osteotomy.  Proximal phalanx appears healed. Impression: Left second third tarsometatarsal fusions with plate solid.  First cuneiform first metatarsal fusion solid with shaft fracture  distal to the plates and nonunion.    PMFS History: Patient Active Problem List   Diagnosis Date Noted   Cellulitis of left foot 01/10/2022   Restless leg syndrome 08/08/2018   Macular degeneration 07/31/2018   Seborrheic dermatitis of scalp 05/13/2018   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   History of alcoholism (HCC) 04/28/2018   S/P cervical discectomy 04/28/2018   Eczema of both external ears 12/19/2017   Major depression, recurrent, chronic (HCC) 08/29/2017   Carpal tunnel syndrome on right 08/29/2016   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/2016   Dupuytren's contracture of left hand 12/21/2015   Uncontrolled IDDM-2 with hyperglycemia and neuropathy 03/09/2015   Memory loss 03/08/2015   Dysphagia, pharyngoesophageal phase 12/29/2014   Vitamin D deficiency 03/03/2014   Allergy    Hypertension associated with diabetes (HCC)    GERD (gastroesophageal reflux disease)    Mixed hyperlipidemia    Hypothyroidism    Past Medical History:  Diagnosis Date   Allergy    Anxiety 2003   Carpal tunnel syndrome, bilateral    Cough    for over a year per pt-   Depression 1998   Diabetes mellitus without complication (HCC) 1998   Dupuytren's contracture of left hand    DVT (deep venous thrombosis) (HCC) 2007   left calf x2   GERD (gastroesophageal reflux disease) 1980   History of alcoholism (HCC)    Chronic   HLD (hyperlipidemia)    Hyperlipidemia 1990   Hypertension    Hypothyroidism 1998   Macular degeneration 07/31/2018   2020   Macular degeneration 12/2019   PONV (postoperative nausea and vomiting)    1 time per pt in 2001   Short-term memory loss    SOB (shortness of breath) on exertion     Family History  Problem Relation Age of Onset   Depression Mother    Diabetes Mother    Hypertension Mother    Alcohol abuse Father    Arthritis Father    Cancer Father 45       Oral Cancer- had tongue, jaw resection--smoker and alcohol   Alcohol abuse Brother     Past Surgical History:   Procedure Laterality Date   ANTERIOR CERVICAL DISCECTOMY  02/2018   BACK SURGERY     x3 Lumbar   CARPAL TUNNEL RELEASE Left 03/08/2016   Procedure: LEFT CARPAL TUNNEL RELEASE;  Surgeon: Betha Loa, MD;  Location: [facility];  Service: Orthopedics;  Laterality: Left;   CATARACT EXTRACTION Bilateral 6/21-7/21   CERVICAL POLYPECTOMY N/A 08/17/2013   Procedure: CERVICAL POLYPECTOMY;  Surgeon: Mickel Baas, MD;  Location: [facility];  Service: Gynecology;  Laterality: N/A;   COLON SURGERY  2007  benign mass   Dupuytren's release Left 2017   ECTOPIC PREGNANCY SURGERY  1979   ESOPHAGEAL DILATION     x3   EYE SURGERY     FOOT ARTHRODESIS Left 12/21/2021   Procedure: ARTHRODESIS LIS FRANC;  Surgeon: Felecia Shelling, DPM;  Location: [facility];  Service: Podiatry;  Laterality: Left;   HALLUX VALGUS LAPIDUS Left 12/21/2021   Procedure: HALLUX VALGUS LAPIDUS;  Surgeon: Felecia Shelling, DPM;  Location: [facility];  Service: Podiatry;  Laterality: Left;   HYSTEROSCOPY WITH D & C N/A 08/17/2013   Procedure: DILATATION AND CURETTAGE /HYSTEROSCOPY;  Surgeon: Mickel Baas, MD;  Location: [facility];  Service: Gynecology;  Laterality: N/A;  YAG LASER   NECK SURGERY     c5-7 ACDF 3/15/has screws and plate in neck   TONSILLECTOMY     TRIGGER FINGER RELEASE Left 03/08/2016   Procedure: LEFT LONG TRIGGER RELEASE AND LEFT RING TRIGGER RELEASE;  Surgeon: Betha Loa, MD;  Location: [facility];  Service: Orthopedics;  Laterality: Left;   UPPER GASTROINTESTINAL ENDOSCOPY     Social History   Occupational History   Occupation: Nurse  Tobacco Use   Smoking status: Never   Smokeless tobacco: Never  Vaping Use   Vaping Use: Never used  Substance and Sexual Activity   Alcohol use: Not Currently   Drug use: No   Sexual activity: Not Currently    Birth control/protection: Post-menopausal

## 2022-05-24 ENCOUNTER — Ambulatory Visit (INDEPENDENT_AMBULATORY_CARE_PROVIDER_SITE_OTHER): Payer: Medicare HMO | Admitting: Orthopedic Surgery

## 2022-05-24 DIAGNOSIS — M79672 Pain in left foot: Secondary | ICD-10-CM

## 2022-05-24 MED ORDER — HYDROCODONE-ACETAMINOPHEN 5-325 MG PO TABS: 1.0000 | ORAL_TABLET | Freq: Four times a day (QID) | ORAL | 0 refills | Status: DC | PRN

## 2022-06-05 ENCOUNTER — Encounter: Payer: Self-pay | Admitting: Orthopedic Surgery

## 2022-06-05 NOTE — Progress Notes (Signed)
Office Visit Note   Patient: Marissa Thompson           Date of Birth: 09-16-1950           MRN: [id number] Visit Date: 05/24/2022              Requested by: Neale Burly, MD [place],  [place] [id number] PCP: Neale Burly, MD  Chief Complaint  Patient presents with   Left Foot - Pain      HPI: Patient is a 71 year old woman who is seen in referral from Dr. Lorin Mercy.  Patient had bunion surgery with Dr. Amalia Hailey on June 8.  Patient complains of pain and swelling in her left foot.  Most recent radiographs were obtained October 26.  Patient states she had an infection from the [facility] osteotomy.  Patient states her great toe is broken.  Patient complains of pain across the base of the first metatarsal.  Patient has been treated at [facility] with IV antibiotics for 1 week and was discharged on oral antibiotics.  Assessment & Plan: Visit Diagnoses:  1. Pain in left foot     Plan: Recommended a carbon plate or stiff soled walking shoe reevaluate in 4 weeks with three-view radiographs of the left foot.  Discussed that we may need to get a CT scan to evaluate for fibrous union and may need to obtain ankle-brachial indices.  Follow-Up Instructions: No follow-ups on file.   Ortho Exam  Patient is alert, oriented, no adenopathy, well-dressed, normal affect, normal respiratory effort. Examination patient has a faint palpable dorsalis pedis pulse she has pain across the osteotomy site of the base of the first metatarsal.  There is no cellulitis no open wounds there is swelling.  Review of the radiographs shows plates across osteotomies at the base of the second and third metatarsal as well as 2 plates across the base of the first metatarsal osteotomy site.  There is a screw across the [facility] osteotomy site no destructive bony changes of the proximal phalanx there is hypertrophic callus and suggestion of a fibrous union of the first metatarsal.  Imaging: No results found. No  images are attached to the encounter.  Labs: Lab Results  Component Value Date   HGBA1C 7.6 (H) 12/06/2021   HGBA1C 9.0 (H) 10/02/2021   HGBA1C 7.2 (A) 02/03/2021   ESRSEDRATE 11 01/10/2022   CRP 0.6 01/10/2022   LABURIC 4.4 01/10/2022   REPTSTATUS 01/16/2022 FINAL 01/11/2022   GRAMSTAIN  07/23/2014    RARE WBC PRESENT,BOTH PMN AND MONONUCLEAR NO SQUAMOUS EPITHELIAL CELLS SEEN RARE GRAM POSITIVE COCCI IN PAIRS IN CLUSTERS Performed at [facility]  01/11/2022    NO GROWTH 5 DAYS Performed at [facility], [place] [address] [place],  [address]    LABORGA STAPHYLOCOCCUS AUREUS 07/23/2014     Lab Results  Component Value Date   ALBUMIN 3.2 (L) 01/17/2022   ALBUMIN 3.4 (L) 01/16/2022   ALBUMIN 3.6 01/15/2022    Lab Results  Component Value Date   MG 2.0 01/17/2022   MG 2.0 01/16/2022   MG 2.1 01/15/2022   Lab Results  Component Value Date   VD25OH 25.91 (L) 10/11/2020   VD25OH 18.50 (L) 09/16/2019   VD25OH 13 (L) 12/10/2013    No results found for: "PREALBUMIN"    Latest Ref Rng & Units 01/17/2022    3:22 AM 01/16/2022    3:55 AM 01/15/2022  3:43 AM  CBC EXTENDED  WBC 4.0 - 10.5 K/uL 4.5  4.8  4.9   RBC 3.87 - 5.11 MIL/uL 3.86  3.97  4.26   Hemoglobin 12.0 - 15.0 g/dL 51.8  84.1  66.0   HCT 36.0 - 46.0 % 32.9  33.3  36.3   Platelets 150 - 400 K/uL 211  236  261   NEUT# 1.7 - 7.7 K/uL 1.8  2.2  2.1   Lymph# 0.7 - 4.0 K/uL 2.0  1.9  2.1      There is no height or weight on file to calculate BMI.  Orders:  No orders of the defined types were placed in this encounter.  Meds ordered this encounter  Medications   HYDROcodone-acetaminophen (NORCO/VICODIN) 5-325 MG tablet    Sig: Take 1 tablet by mouth every 6 (six) hours as needed for moderate pain.    Dispense:  30 tablet    Refill:  0     Procedures: No procedures performed  Clinical Data: No additional findings.  ROS:  All other systems negative, except as noted in  the HPI. Review of Systems  Objective: Vital Signs: There were no vitals taken for this visit.  Specialty Comments:  No specialty comments available.  PMFS History: Patient Active Problem List   Diagnosis Date Noted   Cellulitis of left foot 01/10/2022   Restless leg syndrome 08/08/2018   Macular degeneration 07/31/2018   Seborrheic dermatitis of scalp 05/13/2018   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   History of alcoholism (HCC) 04/28/2018   S/P cervical discectomy 04/28/2018   Eczema of both external ears 12/19/2017   Major depression, recurrent, chronic (HCC) 08/29/2017   Carpal tunnel syndrome on right 08/29/2016   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/2016   Dupuytren's contracture of left hand 12/21/2015   Uncontrolled IDDM-2 with hyperglycemia and neuropathy 03/09/2015   Memory loss 03/08/2015   Dysphagia, pharyngoesophageal phase 12/29/2014   Vitamin D deficiency 03/03/2014   Allergy    Hypertension associated with diabetes (HCC)    GERD (gastroesophageal reflux disease)    Mixed hyperlipidemia    Hypothyroidism    Past Medical History:  Diagnosis Date   Allergy    Anxiety 2003   Carpal tunnel syndrome, bilateral    Cough    for over a year per pt-   Depression 1998   Diabetes mellitus without complication (HCC) 1998   Dupuytren's contracture of left hand    DVT (deep venous thrombosis) (HCC) 2007   left calf x2   GERD (gastroesophageal reflux disease) 1980   History of alcoholism (HCC)    Chronic   HLD (hyperlipidemia)    Hyperlipidemia 1990   Hypertension    Hypothyroidism 1998   Macular degeneration 07/31/2018   2020   Macular degeneration 12/2019   PONV (postoperative nausea and vomiting)    1 time per pt in 2001   Short-term memory loss    SOB (shortness of breath) on exertion     Family History  Problem Relation Age of Onset   Depression Mother    Diabetes Mother    Hypertension Mother    Alcohol abuse Father    Arthritis Father    Cancer Father 78        Oral Cancer- had tongue, jaw resection--smoker and alcohol   Alcohol abuse Brother     Past Surgical History:  Procedure Laterality Date   ANTERIOR CERVICAL DISCECTOMY  02/2018   BACK SURGERY     x3 Lumbar   CARPAL TUNNEL  RELEASE Left 03/08/2016   Procedure: LEFT CARPAL TUNNEL RELEASE;  Surgeon: Leanora Cover, MD;  Location: [facility];  Service: Orthopedics;  Laterality: Left;   CATARACT EXTRACTION Bilateral 6/21-7/21   CERVICAL POLYPECTOMY N/A 08/17/2013   Procedure: CERVICAL POLYPECTOMY;  Surgeon: Sharene Butters, MD;  Location: [facility];  Service: Gynecology;  Laterality: N/A;   COLON SURGERY  2007   benign mass   Dupuytren's release Left 2017   ECTOPIC PREGNANCY SURGERY  1979   ESOPHAGEAL DILATION     x3   EYE SURGERY     FOOT ARTHRODESIS Left 12/21/2021   Procedure: ARTHRODESIS LIS [place];  Surgeon: Edrick Kins, DPM;  Location[facility];  Service: Podiatry;  Laterality: Left;   HALLUX VALGUS LAPIDUS Left 12/21/2021   Procedure: HALLUX VALGUS LAPIDUS;  Surgeon: Edrick Kins, DPM;  Location[facility];  Service: Podiatry;  Laterality: Left;   HYSTEROSCOPY WITH D & C N/A 08/17/2013   Procedure: DILATATION AND CURETTAGE /HYSTEROSCOPY;  Surgeon: Sharene Butters, MD;  Location: [facility];  Service: Gynecology;  Laterality: N/A;  YAG LASER   NECK SURGERY     c5-7 ACDF 3/15/has screws and plate in neck   TONSILLECTOMY     TRIGGER FINGER RELEASE Left 03/08/2016   Procedure: LEFT LONG TRIGGER RELEASE AND LEFT RING TRIGGER RELEASE;  Surgeon: Leanora Cover, MD;  Location: [facility];  Service: Orthopedics;  Laterality: Left;   UPPER GASTROINTESTINAL ENDOSCOPY     Social History   Occupational History   Occupation: Nurse  Tobacco Use   Smoking status: Never   Smokeless tobacco: Never  Vaping Use   Vaping Use: Never used  Substance and Sexual Activity   Alcohol use: Not Currently   Drug use: No   Sexual activity: Not Currently    Birth control/protection:  Post-menopausal

## 2022-06-14 ENCOUNTER — Telehealth: Payer: Self-pay | Admitting: Orthopedic Surgery

## 2022-06-14 NOTE — Telephone Encounter (Signed)
Dr. Lajoyce Corners last sent hydrocodone on 05/24/22. Has been seen for left foot pain, hx of infected [place] osteotomy

## 2022-06-14 NOTE — Telephone Encounter (Signed)
Patient requesting [facility] [address]

## 2022-06-15 MED ORDER — HYDROCODONE-ACETAMINOPHEN 5-325 MG PO TABS: 1.0000 | ORAL_TABLET | Freq: Four times a day (QID) | ORAL | 0 refills | Status: DC | PRN

## 2022-06-15 NOTE — Addendum Note (Signed)
Addended by: Adonis Huguenin on: 06/15/2022 08:54 AM   Modules accepted: Orders

## 2022-06-21 ENCOUNTER — Ambulatory Visit (INDEPENDENT_AMBULATORY_CARE_PROVIDER_SITE_OTHER): Payer: Medicare HMO | Admitting: Orthopedic Surgery

## 2022-06-21 ENCOUNTER — Encounter: Payer: Self-pay | Admitting: Orthopedic Surgery

## 2022-06-21 ENCOUNTER — Ambulatory Visit (INDEPENDENT_AMBULATORY_CARE_PROVIDER_SITE_OTHER): Payer: Medicare HMO

## 2022-06-21 DIAGNOSIS — M9689 Other intraoperative and postprocedural complications and disorders of the musculoskeletal system: Secondary | ICD-10-CM | POA: Diagnosis not present

## 2022-06-21 DIAGNOSIS — M79672 Pain in left foot: Secondary | ICD-10-CM | POA: Diagnosis not present

## 2022-06-21 NOTE — Progress Notes (Signed)
Office Visit Note   Patient: Marissa Thompson           Date of Birth: 1950-12-11           MRN: [id number] Visit Date: 06/21/2022              Requested by: Toma Deiters, MD [address] [place],  [place] [address] PCP: Toma Deiters, MD  Chief Complaint  Patient presents with   Left Foot - Follow-up      HPI: Patient is a 71 year old woman who is status post midfoot reconstruction on the left.  Patient has had persistent global pain across the midfoot.  She tried a carbon fiber plate to unload pressure from the midfoot and this provided her no relief.  Assessment & Plan: Visit Diagnoses:  1. Pain in left foot   2. Nonunion of bone after osteotomy     Plan: With the radiographs showing signs of fibrous nonunion across the osteotomy sites of the midfoot.  Will order CT scan to further evaluate the midfoot.  Follow-up in the office after the CT scan is obtained.  Follow-Up Instructions: No follow-ups on file.   Ortho Exam  Patient is alert, oriented, no adenopathy, well-dressed, normal affect, normal respiratory effort. Examination patient has a good palpable dorsalis pedis and posterior tibial pulse.  Do not feel an ankle-brachial indices is needed.  Patient is tender to palpation across the midfoot pain with distraction pain worse at the base of the first and second metatarsals.  Radiograph showing fibrous bone at these osteotomy sites.  Imaging: No results found. No images are attached to the encounter.  Labs: Lab Results  Component Value Date   HGBA1C 7.6 (H) 12/06/2021   HGBA1C 9.0 (H) 10/02/2021   HGBA1C 7.2 (A) 02/03/2021   ESRSEDRATE 11 01/10/2022   CRP 0.6 01/10/2022   LABURIC 4.4 01/10/2022   REPTSTATUS 01/16/2022 FINAL 01/11/2022   GRAMSTAIN  07/23/2014    RARE WBC PRESENT,BOTH PMN AND MONONUCLEAR NO SQUAMOUS EPITHELIAL CELLS SEEN RARE GRAM POSITIVE COCCI IN PAIRS IN CLUSTERS Performed at [facility]    CULT  01/11/2022    NO GROWTH  5 DAYS Performed at [facility], [address], [place], [place] [address]    [facility] STAPHYLOCOCCUS AUREUS 07/23/2014     Lab Results  Component Value Date   ALBUMIN 3.2 (L) 01/17/2022   ALBUMIN 3.4 (L) 01/16/2022   ALBUMIN 3.6 01/15/2022    Lab Results  Component Value Date   MG 2.0 01/17/2022   MG 2.0 01/16/2022   MG 2.1 01/15/2022   Lab Results  Component Value Date   VD25OH 25.91 (L) 10/11/2020   VD25OH 18.50 (L) 09/16/2019   VD25OH 13 (L) 12/10/2013    No results found for: "PREALBUMIN"    Latest Ref Rng & Units 01/17/2022    3:22 AM 01/16/2022    3:55 AM 01/15/2022    3:43 AM  CBC EXTENDED  WBC 4.0 - 10.5 K/uL 4.5  4.8  4.9   RBC 3.87 - 5.11 MIL/uL 3.86  3.97  4.26   Hemoglobin 12.0 - 15.0 g/dL 68.3  41.9  62.2   HCT 36.0 - 46.0 % 32.9  33.3  36.3   Platelets 150 - 400 K/uL 211  236  261   NEUT# 1.7 - 7.7 K/uL 1.8  2.2  2.1   Lymph# 0.7 - 4.0 K/uL 2.0  1.9  2.1      There is  no height or weight on file to calculate BMI.  Orders:  Orders Placed This Encounter  Procedures   XR Foot Complete Left   CT FOOT LEFT WO CONTRAST   No orders of the defined types were placed in this encounter.    Procedures: No procedures performed  Clinical Data: No additional findings.  ROS:  All other systems negative, except as noted in the HPI. Review of Systems  Objective: Vital Signs: There were no vitals taken for this visit.  Specialty Comments:  No specialty comments available.  PMFS History: Patient Active Problem List   Diagnosis Date Noted   Cellulitis of left foot 01/10/2022   Restless leg syndrome 08/08/2018   Macular degeneration 07/31/2018   Seborrheic dermatitis of scalp 05/13/2018   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   History of alcoholism (HCC) 04/28/2018   S/P cervical discectomy 04/28/2018   Eczema of both external ears 12/19/2017   Major depression, recurrent, chronic (HCC) 08/29/2017   Carpal tunnel syndrome on right 08/29/2016    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/2016   Dupuytren's contracture of left hand 12/21/2015   Uncontrolled IDDM-2 with hyperglycemia and neuropathy 03/09/2015   Memory loss 03/08/2015   Dysphagia, pharyngoesophageal phase 12/29/2014   Vitamin D deficiency 03/03/2014   Allergy    Hypertension associated with diabetes (HCC)    GERD (gastroesophageal reflux disease)    Mixed hyperlipidemia    Hypothyroidism    Past Medical History:  Diagnosis Date   Allergy    Anxiety 2003   Carpal tunnel syndrome, bilateral    Cough    for over a year per pt-   Depression 1998   Diabetes mellitus without complication (HCC) 1998   Dupuytren's contracture of left hand    DVT (deep venous thrombosis) (HCC) 2007   left calf x2   GERD (gastroesophageal reflux disease) 1980   History of alcoholism (HCC)    Chronic   HLD (hyperlipidemia)    Hyperlipidemia 1990   Hypertension    Hypothyroidism 1998   Macular degeneration 07/31/2018   2020   Macular degeneration 12/2019   PONV (postoperative nausea and vomiting)    1 time per pt in 2001   Short-term memory loss    SOB (shortness of breath) on exertion     Family History  Problem Relation Age of Onset   Depression Mother    Diabetes Mother    Hypertension Mother    Alcohol abuse Father    Arthritis Father    Cancer Father 39       Oral Cancer- had tongue, jaw resection--smoker and alcohol   Alcohol abuse Brother     Past Surgical History:  Procedure Laterality Date   ANTERIOR CERVICAL DISCECTOMY  02/2018   BACK SURGERY     x3 Lumbar   CARPAL TUNNEL RELEASE Left 03/08/2016   Procedure: LEFT CARPAL TUNNEL RELEASE;  Surgeon: Betha Loa, MD;  Location: [facility];  Service: Orthopedics;  Laterality: Left;   CATARACT EXTRACTION Bilateral 6/21-7/21   CERVICAL POLYPECTOMY N/A 08/17/2013   Procedure: CERVICAL POLYPECTOMY;  Surgeon: Mickel Baas, MD;  [facility];  Service: Gynecology;  Laterality: N/A;   COLON SURGERY  2007   benign  mass   Dupuytren's release Left 2017   ECTOPIC PREGNANCY SURGERY  1979   ESOPHAGEAL DILATION     x3   EYE SURGERY     FOOT ARTHRODESIS Left 12/21/2021   Procedure: ARTHRODESIS LIS FRANC;  Surgeon: Felecia Shelling, DPM;  [facility];  Service: Podiatry;  Laterality: Left;   HALLUX VALGUS LAPIDUS Left 12/21/2021   Procedure: HALLUX VALGUS LAPIDUS;  Surgeon: Felecia Shelling, DPM;  [facility];  Service: Podiatry;  Laterality: Left;   HYSTEROSCOPY WITH D & C N/A 08/17/2013   Procedure: DILATATION AND CURETTAGE /HYSTEROSCOPY;  Surgeon: Mickel Baas, MD;  [facility];  Service: Gynecology;  Laterality: N/A;  YAG LASER   NECK SURGERY     c5-7 ACDF 3/15/has screws and plate in neck   TONSILLECTOMY     TRIGGER FINGER RELEASE Left 03/08/2016   Procedure: LEFT LONG TRIGGER RELEASE AND LEFT RING TRIGGER RELEASE;  Surgeon: Betha Loa, MD;  Location: [facility];  Service: Orthopedics;  Laterality: Left;   UPPER GASTROINTESTINAL ENDOSCOPY     Social History   Occupational History   Occupation: Nurse  Tobacco Use   Smoking status: Never   Smokeless tobacco: Never  Vaping Use   Vaping Use: Never used  Substance and Sexual Activity   Alcohol use: Not Currently   Drug use: No   Sexual activity: Not Currently    Birth control/protection: Post-menopausal

## 2022-06-29 ENCOUNTER — Telehealth: Payer: Self-pay | Admitting: Orthopedic Surgery

## 2022-06-29 ENCOUNTER — Other Ambulatory Visit: Payer: Self-pay | Admitting: Orthopedic Surgery

## 2022-06-29 MED ORDER — HYDROCODONE-ACETAMINOPHEN 5-325 MG PO TABS: 1.0000 | ORAL_TABLET | Freq: Four times a day (QID) | ORAL | 0 refills | Status: DC | PRN

## 2022-06-29 NOTE — Telephone Encounter (Signed)
I called pt and advised that has been sent to pharm.

## 2022-06-29 NOTE — Telephone Encounter (Signed)
Patient called. [facility] [address] Her call back number is [phone]

## 2022-06-29 NOTE — Telephone Encounter (Signed)
Pt is sch for CT scan on 07/02/2022 to eval nonunion previous mid foot recon with another doctor. She is calling asking for refill on Hydrocodone. Please advise.

## 2022-07-02 ENCOUNTER — Other Ambulatory Visit: Payer: Medicare HMO

## 2022-07-06 ENCOUNTER — Ambulatory Visit: Payer: Medicare HMO | Admitting: Family

## 2022-07-10 DIAGNOSIS — E782 Mixed hyperlipidemia: Secondary | ICD-10-CM | POA: Diagnosis not present

## 2022-07-10 DIAGNOSIS — I10 Essential (primary) hypertension: Secondary | ICD-10-CM | POA: Diagnosis not present

## 2022-07-10 DIAGNOSIS — K219 Gastro-esophageal reflux disease without esophagitis: Secondary | ICD-10-CM | POA: Diagnosis not present

## 2022-07-10 DIAGNOSIS — Z6830 Body mass index (BMI) 30.0-30.9, adult: Secondary | ICD-10-CM | POA: Diagnosis not present

## 2022-07-10 DIAGNOSIS — R69 Illness, unspecified: Secondary | ICD-10-CM | POA: Diagnosis not present

## 2022-07-10 DIAGNOSIS — E1169 Type 2 diabetes mellitus with other specified complication: Secondary | ICD-10-CM | POA: Diagnosis not present

## 2022-07-12 ENCOUNTER — Telehealth: Payer: Self-pay | Admitting: Orthopedic Surgery

## 2022-07-12 NOTE — Telephone Encounter (Signed)
Dr. Lajoyce Corners returns to office on 07/13/22. Will send to him to refill hydrocodone. Last filled 06/29/22 #30.

## 2022-07-12 NOTE — Telephone Encounter (Signed)
Pt called requesting refill of hydrocodone. Please send to pharmacy on file. Pt phone number is [phone].

## 2022-07-13 ENCOUNTER — Other Ambulatory Visit: Payer: Self-pay | Admitting: Orthopedic Surgery

## 2022-07-13 MED ORDER — HYDROCODONE-ACETAMINOPHEN 5-325 MG PO TABS: 1.0000 | ORAL_TABLET | Freq: Four times a day (QID) | ORAL | 0 refills | Status: DC | PRN

## 2022-07-18 ENCOUNTER — Other Ambulatory Visit: Payer: Medicare HMO

## 2022-07-20 ENCOUNTER — Ambulatory Visit
Admission: RE | Admit: 2022-07-20 | Discharge: 2022-07-20 | Disposition: A | Discharge status: Home / Self Care | Payer: Medicare HMO | Source: Ambulatory Visit | Attending: Orthopedic Surgery | Admitting: Orthopedic Surgery

## 2022-07-20 DIAGNOSIS — M79672 Pain in left foot: Secondary | ICD-10-CM

## 2022-07-20 DIAGNOSIS — M7989 Other specified soft tissue disorders: Secondary | ICD-10-CM | POA: Diagnosis not present

## 2022-07-27 ENCOUNTER — Telehealth: Payer: Self-pay | Admitting: Orthopedic Surgery

## 2022-07-27 MED ORDER — HYDROCODONE-ACETAMINOPHEN 5-325 MG PO TABS: 1.0000 | ORAL_TABLET | Freq: Four times a day (QID) | ORAL | 0 refills | Status: DC | PRN

## 2022-07-27 NOTE — Telephone Encounter (Signed)
Pt called requesting a refill of [facility] [address] Pt phone number is [phone].

## 2022-07-27 NOTE — Telephone Encounter (Signed)
Hydrocodone last filled 07/13/22 #30

## 2022-08-06 ENCOUNTER — Encounter: Payer: Self-pay | Admitting: Orthopedic Surgery

## 2022-08-06 ENCOUNTER — Ambulatory Visit: Payer: Medicare HMO | Admitting: Orthopedic Surgery

## 2022-08-06 DIAGNOSIS — M9689 Other intraoperative and postprocedural complications and disorders of the musculoskeletal system: Secondary | ICD-10-CM

## 2022-08-06 DIAGNOSIS — M79672 Pain in left foot: Secondary | ICD-10-CM | POA: Diagnosis not present

## 2022-08-06 NOTE — Progress Notes (Signed)
Office Visit Note   Patient: Marissa Thompson           Date of Birth: January 06, 1951           MRN: [id number] Visit Date: 08/06/2022              Requested by: Neale Burly, MD [place],  [place] [address] PCP: Neale Burly, MD  Chief Complaint  Patient presents with   Left Foot - Follow-up    CT review      HPI: Patient is a 72 year old woman who presents in follow-up status post CT scan of her left foot.  Patient has had multiple fusions and osteotomies throughout the left foot.  Assessment & Plan: Visit Diagnoses:  1. 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 Pain in left foot     Plan: The CT scan does show degenerative changes between the metatarsals with stable fusion across the first and second metatarsal.  There is shortening of the first metatarsal with a plantarflexed first ray.  I do not feel that there is any straightforward surgical intervention that could correct the forefoot and midfoot pain.  Will plan for a custom orthotic at [facility] with pressure relief from the first metatarsal to unload pressure from the fifth metatarsal and a metatarsal pad to unload pressure from the second metatarsal.  Will follow-up after the orthotic is obtained.  She will continue with a carbon fiber plate.  Follow-Up Instructions: Return if symptoms worsen or fail to improve.   Ortho Exam  Patient is alert, oriented, no adenopathy, well-dressed, normal affect, normal respiratory effort. Examination patient has a palpable pulse.  She states she does not have active dorsiflexion or plantarflexion of the great toe and complains of fixed clawing of the lesser toes.  She is maximally tender to palpation beneath the first and second metatarsal heads and the fifth metatarsal head.  She has a cavus foot with a plantarflexed first ray which seems to be overloading the lateral column and a long second metatarsal secondary to shortening of the first metatarsal which is also overloading  the second metatarsal.  She is radiographs that shows degenerative changes across the cuneiform joints.  There is callus beneath the second metatarsal head and callus beneath the fifth metatarsal head.  She has a supinated forefoot.  Imaging: No results found. No images are attached to the encounter.  Labs: Lab Results  Component Value Date   HGBA1C 7.6 (H) 12/06/2021   HGBA1C 9.0 (H) 10/02/2021   HGBA1C 7.2 (A) 02/03/2021   ESRSEDRATE 11 01/10/2022   CRP 0.6 01/10/2022   LABURIC 4.4 01/10/2022   REPTSTATUS 01/16/2022 FINAL 01/11/2022   GRAMSTAIN  07/23/2014    RARE WBC PRESENT,BOTH PMN AND MONONUCLEAR NO SQUAMOUS EPITHELIAL CELLS SEEN RARE GRAM POSITIVE COCCI IN PAIRS IN CLUSTERS Performed at [facility]  01/11/2022    NO GROWTH 5 DAYS Performed at [facility], [place] [address], [place], [place] [address]    LABORGA STAPHYLOCOCCUS AUREUS 07/23/2014     Lab Results  Component Value Date   ALBUMIN 3.2 (L) 01/17/2022   ALBUMIN 3.4 (L) 01/16/2022   ALBUMIN 3.6 01/15/2022    Lab Results  Component Value Date   MG 2.0 01/17/2022   MG 2.0 01/16/2022   MG 2.1 01/15/2022   Lab Results  Component Value Date   VD25OH 25.91 (L) 10/11/2020   VD25OH 18.50 (L) 09/16/2019   VD25OH  13 (L) 12/10/2013    No results found for: "PREALBUMIN"    Latest Ref Rng & Units 01/17/2022    3:22 AM 01/16/2022    3:55 AM 01/15/2022    3:43 AM  CBC EXTENDED  WBC 4.0 - 10.5 K/uL 4.5  4.8  4.9   RBC 3.87 - 5.11 MIL/uL 3.86  3.97  4.26   Hemoglobin 12.0 - 15.0 g/dL 75.1  02.5  85.2   HCT 36.0 - 46.0 % 32.9  33.3  36.3   Platelets 150 - 400 K/uL 211  236  261   NEUT# 1.7 - 7.7 K/uL 1.8  2.2  2.1   Lymph# 0.7 - 4.0 K/uL 2.0  1.9  2.1      There is no height or weight on file to calculate BMI.  Orders:  No orders of the defined types were placed in this encounter.  No orders of the defined types were placed in this encounter.    Procedures: No procedures  performed  Clinical Data: No additional findings.  ROS:  All other systems negative, except as noted in the HPI. Review of Systems  Objective: Vital Signs: There were no vitals taken for this visit.  Specialty Comments:  No specialty comments available.  PMFS History: Patient Active Problem List   Diagnosis Date Noted   Cellulitis of left foot 01/10/2022   Restless leg syndrome 08/08/2018   Macular degeneration 07/31/2018   Seborrheic dermatitis of scalp 05/13/2018   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   History of alcoholism (HCC) 04/28/2018   S/P cervical discectomy 04/28/2018   Eczema of both external ears 12/19/2017   Major depression, recurrent, chronic (HCC) 08/29/2017   Carpal tunnel syndrome on right 08/29/2016   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/2016   Dupuytren's contracture of left hand 12/21/2015   Uncontrolled IDDM-2 with hyperglycemia and neuropathy 03/09/2015   Memory loss 03/08/2015   Dysphagia, pharyngoesophageal phase 12/29/2014   Vitamin D deficiency 03/03/2014   Allergy    Hypertension associated with diabetes (HCC)    GERD (gastroesophageal reflux disease)    Mixed hyperlipidemia    Hypothyroidism    Past Medical History:  Diagnosis Date   Allergy    Anxiety 2003   Carpal tunnel syndrome, bilateral    Cough    for over a year per pt-   Depression 1998   Diabetes mellitus without complication (HCC) 1998   Dupuytren's contracture of left hand    DVT (deep venous thrombosis) (HCC) 2007   left calf x2   GERD (gastroesophageal reflux disease) 1980   History of alcoholism (HCC)    Chronic   HLD (hyperlipidemia)    Hyperlipidemia 1990   Hypertension    Hypothyroidism 1998   Macular degeneration 07/31/2018   2020   Macular degeneration 12/2019   PONV (postoperative nausea and vomiting)    1 time per pt in 2001   Short-term memory loss    SOB (shortness of breath) on exertion     Family History  Problem Relation Age of Onset   Depression Mother     Diabetes Mother    Hypertension Mother    Alcohol abuse Father    Arthritis Father    Cancer Father 29       Oral Cancer- had tongue, jaw resection--smoker and alcohol   Alcohol abuse Brother     Past Surgical History:  Procedure Laterality Date   ANTERIOR CERVICAL DISCECTOMY  02/2018   BACK SURGERY     x3 Lumbar   CARPAL  TUNNEL RELEASE Left 03/08/2016   Procedure: LEFT CARPAL TUNNEL RELEASE;  Surgeon: Leanora Cover, MD;  Location: [facility];  Service: Orthopedics;  Laterality: Left;   CATARACT EXTRACTION Bilateral 6/21-7/21   CERVICAL POLYPECTOMY N/A 08/17/2013   Procedure: CERVICAL POLYPECTOMY;  Surgeon: Sharene Butters, MD;  Location: [facility];  Service: Gynecology;  Laterality: N/A;   COLON SURGERY  2007   benign mass   Dupuytren's release Left 2017   ECTOPIC PREGNANCY SURGERY  1979   ESOPHAGEAL DILATION     x3   EYE SURGERY     FOOT ARTHRODESIS Left 12/21/2021   Procedure: ARTHRODESIS LIS [place];  Surgeon: Edrick Kins, DPM;  Location: [facility];  Service: Podiatry;  Laterality: Left;   HALLUX VALGUS LAPIDUS Left 12/21/2021   Procedure: HALLUX VALGUS LAPIDUS;  Surgeon: Edrick Kins, DPM;  Location: [facility];  Service: Podiatry;  Laterality: Left;   HYSTEROSCOPY WITH D & C N/A 08/17/2013   Procedure: DILATATION AND CURETTAGE /HYSTEROSCOPY;  Surgeon: Sharene Butters, MD;  Location: [facility];  Service: Gynecology;  Laterality: N/A;  YAG LASER   NECK SURGERY     c5-7 ACDF 3/15/has screws and plate in neck   TONSILLECTOMY     TRIGGER FINGER RELEASE Left 03/08/2016   Procedure: LEFT LONG TRIGGER RELEASE AND LEFT RING TRIGGER RELEASE;  Surgeon: Leanora Cover, MD;  Location: [facility];  Service: Orthopedics;  Laterality: Left;   UPPER GASTROINTESTINAL ENDOSCOPY     Social History   Occupational History   Occupation: Nurse  Tobacco Use   Smoking status: Never   Smokeless tobacco: Never  Vaping Use   Vaping Use: Never used  Substance and Sexual Activity    Alcohol use: Not Currently   Drug use: No   Sexual activity: Not Currently    Birth control/protection: Post-menopausal

## 2022-08-07 ENCOUNTER — Telehealth: Payer: Self-pay | Admitting: Orthopedic Surgery

## 2022-08-07 NOTE — Telephone Encounter (Signed)
I called the pt and advised that she could contact [facility] [phone] and see if they would be able to make the orthotics for her. To call with any other questions or concerns.

## 2022-08-07 NOTE — Telephone Encounter (Signed)
Patient called in stating the [facility] let her know today that they are no longer accepting patients with diabetes and she needs a new place to get her orthotics from please advise

## 2022-08-08 ENCOUNTER — Telehealth: Payer: Self-pay | Admitting: Orthopedic Surgery

## 2022-08-08 NOTE — Telephone Encounter (Signed)
Pt requesting refill of hydrocodone 5/325 last refilled on 07/27/2022 #30 please advise.

## 2022-08-08 NOTE — Telephone Encounter (Signed)
Patient requesting her pain medication refilled. Please advise.Marland Kitchen

## 2022-08-10 MED ORDER — HYDROCODONE-ACETAMINOPHEN 5-325 MG PO TABS: 1.0000 | ORAL_TABLET | Freq: Three times a day (TID) | ORAL | 0 refills | Status: DC | PRN

## 2022-08-10 NOTE — Addendum Note (Signed)
Addended by: Suzan Slick on: 08/10/2022 08:51 AM   Modules accepted: Orders

## 2022-08-13 ENCOUNTER — Other Ambulatory Visit: Payer: Self-pay

## 2022-08-13 ENCOUNTER — Other Ambulatory Visit: Payer: Self-pay | Admitting: Family Medicine

## 2022-08-13 MED ORDER — GABAPENTIN 600 MG PO TABS: 1200.0000 mg | ORAL_TABLET | Freq: Two times a day (BID) | ORAL | 0 refills | Status: DC

## 2022-08-20 DIAGNOSIS — G5601 Carpal tunnel syndrome, right upper limb: Secondary | ICD-10-CM | POA: Diagnosis not present

## 2022-08-20 DIAGNOSIS — M654 Radial styloid tenosynovitis [de Quervain]: Secondary | ICD-10-CM | POA: Diagnosis not present

## 2022-08-27 ENCOUNTER — Other Ambulatory Visit: Payer: Self-pay | Admitting: Family Medicine

## 2022-08-27 DIAGNOSIS — K219 Gastro-esophageal reflux disease without esophagitis: Secondary | ICD-10-CM

## 2022-08-31 ENCOUNTER — Other Ambulatory Visit: Payer: Self-pay | Admitting: Orthopedic Surgery

## 2022-08-31 ENCOUNTER — Telehealth: Payer: Self-pay | Admitting: Orthopedic Surgery

## 2022-08-31 MED ORDER — HYDROCODONE-ACETAMINOPHEN 5-325 MG PO TABS: 1.0000 | ORAL_TABLET | Freq: Three times a day (TID) | ORAL | 0 refills | Status: DC | PRN

## 2022-08-31 NOTE — Telephone Encounter (Signed)
I called patient and advised. 

## 2022-08-31 NOTE — Telephone Encounter (Signed)
Patient called needing Rx refilled Hydrocodone.  Patient advised she is out of her medication. Patient said she spoke with someone last week requesting refill. The number to contact patient is [phone]

## 2022-08-31 NOTE — Telephone Encounter (Signed)
Please advise 

## 2022-09-07 ENCOUNTER — Telehealth: Payer: Self-pay | Admitting: Orthopedic Surgery

## 2022-09-07 NOTE — Telephone Encounter (Signed)
Received vm from patient. She is checking on status of a fax from [employer] for Rx for inserts. Callback [phone]

## 2022-09-07 NOTE — Telephone Encounter (Signed)
Form is complete and pending Dr. Jess Barters signature on Monday.

## 2022-09-11 DIAGNOSIS — E1143 Type 2 diabetes mellitus with diabetic autonomic (poly)neuropathy: Secondary | ICD-10-CM | POA: Diagnosis not present

## 2022-09-11 DIAGNOSIS — K219 Gastro-esophageal reflux disease without esophagitis: Secondary | ICD-10-CM | POA: Diagnosis not present

## 2022-09-11 DIAGNOSIS — E782 Mixed hyperlipidemia: Secondary | ICD-10-CM | POA: Diagnosis not present

## 2022-09-11 DIAGNOSIS — R69 Illness, unspecified: Secondary | ICD-10-CM | POA: Diagnosis not present

## 2022-09-11 DIAGNOSIS — I10 Essential (primary) hypertension: Secondary | ICD-10-CM | POA: Diagnosis not present

## 2022-09-11 DIAGNOSIS — Z6830 Body mass index (BMI) 30.0-30.9, adult: Secondary | ICD-10-CM | POA: Diagnosis not present

## 2022-09-12 NOTE — Telephone Encounter (Signed)
Signed and sent to medical records yesterday

## 2022-09-14 ENCOUNTER — Encounter (INDEPENDENT_AMBULATORY_CARE_PROVIDER_SITE_OTHER): Payer: Self-pay | Admitting: *Deleted

## 2022-09-28 DIAGNOSIS — E1143 Type 2 diabetes mellitus with diabetic autonomic (poly)neuropathy: Secondary | ICD-10-CM | POA: Diagnosis not present

## 2022-09-28 DIAGNOSIS — F330 Major depressive disorder, recurrent, mild: Secondary | ICD-10-CM | POA: Diagnosis not present

## 2022-09-28 DIAGNOSIS — I10 Essential (primary) hypertension: Secondary | ICD-10-CM | POA: Diagnosis not present

## 2022-09-28 DIAGNOSIS — R69 Illness, unspecified: Secondary | ICD-10-CM | POA: Diagnosis not present

## 2022-09-28 DIAGNOSIS — E782 Mixed hyperlipidemia: Secondary | ICD-10-CM | POA: Diagnosis not present

## 2022-09-28 DIAGNOSIS — K219 Gastro-esophageal reflux disease without esophagitis: Secondary | ICD-10-CM | POA: Diagnosis not present

## 2022-09-29 LAB — LAB REPORT - SCANNED
A1c: 7.4
EGFR: 94

## 2022-10-09 ENCOUNTER — Telehealth: Payer: Self-pay | Admitting: Orthopedic Surgery

## 2022-10-09 ENCOUNTER — Other Ambulatory Visit: Payer: Self-pay | Admitting: Family Medicine

## 2022-10-09 NOTE — Telephone Encounter (Signed)
Patient request [facility] ref[facility] [place], please advise

## 2022-10-10 MED ORDER — HYDROCODONE-ACETAMINOPHEN 5-325 MG PO TABS: 1.0000 | ORAL_TABLET | Freq: Two times a day (BID) | ORAL | 0 refills | Status: DC | PRN

## 2022-10-10 NOTE — Telephone Encounter (Signed)
Pt last in office 08/06/2022. Last refill was 08/2022 hydrocodone. Pt was evaulated s/p CT scan left foot give rx for insert. Please advise.

## 2022-11-12 DIAGNOSIS — H524 Presbyopia: Secondary | ICD-10-CM | POA: Diagnosis not present

## 2022-11-12 DIAGNOSIS — E119 Type 2 diabetes mellitus without complications: Secondary | ICD-10-CM | POA: Diagnosis not present

## 2022-11-12 DIAGNOSIS — H26493 Other secondary cataract, bilateral: Secondary | ICD-10-CM | POA: Diagnosis not present

## 2022-11-12 DIAGNOSIS — H43813 Vitreous degeneration, bilateral: Secondary | ICD-10-CM | POA: Diagnosis not present

## 2022-11-12 DIAGNOSIS — H353131 Nonexudative age-related macular degeneration, bilateral, early dry stage: Secondary | ICD-10-CM | POA: Diagnosis not present

## 2022-11-12 DIAGNOSIS — H52203 Unspecified astigmatism, bilateral: Secondary | ICD-10-CM | POA: Diagnosis not present

## 2022-11-12 LAB — HM DIABETES EYE EXAM

## 2022-11-15 ENCOUNTER — Telehealth: Payer: Self-pay | Admitting: Orthopedic Surgery

## 2022-11-15 NOTE — Telephone Encounter (Signed)
Patient asking for a refill on her Hydrocodone 5/325mg 

## 2022-11-16 MED ORDER — HYDROCODONE-ACETAMINOPHEN 5-325 MG PO TABS: 1.0000 | ORAL_TABLET | Freq: Two times a day (BID) | ORAL | 0 refills | Status: DC | PRN

## 2022-11-16 NOTE — Addendum Note (Signed)
Addended by: Barnie Del R on: 11/16/2022 11:21 AM   Modules accepted: Orders

## 2022-11-23 DIAGNOSIS — E1142 Type 2 diabetes mellitus with diabetic polyneuropathy: Secondary | ICD-10-CM | POA: Diagnosis not present

## 2022-12-27 DIAGNOSIS — F330 Major depressive disorder, recurrent, mild: Secondary | ICD-10-CM | POA: Diagnosis not present

## 2022-12-27 DIAGNOSIS — K219 Gastro-esophageal reflux disease without esophagitis: Secondary | ICD-10-CM | POA: Diagnosis not present

## 2022-12-27 DIAGNOSIS — Z6833 Body mass index (BMI) 33.0-33.9, adult: Secondary | ICD-10-CM | POA: Diagnosis not present

## 2022-12-27 DIAGNOSIS — I10 Essential (primary) hypertension: Secondary | ICD-10-CM | POA: Diagnosis not present

## 2022-12-27 DIAGNOSIS — E782 Mixed hyperlipidemia: Secondary | ICD-10-CM | POA: Diagnosis not present

## 2022-12-27 DIAGNOSIS — Z0000 Encounter for general adult medical examination without abnormal findings: Secondary | ICD-10-CM | POA: Diagnosis not present

## 2022-12-27 DIAGNOSIS — E1143 Type 2 diabetes mellitus with diabetic autonomic (poly)neuropathy: Secondary | ICD-10-CM | POA: Diagnosis not present

## 2023-01-07 ENCOUNTER — Other Ambulatory Visit: Payer: Self-pay | Admitting: Orthopedic Surgery

## 2023-01-07 ENCOUNTER — Telehealth: Payer: Self-pay | Admitting: Orthopedic Surgery

## 2023-01-07 MED ORDER — HYDROCODONE-ACETAMINOPHEN 5-325 MG PO TABS: 1.0000 | ORAL_TABLET | Freq: Two times a day (BID) | ORAL | 0 refills | Status: DC | PRN

## 2023-01-07 NOTE — Telephone Encounter (Signed)
Pt called in stating she would like [facility] on [facility]

## 2023-01-07 NOTE — Telephone Encounter (Signed)
Pt asking for refill hydrocodone, last filled 11/16/22 #20  Given for pain of left nonunion bone after osteotomy

## 2023-01-08 ENCOUNTER — Encounter (INDEPENDENT_AMBULATORY_CARE_PROVIDER_SITE_OTHER): Payer: Self-pay | Admitting: *Deleted

## 2023-01-13 ENCOUNTER — Other Ambulatory Visit: Payer: Self-pay | Admitting: Family Medicine

## 2023-01-24 DIAGNOSIS — Z1231 Encounter for screening mammogram for malignant neoplasm of breast: Secondary | ICD-10-CM | POA: Diagnosis not present

## 2023-01-24 LAB — HM MAMMOGRAPHY

## 2023-02-14 ENCOUNTER — Telehealth (INDEPENDENT_AMBULATORY_CARE_PROVIDER_SITE_OTHER): Payer: Self-pay | Admitting: Gastroenterology

## 2023-02-14 NOTE — Telephone Encounter (Signed)
Who is your primary care physician: [facility]  Reasons for the colonoscopy: Recall  Have you had a colonoscopy before?  Yes 2000  Do you have family history of colon cancer? no  Previous colonoscopy with polyps removed? no  Do you have a history colorectal cancer?   no  Are you diabetic? If yes, Type 1 or Type 2?    Yes type 2  Do you have a prosthetic or mechanical heart valve? no  Do you have a pacemaker/defibrillator?   no  Have you had endocarditis/atrial fibrillation? no  Have you had joint replacement within the last 12 months?  no  Do you tend to be constipated or have to use laxatives? yes  Do you have any history of drugs or alchohol?  Yes alcohol recovering since 1997  Do you use supplemental oxygen?  no  Have you had a stroke or heart attack within the last 6 months? no  Do you take weight loss medication?  no  For female patients: have you had a hysterectomy?  no                                     are you post menopausal?       yes                                            do you still have your menstrual cycle? no      Do you take any blood-thinning medications such as: (aspirin, warfarin, Plavix, Aggrenox)  yes  If yes we need the name, milligram, dosage and who is prescribing doctor Aspirin 81 mg one per day Current Outpatient Medications on File Prior to Visit  Medication Sig Dispense Refill   Apoaequorin (PREVAGEN EXTRA STRENGTH) 20 MG CAPS Take 20 mg by mouth daily.     aspirin EC 81 MG tablet Take 81 mg by mouth daily.     Calcium Carbonate Antacid (TUMS PO) Take 1 tablet by mouth daily as needed (acid reflux).     citalopram (CELEXA) 20 MG tablet TAKE 1 TABLET BY MOUTH EVERY DAY 90 tablet 2   clonazePAM (KLONOPIN) 0.5 MG tablet TAKE 1 TABLET BY MOUTH TWICE A DAY AS NEEDED FOR ANXIETY 60 tablet 2   HYDROcodone-acetaminophen (NORCO/VICODIN) 5-325 MG tablet Take 1 tablet by mouth every 12 (twelve) hours as needed for moderate  pain. 20 tablet 0   iron polysaccharides (NIFEREX) 150 MG capsule Take 1 capsule (150 mg total) by mouth daily.     JARDIANCE 25 MG TABS tablet TAKE 1 TABLET (25 MG TOTAL) BY MOUTH DAILY. (Patient taking differently: Take 25 mg by mouth daily.) 30 tablet 20   levothyroxine (SYNTHROID) 100 MCG tablet TAKE 1 TABLET BY MOUTH DAILY BEFORE BREAKFAST. 90 tablet 3   lisinopril (PRINIVIL,ZESTRIL) 5 MG tablet Take 0.5 tablets (2.5 mg total) by mouth daily. 90 tablet 3   Melatonin 10 MG CAPS Take 20 mg by mouth at bedtime.     metFORMIN (GLUCOPHAGE) 1000 MG tablet Take 1 tablet (1,000 mg total) by mouth 2 (two) times daily with a meal. 180 tablet 3   OVER THE COUNTER MEDICATION Take 2 tablets by mouth daily. vision shield     pantoprazole (PROTONIX) 40 MG tablet TAKE 1 TABLET BY MOUTH  EVERY DAY 90 tablet 2   pravastatin (PRAVACHOL) 40 MG tablet TAKE 1 TABLET BY MOUTH EVERYDAY AT BEDTIME 90 tablet 2   senna-docusate (SENOKOT-S) 8.6-50 MG tablet Take 2 tablets by mouth 2 (two) times daily as needed for mild constipation.     TOUJEO SOLOSTAR 300 UNIT/ML Solostar Pen INJECT 45 UNITS INTO THE SKIN AT BEDTIME. (Patient taking differently: Inject 30-40 Units into the skin at bedtime.) 3 mL 3   vitamin B-12 1000 MCG tablet Take 1 tablet (1,000 mcg total) by mouth daily.     gabapentin (NEURONTIN) 600 MG tablet TAKE 2 TABLETS (1,200 MG TOTAL) BY MOUTH 2 (TWO) TIMES DAILY. 360 tablet 0   No current facility-administered medications on file prior to visit.    Allergies  Allergen Reactions   Toradol [Ketorolac Tromethamine] Other (See Comments)    Slurred speech, confusion   Doxycycline Nausea And Vomiting   Baclofen Other (See Comments)    Memory Loss and shakes   Morphine And Codeine Itching   Penicillins Rash    Has patient had a PCN reaction causing immediate rash, facial/tongue/throat swelling, SOB or lightheadedness with hypotension: Yes Has patient had a PCN reaction causing severe rash involving mucus  membranes or skin necrosis: No Has patient had a PCN reaction that required hospitalization: No Has patient had a PCN reaction occurring within the last 10 years: No If all of the above answers are "NO", then may proceed with Cephalosporin use.    Talwin [Pentazocine] Itching     [facility]  Primary Insurance Name: [employer]  Best number where you can be reached: [phone]

## 2023-02-20 NOTE — Telephone Encounter (Signed)
Left message to return call 

## 2023-03-29 NOTE — Telephone Encounter (Signed)
Left message to return call 

## 2023-04-03 ENCOUNTER — Other Ambulatory Visit: Payer: Self-pay | Admitting: Family Medicine

## 2023-04-10 DIAGNOSIS — E782 Mixed hyperlipidemia: Secondary | ICD-10-CM | POA: Diagnosis not present

## 2023-04-10 DIAGNOSIS — E039 Hypothyroidism, unspecified: Secondary | ICD-10-CM | POA: Diagnosis not present

## 2023-04-10 DIAGNOSIS — Z7689 Persons encountering health services in other specified circumstances: Secondary | ICD-10-CM | POA: Diagnosis not present

## 2023-04-10 DIAGNOSIS — K219 Gastro-esophageal reflux disease without esophagitis: Secondary | ICD-10-CM | POA: Diagnosis not present

## 2023-04-10 DIAGNOSIS — F330 Major depressive disorder, recurrent, mild: Secondary | ICD-10-CM | POA: Diagnosis not present

## 2023-04-10 DIAGNOSIS — I10 Essential (primary) hypertension: Secondary | ICD-10-CM | POA: Diagnosis not present

## 2023-04-10 DIAGNOSIS — Z6835 Body mass index (BMI) 35.0-35.9, adult: Secondary | ICD-10-CM | POA: Diagnosis not present

## 2023-04-10 DIAGNOSIS — E1143 Type 2 diabetes mellitus with diabetic autonomic (poly)neuropathy: Secondary | ICD-10-CM | POA: Diagnosis not present

## 2023-04-11 LAB — LAB REPORT - SCANNED
A1c: 9.9
EGFR: 88

## 2023-04-11 NOTE — Telephone Encounter (Signed)
LMOVM to call back. Letter mailed. ?

## 2023-04-15 LAB — TSH: TSH: 1.03 (ref 0.41–5.90)

## 2023-04-16 NOTE — Telephone Encounter (Signed)
Noted on referral.

## 2023-05-05 ENCOUNTER — Other Ambulatory Visit: Payer: Self-pay | Admitting: Family Medicine

## 2023-06-27 ENCOUNTER — Ambulatory Visit: Payer: Medicare HMO | Admitting: Family

## 2023-07-24 ENCOUNTER — Ambulatory Visit: Payer: Medicare HMO | Admitting: Family

## 2023-07-24 ENCOUNTER — Encounter: Payer: Self-pay | Admitting: Family

## 2023-07-24 VITALS — BP 139/83 | HR 84 | Temp 97.0°F | Ht 59.0 in | Wt 170.2 lb

## 2023-07-24 DIAGNOSIS — E119 Type 2 diabetes mellitus without complications: Secondary | ICD-10-CM | POA: Diagnosis not present

## 2023-07-24 DIAGNOSIS — M62838 Other muscle spasm: Secondary | ICD-10-CM

## 2023-07-24 DIAGNOSIS — H6691 Otitis media, unspecified, right ear: Secondary | ICD-10-CM

## 2023-07-24 DIAGNOSIS — J209 Acute bronchitis, unspecified: Secondary | ICD-10-CM

## 2023-07-24 DIAGNOSIS — Z7984 Long term (current) use of oral hypoglycemic drugs: Secondary | ICD-10-CM | POA: Diagnosis not present

## 2023-07-24 DIAGNOSIS — Z794 Long term (current) use of insulin: Secondary | ICD-10-CM | POA: Diagnosis not present

## 2023-07-24 MED ORDER — PREDNISONE 20 MG PO TABS: ORAL_TABLET | ORAL | 0 refills | Status: DC

## 2023-07-24 MED ORDER — AMOXICILLIN 500 MG PO CAPS: 1000.0000 mg | ORAL_CAPSULE | Freq: Three times a day (TID) | ORAL | 0 refills | Status: DC

## 2023-07-24 MED ORDER — ALBUTEROL SULFATE HFA 108 (90 BASE) MCG/ACT IN AERS: 2.0000 | INHALATION_SPRAY | Freq: Four times a day (QID) | RESPIRATORY_TRACT | 0 refills | Status: DC | PRN

## 2023-07-24 MED ORDER — CYCLOBENZAPRINE HCL 10 MG PO TABS: 10.0000 mg | ORAL_TABLET | Freq: Three times a day (TID) | ORAL | 0 refills | Status: DC | PRN

## 2023-07-24 MED ORDER — TOUJEO SOLOSTAR 300 UNIT/ML ~~LOC~~ SOPN: 40.0000 [IU] | PEN_INJECTOR | Freq: Every day | SUBCUTANEOUS | Status: DC

## 2023-07-24 NOTE — Patient Instructions (Signed)
 It was very nice to see you today!   I have sent over a steroid for your cough and antibiotic to treat your right ear. I also sent over an inhaler for your cough. Use this in am and 1 hour before bedtime, and during day 1 time while taking the medications. Drink at least 2 liters of water daily. Be sure to check your blood sugar - call us  if getting higher than 200 while on the prednisone . Ok to increase your Toujeo  insulin  by 2 units daily just while sugar is running higher. I also sent over a refill of your Flexeril .  Schedule a follow up appointment with Dr. Jodie when needed for other med refills.      PLEASE NOTE:  If you had any lab tests please let us  know if you have not heard back within a few days. You may see your results on MyChart before we have a chance to review them but we will give you a call once they are reviewed by us . If we ordered any referrals today, please let us  know if you have not heard from their office within the next week.

## 2023-07-24 NOTE — Progress Notes (Addendum)
 Patient ID: Marissa Thompson, female    DOB: 1950-10-03, 73 y.o.   MRN: [id number]  Chief Complaint  Patient presents with   new paitient   Cough    Pt c/o chest congestion, Dry cough, bilateral ear fullness and nasal congestion. Present since Sunday. Pt states she had bronchitis in the past.        Discussed the use of AI scribe software for clinical note transcription with the patient, who gave verbal consent to proceed.  History of Present Illness   The patient, with a history of diabetes, presents with a cough and ear pain that started over the past weekend. Initially, the symptoms were more of a head cold with ear pain. The cough has since progressed to bronchitis-like symptoms, becoming deeper and mostly dry. The patient reports a history of bronchitis and has previously been treated with inhalers, but has not had these symptoms in years. She also reports a sore throat and nasal drainage. The patient has a history of diabetes and is currently on metformin  and Toujeo  insulin . She also takes muscle relaxants for muscle spasms/pain.    Assessment & Plan:     Acute Bronchitis - Recent onset of cough, initially associated with upper respiratory symptoms, now deeper and productive of white sputum. No shortness of breath or chest tightness. Lungs with wheezing/rhonchi on exam. History of similar episodes in the past. -Start Prednisone  40mg  daily for 3 days, then taper to 20mg  daily for 4 days. -Advise to monitor blood sugars and increase Toujeo  by 2 units if fasting CBG >200 while taking the prednisone . -Consider over-the-counter generic Mucinex  bid to help with expectoration for 5-7d. -Encourage increased fluid intake, 2L qd. -Try humidifier for overnight sx relief. -RTO precautions provided.  Otitis Media - Right ear pain with examination consistent with infection, TM bulging w/erythema. -Include in treatment plan with AMOX 1g tid x 7d. Pt has old allergy to PCN injection for PNA 40  years ago caused a rash, does not remember if she has had to take AMOX or derivative since then.  -Advised to stop medication if rash starts and call the office.   -Schedule f/u visit with PCP for diabetes and have ear rechecked at that time.  Diabetes Mellitus - On Metformin  and Toujeo  insulin . Recent discontinuation of Ozempic due to gastrointestinal side effects and Jardiance  d/t frequent UTIs. -Continue current regimen. -Advise to increase Toujeo  to 45 units daily while on prednisone . -Check fasting blood sugars daily while on prednisone  and call if consistently >200.  Muscle Spasms - On cyclobenzaprine  10mg  as needed. -Refilling prescription.  -F/U with PCP     Subjective:    Outpatient Medications Prior to Visit  Medication Sig Dispense Refill   aspirin  EC 81 MG tablet Take 81 mg by mouth daily.     buPROPion  (WELLBUTRIN  SR) 200 MG 12 hr tablet Take 200 mg by mouth 2 (two) times daily.     clonazePAM  (KLONOPIN ) 0.5 MG tablet TAKE 1 TABLET BY MOUTH TWICE A DAY AS NEEDED FOR ANXIETY 60 tablet 2   cyclobenzaprine  (FLEXERIL ) 10 MG tablet Take 10 mg by mouth 3 (three) times daily as needed.     diphenhydrAMINE  HCl (BENADRYL  ALLERGY PO) Take by mouth.     FLUoxetine  (PROZAC ) 40 MG capsule Take 40 mg by mouth daily.     gabapentin  (NEURONTIN ) 600 MG tablet TAKE 2 TABLETS (1,200 MG TOTAL) BY MOUTH 2 (TWO) TIMES DAILY. 360 tablet 0   levothyroxine  (SYNTHROID ) 100 MCG tablet  TAKE 1 TABLET BY MOUTH DAILY BEFORE BREAKFAST. 90 tablet 3   lisinopril  (PRINIVIL ,ZESTRIL ) 5 MG tablet Take 0.5 tablets (2.5 mg total) by mouth daily. 90 tablet 3   Melatonin 10 MG CAPS Take 20 mg by mouth at bedtime.     metFORMIN  (GLUCOPHAGE ) 1000 MG tablet Take 1 tablet (1,000 mg total) by mouth 2 (two) times daily with a meal. 180 tablet 3   Omega-3 Fatty Acids (FISH OIL) 1000 MG CAPS Take by mouth.     OVER THE COUNTER MEDICATION Take 2 tablets by mouth daily. vision shield     OVER THE COUNTER MEDICATION  Centrum     pantoprazole  (PROTONIX ) 40 MG tablet TAKE 1 TABLET BY MOUTH EVERY DAY 90 tablet 2   pravastatin  (PRAVACHOL ) 40 MG tablet TAKE 1 TABLET BY MOUTH EVERYDAY AT BEDTIME 90 tablet 2   Apoaequorin (PREVAGEN EXTRA STRENGTH) 20 MG CAPS Take 20 mg by mouth daily. (Patient not taking: Reported on 07/24/2023)     Calcium Carbonate Antacid (TUMS PO) Take 1 tablet by mouth daily as needed (acid reflux). (Patient not taking: Reported on 07/24/2023)     citalopram  (CELEXA ) 20 MG tablet TAKE 1 TABLET BY MOUTH EVERY DAY (Patient not taking: Reported on 07/24/2023) 90 tablet 2   iron  polysaccharides (NIFEREX) 150 MG capsule Take 1 capsule (150 mg total) by mouth daily. (Patient not taking: Reported on 07/24/2023)     JARDIANCE  25 MG TABS tablet TAKE 1 TABLET (25 MG TOTAL) BY MOUTH DAILY. (Patient not taking: Reported on 07/24/2023) 30 tablet 20   senna-docusate (SENOKOT-S) 8.6-50 MG tablet Take 2 tablets by mouth 2 (two) times daily as needed for mild constipation. (Patient not taking: Reported on 07/24/2023)     TOUJEO  SOLOSTAR 300 UNIT/ML Solostar Pen INJECT 45 UNITS INTO THE SKIN AT BEDTIME. (Patient not taking: Reported on 07/24/2023) 3 mL 3   vitamin B-12 1000 MCG tablet Take 1 tablet (1,000 mcg total) by mouth daily. (Patient not taking: Reported on 07/24/2023)     HYDROcodone -acetaminophen  (NORCO/VICODIN) 5-325 MG tablet Take 1 tablet by mouth every 12 (twelve) hours as needed for moderate pain. (Patient not taking: Reported on 07/24/2023) 20 tablet 0   No facility-administered medications prior to visit.   Past Medical History:  Diagnosis Date   Allergy    Anxiety 2003   Carpal tunnel syndrome, bilateral    Cough    for over a year per pt-   Depression 1998   Diabetes mellitus without complication (HCC) 1998   Dupuytren's contracture of left hand    DVT (deep venous thrombosis) (HCC) 2007   left calf x2   GERD (gastroesophageal reflux disease) 1980   History of alcoholism (HCC)    Chronic   HLD  (hyperlipidemia)    Hyperlipidemia 1990   Hypertension    Hypothyroidism 1998   Macular degeneration 07/31/2018   2020   Macular degeneration 12/2019   PONV (postoperative nausea and vomiting)    1 time per pt in 2001   Short-term memory loss    SOB (shortness of breath) on exertion    Past Surgical History:  Procedure Laterality Date   ANTERIOR CERVICAL DISCECTOMY  02/2018   BACK SURGERY     x3 Lumbar   CARPAL TUNNEL RELEASE Left 03/08/2016   Procedure: LEFT CARPAL TUNNEL RELEASE;  Surgeon: Franky Curia, MD;  Location: [facility];  Service: Orthopedics;  Laterality: Left;   CATARACT EXTRACTION Bilateral 6/21-7/21   CERVICAL POLYPECTOMY N/A 08/17/2013  Procedure: CERVICAL POLYPECTOMY;  Surgeon: Charlie JONETTA Aho, MD;  Location: [facility];  Service: Gynecology;  Laterality: N/A;   COLON SURGERY  2007   benign mass   Dupuytren's release Left 2017   ECTOPIC PREGNANCY SURGERY  1979   ESOPHAGEAL DILATION     x3   EYE SURGERY     FOOT ARTHRODESIS Left 12/21/2021   Procedure: ARTHRODESIS LIS FRANC;  Surgeon: Janit Thresa HERO, DPM;  Location: [facility];  Service: Podiatry;  Laterality: Left;   HALLUX VALGUS LAPIDUS Left 12/21/2021   Procedure: HALLUX VALGUS LAPIDUS;  Surgeon: Janit Thresa HERO, DPM;  Location: [facility];  Service: Podiatry;  Laterality: Left;   HYSTEROSCOPY WITH D & C N/A 08/17/2013   Procedure: DILATATION AND CURETTAGE /HYSTEROSCOPY;  Surgeon: Charlie JONETTA Aho, MD;  Location: [facility];  Service: Gynecology;  Laterality: N/A;  YAG LASER   NECK SURGERY     c5-7 ACDF 3/15/has screws and plate in neck   TONSILLECTOMY     TRIGGER FINGER RELEASE Left 03/08/2016   Procedure: LEFT LONG TRIGGER RELEASE AND LEFT RING TRIGGER RELEASE;  Surgeon: Franky Curia, MD;  Location: [facility];  Service: Orthopedics;  Laterality: Left;   UPPER GASTROINTESTINAL ENDOSCOPY     Allergies  Allergen Reactions   Toradol  [Ketorolac  Tromethamine ] Other (See Comments)    Slurred  speech, confusion   Doxycycline  Nausea And Vomiting   Baclofen Other (See Comments)    Memory Loss and shakes   Morphine  And Codeine  Itching   Penicillins Rash    Has patient had a PCN reaction causing immediate rash, facial/tongue/throat swelling, SOB or lightheadedness with hypotension: Yes Has patient had a PCN reaction causing severe rash involving mucus membranes or skin necrosis: No Has patient had a PCN reaction that required hospitalization: No Has patient had a PCN reaction occurring within the last 10 years: No If all of the above answers are NO, then may proceed with Cephalosporin use.    Talwin [Pentazocine] Itching      Objective:    Physical Exam Vitals and nursing note reviewed.  Constitutional:      Appearance: Normal appearance. She is ill-appearing.     Interventions: Face mask in place.  HENT:     Right Ear: Ear canal normal. Tympanic membrane is erythematous and bulging.     Left Ear: Tympanic membrane and ear canal normal.     Nose:     Right Sinus: Frontal sinus tenderness present.     Left Sinus: Frontal sinus tenderness present.     Mouth/Throat:     Mouth: Mucous membranes are moist.     Pharynx: Posterior oropharyngeal erythema (mild) present. No pharyngeal swelling, oropharyngeal exudate or uvula swelling.     Tonsils: No tonsillar exudate or tonsillar abscesses.  Cardiovascular:     Rate and Rhythm: Normal rate and regular rhythm.  Pulmonary:     Effort: Pulmonary effort is normal.     Breath sounds: Examination of the right-upper field reveals rhonchi. Examination of the left-upper field reveals rhonchi. Examination of the right-middle field reveals rhonchi. Examination of the left-middle field reveals rhonchi. Examination of the right-lower field reveals rhonchi. Rhonchi present.  Musculoskeletal:        General: Normal range of motion.  Lymphadenopathy:     Head:     Right side of head: No preauricular or posterior auricular adenopathy.      Left side of head: No preauricular or posterior auricular adenopathy.     Cervical:  No cervical adenopathy.  Skin:    General: Skin is warm and dry.  Neurological:     Mental Status: She is alert.  Psychiatric:        Mood and Affect: Mood normal.        Behavior: Behavior normal.    BP 139/83 (BP Location: Left Arm, Patient Position: Sitting)   Pulse 84   Temp (!) 97 F (36.1 C) (Temporal)   Ht 4' 11 (1.499 m)   Wt 170 lb 3.2 oz (77.2 kg)   SpO2 96%   BMI 34.38 kg/m  Wt Readings from Last 3 Encounters:  07/24/23 170 lb 3.2 oz (77.2 kg)  05/10/22 159 lb (72.1 kg)  01/18/22 157 lb 3 oz (71.3 kg)       Lucius Krabbe, NP

## 2023-07-30 ENCOUNTER — Telehealth: Payer: Self-pay | Admitting: Family

## 2023-07-30 NOTE — Telephone Encounter (Signed)
 Copied from CRM [id number]. Topic: General - Other >> Jul 30, 2023  8:57 AM Eleanor C wrote: Reason for CRM: calling from [facility] calling to see if office received fax for patient. Called from number [phone], however caller stated callback number to let them know if fax was received was [phone]. Thank you    I returned call, unable to reach anyone. LVM in regards to message above. I have not received fax, Please have them re-fax at [phone] if anyone calls back please.  Thank you!

## 2023-08-01 ENCOUNTER — Ambulatory Visit (INDEPENDENT_AMBULATORY_CARE_PROVIDER_SITE_OTHER): Payer: Medicare HMO | Admitting: Family

## 2023-08-01 VITALS — BP 135/85 | HR 83 | Temp 98.0°F | Ht 59.0 in | Wt 171.5 lb

## 2023-08-01 DIAGNOSIS — J209 Acute bronchitis, unspecified: Secondary | ICD-10-CM | POA: Diagnosis not present

## 2023-08-01 MED ORDER — AZITHROMYCIN 250 MG PO TABS: ORAL_TABLET | ORAL | 0 refills | Status: AC

## 2023-08-01 MED ORDER — GUAIFENESIN ER 600 MG PO TB12: 600.0000 mg | ORAL_TABLET | Freq: Two times a day (BID) | ORAL | 0 refills | Status: DC

## 2023-08-01 NOTE — Progress Notes (Signed)
Patient ID: Marissa Thompson, female    DOB: 10/29/1950, 73 y.o.   MRN: [id number]  Chief Complaint  Patient presents with   cough    Pt c/o cough is still the same form 07/24/2023, along with right ear fullness. Unable to finish prednisone due to blood sugars being high.       Discussed the use of AI scribe software for clinical note transcription with the patient, who gave verbal consent to proceed.  History of Present Illness   The patient, with a history of diabetes, presents with persistent high blood sugar despite increasing doses of Toujeo. She reports multiple attempts to self-adjust her insulin dosage, but her blood sugar remained elevated, reaching levels as high as 345. She also experienced a drop to 96, indicating potential overcorrection. In addition to her diabetes management concerns, the patient is experiencing ongoing respiratory symptoms. She reports a persistent cough and chest congestion, despite completing a course of antibiotics and prednisone. She also reports ear fullness, though it has improved slightly. She has been using an albuterol inhaler, which has alleviated chest tightness but not shortness of breath. She also reports sinus congestion, which she has been managing with over-the-counter cold and flu medication.     Assessment & Plan:     Bronchitis -  Persistent cough and sinus congestion despite completion of Amoxicillin and intolerance to Prednisone. No wheezing noted on exam. Mucus is thick and yellow, congested cough noted during exam. -Start Zithromax (Z-Pak) to help with ear & chest symptoms. -Continue Albuterol inhaler 2-3x daily to help open lungs, allow easier breathing. -Start generic Mucinex to help thin mucus. -Use nasal saline spray tid and humidifier overnight to help symptoms. -Increase water intake to 2L daily. -RTO precautions provided.  Diabetes  - Elevated blood sugars despite increasing Toujeo. Patient reported taking multiple doses in one  night. -Continue Toujeo, but only take one dose per day. -Continue to monitor blood sugars and notify office if readings >200 or <80. -Encourage increased water intake to help thin mucus and manage blood sugars.     Subjective:    Outpatient Medications Prior to Visit  Medication Sig Dispense Refill   albuterol (VENTOLIN HFA) 108 (90 Base) MCG/ACT inhaler Inhale 2 puffs into the lungs every 6 (six) hours as needed for wheezing or shortness of breath (or cough). 8 g 0   aspirin EC 81 MG tablet Take 81 mg by mouth daily.     buPROPion (WELLBUTRIN SR) 200 MG 12 hr tablet Take 200 mg by mouth 2 (two) times daily.     clonazePAM (KLONOPIN) 0.5 MG tablet TAKE 1 TABLET BY MOUTH TWICE A DAY AS NEEDED FOR ANXIETY 60 tablet 2   cyclobenzaprine (FLEXERIL) 10 MG tablet Take 1 tablet (10 mg total) by mouth 3 (three) times daily as needed. 30 tablet 0   diphenhydrAMINE HCl (BENADRYL ALLERGY PO) Take by mouth.     FLUoxetine (PROZAC) 40 MG capsule Take 40 mg by mouth daily.     levothyroxine (SYNTHROID) 100 MCG tablet TAKE 1 TABLET BY MOUTH DAILY BEFORE BREAKFAST. 90 tablet 3   lisinopril (PRINIVIL,ZESTRIL) 5 MG tablet Take 0.5 tablets (2.5 mg total) by mouth daily. 90 tablet 3   Melatonin 10 MG CAPS Take 20 mg by mouth at bedtime.     metFORMIN (GLUCOPHAGE) 1000 MG tablet Take 1 tablet (1,000 mg total) by mouth 2 (two) times daily with a meal. 180 tablet 3   Omega-3 Fatty Acids (FISH OIL) 1000 MG  CAPS Take by mouth.     OVER THE COUNTER MEDICATION Take 2 tablets by mouth daily. vision shield     pantoprazole (PROTONIX) 40 MG tablet TAKE 1 TABLET BY MOUTH EVERY DAY 90 tablet 2   pravastatin (PRAVACHOL) 40 MG tablet TAKE 1 TABLET BY MOUTH EVERYDAY AT BEDTIME 90 tablet 2   TOUJEO SOLOSTAR 300 UNIT/ML Solostar Pen Inject 40 Units into the skin daily after breakfast.     gabapentin (NEURONTIN) 600 MG tablet TAKE 2 TABLETS (1,200 MG TOTAL) BY MOUTH 2 (TWO) TIMES DAILY. 360 tablet 0   predniSONE (DELTASONE) 20  MG tablet Take 2 pills in the morning with breakfast for 3 days, then 1 pill for 4 days (Patient not taking: Reported on 08/01/2023) 10 tablet 0   amoxicillin (AMOXIL) 500 MG capsule Take 2 capsules (1,000 mg total) by mouth 3 (three) times daily. (Patient not taking: Reported on 08/01/2023) 30 capsule 0   No facility-administered medications prior to visit.   Past Medical History:  Diagnosis Date   Allergy    Anxiety 2003   Carpal tunnel syndrome, bilateral    Cough    for over a year per pt-   Depression 1998   Diabetes mellitus without complication (HCC) 1998   Dupuytren's contracture of left hand    DVT (deep venous thrombosis) (HCC) 2007   left calf x2   GERD (gastroesophageal reflux disease) 1980   History of alcoholism (HCC)    Chronic   HLD (hyperlipidemia)    Hyperlipidemia 1990   Hypertension    Hypothyroidism 1998   Macular degeneration 07/31/2018   2020   Macular degeneration 12/2019   PONV (postoperative nausea and vomiting)    1 time per pt in 2001   Short-term memory loss    SOB (shortness of breath) on exertion    Past Surgical History:  Procedure Laterality Date   ANTERIOR CERVICAL DISCECTOMY  02/2018   BACK SURGERY     x3 Lumbar   CARPAL TUNNEL RELEASE Left 03/08/2016   Procedure: LEFT CARPAL TUNNEL RELEASE;  Surgeon: Betha Loa, MD;  Location: [facility];  Service: Orthopedics;  Laterality: Left;   CATARACT EXTRACTION Bilateral 6/21-7/21   CERVICAL POLYPECTOMY N/A 08/17/2013   Procedure: CERVICAL POLYPECTOMY;  Surgeon: Mickel Baas, MD;  Location: [facility];  Service: Gynecology;  Laterality: N/A;   COLON SURGERY  2007   benign mass   Dupuytren's release Left 2017   ECTOPIC PREGNANCY SURGERY  1979   ESOPHAGEAL DILATION     x3   EYE SURGERY     FOOT ARTHRODESIS Left 12/21/2021   Procedure: ARTHRODESIS LIS FRANC;  Surgeon: Felecia Shelling, DPM;  Location: [facility];  Service: Podiatry;  Laterality: Left;   HALLUX VALGUS LAPIDUS Left  12/21/2021   Procedure: HALLUX VALGUS LAPIDUS;  Surgeon: Felecia Shelling, DPM;  Location: [facility];  Service: Podiatry;  Laterality: Left;   HYSTEROSCOPY WITH D & C N/A 08/17/2013   Procedure: DILATATION AND CURETTAGE /HYSTEROSCOPY;  Surgeon: Mickel Baas, MD;  Location: [facility];  Service: Gynecology;  Laterality: N/A;  YAG LASER   NECK SURGERY     c5-7 ACDF 3/15/has screws and plate in neck   TONSILLECTOMY     TRIGGER FINGER RELEASE Left 03/08/2016   Procedure: LEFT LONG TRIGGER RELEASE AND LEFT RING TRIGGER RELEASE;  Surgeon: Betha Loa, MD;  Location: [facility];  Service: Orthopedics;  Laterality: Left;   UPPER GASTROINTESTINAL ENDOSCOPY  Allergies  Allergen Reactions   Toradol [Ketorolac Tromethamine] Other (See Comments)    Slurred speech, confusion   Doxycycline Nausea And Vomiting   Baclofen Other (See Comments)    Memory Loss and shakes   Morphine And Codeine Itching   Penicillins Rash    Has patient had a PCN reaction causing immediate rash, facial/tongue/throat swelling, SOB or lightheadedness with hypotension: Yes Has patient had a PCN reaction causing severe rash involving mucus membranes or skin necrosis: No Has patient had a PCN reaction that required hospitalization: No Has patient had a PCN reaction occurring within the last 10 years: No If all of the above answers are "NO", then may proceed with Cephalosporin use.    Talwin [Pentazocine] Itching      Objective:    Physical Exam Vitals and nursing note reviewed.  Constitutional:      Appearance: Normal appearance. She is ill-appearing.     Interventions: Face mask in place.  HENT:     Right Ear: Tympanic membrane and ear canal normal.     Left Ear: Tympanic membrane and ear canal normal.     Nose:     Right Sinus: Frontal sinus tenderness present.     Left Sinus: Frontal sinus tenderness present.     Mouth/Throat:     Mouth: Mucous membranes are moist.     Pharynx: Posterior oropharyngeal  erythema present. No pharyngeal swelling, oropharyngeal exudate or uvula swelling.     Tonsils: No tonsillar exudate or tonsillar abscesses.  Cardiovascular:     Rate and Rhythm: Normal rate and regular rhythm.  Pulmonary:     Effort: Pulmonary effort is normal.     Breath sounds: Normal breath sounds.  Musculoskeletal:        General: Normal range of motion.  Lymphadenopathy:     Head:     Right side of head: No preauricular or posterior auricular adenopathy.     Left side of head: No preauricular or posterior auricular adenopathy.     Cervical: No cervical adenopathy.  Skin:    General: Skin is warm and dry.  Neurological:     Mental Status: She is alert.  Psychiatric:        Mood and Affect: Mood normal.        Behavior: Behavior normal.    BP 135/85   Pulse 83   Temp 98 F (36.7 C) (Temporal)   Ht 4\' 11"  (1.499 m)   Wt 171 lb 8 oz (77.8 kg)   SpO2 95%   BMI 34.64 kg/m  Wt Readings from Last 3 Encounters:  08/01/23 171 lb 8 oz (77.8 kg)  07/24/23 170 lb 3.2 oz (77.2 kg)  05/10/22 159 lb (72.1 kg)      Dulce Sellar, NP

## 2023-08-01 NOTE — Patient Instructions (Addendum)
It was very nice to see you today!   YOUR PLAN:  UPPER RESPIRATORY INFECTION:  An upper respiratory infection is a condition that affects the nose, throat, and airways, often causing symptoms like cough, congestion, and mucus production. To address your persistent cough and sinus congestion, I am starting you on Zithromax (Z-Pak) to help with your ear symptoms. I have also sent Mucinex to thin the mucus, and recommending the use of nasal saline spray and a humidifier for sinus relief. Continue the Albuterol inhaler 2-3 times daily to open your lungs.  DIABETES MELLITUS:  Please monitor your blood sugars closely to avoid low blood sugar levels and increase your water intake to help manage both your mucus and blood sugar levels.  INSTRUCTIONS:  Please monitor your symptoms and blood sugars closely. If your symptoms persist or worsen, or if your blood sugars remain uncontrolled, return for further evaluation.   PLEASE NOTE:  If you had any lab tests please let us know if you have not heard back within a few days. You may see your results on MyChart before we have a chance to review them but we will give you a call once they are reviewed by [place]. If we ordered any referrals today, please let us know if you have not heard from their office within the next week.

## 2023-08-12 ENCOUNTER — Other Ambulatory Visit: Payer: Self-pay

## 2023-08-12 DIAGNOSIS — E119 Type 2 diabetes mellitus without complications: Secondary | ICD-10-CM

## 2023-08-12 MED ORDER — TOUJEO SOLOSTAR 300 UNIT/ML ~~LOC~~ SOPN: 40.0000 [IU] | PEN_INJECTOR | Freq: Every day | SUBCUTANEOUS | 0 refills | Status: DC

## 2023-08-16 ENCOUNTER — Other Ambulatory Visit: Payer: Self-pay | Admitting: Family

## 2023-08-16 DIAGNOSIS — J209 Acute bronchitis, unspecified: Secondary | ICD-10-CM

## 2023-08-21 ENCOUNTER — Ambulatory Visit: Payer: Medicare HMO | Admitting: Family Medicine

## 2023-08-21 ENCOUNTER — Other Ambulatory Visit: Payer: Self-pay | Admitting: Family Medicine

## 2023-08-21 DIAGNOSIS — E1149 Type 2 diabetes mellitus with other diabetic neurological complication: Secondary | ICD-10-CM

## 2023-08-28 ENCOUNTER — Telehealth: Payer: Self-pay

## 2023-08-28 ENCOUNTER — Ambulatory Visit: Payer: Medicare HMO | Admitting: Family Medicine

## 2023-08-28 NOTE — Telephone Encounter (Signed)
Forms have been received and placed on providers desk,Will fax once completed by PCP.

## 2023-08-28 NOTE — Telephone Encounter (Signed)
Copied from CRM [id number]. Topic: General - Other >> Aug 27, 2023  3:01 PM Theodis Sato wrote: Reason for CRM: Darwin from [employer] [facility] is re faxing a patient assistance application for  TOUJEO SOLOSTAR 300 UNIT/ML Solostar Pen   Awaiting forms.

## 2023-09-09 ENCOUNTER — Encounter: Payer: Self-pay | Admitting: Family Medicine

## 2023-09-09 ENCOUNTER — Ambulatory Visit (INDEPENDENT_AMBULATORY_CARE_PROVIDER_SITE_OTHER): Payer: Medicare HMO | Admitting: Family Medicine

## 2023-09-09 VITALS — BP 124/74 | HR 90 | Temp 97.7°F | Ht 59.0 in | Wt 173.0 lb

## 2023-09-09 DIAGNOSIS — E782 Mixed hyperlipidemia: Secondary | ICD-10-CM | POA: Diagnosis not present

## 2023-09-09 DIAGNOSIS — E1149 Type 2 diabetes mellitus with other diabetic neurological complication: Secondary | ICD-10-CM

## 2023-09-09 DIAGNOSIS — H3530 Unspecified macular degeneration: Secondary | ICD-10-CM

## 2023-09-09 DIAGNOSIS — Z794 Long term (current) use of insulin: Secondary | ICD-10-CM

## 2023-09-09 DIAGNOSIS — F339 Major depressive disorder, recurrent, unspecified: Secondary | ICD-10-CM

## 2023-09-09 DIAGNOSIS — F418 Other specified anxiety disorders: Secondary | ICD-10-CM | POA: Diagnosis not present

## 2023-09-09 DIAGNOSIS — R82998 Other abnormal findings in urine: Secondary | ICD-10-CM

## 2023-09-09 DIAGNOSIS — I152 Hypertension secondary to endocrine disorders: Secondary | ICD-10-CM

## 2023-09-09 DIAGNOSIS — E039 Hypothyroidism, unspecified: Secondary | ICD-10-CM | POA: Diagnosis not present

## 2023-09-09 DIAGNOSIS — G2581 Restless legs syndrome: Secondary | ICD-10-CM

## 2023-09-09 DIAGNOSIS — R3989 Other symptoms and signs involving the genitourinary system: Secondary | ICD-10-CM

## 2023-09-09 DIAGNOSIS — E1159 Type 2 diabetes mellitus with other circulatory complications: Secondary | ICD-10-CM

## 2023-09-09 DIAGNOSIS — E1165 Type 2 diabetes mellitus with hyperglycemia: Secondary | ICD-10-CM | POA: Diagnosis not present

## 2023-09-09 MED ORDER — FLUOXETINE HCL 20 MG PO TABS: 20.0000 mg | ORAL_TABLET | Freq: Every day | ORAL | 3 refills | Status: DC

## 2023-09-09 MED ORDER — LISINOPRIL 5 MG PO TABS: 5.0000 mg | ORAL_TABLET | Freq: Every day | ORAL | 3 refills | Status: DC

## 2023-09-09 MED ORDER — CLONAZEPAM 0.5 MG PO TABS
0.5000 mg | ORAL_TABLET | Freq: Two times a day (BID) | ORAL | 5 refills | Status: DC | PRN
Start: 2023-09-09 — End: 2024-01-13

## 2023-09-09 MED ORDER — GABAPENTIN 600 MG PO TABS: 600.0000 mg | ORAL_TABLET | Freq: Three times a day (TID) | ORAL | 3 refills | Status: DC

## 2023-09-09 NOTE — Progress Notes (Signed)
 Subjective  CC:  Chief Complaint  Patient presents with   transfer of care    Pt stated that she was advised by [facility] to make an appt with Mardelle Matte. Pt needs a script to get shingles vaccine    HPI: Marissa Thompson is a 73 y.o. female who presents to the office today for follow up of diabetes and problems listed above in the chief complaint.  Patient is here to reestablish care.  Last seen in May 2023.  She moved and sought care elsewhere but did not find this to be a good fit.  She would like to reestablish with me. Unfortunately she has multiple chronic medical problems that are uncontrolled including diabetes, hypertension.  She has hypothyroidism, hyperlipidemia and depression.  She sees an eye doctor for macular degeneration.  She is treated for restless leg symptoms and situational anxiety.  She request refills.  She says she has not had lab work in over 6 months.  She is not sure how things are going but she does check her sugars she is on long-term insulin. She has a history of noncompliance. She is now retired.  She lives about an hour away.  She says transportation is not an issue Chronic depression treated with Prozac.  She is now taking Prozac 40 mg daily and says she is quite apathetic.  Wt Readings from Last 3 Encounters:  09/09/23 173 lb (78.5 kg)  08/01/23 171 lb 8 oz (77.8 kg)  07/24/23 170 lb 3.2 oz (77.2 kg)    BP Readings from Last 3 Encounters:  09/09/23 124/74  08/01/23 135/85  07/24/23 139/83    Assessment  1. Uncontrolled IDDM-2 with hyperglycemia and neuropathy   2. Hypertension associated with diabetes (HCC)   3. Major depression, recurrent, chronic (HCC)   4. Hypothyroidism, unspecified type   5. Mixed hyperlipidemia   6. Macular degeneration, unspecified laterality, unspecified type   7. Restless leg syndrome   8. Type 2 diabetes mellitus with other neurologic complication, with long-term current use of insulin (HCC)   9. Situational anxiety   10.  Dark brown-colored urine   11. Dark yellow-colored urine   12. Insulin-requiring or dependent type II diabetes mellitus (HCC)      Plan  I had a long discussion about goals of care.  I recommend frequent follow-up to get things under control again.  I would require that she comes every 3 months given her history of missing visits and noncompliance.  We did discuss the possibilities of long-term complications if we can get her chronic medical conditions under control.  Patient agrees Today will check blood work to see what her levels are.  I want her back in 2 weeks to go over lab results and adjust medications. She will likely need blood pressure medication adjustment and possibly diabetic medication adjustment.  Although she reports her blood pressures at home are normal. I refilled gabapentin for her neuropathy today.  She says this is controlled. I recommend decreasing Prozac to 20 mg daily to treat decrease apathy and help with her depression.  Refilled her Klonopin which is a long-term medication for anxiety. Check urine, she says she has dark yellow/tea colored urine for the last year.  No other urinary symptoms present. Health maintenance: Patient says her mammogram is up-to-date, she has had a current eye exam.  Need to get these records.  She will sign a release of records for her prior PCP.  I spent a total of 44 minutes  for this patient encounter. Time spent included preparation, face-to-face counseling with the patient and coordination of care, review of chart and records, and documentation of the encounter.  Follow up: 2 weeks for recheck Orders Placed This Encounter  Procedures   CBC with Differential/Platelet   Comprehensive metabolic panel   Lipid panel   Hemoglobin A1c   TSH   Microalbumin / creatinine urine ratio   Urinalysis, Routine w reflex microscopic   Meds ordered this encounter  Medications   lisinopril (ZESTRIL) 5 MG tablet    Sig: Take 1 tablet (5 mg total) by  mouth daily.    Dispense:  90 tablet    Refill:  3   FLUoxetine (PROZAC) 20 MG tablet    Sig: Take 1 tablet (20 mg total) by mouth daily.    Dispense:  90 tablet    Refill:  3   gabapentin (NEURONTIN) 600 MG tablet    Sig: Take 1 tablet (600 mg total) by mouth 3 (three) times daily.    Dispense:  90 tablet    Refill:  3   clonazePAM (KLONOPIN) 0.5 MG tablet    Sig: Take 1 tablet (0.5 mg total) by mouth 2 (two) times daily as needed for anxiety.    Dispense:  60 tablet    Refill:  5    This request is for a new prescription for a controlled substance as required by Federal/State law.      Immunization History  Administered Date(s) Administered   Fluad Quad(high Dose 65+) 03/05/2019, 04/04/2020   Fluad Trivalent(High Dose 65+) 03/31/2023   Influenza, High Dose Seasonal PF 08/29/2017, 04/03/2018   Influenza-Unspecified 03/16/2013   PFIZER(Purple Top)SARS-COV-2 Vaccination 08/08/2019, 09/05/2019, 05/09/2020, 02/16/2021   PPD Test 02/08/2014, 03/09/2015, 10/24/2015, 06/19/2016, 09/28/2019   Pneumococcal Conjugate-13 08/29/2017   Pneumococcal Polysaccharide-23 07/17/2011, 07/31/2018   Td 07/17/2003   Tdap 12/10/2013    Diabetes Related Lab Review: Lab Results  Component Value Date   HGBA1C 7.6 (H) 12/06/2021   HGBA1C 9.0 (H) 10/02/2021   HGBA1C 7.2 (A) 02/03/2021    Lab Results  Component Value Date   MICROALBUR <0.7 12/06/2021   Lab Results  Component Value Date   CREATININE 0.36 (L) 01/17/2022   BUN 9 01/17/2022   NA 138 01/17/2022   K 4.1 01/17/2022   CL 105 01/17/2022   CO2 26 01/17/2022   Lab Results  Component Value Date   CHOL 142 10/02/2021   CHOL 161 10/11/2020   CHOL 177 09/16/2019   Lab Results  Component Value Date   HDL 70.80 10/02/2021   HDL 66.90 10/11/2020   HDL 83.90 09/16/2019   Lab Results  Component Value Date   LDLCALC 35 10/02/2021   LDLCALC 62 09/16/2019   LDLCALC 46 07/31/2018   Lab Results  Component Value Date   TRIG 179.0  (H) 10/02/2021   TRIG 253.0 (H) 10/11/2020   TRIG 155.0 (H) 09/16/2019   Lab Results  Component Value Date   CHOLHDL 2 10/02/2021   CHOLHDL 2 10/11/2020   CHOLHDL 2 09/16/2019   Lab Results  Component Value Date   LDLDIRECT 61.0 10/11/2020   The 10-year ASCVD risk score (Arnett DK, et al., 2019) is: 21.4%   Values used to calculate the score:     Age: 50 years     Sex: Female     Is Non-Hispanic African American: No     Diabetic: Yes     Tobacco smoker: No     Systolic Blood  Pressure: 124 mmHg     Is BP treated: Yes     HDL Cholesterol: 70.8 mg/dL     Total Cholesterol: 142 mg/dL I have reviewed the PMH, Fam and Soc history. Patient Active Problem List   Diagnosis Date Noted Date Diagnosed   Major depression, recurrent, chronic (HCC) 08/29/2017     Priority: High    Had been on zoloft for years: changed to paxil 2018 with good results. 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/2016     Priority: Medium    Dupuytren's contracture of left hand 12/21/2015     Priority: Medium    Dysphagia, pharyngoesophageal phase 12/29/2014     Priority: Medium     Nl EGD 2019    GERD (gastroesophageal reflux disease)      Priority: Medium    Seborrheic dermatitis of scalp 05/13/2018     Priority: Low   Eczema of both external ears 12/19/2017     Priority: Low   Vitamin D deficiency 03/03/2014     Priority: Low   Allergy       Priority: Low   Cellulitis of left foot 01/10/2022    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    History of alcoholism (HCC) 04/28/2018     Social History: Patient  reports that she has never smoked. She has never used smokeless tobacco. She reports that she does not currently use alcohol. She reports that she does not use drugs.  Review of Systems: Ophthalmic: negative for eye pain, loss of vision or double vision Cardiovascular: negative for chest pain Respiratory: negative for SOB or persistent cough Gastrointestinal: negative for abdominal pain Genitourinary: negative for dysuria or gross hematuria MSK: negative for foot lesions Neurologic: negative for weakness or gait disturbance  Objective  Vitals: BP 124/74 Comment: Patient reported home readings  Pulse 90   Temp 97.7 F (36.5 C)   Ht 4\' 11"  (1.499 m)   Wt 173 lb (78.5 kg)   SpO2 94%   BMI 34.94 kg/m  General: well appearing, no acute distress  Psych:  Alert and oriented, normal mood and affect HEENT:  Normocephalic, atraumatic, moist mucous membranes, supple neck  Cardiovascular:  Nl S1 and S2, RRR without murmur, gallop or rub. no edema Respiratory:  Good breath sounds bilaterally, CTAB with normal effort, no rales Extremities without edema Diabetic education: ongoing education regarding chronic disease management for diabetes was given today. We continue to reinforce the ABC's of diabetic management: A1c (<7 or 8 dependent upon patient), tight blood pressure control, and cholesterol management with goal LDL < 100 minimally. We discuss diet strategies, exercise recommendations, medication options and possible side effects. At each visit, we review recommended immunizations and preventive care recommendations for diabetics and stress that good diabetic control can prevent other problems.  See below for this patient's data.   Commons side effects, risks, benefits, and alternatives for medications and treatment plan prescribed today  were discussed, and the patient expressed understanding of the given instructions. Patient is instructed to call or message via MyChart if he/she has any questions or concerns regarding our treatment plan. No barriers to understanding were identified. We discussed Red Flag symptoms and signs in detail. Patient expressed understanding regarding what to do in case of urgent or emergency type symptoms.  Medication list was reconciled, printed and provided to the patient in AVS. Patient instructions and summary information was reviewed with the patient as documented in the AVS. This note was prepared with assistance of Dragon voice recognition software. Occasional wrong-word or sound-a-like substitutions may have occurred due to the inherent limitations of voice recognition software

## 2023-09-10 LAB — LIPID PANEL
Cholesterol: 149 mg/dL (ref 0–200)
HDL: 82.4 mg/dL (ref >39.00)
LDL Cholesterol: 40 mg/dL (ref 0–99)
NonHDL: 66.66
Total CHOL/HDL Ratio: 2
Triglycerides: 134 mg/dL (ref 0.0–149.0)
VLDL: 26.8 mg/dL (ref 0.0–40.0)

## 2023-09-10 LAB — COMPREHENSIVE METABOLIC PANEL
ALT: 20 U/L (ref 0–35)
AST: 27 U/L (ref 0–37)
Albumin: 4.3 g/dL (ref 3.5–5.2)
Alkaline Phosphatase: 104 U/L (ref 39–117)
BUN: 12 mg/dL (ref 6–23)
CO2: 28 meq/L (ref 19–32)
Calcium: 9.6 mg/dL (ref 8.4–10.5)
Chloride: 98 meq/L (ref 96–112)
Creatinine, Ser: 0.72 mg/dL (ref 0.40–1.20)
GFR: 84.14 mL/min (ref >60.00)
Glucose, Bld: 109 mg/dL — ABNORMAL HIGH (ref 70–99)
Potassium: 4.2 meq/L (ref 3.5–5.1)
Sodium: 137 meq/L (ref 135–145)
Total Bilirubin: 0.4 mg/dL (ref 0.2–1.2)
Total Protein: 7.3 g/dL (ref 6.0–8.3)

## 2023-09-10 LAB — CBC WITH DIFFERENTIAL/PLATELET
Basophils Absolute: 0.1 10*3/uL (ref 0.0–0.1)
Basophils Relative: 1 % (ref 0.0–3.0)
Eosinophils Absolute: 0.1 10*3/uL (ref 0.0–0.7)
Eosinophils Relative: 0.9 % (ref 0.0–5.0)
HCT: 39.6 % (ref 36.0–46.0)
Hemoglobin: 12.7 g/dL (ref 12.0–15.0)
Lymphocytes Relative: 27.2 % (ref 12.0–46.0)
Lymphs Abs: 1.9 10*3/uL (ref 0.7–4.0)
MCHC: 32 g/dL (ref 30.0–36.0)
MCV: 82.6 fL (ref 78.0–100.0)
Monocytes Absolute: 0.5 10*3/uL (ref 0.1–1.0)
Monocytes Relative: 7.8 % (ref 3.0–12.0)
Neutro Abs: 4.4 10*3/uL (ref 1.4–7.7)
Neutrophils Relative %: 63.1 % (ref 43.0–77.0)
Platelets: 276 10*3/uL (ref 150.0–400.0)
RBC: 4.79 Mil/uL (ref 3.87–5.11)
RDW: 16.7 % — ABNORMAL HIGH (ref 11.5–15.5)
WBC: 7 10*3/uL (ref 4.0–10.5)

## 2023-09-10 LAB — MICROALBUMIN / CREATININE URINE RATIO
Creatinine,U: 27.8 mg/dL
Microalb Creat Ratio: 25.2 mg/g (ref 0.0–30.0)
Microalb, Ur: <0.7 mg/dL (ref 0.0–1.9)

## 2023-09-10 LAB — TSH: TSH: 0.25 u[IU]/mL — ABNORMAL LOW (ref 0.35–5.50)

## 2023-09-10 LAB — HEMOGLOBIN A1C: Hgb A1c MFr Bld: 7.8 % — ABNORMAL HIGH (ref 4.6–6.5)

## 2023-09-23 ENCOUNTER — Ambulatory Visit: Payer: Medicare HMO | Admitting: Family Medicine

## 2023-09-25 ENCOUNTER — Encounter: Payer: Self-pay | Admitting: Family Medicine

## 2023-09-25 ENCOUNTER — Ambulatory Visit: Admitting: Family Medicine

## 2023-09-25 MED ORDER — LEVOTHYROXINE SODIUM 88 MCG PO TABS: 88.0000 ug | ORAL_TABLET | Freq: Every day | ORAL | 3 refills | Status: AC

## 2023-09-25 NOTE — Addendum Note (Signed)
 Addended by: Asencion Partridge on: 09/25/2023 08:43 AM   Modules accepted: Orders

## 2023-09-25 NOTE — Progress Notes (Signed)
 See mychart note decrease levotx from 100 to 22 Dear Marissa Thompson, I am sorry we missed our follow up appointments.  Your diabetic control is fair but we need to adjust up your medication to get your control improved. And your thyroid is over treated so I need to lower the dose of your levothyroxine. Your cholesterol is excellent.  I have ordered a lower dose of your levothyroxine to take daily on an empty stomach. At your follow up visit, we will discuss adjusting your diabetic medications.  See you soon. Sincerely, Dr. Mardelle Matte

## 2023-09-29 ENCOUNTER — Other Ambulatory Visit: Payer: Self-pay | Admitting: Family Medicine

## 2023-09-29 DIAGNOSIS — K219 Gastro-esophageal reflux disease without esophagitis: Secondary | ICD-10-CM

## 2023-10-02 ENCOUNTER — Telehealth: Payer: Self-pay | Admitting: Family Medicine

## 2023-10-02 ENCOUNTER — Ambulatory Visit: Admitting: Family Medicine

## 2023-10-02 NOTE — Telephone Encounter (Signed)
 Provider is requesting to contact patient & "let her know that if she is unable to make her next appointment I will not be able to continue care with her as we discussed. thanks!"

## 2023-10-09 ENCOUNTER — Encounter: Payer: Self-pay | Admitting: Family Medicine

## 2023-10-09 ENCOUNTER — Ambulatory Visit (INDEPENDENT_AMBULATORY_CARE_PROVIDER_SITE_OTHER): Admitting: Family Medicine

## 2023-10-09 VITALS — BP 149/82 | HR 85 | Temp 97.7°F | Ht 59.0 in | Wt 172.2 lb

## 2023-10-09 DIAGNOSIS — E1159 Type 2 diabetes mellitus with other circulatory complications: Secondary | ICD-10-CM | POA: Diagnosis not present

## 2023-10-09 DIAGNOSIS — I152 Hypertension secondary to endocrine disorders: Secondary | ICD-10-CM | POA: Diagnosis not present

## 2023-10-09 DIAGNOSIS — Z1231 Encounter for screening mammogram for malignant neoplasm of breast: Secondary | ICD-10-CM

## 2023-10-09 DIAGNOSIS — R3989 Other symptoms and signs involving the genitourinary system: Secondary | ICD-10-CM

## 2023-10-09 DIAGNOSIS — Z780 Asymptomatic menopausal state: Secondary | ICD-10-CM | POA: Diagnosis not present

## 2023-10-09 DIAGNOSIS — E119 Type 2 diabetes mellitus without complications: Secondary | ICD-10-CM

## 2023-10-09 DIAGNOSIS — E1165 Type 2 diabetes mellitus with hyperglycemia: Secondary | ICD-10-CM | POA: Diagnosis not present

## 2023-10-09 DIAGNOSIS — Z794 Long term (current) use of insulin: Secondary | ICD-10-CM

## 2023-10-09 MED ORDER — EMPAGLIFLOZIN 25 MG PO TABS: 25.0000 mg | ORAL_TABLET | Freq: Every day | ORAL | 11 refills | Status: DC

## 2023-10-09 MED ORDER — TRAZODONE HCL 50 MG PO TABS
25.0000 mg | ORAL_TABLET | Freq: Every evening | ORAL | 3 refills | Status: DC | PRN
Start: 2023-10-09 — End: 2023-11-01

## 2023-10-09 MED ORDER — LISINOPRIL 10 MG PO TABS: 5.0000 mg | ORAL_TABLET | Freq: Every day | ORAL | 3 refills | Status: DC

## 2023-10-09 MED ORDER — EMPAGLIFLOZIN 25 MG PO TABS
25.0000 mg | ORAL_TABLET | Freq: Every day | ORAL | 11 refills | Status: DC
Start: 2023-10-09 — End: 2023-10-09

## 2023-10-09 NOTE — Progress Notes (Signed)
 Subjective  CC:  Chief Complaint  Patient presents with   Follow-up   Diabetes   Hypothyroidism   Hyperlipidemia    HPI: Marissa Thompson is a 73 y.o. female who presents to the office today for follow up of diabetes and problems listed above in the chief complaint.  Discussed the use of AI scribe software for clinical note transcription with the patient, who gave verbal consent to proceed.  History of Present Illness   Marissa Thompson is a 73 year old female with diabetes who presents for management of her blood sugar levels.  She manages her diabetes with metformin and Toujeo, taking 40 units of insulin. Her most recent A1c was 7.8, showing improvement but still above the target range. Morning blood sugar readings are typically around 150 to 160 mg/dL. She has a history of using Jardiance, which previously caused urinary tract infections, but she has not experienced any recent infections since discontinuing it three years ago when her A1c was around 9 or 10.  Hypertension is managed with lisinopril 5 mg, which was increased from 2.5 mg a few months ago. Home blood pressure readings have been around 120/80 mmHg, although it was previously as high as 194/100 mmHg due to a kinked blood pressure cuff. Blood pressure today was noted to be 142/88 mmHg.  She experiences chronic sleep difficulties, having trouble falling asleep and staying asleep. She previously used Ambien but discontinued it years ago. Currently, she uses Tylenol, Benadryl, magnesium, and melatonin to aid sleep. Gabapentin 200 mg twice daily is taken for back pain and neuropathy, but it does not help her sleep. She sometimes takes additional doses at night to aid sleep.  She has a history of foot surgery where a metal plate was inserted, but the bone has since come out from the plate. Further surgery could result in multiple procedures and a risk of losing her foot if not healed properly.  + Hypothyroidism with low TSH  checked last month: She is on a lower dose of thyroid medication, which she has not yet picked up, and is currently breaking her pills to achieve the lower dose.  We decreased her Prozac from 40-20 last visit.  She feels that she is improved.  Less apathetic.  Still with some low mood.  No other adverse effects at this time. Wt Readings from Last 3 Encounters:  10/09/23 172 lb 3.2 oz (78.1 kg)  09/09/23 173 lb (78.5 kg)  08/01/23 171 lb 8 oz (77.8 kg)    BP Readings from Last 3 Encounters:  10/09/23 (!) 149/82  09/09/23 124/74  08/01/23 135/85    Assessment  1. Uncontrolled IDDM-2 with hyperglycemia and neuropathy   2. Insulin-requiring or dependent type II diabetes mellitus (HCC)   3. Hypertension associated with diabetes (HCC)   4. Dark yellow-colored urine   5. Screening mammogram for breast cancer   6. Asymptomatic menopausal state      Plan  Assessment and Plan    Type 2 Diabetes Mellitus A1c improved to 7.8 but remains above target. Jardiance recommended for further A1c reduction and renal protection. - Restart Jardiance if insurance covers it. - Continue metformin and Toujeo. - Monitor blood glucose levels regularly.  Hypertension Blood pressure elevated at 142/88 mmHg. Lisinopril to be increased for better control. - Increase lisinopril to 10 mg daily. - Bring home blood pressure cuff to next appointment for comparison.  Hypothyroidism Thyroid levels slightly off. Emphasized correct dosing of thyroid medication. - Pick up  and start the correct lower dose of thyroid medication.  Insomnia Long-standing insomnia. Gabapentin adjustment recommended, trazodone considered if needed. - Increase gabapentin to 200 mg in the morning and 400 mg at night. - Consider trazodone if gabapentin adjustment is insufficient.  Foot Surgery Complications Advised against further surgery due to risk of losing foot. Good pulse, no immediate concerns. - Monitor foot condition and report  any changes or concerns.  General Health Maintenance Due for a mammogram. Discussed scheduling options. - Order mammogram. - Schedule mammogram at the breast center.     Follow up: 3 months to recheck Orders Placed This Encounter  Procedures   MM DIGITAL SCREENING BILATERAL   DG Bone Density   Urinalysis, Routine w reflex microscopic   Meds ordered this encounter  Medications   DISCONTD: lisinopril (ZESTRIL) 10 MG tablet    Sig: Take 0.5 tablets (5 mg total) by mouth daily.    Dispense:  90 tablet    Refill:  3   DISCONTD: empagliflozin (JARDIANCE) 25 MG TABS tablet    Sig: Take 1 tablet (25 mg total) by mouth daily before breakfast.    Dispense:  30 tablet    Refill:  11   lisinopril (ZESTRIL) 10 MG tablet    Sig: Take 0.5 tablets (5 mg total) by mouth daily.    Dispense:  90 tablet    Refill:  3   empagliflozin (JARDIANCE) 25 MG TABS tablet    Sig: Take 1 tablet (25 mg total) by mouth daily before breakfast.    Dispense:  30 tablet    Refill:  11      Immunization History  Administered Date(s) Administered   Fluad Quad(high Dose 65+) 03/05/2019, 04/04/2020   Fluad Trivalent(High Dose 65+) 03/31/2023   Influenza, High Dose Seasonal PF 08/29/2017, 04/03/2018   Influenza-Unspecified 03/16/2013   PFIZER(Purple Top)SARS-COV-2 Vaccination 08/08/2019, 09/05/2019, 05/09/2020, 02/16/2021   PPD Test 02/08/2014, 03/09/2015, 10/24/2015, 06/19/2016, 09/28/2019   Pneumococcal Conjugate-13 08/29/2017   Pneumococcal Polysaccharide-23 07/17/2011, 07/31/2018   Td 07/17/2003   Tdap 12/10/2013    Diabetes Related Lab Review: Lab Results  Component Value Date   HGBA1C 7.8 (H) 09/09/2023   HGBA1C 7.6 (H) 12/06/2021   HGBA1C 9.0 (H) 10/02/2021    Lab Results  Component Value Date   MICROALBUR <0.7 09/09/2023   Lab Results  Component Value Date   CREATININE 0.72 09/09/2023   BUN 12 09/09/2023   NA 137 09/09/2023   K 4.2 09/09/2023   CL 98 09/09/2023   CO2 28 09/09/2023    Lab Results  Component Value Date   CHOL 149 09/09/2023   CHOL 142 10/02/2021   CHOL 161 10/11/2020   Lab Results  Component Value Date   HDL 82.40 09/09/2023   HDL 70.80 10/02/2021   HDL 66.90 10/11/2020   Lab Results  Component Value Date   LDLCALC 40 09/09/2023   LDLCALC 35 10/02/2021   LDLCALC 62 09/16/2019   Lab Results  Component Value Date   TRIG 134.0 09/09/2023   TRIG 179.0 (H) 10/02/2021   TRIG 253.0 (H) 10/11/2020   Lab Results  Component Value Date   CHOLHDL 2 09/09/2023   CHOLHDL 2 10/02/2021   CHOLHDL 2 10/11/2020   Lab Results  Component Value Date   LDLDIRECT 61.0 10/11/2020   The 10-year ASCVD risk score (Arnett DK, et al., 2019) is: 29.1%   Values used to calculate the score:     Age: 59 years     Sex: Female  Is Non-Hispanic African American: No     Diabetic: Yes     Tobacco smoker: No     Systolic Blood Pressure: 149 mmHg     Is BP treated: Yes     HDL Cholesterol: 82.4 mg/dL     Total Cholesterol: 149 mg/dL I have reviewed the PMH, Fam and Soc history. Patient Active Problem List   Diagnosis Date Noted Date Diagnosed   Major depression, recurrent, chronic (HCC) 08/29/2017     Priority: High    Had been on zoloft for years: changed to paxil 2018 with good results. 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/2016     Priority: Medium    Dupuytren's contracture of left hand  12/21/2015     Priority: Medium    Dysphagia, pharyngoesophageal phase 12/29/2014     Priority: Medium     Nl EGD 2019    GERD (gastroesophageal reflux disease)      Priority: Medium    Seborrheic dermatitis of scalp 05/13/2018     Priority: Low   Eczema of both external ears 12/19/2017     Priority: Low   Vitamin D deficiency 03/03/2014     Priority: Low   Allergy      Priority: Low   Cellulitis of left foot 01/10/2022    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    History of alcoholism (HCC) 04/28/2018     Social History: Patient  reports that she has never smoked. She has never used smokeless tobacco. She reports that she does not currently use alcohol. She reports that she does not use drugs.  Review of Systems: Ophthalmic: negative for eye pain, loss of vision or double vision Cardiovascular: negative for chest pain Respiratory: negative for SOB or persistent cough Gastrointestinal: negative for abdominal pain Genitourinary: negative for dysuria or gross hematuria MSK: negative for foot lesions Neurologic: negative for weakness or gait disturbance  Objective  Vitals: BP (!) 149/82   Pulse 85   Temp 97.7 F (36.5 C)   Ht 4\' 11"  (1.499 m)   Wt 172 lb 3.2 oz (78.1 kg)   SpO2 95%   BMI 34.78 kg/m  General: well appearing, no acute distress  Psych:  Alert and oriented, normal mood and affect HEENT:  Normocephalic, atraumatic, moist mucous membranes, supple neck  Cardiovascular:  Nl S1 and S2, RRR without murmur, gallop or rub. no edema Respiratory:  Good breath sounds bilaterally, CTAB with normal effort, no rales Gastrointestinal: normal BS, soft, nontender Skin:  Warm, no rashes Neurologic:   Mental status is normal. normal gait Foot exam: no erythema, pallor, or cyanosis visible nl proprioception and sensation to monofilament testing bilaterally, +2 distal pulses bilaterally  Diabetic education: ongoing education regarding chronic disease management for diabetes was given  today. We continue to  reinforce the ABC's of diabetic management: A1c (<7 or 8 dependent upon patient), tight blood pressure control, and cholesterol management with goal LDL < 100 minimally. We discuss diet strategies, exercise recommendations, medication options and possible side effects. At each visit, we review recommended immunizations and preventive care recommendations for diabetics and stress that good diabetic control can prevent other problems. See below for this patient's data.   Commons side effects, risks, benefits, and alternatives for medications and treatment plan prescribed today were discussed, and the patient expressed understanding of the given instructions. Patient is instructed to call or message via MyChart if he/she has any questions or concerns regarding our treatment plan. No barriers to understanding were identified. We discussed Red Flag symptoms and signs in detail. Patient expressed understanding regarding what to do in case of urgent or emergency type symptoms.  Medication list was reconciled, printed and provided to the patient in AVS. Patient instructions and summary information was reviewed with the patient as documented in the AVS. This note was prepared with assistance of Dragon voice recognition software. Occasional wrong-word or sound-a-like substitutions may have occurred due to the inherent limitations of voice recognition software

## 2023-10-09 NOTE — Patient Instructions (Addendum)
 Please return in 3 months to recheck diabetes and blood pressure and thyroid levels  If you have any questions or concerns, please don't hesitate to send me a message via MyChart or call the office at [phone]. Thank you for visiting with [place] today! It's our pleasure caring for you.   VISIT SUMMARY:  Today, we reviewed your diabetes management, blood pressure, sleep issues, thyroid medication, and foot condition. We discussed adjustments to your medications and recommended some additional tests and follow-ups.  YOUR PLAN:  -TYPE 2 DIABETES MELLITUS: Your A1c level has improved to 7.8 but is still above the target range. We recommend restarting Jardiance if your insurance covers it, as it can help lower your A1c and protect your kidneys. Continue taking metformin and Toujeo, and keep monitoring your blood sugar levels regularly.  -HYPERTENSION: Your blood pressure was a bit high today at 142/88 mmHg. We will increase your lisinopril dose to 10 mg daily to help control it better. Please bring your home blood pressure cuff to your next appointment so we can compare readings.  -HYPOTHYROIDISM: Your thyroid levels are slightly off. It's important to take the correct dose of your thyroid medication. Please pick up and start the correct lower dose as prescribed.  -INSOMNIA: You have been experiencing long-standing sleep difficulties. We will increase your gabapentin dose to 200 mg in the morning and 400 mg at night. If this adjustment does not help, we may consider adding trazodone.  -FOOT SURGERY COMPLICATIONS: You have a history of foot surgery with some complications. We advise against further surgery due to the risk of losing your foot. Your pulse is good, and there are no immediate concerns, but please monitor your foot condition and report any changes.  -GENERAL HEALTH MAINTENANCE: You are due for a mammogram. We discussed scheduling options and will order the mammogram for you. Please schedule  it at the breast center.  INSTRUCTIONS:  Please follow up with the following instructions: 1. Restart Jardiance if your insurance covers it. 2. Continue taking metformin and Toujeo. 3. Monitor your blood glucose levels regularly. 4. Increase lisinopril to 10 mg daily. 5. Bring your home blood pressure cuff to your next appointment. 6. Pick up and start the correct lower dose of your thyroid medication. 7. Increase gabapentin to 200 mg in the morning and 400 mg at night. 8. Consider trazodone if gabapentin adjustment is insufficient. 9. Schedule your mammogram at the breast center.  Please call the office checked below to schedule your appointment for your mammogram and/or bone density screen (the checked studies were ordered): [x]   Mammogram  [x]   Bone Density  [x]   [facility]     [address] [place], [place]        [phone]         []   [facility]  [address] [place], [place]  [phone]

## 2023-10-22 ENCOUNTER — Other Ambulatory Visit: Payer: Self-pay | Admitting: Family Medicine

## 2023-10-22 DIAGNOSIS — M62838 Other muscle spasm: Secondary | ICD-10-CM

## 2023-10-22 MED ORDER — CYCLOBENZAPRINE HCL 10 MG PO TABS: 10.0000 mg | ORAL_TABLET | Freq: Three times a day (TID) | ORAL | 0 refills | Status: AC | PRN

## 2023-10-22 NOTE — Telephone Encounter (Signed)
 Copied from CRM [id number]. Topic: Clinical - Medication Refill >> Oct 22, 2023  1:27 PM Florestine Avers wrote: Most Recent Primary Care Visit:  Provider: Willow Ora  Department: [facility]  Visit Type: OFFICE VISIT  Date: 10/09/2023  Medication: cyclobenzaprine (FLEXERIL) 10 MG tablet  Has the patient contacted their pharmacy? Yes (Agent: If no, request that the patient contact the pharmacy for the refill. If patient does not wish to contact the pharmacy document the reason why and proceed with request.) (Agent: If yes, when and what did the pharmacy advise?)  Is this the correct pharmacy for this prescription? Yes If no, delete pharmacy and type the correct one.  This is the patient's preferred pharmacy:  [id number] [place], [place] - [address] [place] [address] Phone: [phone] Fax: [phone]  [id number] - [place], [place] - [address] [place] [address] Phone: [phone] Fax: [phone]   Has the prescription been filled recently? Yes  Is the patient out of the medication? Yes  Has the patient been seen for an appointment in the last year OR does the patient have an upcoming appointment? Yes  Can we respond through MyChart? Yes  Agent: Please be advised that Rx refills may take up to 3 business days. We ask that you follow-up with your pharmacy.

## 2023-10-24 DIAGNOSIS — M4326 Fusion of spine, lumbar region: Secondary | ICD-10-CM | POA: Diagnosis not present

## 2023-10-24 DIAGNOSIS — M5416 Radiculopathy, lumbar region: Secondary | ICD-10-CM | POA: Diagnosis not present

## 2023-10-25 ENCOUNTER — Other Ambulatory Visit: Payer: Self-pay | Admitting: Orthopaedic Surgery

## 2023-10-25 ENCOUNTER — Other Ambulatory Visit: Payer: Self-pay | Admitting: Family Medicine

## 2023-10-25 DIAGNOSIS — M5416 Radiculopathy, lumbar region: Secondary | ICD-10-CM

## 2023-10-25 DIAGNOSIS — Z780 Asymptomatic menopausal state: Secondary | ICD-10-CM

## 2023-11-01 ENCOUNTER — Other Ambulatory Visit: Payer: Self-pay | Admitting: Family Medicine

## 2023-11-03 ENCOUNTER — Ambulatory Visit: Admission: RE | Admit: 2023-11-03 | Source: Ambulatory Visit

## 2023-11-04 ENCOUNTER — Ambulatory Visit
Admission: RE | Admit: 2023-11-04 | Discharge: 2023-11-04 | Disposition: A | Discharge status: Home / Self Care | Source: Ambulatory Visit | Attending: Orthopaedic Surgery | Admitting: Orthopaedic Surgery

## 2023-11-04 DIAGNOSIS — M48061 Spinal stenosis, lumbar region without neurogenic claudication: Secondary | ICD-10-CM | POA: Diagnosis not present

## 2023-11-04 DIAGNOSIS — M5416 Radiculopathy, lumbar region: Secondary | ICD-10-CM

## 2023-11-04 DIAGNOSIS — Z981 Arthrodesis status: Secondary | ICD-10-CM | POA: Diagnosis not present

## 2023-11-04 DIAGNOSIS — M5126 Other intervertebral disc displacement, lumbar region: Secondary | ICD-10-CM | POA: Diagnosis not present

## 2023-11-04 DIAGNOSIS — M47816 Spondylosis without myelopathy or radiculopathy, lumbar region: Secondary | ICD-10-CM | POA: Diagnosis not present

## 2023-11-06 DIAGNOSIS — N390 Urinary tract infection, site not specified: Secondary | ICD-10-CM | POA: Diagnosis not present

## 2023-11-13 ENCOUNTER — Other Ambulatory Visit: Payer: Self-pay | Admitting: Family

## 2023-11-13 DIAGNOSIS — H43813 Vitreous degeneration, bilateral: Secondary | ICD-10-CM | POA: Diagnosis not present

## 2023-11-13 DIAGNOSIS — E119 Type 2 diabetes mellitus without complications: Secondary | ICD-10-CM | POA: Diagnosis not present

## 2023-11-13 DIAGNOSIS — H26493 Other secondary cataract, bilateral: Secondary | ICD-10-CM | POA: Diagnosis not present

## 2023-11-13 DIAGNOSIS — H524 Presbyopia: Secondary | ICD-10-CM | POA: Diagnosis not present

## 2023-11-13 DIAGNOSIS — Z794 Long term (current) use of insulin: Secondary | ICD-10-CM

## 2023-11-13 DIAGNOSIS — H52203 Unspecified astigmatism, bilateral: Secondary | ICD-10-CM | POA: Diagnosis not present

## 2023-11-13 DIAGNOSIS — H353132 Nonexudative age-related macular degeneration, bilateral, intermediate dry stage: Secondary | ICD-10-CM | POA: Diagnosis not present

## 2023-11-13 LAB — HM DIABETES EYE EXAM

## 2023-11-26 DIAGNOSIS — H26492 Other secondary cataract, left eye: Secondary | ICD-10-CM | POA: Diagnosis not present

## 2023-11-28 DIAGNOSIS — M47816 Spondylosis without myelopathy or radiculopathy, lumbar region: Secondary | ICD-10-CM | POA: Diagnosis not present

## 2023-12-05 DIAGNOSIS — M47816 Spondylosis without myelopathy or radiculopathy, lumbar region: Secondary | ICD-10-CM | POA: Diagnosis not present

## 2023-12-18 ENCOUNTER — Ambulatory Visit: Payer: Self-pay

## 2023-12-18 NOTE — Telephone Encounter (Signed)
 FYI Only or Action Required?: Action required by provider  Patient was last seen in primary care on 10/09/2023 by Luevenia Saha, MD. Called Nurse Triage reporting Dysuria. Symptoms began yesterday. Interventions attempted: Prescription medications: Pyridium . Symptoms are: gradually worsening.  Triage Disposition: See Physician Within 24 Hours  Patient/caregiver understands and will follow disposition?: YesCopied from CRM [id number]. Topic: Clinical - Red Word Triage >> Dec 18, 2023  3:51 PM Shardie S wrote: [facility] that prompted transfer to Nurse Triage: pain and burning with urination Reason for Disposition  Urinating more frequently than usual (i.e., frequency)  Answer Assessment - Initial Assessment Questions 1. SYMPTOM: "What's the main symptom you're concerned about?" (e.g., frequency, incontinence)     Urgency  2. ONSET: "When did the   issue  start?"     Yesterday  3. PAIN: "Is there any pain?" If Yes, ask: "How bad is it?" (Scale: 1-10; mild, moderate, severe)     8 4. CAUSE: "What do you think is causing the symptoms?"     Not sure 5. OTHER SYMPTOMS: "Do you have any other symptoms?" (e.g., blood in urine, fever, flank pain, pain with urination)     Pain  Protocols used: Urinary Symptoms-A-AH

## 2023-12-19 ENCOUNTER — Ambulatory Visit (INDEPENDENT_AMBULATORY_CARE_PROVIDER_SITE_OTHER): Admitting: Family Medicine

## 2023-12-19 ENCOUNTER — Encounter: Payer: Self-pay | Admitting: Family Medicine

## 2023-12-19 VITALS — BP 144/71 | HR 89 | Temp 97.7°F | Ht 59.0 in | Wt 170.0 lb

## 2023-12-19 DIAGNOSIS — N3000 Acute cystitis without hematuria: Secondary | ICD-10-CM | POA: Diagnosis not present

## 2023-12-19 DIAGNOSIS — W57XXXA Bitten or stung by nonvenomous insect and other nonvenomous arthropods, initial encounter: Secondary | ICD-10-CM

## 2023-12-19 DIAGNOSIS — S50862A Insect bite (nonvenomous) of left forearm, initial encounter: Secondary | ICD-10-CM | POA: Diagnosis not present

## 2023-12-19 DIAGNOSIS — R300 Dysuria: Secondary | ICD-10-CM

## 2023-12-19 DIAGNOSIS — R350 Frequency of micturition: Secondary | ICD-10-CM | POA: Diagnosis not present

## 2023-12-19 LAB — URINALYSIS, MICROSCOPIC ONLY

## 2023-12-19 MED ORDER — PHENAZOPYRIDINE HCL 200 MG PO TABS: 200.0000 mg | ORAL_TABLET | Freq: Three times a day (TID) | ORAL | 0 refills | Status: AC | PRN

## 2023-12-19 MED ORDER — NITROFURANTOIN MONOHYD MACRO 100 MG PO CAPS: 100.0000 mg | ORAL_CAPSULE | Freq: Two times a day (BID) | ORAL | 0 refills | Status: DC

## 2023-12-19 NOTE — Progress Notes (Signed)
 Subjective   CC:  Chief Complaint  Patient presents with   Dysuria   Urinary Frequency    Painful urination and burning     HPI: Marissa Thompson is a 73 y.o. female who presents to the office today to address the problems listed above in the chief complaint. Patient reports dysuria and urinary frequency.  She has sensation of increased urinary pressure. She is taking pyridium. She denies fevers flank pain nausea vomiting or gross hematuria.  Symptoms have been present for several days.  She denies history of interstitial cystitis.  She denies vaginal symptoms including vaginal discharge or pelvic pain.  Reports that she removed a tick from her left forearm 2 days ago.  It was not on long.  No systemic symptoms.  No fever.  No rash.  She thinks she might have scraped 1 off of her upper back as well but she is not certain. Has diabetic follow-up in a few weeks.  Assessment  1. Acute cystitis without hematuria   2. Frequent urination   3. Dysuria   4. Tick bite of left forearm, initial encounter      Plan  Acute cystitis: Will check urine microscopy and urine culture.  Antibiotics ordered.  Pyridium as needed Tick bite: No rash or systemic symptoms.  Low risk for tickborne illness.  Reassured  Follow up: as scheduled in May for diabetic follow-up Orders Placed This Encounter  Procedures   Urine Culture   Urine Microscopic Only   No orders of the defined types were placed in this encounter.     I reviewed the patients updated PMH, FH, and SocHx.    Patient Active Problem List   Diagnosis Date Noted   Major depression, recurrent, chronic (HCC) 08/29/2017    Priority: High   Uncontrolled IDDM-2 with hyperglycemia and neuropathy 03/09/2015    Priority: High   Memory loss 03/08/2015    Priority: High   Hypertension associated with diabetes (HCC)     Priority: High   Mixed hyperlipidemia     Priority: High   Hypothyroidism     Priority: High   Restless leg syndrome  08/08/2018    Priority: Medium    Macular degeneration 07/31/2018    Priority: Medium    S/P cervical discectomy 04/28/2018    Priority: Medium    Carpal tunnel syndrome on right 08/29/2016    Priority: Medium    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/2016    Priority: Medium    Dupuytren's contracture of left hand 12/21/2015    Priority: Medium    Dysphagia, pharyngoesophageal phase 12/29/2014    Priority: Medium    GERD (gastroesophageal reflux disease)     Priority: Medium    Seborrheic dermatitis of scalp 05/13/2018    Priority: Low   Eczema of both external ears 12/19/2017    Priority: Low   Vitamin D  deficiency 03/03/2014    Priority: Low   Allergy     Priority: Low   Cellulitis of left foot 01/10/2022   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   buPROPion  (WELLBUTRIN  SR) 200 MG 12 hr tablet Take 200 mg by mouth 2 (two) times daily.   clonazePAM  (KLONOPIN ) 0.5  MG tablet Take 1 tablet (0.5 mg total) by mouth 2 (two) times daily as needed for anxiety.   cyclobenzaprine  (FLEXERIL ) 10 MG tablet Take 1 tablet (10 mg total) by mouth 3 (three) times daily as needed.   diphenhydrAMINE  HCl (BENADRYL  ALLERGY PO) Take by mouth.   FLUoxetine  (PROZAC ) 20 MG tablet Take 1 tablet (20 mg total) by mouth daily.   gabapentin  (NEURONTIN ) 600 MG tablet Take 1 tablet (600 mg total) by mouth 3 (three) times daily.   levothyroxine  (SYNTHROID ) 88 MCG tablet Take 1 tablet (88 mcg total) by mouth daily.   lisinopril  (ZESTRIL ) 10 MG tablet Take 0.5 tablets (5 mg total) by mouth daily.   Melatonin 10 MG CAPS Take 20 mg by mouth at bedtime.   metFORMIN  (GLUCOPHAGE ) 1000 MG tablet TAKE 1 TABLET BY MOUTH TWICE A DAY WITH FOOD   Omega-3 Fatty Acids  (FISH OIL) 1000 MG CAPS Take by mouth.   OVER THE COUNTER MEDICATION Take 2 tablets by mouth daily. vision shield   pantoprazole  (PROTONIX ) 40 MG tablet TAKE 1 TABLET BY MOUTH EVERY DAY   pravastatin  (PRAVACHOL ) 40 MG tablet TAKE 1 TABLET BY MOUTH EVERYDAY AT BEDTIME   TOUJEO  SOLOSTAR 300 UNIT/ML Solostar Pen Inject 40 Units into the skin daily after breakfast.   traZODone  (DESYREL ) 50 MG tablet TAKE 0.5-1 TABLETS BY MOUTH AT BEDTIME AS NEEDED FOR SLEEP.    Review of Systems: Cardiovascular: negative for chest pain Respiratory: negative for SOB or persistent cough Gastrointestinal: negative for abdominal pain Constitutional: Negative for fever malaise or anorexia  Objective  Vitals: BP (!) 144/71   Pulse 89   Temp 97.7 F (36.5 C)   Ht 4\' 11"  (1.499 m)   Wt 170 lb (77.1 kg)   SpO2 94%   BMI 34.34 kg/m  General: no acute distress  Psych:  Alert and oriented, normal mood and affect Gastrointestinal: soft, flat abdomen, normal active bowel sounds, no palpable masses, no hepatosplenomegaly, no appreciated hernias, NO CVAT, mild suprapubic ttp w/o rebound or guarding Skin:  Warm, no rashes, upper back with small red area without foreign body present   Commons side effects, risks, benefits, and alternatives for medications and treatment plan prescribed today were discussed, and the patient expressed understanding of the given instructions. Patient is instructed to call or message via MyChart if he/she has any questions or concerns regarding our treatment plan. No barriers to understanding were identified. We discussed Red Flag symptoms and signs in detail. Patient expressed understanding regarding what to do in case of urgent or emergency type symptoms.  Medication list was reconciled, printed and provided to the patient in AVS. Patient instructions and summary information was reviewed with the patient as documented in the AVS. This note was prepared with assistance of Dragon voice  recognition software. Occasional wrong-word or sound-a-like substitutions may have occurred due to the inherent limitations of voice recognition software

## 2023-12-21 LAB — URINE CULTURE
MICRO NUMBER:: 16543794
SPECIMEN QUALITY:: ADEQUATE

## 2023-12-22 ENCOUNTER — Ambulatory Visit: Payer: Self-pay | Admitting: Family Medicine

## 2023-12-22 NOTE — Progress Notes (Signed)
 See my chart note.

## 2024-01-02 ENCOUNTER — Other Ambulatory Visit: Payer: Self-pay | Admitting: Family Medicine

## 2024-01-02 DIAGNOSIS — Z794 Long term (current) use of insulin: Secondary | ICD-10-CM

## 2024-01-02 DIAGNOSIS — E1149 Type 2 diabetes mellitus with other diabetic neurological complication: Secondary | ICD-10-CM

## 2024-01-07 NOTE — Telephone Encounter (Signed)
 12/19/2023 LOV  09/09/2023 fill date  90/3 refills

## 2024-01-09 ENCOUNTER — Other Ambulatory Visit: Payer: Self-pay | Admitting: *Deleted

## 2024-01-09 MED ORDER — FLUOXETINE HCL (PMDD) 20 MG PO TABS: 1.0000 | ORAL_TABLET | Freq: Every day | ORAL | 3 refills | Status: DC

## 2024-01-10 ENCOUNTER — Ambulatory Visit: Admitting: Family

## 2024-01-13 ENCOUNTER — Encounter: Payer: Self-pay | Admitting: Family Medicine

## 2024-01-13 ENCOUNTER — Ambulatory Visit: Admitting: Family Medicine

## 2024-01-13 VITALS — BP 138/75 | HR 90 | Temp 97.8°F | Ht 59.0 in | Wt 167.6 lb

## 2024-01-13 DIAGNOSIS — E039 Hypothyroidism, unspecified: Secondary | ICD-10-CM

## 2024-01-13 DIAGNOSIS — E1159 Type 2 diabetes mellitus with other circulatory complications: Secondary | ICD-10-CM | POA: Diagnosis not present

## 2024-01-13 DIAGNOSIS — I152 Hypertension secondary to endocrine disorders: Secondary | ICD-10-CM | POA: Diagnosis not present

## 2024-01-13 DIAGNOSIS — F339 Major depressive disorder, recurrent, unspecified: Secondary | ICD-10-CM

## 2024-01-13 DIAGNOSIS — E119 Type 2 diabetes mellitus without complications: Secondary | ICD-10-CM | POA: Diagnosis not present

## 2024-01-13 DIAGNOSIS — Z794 Long term (current) use of insulin: Secondary | ICD-10-CM | POA: Diagnosis not present

## 2024-01-13 DIAGNOSIS — E1165 Type 2 diabetes mellitus with hyperglycemia: Secondary | ICD-10-CM

## 2024-01-13 LAB — POCT GLYCOSYLATED HEMOGLOBIN (HGB A1C): Hemoglobin A1C: 7.6 % — AB (ref 4.0–5.6)

## 2024-01-13 MED ORDER — LISINOPRIL 10 MG PO TABS: 10.0000 mg | ORAL_TABLET | Freq: Every day | ORAL | 3 refills | Status: AC

## 2024-01-13 MED ORDER — TOUJEO SOLOSTAR 300 UNIT/ML ~~LOC~~ SOPN: 40.0000 [IU] | PEN_INJECTOR | Freq: Every day | SUBCUTANEOUS | 3 refills | Status: AC

## 2024-01-13 MED ORDER — TIRZEPATIDE 2.5 MG/0.5ML ~~LOC~~ SOAJ: 2.5000 mg | SUBCUTANEOUS | 2 refills | Status: DC

## 2024-01-13 NOTE — Progress Notes (Signed)
 Subjective  CC:  Chief Complaint  Patient presents with   Hypertension   Diabetes    HPI: Marissa Thompson is a 73 y.o. female who presents to the office today for follow up of diabetes and problems listed above in the chief complaint.  Discussed the use of AI scribe software for clinical note transcription with the patient, who gave verbal consent to proceed.  History of Present Illness Marissa Thompson is a 73 year old female with type 2 diabetes mellitus who presents for diabetes management and medication review.  She is experiencing difficulty managing her type 2 diabetes mellitus due to the high cost of Jardiance , which she has not been able to obtain. Her hemoglobin A1c has improved from 7.8% to 7.6% with dietary changes. She is currently on metformin  1000 mg daily and Toujeo  insulin  40 units. She experienced a drop in blood sugar early in the morning when she forgot to take her insulin  and took it later than usual. She previously tried Ozempic but discontinued it due to severe gastrointestinal side effects.  She has a history of depression and anxiety, previously managed with Klonopin  and Prozac . She recently discontinued both medications, weaning off Prozac  due to drowsiness, which she attributes to trazodone  taken for sleep. She feels better without Prozac  but acknowledges past experiences of mood deterioration after stopping antidepressants. She has some remaining Prozac  at home but has not picked up recent refills.  She has been experiencing back pain, for which she had an MRI and is scheduled for a bundle block procedure. She has previously found relief from similar treatments. No current chest pain and notes that a 'funny feeling' she experienced has resolved since stopping Prozac .  Her blood pressure at home is typically around 130/70 mmHg. She has been taking only 5 mg of lisinopril  instead of the prescribed 10 mg and is afraid that taking the higher dose will cause her blood  pressure to drop too low.  She is currently living in her deceased brother's house, which is in probate, and plans to sell it and move back to [place]. She has been keeping busy with home renovations but is mindful of her back pain.    Wt Readings from Last 3 Encounters:  01/13/24 167 lb 9.6 oz (76 kg)  12/19/23 170 lb (77.1 kg)  10/09/23 172 lb 3.2 oz (78.1 kg)    BP Readings from Last 3 Encounters:  01/13/24 138/75  12/19/23 (!) 144/71  10/09/23 (!) 149/82    Assessment  1. Uncontrolled type 2 diabetes mellitus with hyperglycemia, with long-term current use of insulin  (HCC)   2. Hypothyroidism, unspecified type   3. Hypertension associated with diabetes (HCC)   4. Insulin -requiring or dependent type II diabetes mellitus (HCC)   5. Major depression, recurrent, chronic (HCC)      Plan  Assessment and Plan Assessment & Plan Type 2 Diabetes Mellitus Hemoglobin A1c improved to 7.6 with dietary changes. Further medication management needed. Mounjaro considered for glycemic control and weight loss, pending cost evaluation. Discussed patient assistance programs for medication coverage. - - Order Pat, evaluate tolerance and cost.  Will consult pharmacy team if needs help with patient assistance for either Mounjaro or Jardiance .  Continue metformin  and Toujeo  and diabetic diet - Monitor blood glucose levels, adjust medications as needed.  Hypertension Blood pressure slightly elevated. Increase lisinopril  to 10 mg for better control. Reassured about unlikely significant drop in blood pressure. - Increase lisinopril  to 10 mg daily. - Monitor blood  pressure at home, report significant drops.  Depression Previously on Prozac , weaned off due to drowsiness. Currently not on antidepressants, feeling better but high risk of recurrence. Discussed monitoring mood and potential need to restart medication. - Monitor mood over next 4-8 weeks for recurrence of symptoms. - Consider restarting  Prozac  or sertraline  if symptoms recur. - Discuss risks of recurrence and benefits of medication.  Chronic Back Pain Scheduled for bundle block procedure, previously provided relief. - Proceed with scheduled bundle block procedure.  General Health Maintenance Eligible for shingles vaccine, reassured about safety and efficacy. - Encourage receiving shingles vaccine at pharmacy.    Follow up: 3 months for diabetes recheck and blood pressure recheck Orders Placed This Encounter  Procedures   TSH   POCT HgB A1C   Meds ordered this encounter  Medications   TOUJEO  SOLOSTAR 300 UNIT/ML Solostar Pen    Sig: Inject 40 Units into the skin daily after breakfast.    Dispense:  18 mL    Refill:  3   lisinopril  (ZESTRIL ) 10 MG tablet    Sig: Take 1 tablet (10 mg total) by mouth daily.    Dispense:  90 tablet    Refill:  3   tirzepatide (MOUNJARO) 2.5 MG/0.5ML Pen    Sig: Inject 2.5 mg into the skin once a week.    Dispense:  7 mL    Refill:  2      Immunization History  Administered Date(s) Administered   Fluad Quad(high Dose 65+) 03/05/2019, 04/04/2020   Fluad Trivalent(High Dose 65+) 03/31/2023   Influenza, High Dose Seasonal PF 08/29/2017, 04/03/2018   Influenza-Unspecified 03/16/2013   PFIZER(Purple Top)SARS-COV-2 Vaccination 08/08/2019, 09/05/2019, 05/09/2020, 02/16/2021   PPD Test 02/08/2014, 03/09/2015, 10/24/2015, 06/19/2016, 09/28/2019   Pneumococcal Conjugate-13 08/29/2017   Pneumococcal Polysaccharide-23 07/17/2011, 07/31/2018   Td 07/17/2003   Tdap 12/10/2013    Diabetes Related Lab Review: Lab Results  Component Value Date   HGBA1C 7.6 (A) 01/13/2024   HGBA1C 7.8 (H) 09/09/2023   HGBA1C 7.6 (H) 12/06/2021    Lab Results  Component Value Date   MICROALBUR <0.7 09/09/2023   Lab Results  Component Value Date   CREATININE 0.72 09/09/2023   BUN 12 09/09/2023   NA 137 09/09/2023   K 4.2 09/09/2023   CL 98 09/09/2023   CO2 28 09/09/2023   Lab Results   Component Value Date   CHOL 149 09/09/2023   CHOL 142 10/02/2021   CHOL 161 10/11/2020   Lab Results  Component Value Date   HDL 82.40 09/09/2023   HDL 70.80 10/02/2021   HDL 66.90 10/11/2020   Lab Results  Component Value Date   LDLCALC 40 09/09/2023   LDLCALC 35 10/02/2021   LDLCALC 62 09/16/2019   Lab Results  Component Value Date   TRIG 134.0 09/09/2023   TRIG 179.0 (H) 10/02/2021   TRIG 253.0 (H) 10/11/2020   Lab Results  Component Value Date   CHOLHDL 2 09/09/2023   CHOLHDL 2 10/02/2021   CHOLHDL 2 10/11/2020   Lab Results  Component Value Date   LDLDIRECT 61.0 10/11/2020   The 10-year ASCVD risk score (Arnett DK, et al., 2019) is: 25.5%   Values used to calculate the score:     Age: 36 years     Clincally relevant sex: Female     Is Non-Hispanic African American: No     Diabetic: Yes     Tobacco smoker: No     Systolic Blood Pressure: 138 mmHg  Is BP treated: Yes     HDL Cholesterol: 82.4 mg/dL     Total Cholesterol: 149 mg/dL I have reviewed the PMH, Fam and Soc history. Patient Active Problem List   Diagnosis Date Noted   Major depression, recurrent, chronic (HCC) 08/29/2017    Priority: High    Had been on zoloft  for years: changed to paxil  2018 with good results. Added wellbutrin  07/2018 with good results Then prozac . Stopped 2025 due to drowsiness. 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    Priority:  Medium    Dupuytren's contracture of left hand 12/21/2015    Priority: Medium    Dysphagia, pharyngoesophageal phase 12/29/2014    Priority: Medium     Nl EGD 2019    GERD (gastroesophageal reflux disease)     Priority: Medium    Seborrheic dermatitis of scalp 05/13/2018    Priority: Low   Eczema of both external ears 12/19/2017    Priority: Low   Vitamin D  deficiency 03/03/2014    Priority: Low   Allergy     Priority: Low   Cellulitis of left foot 01/10/2022   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 She has never used smokeless tobacco. She reports that she does not currently use alcohol. She reports that she does not use drugs.  Review of Systems: Ophthalmic: negative for eye pain, loss of vision or double vision Cardiovascular: negative for chest pain Respiratory: negative for SOB or persistent cough Gastrointestinal: negative for abdominal pain Genitourinary: negative for dysuria or gross hematuria MSK: negative for foot lesions Neurologic: negative for weakness or gait disturbance  Objective  Vitals: BP 138/75   Pulse 90   Temp 97.8 F (36.6 C)   Ht 4' 11 (1.499 m)   Wt 167 lb 9.6 oz (76 kg)   SpO2 96%   BMI 33.85 kg/m  General: well appearing, no acute distress  Psych:  Alert and oriented, normal mood and affect HEENT:  Normocephalic, atraumatic, moist mucous membranes, supple neck  Cardiovascular:  Nl S1 and S2, RRR without murmur, gallop or rub. no edema Respiratory:  Good breath sounds bilaterally, CTAB with normal effort, no rales  Diabetic education: ongoing education regarding chronic disease management for diabetes was given today. We continue to reinforce the ABC's of diabetic management: A1c (<7 or 8 dependent upon patient), tight blood pressure control, and cholesterol management with goal LDL < 100 minimally. We discuss diet strategies, exercise  recommendations, medication options and possible side effects. At each visit, we review recommended immunizations and preventive care recommendations for diabetics and stress that good diabetic control can prevent other problems. See below for this patient's data. Commons side effects, risks, benefits, and alternatives for medications and treatment plan prescribed today were discussed,  and the patient expressed understanding of the given instructions. Patient is instructed to call or message via MyChart if he/she has any questions or concerns regarding our treatment plan. No barriers to understanding were identified. We discussed Red Flag symptoms and signs in detail. Patient expressed understanding regarding what to do in case of urgent or emergency type symptoms.  Medication list was reconciled, printed and provided to the patient in AVS. Patient instructions and summary information was reviewed with the patient as documented in the AVS. This note was prepared with assistance of Dragon voice recognition software. Occasional wrong-word or sound-a-like substitutions may have occurred due to the inherent limitations of voice recognition software

## 2024-01-13 NOTE — Patient Instructions (Signed)
 Please return in 3 months to recheck diabetes and blood pressure    I will release your lab results to you on your MyChart account with further instructions. You may see the results before I do, but when I review them I will send you a message with my report or have my assistant call you if things need to be discussed. Please reply to my message with any questions. Thank you!   If you have any questions or concerns, please don't hesitate to send me a message via MyChart or call the office at [phone]. Thank you for visiting with us  today! It's our pleasure caring for you.    VISIT SUMMARY: Today, you came in for a follow-up on your type 2 diabetes management and medication review. We discussed your current medications, blood sugar levels, blood pressure, mood, and back pain. We also talked about some changes to your treatment plan and general health maintenance.  YOUR PLAN: -TYPE 2 DIABETES MELLITUS: Type 2 diabetes is a condition where your body does not use insulin  properly, leading to high blood sugar levels. Your hemoglobin A1c has improved to 7.6% with dietary changes. We will look into Mounjaro for better blood sugar control and weight loss, depending on the cost. We also discussed patient assistance programs to help cover the cost of Jardiance  and Toujeo . Please continue to monitor your blood sugar levels and we will adjust your medications as needed.  -HYPERTENSION: Hypertension is high blood pressure. Your blood pressure is slightly elevated, so we recommend increasing your lisinopril  dose to 10 mg daily. Please monitor your blood pressure at home and report any significant drops.  -DEPRESSION: Depression is a mood disorder that causes persistent feelings of sadness and loss of interest. You have weaned off Prozac  due to drowsiness and are currently not on any antidepressants. We will monitor your mood over the next 4-8 weeks and consider restarting Prozac  or trying sertraline  if symptoms  return. It's important to be aware of the risks of recurrence and the benefits of medication.  -CHRONIC BACK PAIN: Chronic back pain is long-term pain in your back. You are scheduled for a bundle block procedure, which has provided relief in the past. We will proceed with this scheduled procedure.  -GENERAL HEALTH MAINTENANCE: You are eligible for the shingles vaccine, which is safe and effective. We encourage you to receive this vaccine at your pharmacy.  INSTRUCTIONS: Please follow up with the pharmacy team to explore patient assistance programs for Jardiance  and Toujeo . Monitor your blood glucose levels and blood pressure at home, and report any significant changes. Keep track of your mood over the next 4-8 weeks and let us  know if you experience any symptoms of depression. Proceed with your scheduled bundle block procedure for back pain relief. Lastly, consider getting the shingles vaccine at your pharmacy.                      Contains text generated by Abridge.                                 Contains text generated by Abridge.

## 2024-01-14 LAB — TSH: TSH: 0.39 u[IU]/mL (ref 0.35–5.50)

## 2024-01-15 ENCOUNTER — Ambulatory Visit

## 2024-01-15 VITALS — Ht 59.0 in | Wt 167.0 lb

## 2024-01-15 DIAGNOSIS — Z0000 Encounter for general adult medical examination without abnormal findings: Secondary | ICD-10-CM

## 2024-01-15 NOTE — Patient Instructions (Signed)
 Marissa Thompson , Thank you for taking time out of your busy schedule to complete your Annual Wellness Visit with me. I enjoyed our conversation and look forward to speaking with you again next year. I, as well as your care team,  appreciate your ongoing commitment to your health goals. Please review the following plan we discussed and let me know if I can assist you in the future. Your Game plan/ To Do List    Referrals: If you haven't heard from the office you've been referred to, please reach out to them at the phone provided.   Follow up Visits: Next Medicare AWV with our clinical staff: 01/19/25   Have you seen your provider in the last 6 months (3 months if uncontrolled diabetes)? Yes Next Office Visit with your provider: 04/15/24  Clinician Recommendations:  Each day, aim for 6 glasses of water, plenty of protein in your diet and try to get up and walk/ stretch every hour for 5-10 minutes at a time.        This is a list of the screening recommended for you and due dates:  Health Maintenance  Topic Date Due   Zoster (Shingles) Vaccine (1 of 2) Never done   DEXA scan (bone density measurement)  Never done   Eye exam for diabetics  11/12/2023   DTaP/Tdap/Td vaccine (3 - Td or Tdap) 12/11/2023   Medicare Annual Wellness Visit  12/27/2023   COVID-19 Vaccine (5 - 2024-25 season) 01/29/2024*   Mammogram  01/24/2024   Flu Shot  02/14/2024   Hemoglobin A1C  07/14/2024   Yearly kidney function blood test for diabetes  09/08/2024   Yearly kidney health urinalysis for diabetes  09/08/2024   Complete foot exam   10/08/2024   Colon Cancer Screening  07/26/2027   Pneumococcal Vaccine for age over 21  Completed   Hepatitis C Screening  Completed   Hepatitis B Vaccine  Aged Out   HPV Vaccine  Aged Out   Meningitis B Vaccine  Aged Out  *Topic was postponed. The date shown is not the original due date.    Advanced directives: (Declined) Advance directive discussed with you today. Even though you  declined this today, please call our office should you change your mind, and we can give you the proper paperwork for you to fill out. Advance Care Planning is important because it:  [x]  Makes sure you receive the medical care that is consistent with your values, goals, and preferences  [x]  It provides guidance to your family and loved ones and reduces their decisional burden about whether or not they are making the right decisions based on your wishes.  Follow the link provided in your after visit summary or read over the paperwork we have mailed to you to help you started getting your Advance Directives in place. If you need assistance in completing these, please reach out to us  so that we can help you!  See attachments for Preventive Care and Fall Prevention Tips.

## 2024-01-15 NOTE — Progress Notes (Addendum)
 Subjective:   Marissa Thompson is a 73 y.o. who presents for a Medicare Wellness preventive visit.  As a reminder, Annual Wellness Visits don't include a physical exam, and some assessments may be limited, especially if this visit is performed virtually. We may recommend an in-person follow-up visit with your provider if needed.  Visit Complete: Virtual I connected with  Marissa Thompson on 01/15/24 by a audio enabled telemedicine application and verified that I am speaking with the correct person using two identifiers.  Patient Location: Home  Provider Location: Home Office  I discussed the limitations of evaluation and management by telemedicine. The patient expressed understanding and agreed to proceed.  Vital Signs: Because this visit was a virtual/telehealth visit, some criteria may be missing or patient reported. Any vitals not documented were not able to be obtained and vitals that have been documented are patient reported.  VideoDeclined- This patient declined Librarian, academic. Therefore the visit was completed with audio only.  Persons Participating in Visit: Patient.  AWV Questionnaire: No: Patient Medicare AWV questionnaire was not completed prior to this visit.  Cardiac Risk Factors include: advanced age (>7men, >26 women);dyslipidemia;hypertension;diabetes mellitus;obesity (BMI >30kg/m2)     Objective:    Today's Vitals   01/15/24 1514  Weight: 167 lb (75.8 kg)  Height: 4' 11 (1.499 m)   Body mass index is 33.73 kg/m.     01/15/2024    3:23 PM 01/10/2022   11:00 PM 01/10/2022    4:04 PM 12/21/2021    7:36 AM 12/20/2021    3:03 PM 02/18/2020    2:48 PM 02/28/2018    6:19 PM  Advanced Directives  Does Patient Have a Medical Advance Directive? No No No No No Yes Yes   Type of Advance Directive      Living will Living will  Would patient like information on creating a medical advance directive? No - Patient declined No - Patient declined   No - Patient declined        Data saved with a previous flowsheet row definition    Current Medications (verified) Outpatient Encounter Medications as of 01/15/2024  Medication Sig   aspirin  EC 81 MG tablet Take 81 mg by mouth daily.   cyclobenzaprine  (FLEXERIL ) 10 MG tablet Take 1 tablet (10 mg total) by mouth 3 (three) times daily as needed.   diphenhydrAMINE  HCl (BENADRYL  ALLERGY PO) Take by mouth.   gabapentin  (NEURONTIN ) 600 MG tablet TAKE 1 TABLET BY MOUTH THREE TIMES A DAY   levothyroxine  (SYNTHROID ) 88 MCG tablet Take 1 tablet (88 mcg total) by mouth daily.   lisinopril  (ZESTRIL ) 10 MG tablet Take 1 tablet (10 mg total) by mouth daily.   Melatonin 10 MG CAPS Take 20 mg by mouth at bedtime.   metFORMIN  (GLUCOPHAGE ) 1000 MG tablet TAKE 1 TABLET BY MOUTH TWICE A DAY WITH FOOD   Omega-3 Fatty Acids (FISH OIL) 1000 MG CAPS Take by mouth.   OVER THE COUNTER MEDICATION Take 2 tablets by mouth daily. vision shield   pantoprazole  (PROTONIX ) 40 MG tablet TAKE 1 TABLET BY MOUTH EVERY DAY   phenazopyridine  (PYRIDIUM ) 200 MG tablet Take 1 tablet (200 mg total) by mouth 3 (three) times daily as needed for pain.   pravastatin  (PRAVACHOL ) 40 MG tablet TAKE 1 TABLET BY MOUTH EVERYDAY AT BEDTIME   TOUJEO  SOLOSTAR 300 UNIT/ML Solostar Pen Inject 40 Units into the skin daily after breakfast.   traZODone  (DESYREL ) 50 MG tablet TAKE 0.5-1 TABLETS  BY MOUTH AT BEDTIME AS NEEDED FOR SLEEP.   empagliflozin  (JARDIANCE ) 25 MG TABS tablet Take 1 tablet (25 mg total) by mouth daily before breakfast. (Patient not taking: Reported on 01/15/2024)   tirzepatide (MOUNJARO) 2.5 MG/0.5ML Pen Inject 2.5 mg into the skin once a week. (Patient not taking: Reported on 01/15/2024)   [DISCONTINUED] albuterol  (VENTOLIN  HFA) 108 (90 Base) MCG/ACT inhaler INHALE 2 PUFFS INTO THE LUNGS EVERY 6 HOURS AS NEEDED FOR WHEEZING OR SHORTNESS OF BREATH (OR COUGH) (Patient not taking: Reported on 01/15/2024)   [DISCONTINUED] buPROPion   (WELLBUTRIN  SR) 200 MG 12 hr tablet Take 200 mg by mouth 2 (two) times daily. (Patient not taking: Reported on 01/15/2024)   [DISCONTINUED] nitrofurantoin , macrocrystal-monohydrate, (MACROBID ) 100 MG capsule Take 1 capsule (100 mg total) by mouth 2 (two) times daily.   No facility-administered encounter medications on file as of 01/15/2024.    Allergies (verified) Toradol  [ketorolac  tromethamine ], Doxycycline , Baclofen, Morphine  and codeine, Penicillins, and Talwin [pentazocine]   History: Past Medical History:  Diagnosis Date   Allergy    Anxiety 2003   Carpal tunnel syndrome, bilateral    Cough    for over a year per pt-   Depression 1998   Diabetes mellitus without complication (HCC) 1998   Dupuytren's contracture of left hand    DVT (deep venous thrombosis) (HCC) 2007   left calf x2   GERD (gastroesophageal reflux disease) 1980   History of alcoholism (HCC)    Chronic   HLD (hyperlipidemia)    Hyperlipidemia 1990   Hypertension    Hypothyroidism 1998   Macular degeneration 07/31/2018   2020   Macular degeneration 12/2019   PONV (postoperative nausea and vomiting)    1 time per pt in 2001   Short-term memory loss    SOB (shortness of breath) on exertion    Past Surgical History:  Procedure Laterality Date   ANTERIOR CERVICAL DISCECTOMY  02/2018   BACK SURGERY     x3 Lumbar   CARPAL TUNNEL RELEASE Left 03/08/2016   Procedure: LEFT CARPAL TUNNEL RELEASE;  Surgeon: Franky Curia, MD;  Location: [facility];  Service: Orthopedics;  Laterality: Left;   CATARACT EXTRACTION Bilateral 6/21-7/21   CERVICAL POLYPECTOMY N/A 08/17/2013   Procedure: CERVICAL POLYPECTOMY;  Surgeon: Charlie JONETTA Aho, MD;  Location: [facility];  Service: Gynecology;  Laterality: N/A;   COLON SURGERY  2007   benign mass   Dupuytren's release Left 2017   ECTOPIC PREGNANCY SURGERY  1979   ESOPHAGEAL DILATION     x3   EYE SURGERY     FOOT ARTHRODESIS Left 12/21/2021   Procedure: ARTHRODESIS  LIS FRANC;  Surgeon: Janit Thresa HERO, DPM;  Location: [facility];  Service: Podiatry;  Laterality: Left;   HALLUX VALGUS LAPIDUS Left 12/21/2021   Procedure: HALLUX VALGUS LAPIDUS;  Surgeon: Janit Thresa HERO, DPM;  Location: [facility];  Service: Podiatry;  Laterality: Left;   HYSTEROSCOPY WITH D & C N/A 08/17/2013   Procedure: DILATATION AND CURETTAGE /HYSTEROSCOPY;  Surgeon: Charlie JONETTA Aho, MD;  Location: [facility];  Service: Gynecology;  Laterality: N/A;  YAG LASER   NECK SURGERY     c5-7 ACDF 3/15/has screws and plate in neck   TONSILLECTOMY     TRIGGER FINGER RELEASE Left 03/08/2016   Procedure: LEFT LONG TRIGGER RELEASE AND LEFT RING TRIGGER RELEASE;  Surgeon: Franky Curia, MD;  Location: [facility];  Service: Orthopedics;  Laterality: Left;   UPPER GASTROINTESTINAL ENDOSCOPY  Family History  Problem Relation Age of Onset   Depression Mother    Diabetes Mother    Hypertension Mother    Alcohol abuse Father    Arthritis Father    Cancer Father 81       Oral Cancer- had tongue, jaw resection--smoker and alcohol   Alcohol abuse Brother    Social History   Socioeconomic History   Marital status: Married    Spouse name: Not on file   Number of children: 1   Years of education: Not on file   Highest education level: Not on file  Occupational History   Occupation: Nurse  Tobacco Use   Smoking status: Never   Smokeless tobacco: Never  Vaping Use   Vaping status: Never Used  Substance and Sexual Activity   Alcohol use: Not Currently   Drug use: No   Sexual activity: Not Currently    Birth control/protection: Post-menopausal  Other Topics Concern   Not on file  Social History Narrative   Separated. Son lives with her.    On Disability--secondary to back. On disability since 2012.   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    Never smoked.    Social Drivers of Manufacturing engineer Strain: Low Risk  (01/15/2024)   Overall Financial Resource Strain (CARDIA)    Difficulty of Paying Living Expenses: Not hard at all  Food Insecurity: No Food Insecurity (01/15/2024)   Hunger Vital Sign    Worried About Running Out of Food in the Last Year: Never true    Ran Out of Food in the Last Year: Never true  Transportation Needs: No Transportation Needs (01/15/2024)   PRAPARE - Administrator, Civil Service (Medical): No    Lack of Transportation (Non-Medical): No  Physical Activity: Inactive (01/15/2024)   Exercise Vital Sign    Days of Exercise per Week: 0 days    Minutes of Exercise per Session: 0 min  Stress: No Stress Concern Present (01/15/2024)   [employer] of Occupational Health - Occupational Stress Questionnaire    Feeling of Stress: Not at all  Social Connections: Socially Isolated (01/15/2024)   Social Connection and Isolation Panel    Frequency of Communication with Friends and Family: More than three times a week    Frequency of Social Gatherings with Friends and Family: Once a week    Attends Religious Services: Never    Database administrator or Organizations: No    Attends Engineer, structural: Never    Marital Status: Divorced    Tobacco Counseling Counseling given: Not Answered    Clinical Intake:  Pre-visit preparation completed: Yes  Pain : No/denies pain     BMI - recorded: 33.73 Nutritional Status: BMI > 30  Obese Nutritional Risks: None Diabetes: Yes CBG done?: Yes (152 per pt) CBG resulted in Enter/ Edit results?: No Did pt. bring in CBG monitor from home?: No  Lab Results  Component Value Date   HGBA1C 7.6 (A) 01/13/2024   HGBA1C 7.8 (H) 09/09/2023   HGBA1C 7.6 (H) 12/06/2021     How often do you need to have someone help you when you read instructions, pamphlets, or other written materials from your doctor or pharmacy?: 1 - Never  Interpreter Needed?: No  Information entered by :: Ellouise Haws, LPN   Activities of Daily Living  01/15/2024    3:15 PM  In your present state of health, do you have any difficulty performing the following activities:  Hearing? 0  Vision? 0  Difficulty concentrating or making decisions? 0  Walking or climbing stairs? 0  Dressing or bathing? 0  Doing errands, shopping? 0  Preparing Food and eating ? N  Using the Toilet? N  In the past six months, have you accidently leaked urine? Y  Comment wears a pad  Do you have problems with loss of bowel control? N  Managing your Medications? N  Managing your Finances? N  Housekeeping or managing your Housekeeping? N    Patient Care Team: Jodie Lavern CROME, MD as PCP - General (Family Medicine) Gust Royden ORN, MD as Consulting Physician (Orthopedic Surgery) Pandora Cadet, [facility] as Pharmacist (Pharmacist) Fara Coy, PA-C as Orthopedic Physician Assistant - Certified (Physician Assistant) Janit Thresa HERO, DPM as Consulting Physician (Podiatry) Pa, [facility]  I have updated your Care Teams any recent Medical Services you may have received from other providers in the past year.     Assessment:   This is a routine wellness examination for Durenda.  Hearing/Vision screen Hearing Screening - Comments:: Pt denies any hearing issues  Vision Screening - Comments:: Wears rx glasses - up to date with routine eye exams with Dr patrcia    Goals Addressed             This Visit's Progress    Patient Stated       Weight loss       Depression Screen     01/15/2024    3:17 PM 12/19/2023   10:26 AM 09/09/2023    2:03 PM 07/24/2023    2:31 PM 10/02/2021    1:51 PM 02/03/2021   10:47 AM 10/11/2020   11:07 AM  PHQ 2/9 Scores  PHQ - 2 Score 0 0 0 6 3 0 6  PHQ- 9 Score   0 14 10 0 13    Fall Risk     01/15/2024    3:23 PM 12/19/2023   10:20 AM 09/09/2023    1:42 PM 07/24/2023    2:33 PM 12/06/2021    2:01 PM  Fall Risk   Falls in the past year? 1 0  0 0  Number falls in  past yr: 1 0 0 0 0  Injury with Fall? 0 0 0 0 0  Risk for fall due to : History of fall(s);Impaired balance/gait;Impaired mobility No Fall Risks No Fall Risks No Fall Risks No Fall Risks  Follow up Falls prevention discussed Falls evaluation completed Falls evaluation completed Falls evaluation completed;Education provided Falls evaluation completed      Data saved with a previous flowsheet row definition    MEDICARE RISK AT HOME:  Medicare Risk at Home Any stairs in or around the home?: Yes If so, are there any without handrails?: No Home free of loose throw rugs in walkways, pet beds, electrical cords, etc?: Yes Adequate lighting in your home to reduce risk of falls?: Yes Life alert?: Yes Use of a cane, walker or w/c?: No Grab bars in the bathroom?: Yes Shower chair or bench in shower?: No Elevated toilet seat or a handicapped toilet?: No  TIMED UP AND GO:  Was the test performed?  No  Cognitive Function: 6CIT completed    03/08/2015   11:01 AM  MMSE - Mini Mental State Exam  Orientation to time 5   Orientation to Place 5  Registration 3   Attention/ Calculation 5   Recall 3   Language- name 2 objects 2   Language- repeat 1  Language- follow 3 step command 3   Language- read & follow direction 1   Write a sentence 1   Copy design 1   Total score 30      Data saved with a previous flowsheet row definition        01/15/2024    3:24 PM  6CIT Screen  What Year? 0 points  What month? 0 points  What time? 0 points  Count back from 20 0 points  Months in reverse 0 points  Repeat phrase 0 points  Total Score 0 points    Immunizations Immunization History  Administered Date(s) Administered   Fluad Quad(high Dose 65+) 03/05/2019, 04/04/2020   Fluad Trivalent(High Dose 65+) 03/31/2023   Influenza, High Dose Seasonal PF 08/29/2017, 04/03/2018   Influenza-Unspecified 03/16/2013   PFIZER(Purple Top)SARS-COV-2 Vaccination 08/08/2019, 09/05/2019, 05/09/2020,  02/16/2021   PPD Test 02/08/2014, 03/09/2015, 10/24/2015, 06/19/2016, 09/28/2019   Pneumococcal Conjugate-13 08/29/2017   Pneumococcal Polysaccharide-23 07/17/2011, 07/31/2018   Td 07/17/2003   Tdap 12/10/2013    Screening Tests Health Maintenance  Topic Date Due   Zoster Vaccines- Shingrix  (1 of 2) Never done   DEXA SCAN  Never done   OPHTHALMOLOGY EXAM  11/12/2023   DTaP/Tdap/Td (3 - Td or Tdap) 12/11/2023   COVID-19 Vaccine (5 - 2024-25 season) 01/29/2024 (Originally 03/17/2023)   MAMMOGRAM  01/24/2024   INFLUENZA VACCINE  02/14/2024   HEMOGLOBIN A1C  07/14/2024   Diabetic kidney evaluation - eGFR measurement  09/08/2024   Diabetic kidney evaluation - Urine ACR  09/08/2024   FOOT EXAM  10/08/2024   Medicare Annual Wellness (AWV)  01/14/2025   Colonoscopy  07/26/2027   Pneumococcal Vaccine: 50+ Years  Completed   Hepatitis C Screening  Completed   Hepatitis B Vaccines  Aged Out   HPV VACCINES  Aged Out   Meningococcal B Vaccine  Aged Out    Health Maintenance  Health Maintenance Due  Topic Date Due   Zoster Vaccines- Shingrix  (1 of 2) Never done   DEXA SCAN  Never done   OPHTHALMOLOGY EXAM  11/12/2023   DTaP/Tdap/Td (3 - Td or Tdap) 12/11/2023   Health Maintenance Items Addressed: See Nurse Notes at the end of this note  Additional Screening:  Vision Screening: Recommended annual ophthalmology exams for early detection of glaucoma and other disorders of the eye. Would you like a referral to an eye doctor? No    Dental Screening: Recommended annual dental exams for proper oral hygiene  Community Resource Referral / Chronic Care Management: CRR required this visit?  No   CCM required this visit?  No   Plan:    I have personally reviewed and noted the following in the patient's chart:   Medical and social history Use of alcohol, tobacco or illicit drugs  Current medications and supplements including opioid prescriptions. Patient is not currently taking  opioid prescriptions. Functional ability and status Nutritional status Physical activity Advanced directives List of other physicians Hospitalizations, surgeries, and ER visits in previous 12 months Vitals Screenings to include cognitive, depression, and falls Referrals and appointments  In addition, I have reviewed and discussed with patient certain preventive protocols, quality metrics, and best practice recommendations. A written personalized care plan for preventive services as well as general preventive health recommendations were provided to patient.   Ellouise VEAR Haws, LPN   08/22/7972  After Visit Summary: (MyChart) Due to this being a telephonic visit, the after visit summary with patients personalized plan was offered to patient via MyChart   Notes: Nothing significant to report at this time. Pt stated dexa scan scheduled for 01/16/24

## 2024-01-17 ENCOUNTER — Ambulatory Visit: Payer: Self-pay | Admitting: Family Medicine

## 2024-01-17 NOTE — Progress Notes (Signed)
 Labs reviewed.    Dear Ms. Marissa Thompson, Thank you for allowing me to care for you at your recent office visit.  I wanted to let you know that I have reviewed your lab test results and am happy to report that they are improved.  Your thyroid  test is back in the normal range. We do not need to adjust your thyroid  medication dose.   Sincerely, Dr. Jodie

## 2024-01-22 ENCOUNTER — Telehealth: Payer: Self-pay

## 2024-01-22 NOTE — Telephone Encounter (Signed)
 Copied from CRM [id number]. Topic: Clinical - Medication Question >> Jan 21, 2024 12:11 PM Thersia BROCKS wrote: Reason for CRM: Patient called in regarding prescription tirzepatide  (MOUNJARO ) 2.5 MG/0.5ML Pen Stated pharmacy stated they need clarification of the directions  Spoke with the pharmacy to give them the clarification that they needed on the mounjaro 

## 2024-01-27 ENCOUNTER — Ambulatory Visit

## 2024-02-05 DIAGNOSIS — M47816 Spondylosis without myelopathy or radiculopathy, lumbar region: Secondary | ICD-10-CM | POA: Diagnosis not present

## 2024-02-13 ENCOUNTER — Telehealth: Payer: Self-pay

## 2024-02-13 NOTE — Telephone Encounter (Signed)
 Copied from CRM [id number]. Topic: Clinical - Prescription Issue >> Feb 13, 2024  2:16 PM Carlatta H wrote: Reason for CRM: tirzepatide  (MOUNJARO ) 2.5 MG/0.5ML Pen [[id number]] is making the patients stomach upset//She would like to know more information about switching to Trulicity// Patient would also like to 1 month supply as well//Please call to advise//  Please Advise

## 2024-02-18 NOTE — Telephone Encounter (Signed)
 LVM for pt regarding GI issues. Waiting on a response

## 2024-02-26 NOTE — Telephone Encounter (Signed)
 Pt would like a call back about below issues

## 2024-02-27 ENCOUNTER — Telehealth: Payer: Self-pay

## 2024-02-27 NOTE — Telephone Encounter (Signed)
 Copied from CRM [id number]. Topic: Clinical - Medication Question >> Feb 26, 2024  2:13 PM [place] A wrote: Reason for CRM: Patient would like to be prescribed Trulicity because Mounjaro  is making her ill-please call  Message sent to PCP

## 2024-03-17 ENCOUNTER — Ambulatory Visit: Payer: Self-pay

## 2024-03-17 NOTE — Telephone Encounter (Signed)
 FYI Only or Action Required?: FYI only for provider.  Patient was last seen in primary care on 01/13/2024 by Jodie Lavern CROME, MD.  Called Nurse Triage reporting cough - SOB with cough.  Symptoms began several days ago.  Interventions attempted: Nothing.  Symptoms are: stable.  Triage Disposition: See PCP Within 2 Weeks  Patient/caregiver understands and will follow disposition?: Yes                  Copied from CRM [id number]. Topic: Clinical - Red Word Triage >> Mar 17, 2024  9:49 AM Carlyon D wrote: Red Word that prompted transfer to Nurse Triage: Short of breath, fever, nasal drip, severe cough, Reason for Disposition  [1] MILD longstanding difficulty breathing (e.g., minimal/no SOB at rest, SOB with walking, pulse < 100) AND [2] SAME as normal  Answer Assessment - Initial Assessment Questions 1. RESPIRATORY STATUS: Describe your breathing? (e.g., wheezing, shortness of breath, unable to speak, severe coughing)      Coughing - sob 2. ONSET: When did this breathing problem begin?      Thursday 3. PATTERN Does the difficult breathing come and go, or has it been constant since it started?      Comes and goes with cough 4. SEVERITY: How bad is your breathing? (e.g., mild, moderate, severe)      mild 5. RECURRENT SYMPTOM: Have you had difficulty breathing before? If Yes, ask: When was the last time? and What happened that time?      yes 6. CARDIAC HISTORY: Do you have any history of heart disease? (e.g., heart attack, angina, bypass surgery, angioplasty)      no 7. LUNG HISTORY: Do you have any history of lung disease?  (e.g., pulmonary embolus, asthma, emphysema)     Hx of bronchitis 8. CAUSE: What do you think is causing the breathing problem?      URI 9. OTHER SYMPTOMS: Do you have any other symptoms? (e.g., chest pain, cough, dizziness, fever, runny nose)     Cough - runny nose  Protocols used: Breathing Difficulty-A-AH

## 2024-03-18 ENCOUNTER — Encounter: Payer: Self-pay | Admitting: Family Medicine

## 2024-03-18 ENCOUNTER — Ambulatory Visit (INDEPENDENT_AMBULATORY_CARE_PROVIDER_SITE_OTHER): Admitting: Family Medicine

## 2024-03-18 VITALS — BP 134/78 | HR 86 | Temp 97.3°F | Resp 18 | Ht 59.0 in | Wt 169.0 lb

## 2024-03-18 DIAGNOSIS — Z794 Long term (current) use of insulin: Secondary | ICD-10-CM | POA: Diagnosis not present

## 2024-03-18 DIAGNOSIS — J209 Acute bronchitis, unspecified: Secondary | ICD-10-CM | POA: Diagnosis not present

## 2024-03-18 DIAGNOSIS — E1165 Type 2 diabetes mellitus with hyperglycemia: Secondary | ICD-10-CM | POA: Diagnosis not present

## 2024-03-18 MED ORDER — AZITHROMYCIN 250 MG PO TABS
ORAL_TABLET | ORAL | 0 refills | Status: DC
Start: 2024-03-18 — End: 2024-03-23

## 2024-03-18 MED ORDER — PREDNISONE 20 MG PO TABS: 40.0000 mg | ORAL_TABLET | Freq: Every day | ORAL | 0 refills | Status: AC

## 2024-03-18 NOTE — Patient Instructions (Signed)
 Meds sent to pharmacy  Worse, let us  know or ER

## 2024-03-18 NOTE — Progress Notes (Signed)
 Subjective:     Patient ID: Marissa Thompson, female    DOB: 1950/11/15, 73 y.o.   MRN: [id number]  Chief Complaint  Patient presents with   Cough    Non-productive cough that started Friday Taking Mucinex  D and TheraFlu Cold    HPI Discussed the use of AI scribe software for clinical note transcription with the patient, who gave verbal consent to proceed.  History of Present Illness Marissa Thompson is a 73 year old female with bronchitis and diabetes who presents with a cough and chest cold.  Symptoms began on Thursday evening with a head cold, progressing to a chest cold by Friday. The cough is described as 'croupy' and non-productive. She experiences shortness of breath and has a history of bronchitis. Initially, she had a fever of 99.65F on Saturday, elevated from her baseline of 1F. She took Mucinex  D and Theraflu, and her fever went away. Body aches were present at the onset but have since resolved. No vomiting.  She uses her albuterol  inhaler three times a day. Her diabetes management includes metformin  and Toujeo . She previously tried Mounjaro  and Ozempic, which caused gastrointestinal side effects, and stopped Jardiance  due to cost and perceived lack of necessity. Blood sugars are around 130 mg/dL. Her last A1c was 7.6% in June, down from 7.8% in February.  She has had two colds this year, with the previous one in February also affecting her chest. She is allergic to penicillin and doxycycline , with penicillin causing a rash.    Health Maintenance Due  Topic Date Due   Zoster Vaccines- Shingrix  (1 of 2) Never done   DEXA SCAN  Never done   DTaP/Tdap/Td (3 - Td or Tdap) 12/11/2023   MAMMOGRAM  01/24/2024   INFLUENZA VACCINE  02/14/2024    Past Medical History:  Diagnosis Date   Allergy    Anxiety 2003   Carpal tunnel syndrome, bilateral    Cough    for over a year per pt-   Depression 1998   Diabetes mellitus without complication (HCC) 1998   Dupuytren's  contracture of left hand    DVT (deep venous thrombosis) (HCC) 2007   left calf x2   GERD (gastroesophageal reflux disease) 1980   History of alcoholism (HCC)    Chronic   HLD (hyperlipidemia)    Hyperlipidemia 1990   Hypertension    Hypothyroidism 1998   Macular degeneration 07/31/2018   2020   Macular degeneration 12/2019   PONV (postoperative nausea and vomiting)    1 time per pt in 2001   Short-term memory loss    SOB (shortness of breath) on exertion     Past Surgical History:  Procedure Laterality Date   ANTERIOR CERVICAL DISCECTOMY  02/2018   BACK SURGERY     x3 Lumbar   CARPAL TUNNEL RELEASE Left 03/08/2016   Procedure: LEFT CARPAL TUNNEL RELEASE;  Surgeon: Franky Curia, MD;  Location: [facility];  Service: Orthopedics;  Laterality: Left;   CATARACT EXTRACTION Bilateral 6/21-7/21   CERVICAL POLYPECTOMY N/A 08/17/2013   Procedure: CERVICAL POLYPECTOMY;  Surgeon: Charlie JONETTA Aho, MD;  Location: [facility];  Service: Gynecology;  Laterality: N/A;   COLON SURGERY  2007   benign mass   Dupuytren's release Left 2017   ECTOPIC PREGNANCY SURGERY  1979   ESOPHAGEAL DILATION     x3   EYE SURGERY     FOOT ARTHRODESIS Left 12/21/2021   Procedure: ARTHRODESIS LIS FRANC;  Surgeon: Janit Thresa CHRISTELLA,  DPM;  Location: [facility];  Service: Podiatry;  Laterality: Left;   HALLUX VALGUS LAPIDUS Left 12/21/2021   Procedure: HALLUX VALGUS LAPIDUS;  Surgeon: Janit Thresa HERO, DPM;  Location: [facility];  Service: Podiatry;  Laterality: Left;   HYSTEROSCOPY WITH D & C N/A 08/17/2013   Procedure: DILATATION AND CURETTAGE /HYSTEROSCOPY;  Surgeon: Charlie JONETTA Aho, MD;  Location: [facility];  Service: Gynecology;  Laterality: N/A;  YAG LASER   NECK SURGERY     c5-7 ACDF 3/15/has screws and plate in neck   TONSILLECTOMY     TRIGGER FINGER RELEASE Left 03/08/2016   Procedure: LEFT LONG TRIGGER RELEASE AND LEFT RING TRIGGER RELEASE;  Surgeon: Franky Curia, MD;  Location: [facility];   Service: Orthopedics;  Laterality: Left;   UPPER GASTROINTESTINAL ENDOSCOPY       Current Outpatient Medications:    aspirin  EC 81 MG tablet, Take 81 mg by mouth daily., Disp: , Rfl:    azithromycin  (ZITHROMAX ) 250 MG tablet, Take 2 tablets on day 1, then 1 tablet daily on days 2 through 5, Disp: 6 tablet, Rfl: 0   cyclobenzaprine  (FLEXERIL ) 10 MG tablet, Take 1 tablet (10 mg total) by mouth 3 (three) times daily as needed., Disp: 30 tablet, Rfl: 0   diphenhydrAMINE  HCl (BENADRYL  ALLERGY PO), Take by mouth., Disp: , Rfl:    gabapentin  (NEURONTIN ) 600 MG tablet, TAKE 1 TABLET BY MOUTH THREE TIMES A DAY, Disp: 270 tablet, Rfl: 3   levothyroxine  (SYNTHROID ) 88 MCG tablet, Take 1 tablet (88 mcg total) by mouth daily., Disp: 90 tablet, Rfl: 3   lisinopril  (ZESTRIL ) 10 MG tablet, Take 1 tablet (10 mg total) by mouth daily., Disp: 90 tablet, Rfl: 3   Melatonin 10 MG CAPS, Take 20 mg by mouth at bedtime., Disp: , Rfl:    metFORMIN  (GLUCOPHAGE ) 1000 MG tablet, TAKE 1 TABLET BY MOUTH TWICE A DAY WITH FOOD, Disp: 180 tablet, Rfl: 0   Omega-3 Fatty Acids (FISH OIL) 1000 MG CAPS, Take by mouth., Disp: , Rfl:    OVER THE COUNTER MEDICATION, Take 2 tablets by mouth daily. vision shield, Disp: , Rfl:    pantoprazole  (PROTONIX ) 40 MG tablet, TAKE 1 TABLET BY MOUTH EVERY DAY, Disp: 90 tablet, Rfl: 1   phenazopyridine  (PYRIDIUM ) 200 MG tablet, Take 1 tablet (200 mg total) by mouth 3 (three) times daily as needed for pain., Disp: 10 tablet, Rfl: 0   pravastatin  (PRAVACHOL ) 40 MG tablet, TAKE 1 TABLET BY MOUTH EVERYDAY AT BEDTIME, Disp: 90 tablet, Rfl: 2   predniSONE  (DELTASONE ) 20 MG tablet, Take 2 tablets (40 mg total) by mouth daily with breakfast for 5 days., Disp: 10 tablet, Rfl: 0   TOUJEO  SOLOSTAR 300 UNIT/ML Solostar Pen, Inject 40 Units into the skin daily after breakfast., Disp: 18 mL, Rfl: 3   traZODone  (DESYREL ) 50 MG tablet, TAKE 0.5-1 TABLETS BY MOUTH AT BEDTIME AS NEEDED FOR SLEEP., Disp: 90 tablet,  Rfl: 2  Allergies  Allergen Reactions   Toradol  [Ketorolac  Tromethamine ] Other (See Comments)    Slurred speech, confusion   Doxycycline  Nausea And Vomiting   Baclofen Other (See Comments)    Memory Loss and shakes   Morphine  And Codeine Itching   Penicillins Rash    Has patient had a PCN reaction causing immediate rash, facial/tongue/throat swelling, SOB or lightheadedness with hypotension: Yes Has patient had a PCN reaction causing severe rash involving mucus membranes or skin necrosis: No Has patient had a PCN reaction that required  hospitalization: No Has patient had a PCN reaction occurring within the last 10 years: No If all of the above answers are NO, then may proceed with Cephalosporin use.    Talwin [Pentazocine] Itching   ROS neg/noncontributory except as noted HPI/below      Objective:     BP 134/78   Pulse 86   Temp (!) 97.3 F (36.3 C) (Temporal)   Resp 18   Ht 4' 11 (1.499 m)   Wt 169 lb (76.7 kg)   SpO2 97%   BMI 34.13 kg/m  Wt Readings from Last 3 Encounters:  03/18/24 169 lb (76.7 kg)  01/15/24 167 lb (75.8 kg)  01/13/24 167 lb 9.6 oz (76 kg)    Physical Exam   Gen: WDWN NAD HEENT: NCAT, conjunctiva not injected, sclera nonicteric TM WNL B, OP moist, no exudates  NECK:  supple, no thyromegaly, no nodes, CARDIAC: RRR, S1S2+, no murmur.  LUNGS: CTAB. No wheezes. Barky cough EXT:  no edema MSK: no gross abnormalities.  NEURO: A&O x3.  CN II-XII intact.  PSYCH: normal mood. Good eye contact  Reviewed labs     Assessment & Plan:  Acute bronchitis, unspecified organism  Uncontrolled type 2 diabetes mellitus with hyperglycemia, with long-term current use of insulin  (HCC)  Other orders -     Azithromycin ; Take 2 tablets on day 1, then 1 tablet daily on days 2 through 5  Dispense: 6 tablet; Refill: 0 -     predniSONE ; Take 2 tablets (40 mg total) by mouth daily with breakfast for 5 days.  Dispense: 10 tablet; Refill: 0  Assessment and  Plan Assessment & Plan Acute bronchitis   She presents with acute bronchitis, characterized by a croupy cough and shortness of breath, following a head cold that progressed to the chest. The differential diagnosis includes viral versus bacterial etiology. Her history of bronchitis and diabetes increases the risk for complications. She is allergic to penicillin and doxycycline , with a rash reaction to penicillin. Previous treatment with Z-Pak and steroids was effective. Prescribe prednisone , monitoring for potential hyperglycemia, and adjust Toujeo  insulin  as needed. Advise using an albuterol  inhaler up to three times a day. Instruct to seek emergency care if symptoms worsen.  Type 2 diabetes mellitus   She has type 2 diabetes mellitus with a recent A1c of 7.6, down from 7.8 in February. Current blood glucose levels are around 130 mg/dL. She discontinued Jardiance  due to cost and perceived lack of need but should discuss its renal protective benefits with her PCP. Continue metformin  and Toujeo  insulin . Monitor blood glucose levels, especially with prednisone  use.    Return if symptoms worsen or fail to improve.  Jenkins CHRISTELLA Carrel, MD

## 2024-03-23 ENCOUNTER — Ambulatory Visit (INDEPENDENT_AMBULATORY_CARE_PROVIDER_SITE_OTHER): Admitting: Family Medicine

## 2024-03-23 ENCOUNTER — Encounter: Payer: Self-pay | Admitting: Family Medicine

## 2024-03-23 VITALS — BP 128/72 | HR 95 | Temp 97.7°F | Ht 59.0 in | Wt 167.2 lb

## 2024-03-23 DIAGNOSIS — Z794 Long term (current) use of insulin: Secondary | ICD-10-CM | POA: Diagnosis not present

## 2024-03-23 DIAGNOSIS — Z1231 Encounter for screening mammogram for malignant neoplasm of breast: Secondary | ICD-10-CM

## 2024-03-23 DIAGNOSIS — E1165 Type 2 diabetes mellitus with hyperglycemia: Secondary | ICD-10-CM | POA: Diagnosis not present

## 2024-03-23 DIAGNOSIS — Z23 Encounter for immunization: Secondary | ICD-10-CM

## 2024-03-23 DIAGNOSIS — E1159 Type 2 diabetes mellitus with other circulatory complications: Secondary | ICD-10-CM

## 2024-03-23 DIAGNOSIS — F339 Major depressive disorder, recurrent, unspecified: Secondary | ICD-10-CM | POA: Diagnosis not present

## 2024-03-23 DIAGNOSIS — Z780 Asymptomatic menopausal state: Secondary | ICD-10-CM

## 2024-03-23 DIAGNOSIS — I152 Hypertension secondary to endocrine disorders: Secondary | ICD-10-CM

## 2024-03-23 LAB — POCT GLYCOSYLATED HEMOGLOBIN (HGB A1C): Hemoglobin A1C: 8.3 % — AB (ref 4.0–5.6)

## 2024-03-23 MED ORDER — EMPAGLIFLOZIN 25 MG PO TABS: 25.0000 mg | ORAL_TABLET | Freq: Every day | ORAL | 3 refills | Status: AC

## 2024-03-23 MED ORDER — GUAIFENESIN-CODEINE 100-10 MG/5ML PO SOLN: 5.0000 mL | Freq: Four times a day (QID) | ORAL | 0 refills | Status: AC | PRN

## 2024-03-23 NOTE — Progress Notes (Signed)
 Subjective  CC:  Chief Complaint  Patient presents with   Diabetes   Hypertension   Cough    Pt stated that she took all her medication for the cough it has not gone yet    HPI: Marissa Thompson is a 73 y.o. female who presents to the office today for follow up of diabetes and problems listed above in the chief complaint.  Discussed the use of AI scribe software for clinical note transcription with the patient, who gave verbal consent to proceed.  History of Present Illness Marissa Thompson is a 73 year old female with diabetes who presents with a persistent cough and elevated blood sugar levels. I reviewed note from 09/03: dxd with bronchitis treated with zpak and pred.   She has been experiencing a persistent cough that has slightly improved, noting that it 'isn't as deep' but her head remains 'filled up with junk.' She completed a course of antibiotics and has been using an albuterol  inhaler. Prednisone  was started but discontinued due to elevated blood sugar levels. No fevers have been reported.  Her diabetes management has been challenging, with her A1c rising to 8.3. She previously tried Ozempic and Mounjaro , both of which caused nausea and discomfort. She took two shots of Mounjaro  but experienced immediate side effects. She has not been on Jardiance  for a long time due to cost. Her diet currently lacks structure, and she consumes 'junk' like pretzels and wheat thins, which she gums due to missing bottom dentures. She eats oatmeal or Cheerios with banana for breakfast and does not eat again until the afternoon. She takes mounjaro  and met 1000 bid.   She is currently taking lisinopril  10 mg for blood pressure management. She is also on trazodone  for sleep, though she experiences occasional sleepless nights.she reports normal bp at home.  She is awaiting resolution of probate issues, which she finds upsetting. She has not had a bone density test due to scheduling issues and is due for  a mammogram. She reports feeling tired, which she attributes to her uncontrolled diabetes.    Wt Readings from Last 3 Encounters:  03/23/24 167 lb 3.2 oz (75.8 kg)  03/18/24 169 lb (76.7 kg)  01/15/24 167 lb (75.8 kg)    BP Readings from Last 3 Encounters:  03/23/24 128/72  03/18/24 134/78  01/13/24 138/75    Assessment  1. Uncontrolled type 2 diabetes mellitus with hyperglycemia, with long-term current use of insulin  (HCC)   2. Need for influenza vaccination   3. Hypertension associated with diabetes (HCC)   4. Major depression, recurrent, chronic (HCC)   5. Screening mammogram for breast cancer   6. Asymptomatic menopausal state      Plan  Assessment and Plan Assessment & Plan Cough and upper respiratory symptoms, recent infection Recent upper respiratory infection with persistent cough and head congestion. Symptoms improving but not fully resolved. - Order Robitussin with codeine  for cough management. - Recommend continued use of Mucinex  DM for daytime symptom relief.  Type 2 diabetes mellitus, uncontrolled A1c increased to 8.3%. Previous medications caused gastrointestinal side effects. Discussed alternative medication options. She prefers to restart Jardiance  with pharmacy assistance due to cost concerns. Current diet includes high carbohydrate snacks, contributing to poor glycemic control. Discussed the need for dietary modifications. - Refer to pharmacist for patient assistance program for Jardiance . - Advise dietary modifications to reduce processed carbohydrates and incorporate healthier snacks such as non-sugared applesauce, yogurt, smoothies with kale and berries, and cottage cheese with  limited pineapple. Continue met and toujeo   Hypertension Blood pressure readings at home are variable. Current medication is lisinopril  10 mg. Office reading is 130s/80s, slightly elevated. Reports some improvement at home. - Instruct to monitor and record home blood pressure  readings.  Depression, stable Mood is stable off medications except for trazodone  for sleep. Occasional insomnia reported, possibly related to stress from personal issues.  General Health Maintenance Due for mammogram and bone density screening. Eligible for flu and shingles vaccinations. - Order mammogram and bone density screening. - Administer flu shot today. - Recommend shingles vaccination at pharmacy.    Follow up: 3 mo for cpe and f/u Orders Placed This Encounter  Procedures   MM DIGITAL SCREENING BILATERAL   DG Bone Density   Flu vaccine HIGH DOSE PF(Fluzone Trivalent)   Amb Referral to Clinical Pharmacist   POCT HgB A1C   Meds ordered this encounter  Medications   guaiFENesin -codeine  100-10 MG/5ML syrup    Sig: Take 5 mLs by mouth every 6 (six) hours as needed for cough.    Dispense:  120 mL    Refill:  0   empagliflozin  (JARDIANCE ) 25 MG TABS tablet    Sig: Take 1 tablet (25 mg total) by mouth daily.    Dispense:  90 tablet    Refill:  3      Immunization History  Administered Date(s) Administered   Fluad Quad(high Dose 65+) 03/05/2019, 04/04/2020   Fluad Trivalent(High Dose 65+) 03/31/2023   INFLUENZA, HIGH DOSE SEASONAL PF 08/29/2017, 04/03/2018   Influenza-Unspecified 03/16/2013   PFIZER(Purple Top)SARS-COV-2 Vaccination 08/08/2019, 09/05/2019, 05/09/2020, 02/16/2021   PPD Test 02/08/2014, 03/09/2015, 10/24/2015, 06/19/2016, 09/28/2019   Pneumococcal Conjugate-13 08/29/2017   Pneumococcal Polysaccharide-23 07/17/2011, 07/31/2018   Td 07/17/2003   Tdap 12/10/2013    Diabetes Related Lab Review: Lab Results  Component Value Date   HGBA1C 8.3 (A) 03/23/2024   HGBA1C 7.6 (A) 01/13/2024   HGBA1C 7.8 (H) 09/09/2023    Lab Results  Component Value Date   MICROALBUR <0.7 09/09/2023   Lab Results  Component Value Date   CREATININE 0.72 09/09/2023   BUN 12 09/09/2023   NA 137 09/09/2023   K 4.2 09/09/2023   CL 98 09/09/2023   CO2 28 09/09/2023    Lab Results  Component Value Date   CHOL 149 09/09/2023   CHOL 142 10/02/2021   CHOL 161 10/11/2020   Lab Results  Component Value Date   HDL 82.40 09/09/2023   HDL 70.80 10/02/2021   HDL 66.90 10/11/2020   Lab Results  Component Value Date   LDLCALC 40 09/09/2023   LDLCALC 35 10/02/2021   LDLCALC 62 09/16/2019   Lab Results  Component Value Date   TRIG 134.0 09/09/2023   TRIG 179.0 (H) 10/02/2021   TRIG 253.0 (H) 10/11/2020   Lab Results  Component Value Date   CHOLHDL 2 09/09/2023   CHOLHDL 2 10/02/2021   CHOLHDL 2 10/11/2020   Lab Results  Component Value Date   LDLDIRECT 61.0 10/11/2020   The 10-year ASCVD risk score (Arnett DK, et al., 2019) is: 22.4%   Values used to calculate the score:     Age: 28 years     Clincally relevant sex: Female     Is Non-Hispanic African American: No     Diabetic: Yes     Tobacco smoker: No     Systolic Blood Pressure: 128 mmHg     Is BP treated: Yes     HDL Cholesterol: 82.4  mg/dL     Total Cholesterol: 149 mg/dL I have reviewed the PMH, Fam and Soc history. Patient Active Problem List   Diagnosis Date Noted   Major depression, recurrent, chronic (HCC) 08/29/2017    Priority: High    Had been on zoloft  for years: changed to paxil  2018 with good results. Added wellbutrin  07/2018 with good results Then prozac . Stopped 2025 due to drowsiness. 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     Priority: Medium    Dupuytren's contracture of left hand 12/21/2015    Priority: Medium    Dysphagia, pharyngoesophageal phase 12/29/2014    Priority: Medium     Nl EGD 2019    GERD (gastroesophageal reflux disease)     Priority: Medium    Seborrheic dermatitis of scalp 05/13/2018    Priority: Low   Eczema of both external ears 12/19/2017    Priority: Low   Vitamin D  deficiency 03/03/2014    Priority: Low   Allergy     Priority: Low   Cellulitis of left foot 01/10/2022   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 She has never used smokeless tobacco. She reports that she does not currently use alcohol. She reports that she does not use drugs.  Review of Systems: Ophthalmic: negative for eye pain, loss of vision or double vision Cardiovascular: negative for chest pain Respiratory: negative for SOB or persistent cough Gastrointestinal: negative for abdominal pain Genitourinary: negative for dysuria or gross hematuria MSK: negative for foot lesions Neurologic: negative for weakness or gait disturbance  Objective  Vitals: BP 128/72 Comment: pt reported  Pulse 95   Temp 97.7 F (36.5 C)   Ht 4' 11 (1.499 m)   Wt 167 lb 3.2 oz (75.8 kg)   SpO2 97%   BMI 33.77 kg/m  General: well appearing, no acute distress but harsh cough present. Psych:  Alert and oriented, normal mood and affect HEENT:  Normocephalic, atraumatic, moist mucous membranes, supple neck  Cardiovascular:  Nl S1 and S2, RRR without murmur, gallop or rub. no edema Respiratory:  Good breath sounds bilaterally, CTAB with normal effort, no rales Gastrointestinal: normal BS, soft, nontender   Diabetic education: ongoing education regarding chronic disease management for diabetes was given today. We continue to reinforce the ABC's of diabetic management: A1c (<7 or 8 dependent upon patient), tight blood pressure  control, and cholesterol management with goal LDL < 100 minimally. We discuss diet strategies, exercise recommendations, medication options and possible side effects. At each visit, we review recommended immunizations and preventive care recommendations for diabetics and stress that good diabetic control can prevent other problems. See below for this patient's data. Commons side effects, risks, benefits, and alternatives for medications and treatment plan prescribed today were discussed,  and the patient expressed understanding of the given instructions. Patient is instructed to call or message via MyChart if he/she has any questions or concerns regarding our treatment plan. No barriers to understanding were identified. We discussed Red Flag symptoms and signs in detail. Patient expressed understanding regarding what to do in case of urgent or emergency type symptoms.  Medication list was reconciled, printed and provided to the patient in AVS. Patient instructions and summary information was reviewed with the patient as documented in the AVS. This note was prepared with assistance of Dragon voice recognition software. Occasional wrong-word or sound-a-like substitutions may have occurred due to the inherent limitations of voice recognition software

## 2024-03-23 NOTE — Patient Instructions (Addendum)
 Please return in 3 months for your annual complete physical; please come fasting.  For follow up on chronic medical conditions   If you have any questions or concerns, please don't hesitate to send me a message via MyChart or call the office at [phone]. Thank you for visiting with us  today! It's our pleasure caring for you.   Please call the office checked below to schedule your appointment for your mammogram and/or bone density screen (the checked studies were ordered): [x]   Mammogram  [x]   Bone Density  [x]   [facility]     [address] [place], [place]        [phone]         [x]   [facility]  [address] [place], [place]  [phone]    VISIT SUMMARY: During today's visit, we discussed your persistent cough, elevated blood sugar levels, and overall health maintenance. We reviewed your current medications and made some adjustments to better manage your symptoms and improve your health.  YOUR PLAN: -COUGH AND UPPER RESPIRATORY SYMPTOMS: You have a recent upper respiratory infection that has caused a persistent cough and head congestion. Although your symptoms are improving, they are not fully resolved. We will prescribe Robitussin with codeine  to help manage your cough and recommend you continue using Mucinex  DM during the day for symptom relief.  -TYPE 2 DIABETES MELLITUS, UNCONTROLLED: Your blood sugar levels are higher than desired, with an A1c of 8.3%. Previous medications caused side effects, so we discussed restarting Jardiance  with assistance from the pharmacy due to cost concerns. We also talked about making dietary changes to reduce processed carbohydrates and include healthier snacks like non-sugared applesauce, yogurt, smoothies with kale and berries, and cottage cheese with limited pineapple.  -HYPERTENSION: Your blood pressure readings at home have been variable, and today's office reading was slightly elevated at  130s/80s. You are currently taking lisinopril  10 mg. We recommend you monitor and record your blood pressure readings at home regularly.  -DEPRESSION, STABLE: Your mood is stable with the use of trazodone  for sleep, although you occasionally experience insomnia, possibly due to stress from personal issues.  -GENERAL HEALTH MAINTENANCE: You are due for a mammogram and bone density screening. We will order these tests for you. You are also eligible for flu and shingles vaccinations; we will administer the flu shot today and recommend you get the shingles vaccination at the pharmacy.  INSTRUCTIONS: Please follow up with the pharmacist for assistance with Jardiance . Monitor and record your blood pressure readings at home. Schedule your mammogram and bone density screening as soon as possible. Get the shingles vaccination at your local pharmacy.                      Contains text generated by [employer]bridge.                                 Contains text generated by [employer]bridge.

## 2024-03-30 ENCOUNTER — Other Ambulatory Visit: Payer: Self-pay | Admitting: Family Medicine

## 2024-03-30 DIAGNOSIS — E1165 Type 2 diabetes mellitus with hyperglycemia: Secondary | ICD-10-CM

## 2024-04-03 ENCOUNTER — Telehealth: Payer: Self-pay | Admitting: *Deleted

## 2024-04-03 NOTE — Progress Notes (Signed)
 Care Guide Pharmacy Note  04/03/2024 Name: LAVONE BARRIENTES MRN: [id number] DOB: 1950/08/29  Referred By: Jodie Lavern CROME, MD Reason for referral: Complex Care Management (Outreach to schedule referral with pharmacist )   TRUDE CANSLER is a 73 y.o. year old female who is a primary care patient of Jodie Lavern CROME, MD.  JEMINA SCAHILL was referred to the pharmacist for assistance related to: DMII  Pt declines need for med assistance with Jardiance  - says she can afford for now. Contact info given if services needed in the future   Thedford Franks, CMA [place]  [facility] Guide Direct Dial: [phone]  Fax: [phone] Website: [email or web address]

## 2024-04-07 ENCOUNTER — Encounter: Payer: Self-pay | Admitting: Family Medicine

## 2024-04-15 ENCOUNTER — Ambulatory Visit: Admitting: Family Medicine

## 2024-05-01 ENCOUNTER — Other Ambulatory Visit: Payer: Self-pay | Admitting: Family Medicine

## 2024-05-01 DIAGNOSIS — E1149 Type 2 diabetes mellitus with other diabetic neurological complication: Secondary | ICD-10-CM

## 2024-05-10 ENCOUNTER — Other Ambulatory Visit: Payer: Self-pay | Admitting: Family Medicine

## 2024-06-17 LAB — HM MAMMOGRAPHY

## 2024-06-17 LAB — HM DEXA SCAN

## 2024-06-18 ENCOUNTER — Encounter: Payer: Self-pay | Admitting: Family Medicine

## 2024-06-21 ENCOUNTER — Ambulatory Visit: Payer: Self-pay | Admitting: Family Medicine

## 2024-06-21 NOTE — Progress Notes (Signed)
 See mychart note.   Marissa Thompson, Thank you for getting your bone density screening test done. I have reviewed the results. Your results show low bone density increasing your risk of fracture. Please schedule a visit to discuss treatment options.   Sincerely, Dr. Jodie

## 2024-06-22 ENCOUNTER — Ambulatory Visit: Admitting: Family Medicine

## 2024-06-25 ENCOUNTER — Encounter: Payer: Self-pay | Admitting: Family Medicine

## 2024-06-25 ENCOUNTER — Ambulatory Visit: Payer: Self-pay

## 2024-06-25 NOTE — Telephone Encounter (Signed)
 FYI Only or Action Required?: FYI only for provider: Pt going to UC.  Patient was last seen in primary care on 03/23/2024 by Jodie Lavern CROME, MD.  Called Nurse Triage reporting Dysuria.  Symptoms began yesterday.  Interventions attempted: OTC medications: AZO.  Symptoms are: gradually worsening.  Triage Disposition: See Physician Within 24 Hours  Patient/caregiver understands and will follow disposition?: Yes Reason for Disposition  Age > 50 years  Answer Assessment - Initial Assessment Questions Pt reports burning with and without urination. Took AZO. Pt opted for same day UC close to her home. Advised to call back if symptoms do not resolve or if they resolve and return.   1. ONSET: When did the painful urination start?      06/24/24 2. FEVER: Do you have a fever? If Yes, ask: What is your temperature, how was it measured, and when did it start?     Denies 3. PAST UTI: Have you had a urine infection before? If Yes, ask: When was the last time? and What happened that time?      June 2025 4. CAUSE: What do you think is causing the painful urination?  (e.g., UTI, scratch, Herpes sore)     UTI 5. OTHER SYMPTOMS: Do you have any other symptoms? (e.g., blood in urine, flank pain, genital sores, urgency, vaginal discharge)     Denies flank pain or hematuria  Protocols used: Urination Pain - [facility] Copied from CRM #[id number]. Topic: Clinical - Red Word Triage >> Jun 25, 2024 10:44 AM Tinnie BROCKS wrote: Red Word that prompted transfer to Nurse Triage: Pt calling [facility] regarding pain with urination, thinks she has UTI, no other sxs. Requesting call at [phone]

## 2024-06-25 NOTE — Telephone Encounter (Signed)
 Noted

## 2024-07-01 ENCOUNTER — Ambulatory Visit: Admitting: Family Medicine

## 2024-07-02 ENCOUNTER — Other Ambulatory Visit

## 2024-07-08 ENCOUNTER — Other Ambulatory Visit: Payer: Self-pay | Admitting: Family

## 2024-07-23 ENCOUNTER — Other Ambulatory Visit: Payer: Self-pay | Admitting: Family Medicine

## 2024-07-23 DIAGNOSIS — K219 Gastro-esophageal reflux disease without esophagitis: Secondary | ICD-10-CM

## 2024-08-26 ENCOUNTER — Ambulatory Visit: Admitting: Family Medicine

## 2025-01-19 ENCOUNTER — Ambulatory Visit
